# Patient Record
Sex: Female | Born: 1950 | Race: White | Hispanic: No | Marital: Married | Smoking: Never smoker
Health system: Southern US, Community
[De-identification: ages and names within clinical notes are randomized; demographics above are authoritative.]

## PROBLEM LIST (undated history)

## (undated) DIAGNOSIS — T7840XA Allergy, unspecified, initial encounter: Secondary | ICD-10-CM

## (undated) DIAGNOSIS — Z8709 Personal history of other diseases of the respiratory system: Secondary | ICD-10-CM

## (undated) DIAGNOSIS — I499 Cardiac arrhythmia, unspecified: Secondary | ICD-10-CM

## (undated) DIAGNOSIS — R946 Abnormal results of thyroid function studies: Secondary | ICD-10-CM

## (undated) DIAGNOSIS — R112 Nausea with vomiting, unspecified: Secondary | ICD-10-CM

## (undated) DIAGNOSIS — M722 Plantar fascial fibromatosis: Secondary | ICD-10-CM

## (undated) DIAGNOSIS — R011 Cardiac murmur, unspecified: Secondary | ICD-10-CM

## (undated) DIAGNOSIS — J189 Pneumonia, unspecified organism: Secondary | ICD-10-CM

## (undated) DIAGNOSIS — M5030 Other cervical disc degeneration, unspecified cervical region: Secondary | ICD-10-CM

## (undated) DIAGNOSIS — K2270 Barrett's esophagus without dysplasia: Secondary | ICD-10-CM

## (undated) DIAGNOSIS — I2699 Other pulmonary embolism without acute cor pulmonale: Secondary | ICD-10-CM

## (undated) DIAGNOSIS — K219 Gastro-esophageal reflux disease without esophagitis: Secondary | ICD-10-CM

## (undated) DIAGNOSIS — Z9889 Other specified postprocedural states: Secondary | ICD-10-CM

## (undated) DIAGNOSIS — M1990 Unspecified osteoarthritis, unspecified site: Secondary | ICD-10-CM

## (undated) DIAGNOSIS — I480 Paroxysmal atrial fibrillation: Secondary | ICD-10-CM

## (undated) DIAGNOSIS — D649 Anemia, unspecified: Secondary | ICD-10-CM

## (undated) DIAGNOSIS — R7303 Prediabetes: Secondary | ICD-10-CM

## (undated) DIAGNOSIS — S62109A Fracture of unspecified carpal bone, unspecified wrist, initial encounter for closed fracture: Secondary | ICD-10-CM

## (undated) DIAGNOSIS — Z8719 Personal history of other diseases of the digestive system: Secondary | ICD-10-CM

## (undated) DIAGNOSIS — Z86010 Personal history of colonic polyps: Secondary | ICD-10-CM

## (undated) DIAGNOSIS — E669 Obesity, unspecified: Secondary | ICD-10-CM

## (undated) DIAGNOSIS — Z5189 Encounter for other specified aftercare: Secondary | ICD-10-CM

## (undated) DIAGNOSIS — I219 Acute myocardial infarction, unspecified: Secondary | ICD-10-CM

## (undated) HISTORY — DX: Fracture of unspecified carpal bone, unspecified wrist, initial encounter for closed fracture: S62.109A

## (undated) HISTORY — DX: Obesity, unspecified: E66.9

## (undated) HISTORY — PX: PLANTAR FASCIA RELEASE: SHX2239

## (undated) HISTORY — DX: Personal history of colonic polyps: Z86.010

## (undated) HISTORY — DX: Paroxysmal atrial fibrillation: I48.0

## (undated) HISTORY — DX: Unspecified osteoarthritis, unspecified site: M19.90

## (undated) HISTORY — DX: Other pulmonary embolism without acute cor pulmonale: I26.99

## (undated) HISTORY — DX: Allergy, unspecified, initial encounter: T78.40XA

## (undated) HISTORY — DX: Abnormal results of thyroid function studies: R94.6

## (undated) HISTORY — DX: Barrett's esophagus without dysplasia: K22.70

## (undated) HISTORY — DX: Plantar fascial fibromatosis: M72.2

## (undated) HISTORY — PX: PULMONARY EMBOLISM SURGERY: SHX752

## (undated) HISTORY — DX: Cardiac murmur, unspecified: R01.1

## (undated) HISTORY — PX: OTHER SURGICAL HISTORY: SHX169

## (undated) HISTORY — DX: Anemia, unspecified: D64.9

## (undated) HISTORY — PX: CHOLECYSTECTOMY: SHX55

## (undated) HISTORY — DX: Encounter for other specified aftercare: Z51.89

## (undated) HISTORY — PX: BACK SURGERY: SHX140

## (undated) HISTORY — PX: TONSILLECTOMY: SUR1361

## (undated) HISTORY — DX: Other cervical disc degeneration, unspecified cervical region: M50.30

## (undated) HISTORY — PX: COLONOSCOPY: SHX174

## (undated) HISTORY — PX: ULNAR TUNNEL RELEASE: SHX820

## (undated) HISTORY — PX: VENOUS ABLATION: SHX2656

## (undated) HISTORY — PX: ESOPHAGOGASTRODUODENOSCOPY: SHX1529

## (undated) HISTORY — PX: ABDOMINAL HYSTERECTOMY: SHX81

---

## 1999-07-04 ENCOUNTER — Emergency Department (HOSPITAL_COMMUNITY): Admission: EM | Admit: 1999-07-04 | Discharge: 1999-07-04 | Payer: Self-pay | Admitting: Emergency Medicine

## 2002-03-01 ENCOUNTER — Ambulatory Visit (HOSPITAL_COMMUNITY): Admission: RE | Admit: 2002-03-01 | Discharge: 2002-03-01 | Payer: Self-pay | Admitting: *Deleted

## 2002-03-01 ENCOUNTER — Encounter (INDEPENDENT_AMBULATORY_CARE_PROVIDER_SITE_OTHER): Payer: Self-pay | Admitting: *Deleted

## 2002-03-01 DIAGNOSIS — Z86010 Personal history of colonic polyps: Secondary | ICD-10-CM

## 2002-03-01 DIAGNOSIS — Z860101 Personal history of adenomatous and serrated colon polyps: Secondary | ICD-10-CM

## 2002-03-01 HISTORY — DX: Personal history of colonic polyps: Z86.010

## 2002-03-01 HISTORY — DX: Personal history of adenomatous and serrated colon polyps: Z86.0101

## 2002-03-24 ENCOUNTER — Other Ambulatory Visit: Admission: RE | Admit: 2002-03-24 | Discharge: 2002-03-24 | Payer: Self-pay | Admitting: *Deleted

## 2002-09-02 ENCOUNTER — Encounter: Payer: Self-pay | Admitting: Gynecology

## 2002-09-05 ENCOUNTER — Ambulatory Visit: Admission: RE | Admit: 2002-09-05 | Discharge: 2002-09-05 | Payer: Self-pay | Admitting: Gynecology

## 2002-12-09 ENCOUNTER — Encounter (INDEPENDENT_AMBULATORY_CARE_PROVIDER_SITE_OTHER): Payer: Self-pay | Admitting: Specialist

## 2002-12-09 ENCOUNTER — Encounter: Payer: Self-pay | Admitting: General Surgery

## 2002-12-09 ENCOUNTER — Inpatient Hospital Stay (HOSPITAL_COMMUNITY): Admission: RE | Admit: 2002-12-09 | Discharge: 2002-12-12 | Payer: Self-pay | Admitting: General Surgery

## 2002-12-15 ENCOUNTER — Encounter: Payer: Self-pay | Admitting: *Deleted

## 2002-12-15 ENCOUNTER — Observation Stay (HOSPITAL_COMMUNITY): Admission: AD | Admit: 2002-12-15 | Discharge: 2002-12-16 | Payer: Self-pay | Admitting: *Deleted

## 2002-12-16 ENCOUNTER — Encounter (INDEPENDENT_AMBULATORY_CARE_PROVIDER_SITE_OTHER): Payer: Self-pay | Admitting: Specialist

## 2003-01-06 ENCOUNTER — Encounter: Payer: Self-pay | Admitting: Gynecology

## 2003-01-06 ENCOUNTER — Ambulatory Visit (HOSPITAL_COMMUNITY): Admission: RE | Admit: 2003-01-06 | Discharge: 2003-01-06 | Payer: Self-pay | Admitting: Gynecology

## 2003-03-24 ENCOUNTER — Other Ambulatory Visit: Admission: RE | Admit: 2003-03-24 | Discharge: 2003-03-24 | Payer: Self-pay | Admitting: Gynecology

## 2003-05-01 ENCOUNTER — Emergency Department (HOSPITAL_COMMUNITY): Admission: EM | Admit: 2003-05-01 | Discharge: 2003-05-01 | Payer: Self-pay | Admitting: *Deleted

## 2003-05-26 ENCOUNTER — Encounter: Admission: RE | Admit: 2003-05-26 | Discharge: 2003-05-26 | Payer: Self-pay | Admitting: Sports Medicine

## 2003-06-16 ENCOUNTER — Encounter: Admission: RE | Admit: 2003-06-16 | Discharge: 2003-06-16 | Payer: Self-pay | Admitting: Sports Medicine

## 2003-07-07 ENCOUNTER — Encounter: Admission: RE | Admit: 2003-07-07 | Discharge: 2003-07-07 | Payer: Self-pay | Admitting: Sports Medicine

## 2004-01-16 ENCOUNTER — Inpatient Hospital Stay (HOSPITAL_COMMUNITY): Admission: RE | Admit: 2004-01-16 | Discharge: 2004-01-21 | Payer: Self-pay | Admitting: Neurosurgery

## 2004-01-24 ENCOUNTER — Observation Stay (HOSPITAL_COMMUNITY): Admission: EM | Admit: 2004-01-24 | Discharge: 2004-01-25 | Payer: Self-pay | Admitting: Emergency Medicine

## 2004-05-03 ENCOUNTER — Encounter: Admission: RE | Admit: 2004-05-03 | Discharge: 2004-08-01 | Payer: Self-pay | Admitting: Neurosurgery

## 2005-10-11 ENCOUNTER — Encounter: Admission: RE | Admit: 2005-10-11 | Discharge: 2005-10-11 | Payer: Self-pay | Admitting: Family Medicine

## 2006-01-18 ENCOUNTER — Encounter: Admission: RE | Admit: 2006-01-18 | Discharge: 2006-01-18 | Payer: Self-pay | Admitting: Family Medicine

## 2006-07-28 ENCOUNTER — Ambulatory Visit: Payer: Self-pay | Admitting: Vascular Surgery

## 2006-08-03 ENCOUNTER — Ambulatory Visit: Payer: Self-pay | Admitting: Vascular Surgery

## 2006-08-25 ENCOUNTER — Ambulatory Visit: Payer: Self-pay | Admitting: Vascular Surgery

## 2006-08-31 ENCOUNTER — Ambulatory Visit: Payer: Self-pay | Admitting: Vascular Surgery

## 2006-10-05 ENCOUNTER — Ambulatory Visit: Payer: Self-pay | Admitting: Vascular Surgery

## 2007-01-07 ENCOUNTER — Encounter: Admission: RE | Admit: 2007-01-07 | Discharge: 2007-01-07 | Payer: Self-pay | Admitting: Orthopaedic Surgery

## 2007-01-19 ENCOUNTER — Inpatient Hospital Stay (HOSPITAL_COMMUNITY): Admission: RE | Admit: 2007-01-19 | Discharge: 2007-01-24 | Payer: Self-pay | Admitting: Orthopaedic Surgery

## 2007-02-15 ENCOUNTER — Encounter: Admission: RE | Admit: 2007-02-15 | Discharge: 2007-05-05 | Payer: Self-pay | Admitting: Orthopaedic Surgery

## 2008-06-30 ENCOUNTER — Ambulatory Visit: Payer: Self-pay | Admitting: Vascular Surgery

## 2008-09-29 ENCOUNTER — Ambulatory Visit: Payer: Self-pay | Admitting: Vascular Surgery

## 2008-12-12 ENCOUNTER — Encounter: Admission: RE | Admit: 2008-12-12 | Discharge: 2008-12-12 | Payer: Self-pay | Admitting: Orthopaedic Surgery

## 2009-02-16 ENCOUNTER — Encounter: Admission: RE | Admit: 2009-02-16 | Discharge: 2009-02-16 | Payer: Self-pay | Admitting: Orthopaedic Surgery

## 2009-07-31 ENCOUNTER — Ambulatory Visit: Payer: Self-pay | Admitting: General Practice

## 2010-09-03 NOTE — Assessment & Plan Note (Signed)
OFFICE VISIT   [name], [name]  DOB:  1950/08/27                                       10/05/2006  ZOXWR#:[id number]   The patient presents today for final followup of her staged bilateral  laser ablation in great saphenous vein.  She is pleased with her overall  results.  She reports that she has less discomfort at the end of a day  and is able to see her ankles for the first time in quite some time.  She does have some tenderness over the medial aspect of her calf  bilaterally, and reports that she has had sensation of what she  describes as reticular vein in the past, but these are no longer  visualized by her.   On physical exam, has complete resolution of all her bruising and does  have diminished swelling.  She is encouraged to wear compression  garments when possible, and will see [place] on an as-needed basis.   Larina Earthly, M.D.  Electronically Signed   TFE/MEDQ  D:  10/05/2006  T:  10/06/2006  Job:  [id number]   cc:   Caryn Bee L. Little, M.D.

## 2010-09-03 NOTE — Procedures (Signed)
LOWER EXTREMITY VENOUS REFLUX EXAM   INDICATION:  Left leg superficial thrombophlebitis.  Previous bilateral  greater saphenous vein ablation.   EXAM:  Using color-flow imaging and pulse Doppler spectral analysis, the  right and left common femoral, superficial femoral, popliteal, posterior  tibial, greater and lesser saphenous veins are evaluated.  There is no  evidence suggesting deep venous insufficiency in the left lower  extremity.  There is mild reflux seen in the right common femoral vein.   The left saphenofemoral junction is not competent.  The left GSV  anterior branch is not competent with the caliber as described below.   The left proximal short saphenous vein demonstrates competency.   GSV Diameter (used if found to be incompetent only)                                            Right    Left  Proximal Greater Saphenous Vein           cm       1.1 cm  Proximal-to-mid-thigh                     cm       0.6 cm  Mid thigh                                 cm       0.6 cm  Mid-distal thigh                          cm       0.5 cm  Distal thigh                              cm       0.5 cm  Knee                                      cm       0.5 cm   IMPRESSION:  1. Left greater saphenous vein reflux is identified with the caliber      ranging from 1.1 cm to 0.5 cm knee to groin.  2. The left greater saphenous vein is not aneurysmal.  3. The left greater saphenous vein is not tortuous.  4. The deep venous system is competent.  5. The left lesser saphenous vein is competent.  6. At the mid to distal thigh the left greater saphenous vein anterior      branch divides into two branches.  Both branches are incompetent.      The anterior branch connects to the thrombotic varicose veins in      the anterior aspect of the leg just distal to the knee.   ___________________________________________  Larina Earthly, M.D.   MC/MEDQ  D:  06/30/2008  T:  06/30/2008   Job:  [id number]

## 2010-09-03 NOTE — Assessment & Plan Note (Signed)
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  She is known to me from staged bilateral greater saphenous vein laser  ablation/stab phlebectomy in April and May of 2008.  She over the past  one month has had a recurrent episode of erythema and pain below her  left knee.  She does have degenerative knee disease and had undergone a  prior right leg knee replacement x2.  She was placed on antibiotic and  saw an orthopedic physician, who felt this was a venous pathology.  She  is seeing [place] today for further evaluation.   She does continue to have some recurrent heaviness and pressure  sensation in her left calf and knee.  She does have pain and tenderness  over the area of superficial thrombophlebitis in the below-knee  position.  Her health is otherwise unchanged from before.   PHYSICAL EXAMINATION:  Blood pressure 126/83, pulse 66, respirations 18.  She does not have any significant tributary varicosities.  She does have  tenderness over the medial below-knee calf extending onto the pretibial  area.   She underwent formal venous duplex.  This shows ablation of her greater  saphenous vein from her knee to her groin bilaterally.  She does have an  anterior branch of the left saphenous.  This is patent and refluxing.  It does extending into the symptomatic tributary varicosities with the  partial thrombus in the superficial veins.   I had a long discussion with the patient.  I explained that this does  not preclude her to DVT.  I do feel that her symptoms are related to  this and that she is having recurrent episodes of superficial  thrombophlebitis secondary to this anterior saphenous branch.  She has  not worn her compression garments  since her initial ablation 2 years  ago; therefore, we will get her a prescription for panty-style 20 to 30  mm compression garments.  We will see her again in 3 months to determine  if this is effective for her.   Larina Earthly, M.D.  Electronically Signed   TFE/MEDQ  D:  06/30/2008  T:  07/03/2008  Job:  [id number]   cc:   Lillia Carmel, M.D.

## 2010-09-03 NOTE — Assessment & Plan Note (Signed)
OFFICE VISIT   [name], [name]  DOB:  [birth year]                                       09/29/2008  JYNWG#:[id number]   The patient presents today for evaluation of her lower extremity  discomfort and venous pathology.  She reports that she was unable to  wear her panty style compression garments.  She reports this makes her  pain worse and also with her knee replacement on the right very  difficult for her to get it on.  She continues to have bilateral lower  extremity discomfort and swelling.  This is throughout from the knees  distally on both sides.  I reviewed her prior duplex with her.  This  shows no reflux in her left deep system and mild reflux at her common  femoral vein on the right.  She has had prior ablation of her great  saphenous veins bilaterally and this had been persistent.  The only  abnormality we could find was an anterior branch of her saphenous vein  on the left that was feeding into tributary varicosities at her left  lateral knee that she had suffered superficial thrombophlebitis in.  The  thrombophlebitis has completely resolved.  On handheld duplex she does  have an accessory branch that is refluxing into this area.   I had a very long discussion with the patient.  I explained that we  could proceed with ablation of this anterior branch and stab phlebectomy  of the varicosities on her lateral left knee.  I am not sure that this  would take care of her major symptoms which are bilateral total leg  aching sensation and also swelling.  She does not have any correctable  issues on her right leg and simply this anterior branch on the left leg.  I explained that if she had recurrent episodes of superficial  thrombophlebitis that we would certainly recommend treatment at that  time but with her not having any recurrent episodes of thrombophlebitis  I am not sure she would achieve enough benefit from the ablation to the  anterior  branch since she is having bilateral pain.  She understands  this and will continue to watch conservatively.  She understands she is  a candidate for ablation of the anterior saphenous branch and stab  phlebectomy should this progress.  She will notify us should she wish to  proceed.   Larina Earthly, M.D.  Electronically Signed   TFE/MEDQ  D:  09/29/2008  T:  09/29/2008  Job:  [id number]   cc:   Lillia Carmel, M.D.

## 2010-09-03 NOTE — Discharge Summary (Signed)
NAMESANTANNA, [name]             ACCOUNT NO.:  [id number]   MEDICAL RECORD NO.:  [id number]          PATIENT TYPE:  INP   LOCATION:  1615                         FACILITY:  [facility]   PHYSICIAN:  Vanita Panda. Magnus Ivan, M.D.DATE OF BIRTH:  1950/09/18   DATE OF ADMISSION:  01/19/2007  DATE OF DISCHARGE:  01/24/2007                               DISCHARGE SUMMARY   ADMITTING DIAGNOSIS:  Severe osteoarthritis, right knee.   DISCHARGE DIAGNOSIS:  Severe osteoarthritis, right knee.   PROCEDURES:  Right total knee arthroplasty on January 19, 2007.   HOSPITAL COURSE:  Briefly, [name] is a 60 year old female with  known severe degenerative arthritis involving her right knee.  She had  failed conservative nonoperative treatment with physical therapy  injections and nonsteroidal anti-inflammatory medications due to the  debilitating nature of her pain and the effect of her activities of  daily living.  She would like to proceed with total knee replacement  surgery of her right knee.  She was taken to the operating room on the  day of admission where she underwent a right total knee arthroplasty  without complications.  For detailed description of the operation,  please refer to the dictated operative note in the patient's medical  record.  Postoperatively she was on standard protocol for recovery from  total knee replacement.  Her hospital convalescence was uncomplicated,  and by the day of discharge she was normalizing on her INR for DVT  prophylaxis.  She is working with physical therapy with weightbearing as  tolerated and range of motion of her knee.  She was tolerating oral diet  as well as oral pain medication.  On the day of discharge, her knee  incision was found to be clean, dry, and intact, and therapy had cleared  her for discharge to home with home health therapy and blood draws for  her INR.   DISPOSITION:  To home.   DISCHARGE INSTRUCTIONS:  While she is at home,  she will continue  weightbearing as tolerated and work on mobilization and range of motion  of the knee.  Home health therapy will also have lab draws performed for  dosing her Coumadin.   DISCHARGE MEDICATIONS:  1. Percocet.  2. Robaxin.  3. Coumadin.      Vanita Panda. Magnus Ivan, M.D.  Electronically Signed     CYB/MEDQ  D:  02/07/2007  T:  02/08/2007  Job:  [id number]

## 2010-09-03 NOTE — Op Note (Signed)
[name], [name]             ACCOUNT NO.:  [id number]   MEDICAL RECORD NO.:  [id number]          PATIENT TYPE:  INP   LOCATION:  0002                         FACILITY:  [facility]   PHYSICIAN:  [name], M.D.DATE OF BIRTH:  1951/02/20   DATE OF PROCEDURE:  01/19/2007  DATE OF DISCHARGE:                               OPERATIVE REPORT   PREOPERATIVE DIAGNOSIS:  Right knee severe degenerative joint disease.   POSTOPERATIVE DIAGNOSIS:  Right knee severe degenerative joint disease.   PROCEDURE:  Right total knee arthroplasty.   COMPONENTS:  DePuy rotating platform knee with size 2 femur, size 2.5  tibia, 10 mm polyethylene insert and 35-mm patellar button.   SURGEON:  Doneen Poisson, MD   ANESTHESIA:  General.   ANTIBIOTICS:  2 grams of IV Ancef.   TOURNIQUET TIME:  2 hours 13 minutes.   BLOOD LOSS:  100 mL.   COMPLICATIONS:  None.   INDICATIONS:  Briefly [name] is 60 year old female with severe  degenerative joint disease of her right knee.  She is a morbidly obese  individual and just over 5 feet tall, weighs over 250 pounds.  Due to  severe debilitating nature of her disease.  I tried conservative  treatment with injections, she has had knee scopes in the past and after  continued severe arthritis in her knee that was affected her activities  of daily living she wished to proceed with total knee replacement.  I  told her this would be quite difficult given her size and she wanted me  to still proceed with surgery.  I decided to perform computer assisted  surgery with navigation to help with the component placement, given her  size and difficulty with assessment of alignment.  Risks, benefits  surgery explained her, well understood, she agreed to proceed with  surgery.   DESCRIPTION OF PROCEDURE:  After informed consent, appropriate right leg  was marked.  She is brought to the operating room, placed supine on the  operating table.  General  anesthesia was then obtained, a nonsterile  tourniquet placed around her upper right thigh.  Her leg was prepped and  draped with DuraPrep and sterile drapes including sterile stockinette.  An Esmarch was used to wrap out the leg and tourniquet inflated to 350  mmHg pressure.  Time-out was called.  She was identified as the correct  patient, correct extremity.  I then made a direct incision over the  patella and carried this proximal and distally, took the incision down  to the soft tissues and then took a medial, parapatellar approach.  Due  to her obesity, I went ahead and made a patella cut and after measuring  the patella, I took about 14 mm off this and was able to size for size  35 patellar button and add back 8 mm.  Next I did soft tissue removal of  osteophytes at the PCL, the ACL and both menisci and released tissue  immediately.  Following this and navigation portion of the case was  proceeded with using [facility], two Steinmann pins placed in the tibia  from an  anterior medial to posterior lateral direction and two pins were  placed in the femur for an anterior medial to posterior lateral infrared  array gloves were placed on both the femur and tibia and navigation  portion of the case was taken.  Once the knee was navigated I was able  to make a tibial cut using cutting guide.  I took 10 mm off the high  side and 8 mm off the low side.  Once this was done I was able to  perform more soft tissue balancing to balance my flexion/extension gaps.  The distal femoral cut was then made followed by fine chamfer cuts and I  chose a size 2 femoral trial followed by 2.5 tibial trial and the tibia  was prepared as well.  Once these components were placed in the  balancing with varus, valgus and flexion extension gaps was obtained and  maintained, I placed 10 mm trial insert and put the knee through a range  of motion.  This found to be stable under navigation found to have  normal anatomic  alignment.  All components were then removed and I  prepared the patella again to see if this tracked well and it did.  I  then let tourniquet down 2 hours 15 minutes.  Tourniquet was up for  quite some time, considering the difficulty with blunt dissection and  preparing the soft tissues and navigating the case as well.  The  tourniquet was let down.  I used electrocautery to maintain hemostasis.  I then mixed the cement and was able to cement the tibial trial followed  by the femoral trial, the femoral button.  The 10 mm polyethylene insert  was then placed and I put the knee in extended position, allowed the  cement to dry.  Then I removed excess cement.  The knee was the cleaned  with pulsatile lavage and normal saline solution.  I then closed the  arthrotomy with interrupted #1 Ethilon suture followed by 0 Vicryl in  the deep tissue and 2-0 Vicryl in the subcutaneous tissue.  The skin was  closed with staples.  Adaptic followed by well padded sterile dressing  were applied.  The patient was awakened, extubated, taken to recovery  room in stable condition.  There no complication noted and final blood  loss was approximately 200 mL.      [name], M.D.  Electronically Signed     CYB/MEDQ  D:  01/19/2007  T:  01/20/2007  Job:  [id number]

## 2010-09-03 NOTE — H&P (Signed)
[name], [name]             ACCOUNT NO.:  [id number]   MEDICAL RECORD NO.:  [id number]          PATIENT TYPE:  INP   LOCATION:  1615                         FACILITY:  [facility]   PHYSICIAN:  Vanita Panda. Magnus Ivan, M.D.DATE OF BIRTH:  08-07-1950   DATE OF ADMISSION:  01/19/2007  DATE OF DISCHARGE:  01/24/2007                              HISTORY & PHYSICAL   CHIEF COMPLAINT:  Right knee pain, known severe degenerative joint  disease.   HISTORY OF PRESENT ILLNESS:  Briefly, [name] is a 60 year old  female with known severe degenerative arthritis involving her right  knee.  We have tried injections as well as physical therapy and anti-  inflammatory medications to try to improve her life.  Her activities of  daily living have certainly been worsening with more difficulty being  able to mobilize.  It was recommend she undergo a total knee  arthroplasty.  The risks and benefits of this were explained her and  well understood.  She agreed to proceed with surgery.  The biggest  concern is her morbid obesity.   PAST MEDICAL HISTORY:  1. Right knee osteoarthritis.  2. Morbid obesity.  3. Reflux.  4. Mild hearing loss.   ALLERGIES:  NO KNOWN DRUG ALLERGIES.   SOCIAL HISTORY:  She does live with her husband at home.   MEDICATIONS:  See med req listing of medications which include Nexium,  Advil and Hydrocodone.   REVIEW OF SYSTEMS:  Negative for chest pain, shortness breath, fever,  chills, nausea, vomiting.   PHYSICAL EXAMINATION:  VITAL SIGNS:  She is afebrile.  Stable vital  signs.  GENERAL:  She is alert and oriented in no acute distress.  Without any  discomfort.  HEENT: Normocephalic, atraumatic.  Pupils equal round reactive light.  NECK:  Supple with JVD or bruits.  LUNGS:  Clear to auscultation bilaterally.  HEART:  Regular rate and rhythm.  ABDOMEN:  Morbidly obese.  EXTREMITIES:  Right knee with full range of motion but considerable  amount of pain in a  varus deformity.   STUDIES:  X-rays noted a severe varus deformity of her right knee with  bone on bone wear.   IMPRESSION:  This is a 60 year old female with debilitating  osteoarthritis involving her right knee that is affecting her activities  of daily living.   PLAN:  We will proceed with total knee arthroplasty.  Risks and benefits  of this have been well explained to her as well as the risk of DVT and  fatal PE.      Vanita Panda. Magnus Ivan, M.D.  Electronically Signed     CYB/MEDQ  D:  02/07/2007  T:  02/08/2007  Job:  [id number]

## 2010-09-06 NOTE — H&P (Signed)
NAME:  [name], [name]                       ACCOUNT NO.:  [id number]   MEDICAL RECORD NO.:  [id number]                   PATIENT TYPE:  INP   LOCATION:  NA                                   FACILITY:  [facility]   PHYSICIAN:  Timothy P. Fontaine, M.D.           DATE OF BIRTH:  28-May-1950   DATE OF ADMISSION:  12/09/2002  DATE OF DISCHARGE:                                HISTORY & PHYSICAL   CHIEF COMPLAINT:  Irregular bleeding.   HISTORY OF PRESENT ILLNESS:  A 60 year old G44, P3, AB1 female status post  tubal sterilization who has a history of irregular bleeding.  The patient's  evaluation included ultrasonography which showed a 6 cm leiomyomata  encroaching on the cavity with overall uterine size 13 x 10.5 x 6 cm.  An  endometrial biopsy was performed showing disordered proliferative  endometrium and she had normal thyroid function studies and prolactins.  Her  hemoglobin was 11.6.  She has two to three week menses that last seven days  and they are progressively getting worse.  The patient was placed on  progesterone withdrawal and she continued to have irregular bleeding and she  is admitted for total abdominal hysterectomy.   PAST MEDICAL HISTORY:  1. Arthritis.  2. Hiatal hernia.  3. Iron deficient anemia.  4. History of questionable thrombophlebitis of the left superficial thigh     that she actually was placed on oral Coumadin for.   CURRENT MEDICATIONS:  1. Arthrotec.  2. Nexium.   PAST SURGICAL HISTORY:  1. Tonsillectomy.  2. Cesarean section with tubal sterilization.  3. Heel spur removal.  4. Knee orthopedic surgery.   ALLERGIES:  No known medications.   REVIEW OF SYSTEMS:  Noncontributory.   SOCIAL HISTORY:  Noncontributory.   FAMILY HISTORY:  Noncontributory.   PHYSICAL EXAMINATION:  VITAL SIGNS:  Afebrile.  Vital signs are stable.  HEENT:  Normal.  LUNGS:  Clear.  CARDIAC:  Regular rate without rubs, murmurs, or gallops.  ABDOMEN:  Benign.  PELVIC:   External, BUS, vagina grossly normal.  Cervix normal.  Uterus  difficult to palpate.  No gross masses or acute changes.   ASSESSMENT:  A 60 year old G16, P55, AB1 female with tubal sterilization who  desires total abdominal hysterectomy due to continued irregular bleeding  with leiomyomata.  I reviewed with the patient and her husband the expected  intraoperative/postoperative courses and the risks associated with the  surgery.  Ovarian conservation issue was discussed with the patient and the  options for removing both ovaries versus keeping both ovaries were  discussed.  Given the patient's age of 35, my recommendation was to remove  both ovaries and the issues of hormonal replacement therapy were reviewed  with her.  The patient relates that in her family the women undergo  menopause at age 25 as a history and that she desires to keep both ovaries  because she is very fearful of the  issues of hormonal replacement therapy  and she would rather keep her ovaries and accept the risks of ovarian cancer  and ovarian problems in the future.  I discussed this issue with her on  several different occasions and she is very adamant about keeping both  ovaries, although she does give me final permission to remove one or both  ovaries if, in my judgment, there is significant disease and that this  should be performed.  I also reviewed with her the alternatives such as  expectant management, hormonal manipulation, and surgical manipulation such  as myomectomy versus hysterectomy and she wants to proceed with  hysterectomy.  Sexuality following hysterectomy was also discussed with her  and the risks of orgasmic dysfunction as well as persistent dyspareunia  following hysterectomy was discussed, understood, and accepted.  The  absolute sterility of hysterectomy was also reviewed with her.  The risks of  infection, both internal requiring prolonged antibiotics, abscess formation  requiring reoperation,  drainage of abscesses, as well as wound complications  including seromas, hematomas, infections, opening and closure by secondary  intention was all discussed, understood, and accepted.  The risks of  transfusion were reviewed to include transfusion reaction, hepatitis, HIV,  Mad Cow Disease, and other unknown entities.  She initially wanted her son  to do a direct donor and then subsequently changed her mind and will accept  heterologous blood transfusion if necessary.  Risks of inadvertent injury to  internal organs including bowel, bladder, ureters, vessels, and nerves  necessitating major exploratory reparative surgeries and future reparative  surgeries including ostomy formation was all discussed, understood, and  accepted.  I also reviewed with the patient the anatomical issues and due to  her obesity that we may need to proceed with a supracervical hysterectomy if  there is difficulty intraoperatively removing the cervix and if at my  judgment the most prudent course is to proceed with a supracervical  hysterectomy.  She understands and accepts this possibility.  Lastly, I  reviewed with her she does have a history of what appears to be a  superficial thrombophlebitis in her thigh and was placed on Coumadin therapy  and apparently had received Lovenox injections with a prior surgery  prophylactically and I think the most prudent thing would be to cover her  for a week with subcutaneous Lovenox as a prophylaxis dosed at 40 mg daily  and will do this preoperatively and for the week following surgery.  She  will also have PAS thigh high stockings but she understands that there is a  risk of deep venous thrombosis, pulmonary embolus, and death associated with  surgery and she understands and accepts this possibility.  The patient's  questions are answered to her satisfaction and she is ready to proceed with  surgery.                                              Timothy P. Audie Box,  M.D.    TPF/MEDQ  D:  12/08/2002  T:  12/08/2002  Job:  [id number]

## 2010-09-06 NOTE — H&P (Signed)
NAME:  [name], [name]                       ACCOUNT NO.:  [id number]   MEDICAL RECORD NO.:  [id number]                   PATIENT TYPE:  OBV   LOCATION:  0478                                 FACILITY:  [facility]   PHYSICIAN:  Althea Grimmer. Luther Parody, M.D.            DATE OF BIRTH:  [birth year]   DATE OF ADMISSION:  12/15/2002  DATE OF DISCHARGE:                                HISTORY & PHYSICAL   HISTORY OF PRESENT ILLNESS:  Ms. Gemmell is a 60 year old obese female who  is admitted for observation tonight, status post ERCP with sphincterotomy  and removal of gallstone debris from the common bile duct.  She underwent a  combined cholecystectomy and hysterectomy approximately a week ago and a  retained stone was seen on intraoperative cholangiogram.  She has been  having some epigastric pain, but it does not sound like she has had any  recent biliary colic.  Today she underwent difficult, but successful ERCP  with sphincterotomy and removal of stone debris.  No large stone was seen.  She is admitted overnight for observation.  Today preceding the ERCP, she  also underwent an upper endoscopy with surveillance biopsies of her known  Barrett's esophagitis.   PAST MEDICAL HISTORY:  1. Barrett's esophagitis.  2. Hiatal hernia, 5 cm.  3. Osteoarthritis.  4. Past history of iron deficiency.   CURRENT MEDICATIONS:  Arthrotec and Nexium.   PAST SURGICAL HISTORY:  1. Tonsillectomy.  2. Cesarean section.  3. Hysterectomy.  4. Tubal ligation.  5. Heel spur removal.  6. Knee surgery.  7. Colonoscopy with removal of an adenomatous polyp in 2003 with followup     advised in November of 2006.   ALLERGIES:  None reported, however, today at the time of the ERCP, the  patient received ciprofloxacin and seemed to have a rash on the arm with  discomfort near the IV, so it was discontinued.   SOCIAL HISTORY:  Nonsmoker.  Nondrinker.   FAMILY HISTORY:  Several relatives have had gallstones.  Her  grandmother had  colon cancer.   REVIEW OF SYSTEMS:  As above.   PHYSICAL EXAMINATION:  GENERAL APPEARANCE:  She is in no acute distress.  VITAL SIGNS:  Afebrile, blood pressure 180/100, pulse 96.  SKIN:  Normal.  HEENT:  Eyes anicteric.  Oropharynx unremarkable.  NECK:  Supple without thyromegaly.  LYMPHATICS:  There is no significant cervical or inguinal adenopathy.  CHEST:  Clear.  HEART:  Heart sounds distant.  Regular rate and rhythm.  ABDOMEN:  Obese with healing scars.  EXTREMITIES:  Trace edema.   IMPRESSION:  Status post endoscopic retrograde cholangiopancreatography,  admitted for observation to make certain she does not develop pancreatitis  since this was quite a difficult procedure.  Please see the orders.  Althea Grimmer. Luther Parody, M.D.    PJS/MEDQ  D:  12/15/2002  T:  12/15/2002  Job:  [id number]   cc:   Ollen Gross. Vernell Morgans, M.D.  [address]  [place]  [place] [address]  Fax: [phone]   Anna Genre. Little, M.D.  [address]  [place]  [place] [address]  Fax: [phone]

## 2010-09-06 NOTE — Discharge Summary (Signed)
NAME:  [name], [name]                       ACCOUNT NO.:  [id number]   MEDICAL RECORD NO.:  [id number]                   PATIENT TYPE:  INP   LOCATION:  0447                                 FACILITY:  [facility]   PHYSICIAN:  Timothy P. Fontaine, M.D.           DATE OF BIRTH:  October 28, 1950   DATE OF ADMISSION:  12/09/2002  DATE OF DISCHARGE:  12/12/2002                                 DISCHARGE SUMMARY   DISCHARGE DIAGNOSES:  1. Leiomyomata.  2. Chronic cholecystitis with cholelithiasis.  3. Endometrial polyp.   PROCEDURE:  Laparoscopic cholecystectomy with intraoperative cholangiogram,  total abdominal hysterectomy, December 09, 2002.   PATHOLOGY:  [id number] gallbladder chronic cholecystitis with cholelithiasis.  Uterus hysterectomy, benign cervix with nabothian cyst, benign proliferative  endometrium associated with small benign endometrial polyp, leiomyomata with  degenerative changes, total weight 370 g.   HOSPITAL COURSE:  The patient was admitted and underwent the above procedure  on December 09, 2002. She did well postoperatively noting that she did have a  common bile duct stone noted on the IOC and made arrangements for an ERCP  per gastroenterology after discharge. She was discharged on August 23  ambulating well, tolerating a regular diet with planned followup with  general surgery from her cholecystectomy, planned followup with  gastroenterology for ERCP and planned followup with gynecology for removal  of staples as an outpatient. Precautions, instructions and followup were  given to the patient. She received Tylox #25 for pain. She was continued on  Lovenox 40 mg subcu daily for a 10 day course due to her history of  thrombophlebitis in the past and will be seen as an outpatient per above  arrangements.                                               Timothy P. Audie Box, M.D.    TPF/MEDQ  D:  01/06/2003  T:  01/07/2003  Job:  [id number]

## 2010-09-06 NOTE — Consult Note (Signed)
NAME:  [name], [name]                       ACCOUNT NO.:  [id number]   MEDICAL RECORD NO.:  [id number]                   PATIENT TYPE:  INP   LOCATION:  0447                                 FACILITY:  [facility]   PHYSICIAN:  Petra Kuba, M.D.                 DATE OF BIRTH:  [birth year]   DATE OF CONSULTATION:  12/09/2002  DATE OF DISCHARGE:                                   CONSULTATION   HISTORY:  The patient was seen at the request of Dr. Carolynne Edouard for retained CBD  stones.  She had undergone a combined cholecystectomy and hysterectomy, and  on intraoperative cholangiogram a stone was seen.  She has seen Dr.  Luther Parody, my partner, in the past, and has had endoscopic procedures in the  past without problems.   PAST MEDICAL HISTORY:  Pertinent for arthritis, hiatal hernia, iron  deficiency, and possibly a thrombophlebitis in the past.   CURRENT MEDICINES AT HOME:  1. Arthrotec.  2. Nexium.   PAST SURGERIES:  1. Tonsillectomy.  2. Cesarean sections.  3. Tubal ligation.  4. Heel spur removal.  5. Knee surgery.   ALLERGIES:  No known allergies.   SOCIAL HISTORY:  Negative for any alcohol or tobacco.   FAMILY HISTORY:  Pertinent for gallstones in some relatives.  No other  obvious GI problems.   REVIEW OF SYSTEMS:  Pertinent for recovering nicely from her surgery today.   PHYSICAL EXAMINATION:  VITAL SIGNS:  See chart.  GENERAL:  In no acute distress.  Not examined today.  Will be prior to ERCP.   LABORATORY DATA:  Normal.   ASSESSMENT:  Common bile duct stone on intraoperative cholangiogram.   PLAN:  Since she still has a PCA pump and just had surgery, will hold off on  going into specifics about the ERCP today, but will return tomorrow to  discuss it further.  We briefly discussed endoscopic removal would precede  this hospitalization as soon as possible if signs of cholangitis,  pancreatitis, or symptoms coming from the stone, or we can do it electively,  possibly  near the end of her hospitalization, or as an outpatient, to give  her a period of recovery.  I will check on her tomorrow and await recheck  labs, and review her intraoperative cholangiogram.                                               Petra Kuba, M.D.    MEM/MEDQ  D:  12/09/2002  T:  12/09/2002  Job:  [id number]   cc:   Marcial Pacas P. Audie Box, M.D.  [address], [address]  [place]  [place] [address]  Fax: [phone]   Ollen Gross. Vernell Morgans, M.D.  [address]  [place] [address]  Fax: [phone]   Althea Grimmer. Luther Parody, M.D.  [address], [address]  [place]  [place] [address]  Fax: [phone]

## 2010-09-06 NOTE — Op Note (Signed)
NAME:  [name], [name]                       ACCOUNT NO.:  [id number]   MEDICAL RECORD NO.:  [id number]                   PATIENT TYPE:  INP   LOCATION:  0447                                 FACILITY:  [facility]   PHYSICIAN:  Timothy P. Fontaine, M.D.           DATE OF BIRTH:  03-20-1951   DATE OF PROCEDURE:  12/09/2002  DATE OF DISCHARGE:                                 OPERATIVE REPORT   PREOPERATIVE DIAGNOSIS:  Leiomyomata.   POSTOPERATIVE DIAGNOSIS:  Leiomyomata.   OPERATION/PROCEDURE:  Total abdominal hysterectomy.   SURGEON:  Timothy P. Fontaine, M.D.   ASSISTANT:  Rande Brunt. Gottsegen, M.D.   ESTIMATED BLOOD LOSS:  300-400 mL.   ANESTHESIA:  General endotracheal anesthesia.   COMPLICATIONS:  None.   SPECIMENS:  Uterus with cervix.   FINDINGS:  1. Uterus was enlarged, approximately 14 weeks size.  2. Palpable intramural myoma.  3. Right and left fallopian tube segments grossly normal.  4. Right and left ovaries grossly normal.  5. Pelvis without adhesive disease or evidence of endometriosis.   DESCRIPTION OF PROCEDURE:  At the conclusion of her laparoscopic  cholecystectomy per Dr. Carolynne Edouard, the patient having previously been prepared  with abdominal preparation and draped, with an indwelling Foley catheter  performed prior to her cholecystectomy, the abdomen was sharply entered  through a Pfannenstiel incision, achieving adequate hemostasis at all  levels.  Balfour retractor and bladder blade were placed within the  incision, the intestines packed from operative field.  The uterus was  elevated from the pelvis and the right and left round ligaments were  identified and incised using electrocautery.  At the anterior peritoneal  bladder flap was sharply developed.  The right utero-ovarian pedicle was  then identified, isolated, clamped, cut and doubly ligated using 0 Vicryl  suture in a simple stitch followed by suture ligature.  A similar procedure  was carried out on  the other side.  The right uterine vascular pedicle was  then sharply developed, subsequently clamped, cut and doubly ligated using 0  Vicryl suture.  A similar procedure was carried out on the other side.  Due  to the bulk of the uterus interfering with lower uterine cervical  inspection, a supracervical hysterectomy was initially performed, the  cervical stump then grasped with Kochers and elevated for better  visualization.  The vesicouterine plane was sharply developed.  Subsequently  the cervix was progressively freed from its attachments through clamp,  cutting and ligating of the paracervical cardinal ligaments. A right angle  clamp was then placed and on incision the vagina was entered.  The cervix  was excised from the operative field and the left vaginal angle was clamped  with a poker and subsequently, right and left angle sutures were placed  using 0 Vicryl suture and tagged for future reference.  The vagina was then  run using 0 Vicryl sutures in a running interlocking stitch.  Copious  irrigation of  the pelvis showed adequate hemostasis and due to the small  nature of the vaginal opening, no further sutures were placed and the cuff  was left open.  The pedicles were all reinspected and showed adequate  hemostasis, the bowel packing removed, Balfour retractor and bladder blade  removed, and the anterior fascia was reapproximated using 0 Vicryl suture in  a running stitch starting at angle and meeting in the middle.  The  subcutaneous tissues were copiously irrigated, adequate hemostasis was  achieved with electrocautery.  Skin was reapproximated using  staples.  A  pressure dressing was applied due to her Lovenox dosage and the patient  subsequently was awakened without difficulty and taken to the recovery room  in good condition having tolerated the procedure well.                                                Timothy P. Audie Box, M.D.    TPF/MEDQ  D:  12/09/2002  T:   12/09/2002  Job:  [id number]

## 2010-09-06 NOTE — Op Note (Signed)
[name], [name]             ACCOUNT NO.:  [id number]   MEDICAL RECORD NO.:  [id number]          PATIENT TYPE:  INP   LOCATION:  2871                         FACILITY:  [facility]   PHYSICIAN:  Clydene Fake, M.D.  DATE OF BIRTH:  06/01/1950   DATE OF PROCEDURE:  01/16/2004  DATE OF DISCHARGE:                                 OPERATIVE REPORT   PREOPERATIVE DIAGNOSIS:  Lumbar stenosis and unstable spondylolisthesis.   POSTOPERATIVE DIAGNOSIS:  Lumbar stenosis and unstable spondylolisthesis.   OPERATION PERFORMED:  Gill decompressive laminectomy at L5-S1, decompressive  laminectomy at L4-5, posterior lumbar interbody fusion at L5-S1 with  Brantigan interbody cages at L5-S1, nonsegmented pedicle screw fixation L5-  S1 with Expedium pedicle screw system, posterolateral fusion L4 through S1  (two levels), autograft same incision, allograft, Symphony.   SURGEON:  Clydene Fake, M.D.   ASSISTANT:  Hilda Lias, M.D.   ANESTHESIA:  General endotracheal tube.   ESTIMATED BLOOD LOSS:  600 mL.   BLOOD REPLACED:  200 mL of Cell Saver returned.   COMPLICATIONS:  None.   DRAINS:  None.   INDICATIONS FOR PROCEDURE:  The patient is a 60 year old woman who has had  years of back and leg problems.  She has had some epidural injections which  helped for a brief period of time but that is all.  The pain is worse when  she is up standing or walking and any other activity also brings pains on,  pains in her back, radiates down her legs, maybe a little worse on the left  and she has to stop, sit down and rest after a short period of walking.  MRI  and x-ray was done showing spondylolisthesis at L5-S1.  There was a residual  S1-S2 disk, there are just small pieces with severe stenosis at that level  with moderate stenosis at 4-5, mild stenosis at 3-4.  Patient brought in for  decompression and fusion.   DESCRIPTION OF PROCEDURE:  The patient was brought to the operating room and  general anesthesia induced.  The patient was placed in prone position on the  Wilson frame with all pressure points padded.  The patient was prepped and  draped in sterile fashion.  Site of incision was injected with 20 mL of 1%  lidocaine with epinephrine.  Incision was made in the midline, lower lumbar  spine.  Incision taken down to the fascia.  Hemostasis obtained with Bovie  cautery.  The fascia was incised and subperiosteal dissection was down over  the spinous process and lamina and a marker was placed and fluoroscopy was  used showing we were at the 4-5 interspace.  We extended our incision  caudally.  Dissection exposed the transverse processes of L4 through the  lateral sacrum.  We again confirmed position with fluoroscopy and top of S1  and all of L5 was removed and laminectomy facets and pars of L5 for full  Gill decompressive laminectomy.  We removed also the inferior aspect of L4,  a little more on the left side than the right, decompressing the left 4-5  space also.  This was all done with Leksell rongeurs, Kerrison punches and a  high speed drill.  She was extremely tight at the 5-1 level and there was  some significant stenosis at 4-5 also.  When we were finished, we had good  decompression of the central canal and nerve roots.  There was significant  lateral recess stenosis bilaterally at 5-1 especially worse on the left side  as we decompressed this making sure the 5 root coming out its foramen was  opened throughout its course and fairly lateral all the way past the pedicle  in our decompression.  Performing foraminotomies over the S1 root and over  the sacral dura.  All the bone that was removed during the laminectomy was  saved, cleaned of soft tissue and chopped up into small pieces.  This was  then placed through the Symphony system with platelet rich plasma and used  later in the case.  Some allograft was also placed through the Symphony  system and used later in the  case, given a platelet rich plasma and the  ________ bone products.  During drilling, all drilled bone was also saved  for use in fusion later in the case.  Once we had good decompression, we  explored the 5-1 disk space, had good hemostasis with bipolar cauterization,  incised the disk space, performed diskectomy with pituitary rongeurs and  curettes, we distracted the interspace bilaterally up to 11 mm and continued  removing the disk.  Used the various broaches to clean the interspace  getting ready for interbody fusion and used the final cutting broach for 11  high x 9 wide  to remove cartilaginous end plates.  We used curettes to  remove cartilaginous end plate medially and laterally.  Each of the foramina  were opened and removed all disk material.  We packed the interspace with  the autograft Symphony bone mixture, packed a few 11 high x 9 wide mm  Brantigan cages with this same bone mixture and tapped the cages into place  while holding the distraction.  Cages were in their good position  clinically.  Again we checked fluoroscopy, too.  Attention was taken to  posterolateral gutters.  We decorticated the transverse processes of L4-5,  the lateral sacrum and the rest of our autograft, Symphony and allograft.  The bone mixture was then packed in the posterolateral gutters for a two-  level posterolateral fusion from L4 to S1.  ___________  and were found  using fluoroscopy and intraoperative landmarks transverse processes and out  facets, decorticated the area, placed pedicle probe down the pedicle,  starting at L5, checked to make sure we had bony rim all the way around to  tap pedicle.  Again checked with a small probe making sure we had bony rim  all the way around and we placed a pedicle screw.  This was repeated at L5  bilaterally and at S1 bilaterally.  We used 50 mm Expedium screws x 6 wide  at L5, 6 wide x 35 on the left, 40 on the right mm screws at S1.  We again checked  nerve roots and had good decompression.  Rods were placed on the  screw heads.  Locking nuts placed.  We tightened over the nuts and there was  some slight compression over the pedicle screw system and rod.  We tightened  the second nut and this was repeated on the contralateral side.  Final AP  and lateral fluoroscopic images showed good position of  pedicle screws and  rods at L5 and S1 and good position of the cages and good alignment of the  spine.  We again explored the dura.  Hemostasis with bipolar cautery,  Gelfoam and Thrombin.  When we had good decompression, we placed some  Gelfoam over the dura, removed retractors and the paraspinous muscles were  closed with 0 Vicryl interrupted suture.  The fascia was closed with 0  Vicryl interrupted suture and the subcutaneous tissue closed with 0, 2-0 and  3-0 Vicryl interrupted sutures and the skin closed with benzoin and Steri-  Strips.  Dressing was placed.  Patient was placed back in supine position,  awakened from anesthesia and transferred to recovery room in stable  condition.       JRH/MEDQ  D:  01/16/2004  T:  01/16/2004  Job:  [id number]

## 2010-09-06 NOTE — Op Note (Signed)
NAME:  [name], [name]                       ACCOUNT NO.:  [id number]   MEDICAL RECORD NO.:  [id number]                   PATIENT TYPE:  OBV   LOCATION:  0478                                 FACILITY:  [facility]   PHYSICIAN:  Althea Grimmer. Luther Parody, M.D.            DATE OF BIRTH:  May 05, 1950   DATE OF PROCEDURE:  12/15/2002  DATE OF DISCHARGE:                                 OPERATIVE REPORT   PROCEDURE:  ERCP with sphincterotomy and stone extraction.   INDICATIONS:  Common bile duct stone seen on intraoperative cholangiogram at  the time of cholecystectomy last week.  The patient has not had obstructive  signs or symptoms of biliary colic.   PREPARATION:  She is NPO since midnight.   PREPROCEDURE SEDATION:  Her throat was anesthetized with Cetacaine spray and  prior to and during the entire procedure she received a total of 130 mg of  Demerol and 13 mg of Versed intravenously.  Preprocedure, she began to  receive ciprofloxacin 400 mg IV; however, she began to develop a rash and  discomfort in her arm near the IV site, so it was discontinued, and  ceftizoxime 1 g was given instead.  In addition, during the procedure, she  received 1.25 mg of Glucagon.   DESCRIPTION OF PROCEDURE:  Following a routine upper endoscopy for  surveillance biopsies of her known Barrett's esophagitis, the side-viewing  duodenoscope was passed easily through the oropharynx, and the esophagus and  the duodenum was intubated.  The scope was rotated and retroflexed, and the  ampulla was easily visualized.  Using the guidewire-loaded sphincterotome,  the ampulla was cannulated multiple times, but access could only be obtained  to the pancreatic duct.  After several attempts with minimal injections of  the proximal duct, Petra Kuba, M.D. came in to assist and using the same  instrument with considerable bowing, he did enter the common bile duct.  The  guidewire was advanced well past the bifurcation, and the  sphincterotome was  advanced into the duct.  Dye was injected.  No stone was clearly seen.  A  moderate sphincterotomy was performed.  A 12 mm balloon catheter exchange  was performed, and the balloon was passed up the common bile duct, past the  bifurcation.  It was then withdrawn to the bifurcation and inflated and  withdrawn through the common bile duct three times, passing through the  sphincterotomy with minimal resistance.  Minimal stone debris was retrieved.  It was also noted that prior to cannulation, minimal stone debris was seen  coming out of the ampulla spontaneously.  The patient tolerated the  procedure well, though she did have some discomfort.  Films were reviewed  with the radiologist, who agreed that there was no residual stone material  within the common bile duct.  Patient will be observed in recovery for one  hour and due to the length of the procedure and the difficulty  of  cannulation, we are electing to admit her to the hospital overnight for  observation to make certain that she does not have post-ERCP pancreatitis.  Please see the orders.                                               Althea Grimmer. Luther Parody, M.D.    PJS/MEDQ  D:  12/15/2002  T:  12/15/2002  Job:  [id number]   cc:   Ollen Gross. Vernell Morgans, M.D.  [address]  [place]  [place] [address]  Fax: [phone]   Anna Genre. Little, M.D.  [address]  [place]  [place] [address]  Fax: [phone]

## 2010-09-06 NOTE — Op Note (Signed)
NAME:  LEAHANN, WITHERINGTON                       ACCOUNT NO.:  [id number]   MEDICAL RECORD NO.:  [id number]                   PATIENT TYPE:  INP   LOCATION:  0447                                 FACILITY:  [facility]   PHYSICIAN:  Ollen Gross. Vernell Morgans, M.D.              DATE OF BIRTH:  1950/09/07   DATE OF PROCEDURE:  12/09/2002  DATE OF DISCHARGE:                                 OPERATIVE REPORT   PREOPERATIVE DIAGNOSIS:  Gallstones.   POSTOPERATIVE DIAGNOSIS:  Gallstones.   PROCEDURE:  Laparoscopic cholecystectomy with intraoperative cholangiogram.   SURGEON:  Ollen Gross. Carolynne Edouard, M.D.   ASSISTANT:  Gita Kudo, M.D.   ANESTHESIA:  General endotracheal.   DESCRIPTION OF PROCEDURE:  After informed consent was obtained, the patient  was brought to the operating room and placed in the supine position upon the  operating room table.  After adequate induction of general anesthesia, the  patient's abdomen was prepped with Betadine and draped in the usual sterile  manner.  The area above the umbilicus was infiltrated with 0.25% Marcaine.  A small incision was made with a 15 blade knife and this incision was  carried down through the subcutaneous tissue bluntly with the Kelly clamp  and [employer] retractors until the linea alba was identified.  The linea  alba was then incised with a 15 blade knife.  Each side was grasped with  Kocher clamps and elevated anteriorly.  The preperitoneal space was probed  bluntly with a hemostat until the peritoneum was opened and access was  gained to the abdominal cavity.  A 0 Vicryl pursestring stitch was placed in  the fascia surrounding the opening.  A Hasson cannula was placed through the  opening and anchored in place with the previously-placed Vicryl pursestring  stitch.  The abdomen was then insufflated with carbon dioxide without  difficulty.  The patient was placed in a head-up position.  The right upper  quadrant was inspected and the dome of the  gallbladder and liver were  readily identified.  The epigastric region was then infiltrated with 0.25%  Marcaine.  A small incision was made with a 15 blade knife and a 10 mm port  was placed bluntly through this incision into the abdominal cavity under  direct vision.  Sites were then chosen laterally on the right side of the  abdomen for placement of the 5 mm ports.  Each of these areas was  infiltrated with 0.25% Marcaine.  Small stab incisions were made with a 15  blade knife and 5 mm ports were placed bluntly through these incisions into  the abdominal cavity under direct vision.  A blunt grasper was placed  through the lateralmost 5 mm port and was used to grasp the dome of the  gallbladder and elevate it anteriorly and superiorly.  Another blunt grasper  was placed through the other 5 mm port and used to retract on the  body and  neck of the gallbladder.  A dissector was placed through the upper midline  port and using the electrocautery, the peritoneal reflection of the  gallbladder neck was opened.  Blunt dissection was then carried out in this  area until the gallbladder neck-cystic duct junction was readily identified.  The anatomy seemed fairly abnormal, and much care and precision was taken  with the dissection in this place.  Once the gallbladder neck-cystic duct  junction was definitively identified, a single clip was placed on the  gallbladder neck.  A small ductotomy was made.  A 14-gauge Angiocath was  placed percutaneously through the anterior abdominal wall under direct  vision.  A Reddick cholangiogram catheter was placed through the Angiocath  and flushed.  The Reddick catheter was then placed within the cystic duct  and anchored in place with a clip.  A cholangiogram was obtained, and it  showed that the cystic duct was actually coming off high on the right  hepatic and there was a stone at the confluence of the right and left  hepatic ducts that was not obstructing.   There was good emptying into the  duodenum and the cystic duct seemed to be very short, but there was adequate  length to place a clip upon the cystic duct.  At this point one attempt was  made to pass the Reddick catheter distally, which it was unable to do.  The  Reddick catheter and anchoring clip and Angiocath were then all removed from  the patient.  The anchoring clip was removed prior to trying to pass the  Reddick catheter.  Two clips were then placed proximally on the cystic duct  and the duct was divided between the two sets of clips.  Posterior to this  the cystic artery was also identified and again dissected circumferentially  in a blunt manner with very careful dissection.  Two clips were placed  proximally and one distally in the artery, and the artery was divided  between the two.  Next the gallbladder was separated from the liver bed  using the hook electrocautery.  Prior to completely detaching the  gallbladder from the liver bed, a small accessory duct of Luschka was  identified and also clipped.  The liver bed was then inspected and several  small bleeding points were coagulated with the electrocautery until the  liver bed was completely hemostatic.  The gallbladder was then detached the  rest of the way from the liver bed with the electrocautery without  difficulty.  The laparoscope was then moved to the epigastric port and a  gallbladder grasper was placed through the Hasson cannula and used to grasp  the neck of the gallbladder.  The gallbladder was then removed with the  Hasson cannula through the supraumbilical port without difficulty.  The  abdomen was then irrigated with copious amounts of saline until the effluent  was clear.  The liver bed was inspected again and found to be hemostatic.  The ports were then all removed under direct vision and found to be  hemostatic.  The gas was allowed to escape.  The fascial defect at the supraumbilical port was closed with  the previously-placed Vicryl pursestring  stitch and reinforced with an extra interrupted 0 Vicryl stitch.  The skin  incisions were all closed with interrupted 4-0 Monocryl subcuticular  stitches.  At this point the patient's care was turned over to [facility].  Fontaine, M.D., an OB/GYN, to do a transabdominal hysterectomy.  The patient  was in stable condition at this point, and all needle, sponge, and  instrument counts were correct.                                               Ollen Gross. Vernell Morgans, M.D.    PST/MEDQ  D:  12/11/2002  T:  12/12/2002  Job:  [id number]

## 2010-09-06 NOTE — Discharge Summary (Signed)
[name], BORROR             ACCOUNT NO.:  [id number]   MEDICAL RECORD NO.:  [id number]          PATIENT TYPE:  INP   LOCATION:  3014                         FACILITY:  [facility]   PHYSICIAN:  Clydene Fake, M.D.  DATE OF BIRTH:  Mar 01, 1951   DATE OF ADMISSION:  01/16/2004  DATE OF DISCHARGE:  01/21/2004                                 DISCHARGE SUMMARY   DIAGNOSIS:  Lumbar stenosis and unstable spondylolisthesis, L5-S1.   DISCHARGE DIAGNOSIS:  Lumbar stenosis and unstable spondylolisthesis, L5-S1.   PROCEDURE:  __________ L5-S1, decompressive laminotomy at L4-5, with PLIF at  L5-S1, interbody changes at L5-S1, ___________.   HOSPITAL COURSE:  The patient was admitted the day of surgery and underwent  the procedure above without consultations.  Postop, the patient was  transferred to the recovery room and to the floor.  Postop her legs were  grossly intact. Incision looked good.  The following day she complained of  incisional pain, some nausea and vomiting, some moderate wound drainage but  moving her legs well. Sensory grossly intact, motor grossly intact and  returning.  PT/OT were consulted to help with that.  She started getting up,  ambulating, and __________ positive flatus but no bowel movements. Less  nausea.  Incision drainage was slowing significantly.  It was felt she was  doing well and we could get rid of her PCA and switched to p.o. pills,  increasing activities.  She continued to slowly increase her activity and do  well.  She was stable with only minimal drainage on the dressing, tolerating  p.o. well and was discharged home in stable condition on 02/08/04.  Discharge medicine was prehospitalization plus Percocet and Flexeril.   ACTIVITY:  Up with brace.   FOLLOW UP:  Followup will be at my office in two weeks.      JRH/MEDQ  D:  02/08/2004  T:  02/08/2004  Job:  [id number]

## 2010-11-11 ENCOUNTER — Encounter: Payer: Self-pay | Admitting: *Deleted

## 2010-11-11 ENCOUNTER — Encounter: Payer: Self-pay | Admitting: Cardiology

## 2010-11-12 ENCOUNTER — Ambulatory Visit: Payer: Self-pay | Admitting: Cardiology

## 2010-11-12 ENCOUNTER — Encounter: Payer: Self-pay | Admitting: Cardiology

## 2010-11-12 ENCOUNTER — Ambulatory Visit (INDEPENDENT_AMBULATORY_CARE_PROVIDER_SITE_OTHER): Payer: BC Managed Care – PPO | Admitting: Cardiology

## 2010-11-12 DIAGNOSIS — R079 Chest pain, unspecified: Secondary | ICD-10-CM | POA: Insufficient documentation

## 2010-11-12 DIAGNOSIS — R002 Palpitations: Secondary | ICD-10-CM

## 2010-11-12 DIAGNOSIS — R011 Cardiac murmur, unspecified: Secondary | ICD-10-CM

## 2010-11-12 NOTE — Assessment & Plan Note (Signed)
These appeared to be self-limiting and not particularly bothersome. Can consider Cardionet in the future if symptoms worsen.

## 2010-11-12 NOTE — Assessment & Plan Note (Signed)
Symptoms atypical. I do not think they're cardiac. Electrocardiogram shows no ST changes. No further evaluation at this time.

## 2010-11-12 NOTE — Assessment & Plan Note (Signed)
Schedule echocardiogram

## 2010-11-12 NOTE — Progress Notes (Signed)
HPI: 60 year old female for evaluation of palpitations and murmur. Patient states that she has had a murmur since she was a child. However she feels that it has increased in intensity recently. She did not have dyspnea on exertion, orthopnea, PND or syncope. She has chronic pedal edema. She occasionally feels a brief flutter but no sustained palpitations. She occasionally feels chest pain described as a pinprick sensation. It lasts one second and resolve spontaneously. She does not have exertional chest pain. Because of the above were asked to further evaluate.  Current Outpatient Prescriptions  Medication Sig Dispense Refill  . fexofenadine (ALLEGRA) 180 MG tablet Take 180 mg by mouth daily. As needed       . Hydrocodone-Acetaminophen (VICODIN PO) Take 1 tablet by mouth as needed.        Marland Kitchen ibuprofen (ADVIL,MOTRIN) 400 MG tablet Take 400 mg by mouth every 6 (six) hours as needed.          Allergies  Allergen Reactions  . Ciprofloxacin     Past Medical History  Diagnosis Date  . Murmur   . Anemia   . OA (osteoarthritis)   . Obese   . Plantar fasciitis   . Barrett's esophagus     Past Surgical History  Procedure Date  . Cesarean section   . Right knee replacement   . Back surgery   . Arthroscopic surgery left knee   . Abdominal hysterectomy   . Cholecystectomy   . Tonsillectomy     History   Social History  . Marital Status: Married    Spouse Name: N/A    Number of Children: 3  . Years of Education: N/A   Occupational History  .      Telephone sales   Social History Main Topics  . Smoking status: Never Smoker   . Smokeless tobacco: Not on file  . Alcohol Use: No  . Drug Use: Not on file  . Sexually Active: Not on file   Other Topics Concern  . Not on file   Social History Narrative  . No narrative on file    Family History  Problem Relation Age of Onset  . Stroke Maternal Grandmother     ROS: low back pain but no fevers or chills, productive cough,  hemoptysis, dysphasia, odynophagia, melena, hematochezia, dysuria, hematuria, rash, seizure activity, orthopnea, PND,  claudication. Remaining systems are negative.  Physical Exam: General:  Well developed/obese in NAD Skin warm/dry Patient not depressed No peripheral clubbing Back-normal HEENT-normal/normal eyelids Neck supple/normal carotid upstroke bilaterally; no bruits; no JVD; no thyromegaly chest - CTA/ normal expansion CV - RRR/normal S1 and S2; no rubs or gallops;  PMI nondisplaced; question 1/6 systolic murmur left sternal border. No change with Valsalva. Abdomen -NT/ND, no HSM, no mass, + bowel sounds, no bruit 2+ femoral pulses, no bruits Ext-x-rays edema, chords, 2+ DP; varicosities noted Neuro-grossly nonfocal  ECG normal sinus rhythm at a rate of 76. No ST changes.

## 2010-11-12 NOTE — Patient Instructions (Signed)
Your physician has requested that you have an echocardiogram. Echocardiography is a painless test that uses sound waves to create images of your heart. It provides your doctor with information about the size and shape of your heart and how well your heart's chambers and valves are working. This procedure takes approximately one hour. There are no restrictions for this procedure.   

## 2010-11-20 ENCOUNTER — Encounter: Payer: Self-pay | Admitting: *Deleted

## 2010-11-21 ENCOUNTER — Other Ambulatory Visit (HOSPITAL_COMMUNITY): Payer: BC Managed Care – PPO | Admitting: Radiology

## 2010-11-26 ENCOUNTER — Ambulatory Visit (HOSPITAL_COMMUNITY): Payer: BC Managed Care – PPO | Attending: Cardiology | Admitting: Radiology

## 2010-11-26 ENCOUNTER — Encounter: Payer: Self-pay | Admitting: Cardiology

## 2010-11-26 DIAGNOSIS — R011 Cardiac murmur, unspecified: Secondary | ICD-10-CM | POA: Insufficient documentation

## 2010-11-26 DIAGNOSIS — R079 Chest pain, unspecified: Secondary | ICD-10-CM | POA: Insufficient documentation

## 2010-11-26 DIAGNOSIS — R609 Edema, unspecified: Secondary | ICD-10-CM | POA: Insufficient documentation

## 2010-11-26 DIAGNOSIS — R002 Palpitations: Secondary | ICD-10-CM | POA: Insufficient documentation

## 2011-01-30 LAB — CBC
HCT: 26.9 — ABNORMAL LOW
HCT: 31.7 — ABNORMAL LOW
HCT: 37.9
Hemoglobin: 11 — ABNORMAL LOW
Hemoglobin: 9.2 — ABNORMAL LOW
Hemoglobin: 13
MCHC: 34.3
MCHC: 34.6
MCHC: 34.2
MCV: 83
MCV: 83.8
MCV: 82.8
Platelets: 164
Platelets: 235
Platelets: 241
RBC: 3.22 — ABNORMAL LOW
RBC: 3.82 — ABNORMAL LOW
RBC: 4.58
RDW: 13.8
RDW: 14
RDW: 14.2 — ABNORMAL HIGH
WBC: 6.7
WBC: 8.4
WBC: 5

## 2011-01-30 LAB — URINALYSIS, ROUTINE W REFLEX MICROSCOPIC
Bilirubin Urine: NEGATIVE
Glucose, UA: NEGATIVE
Hgb urine dipstick: NEGATIVE
Ketones, ur: NEGATIVE
Nitrite: NEGATIVE
Protein, ur: NEGATIVE
Specific Gravity, Urine: 1.014
Urobilinogen, UA: 1
pH: 6.5

## 2011-01-30 LAB — PROTIME-INR
INR: 1.1
INR: 1.5
INR: 1.6 — ABNORMAL HIGH
INR: 1.7 — ABNORMAL HIGH
INR: 2.2 — ABNORMAL HIGH
INR: 0.9
Prothrombin Time: 14.5
Prothrombin Time: 18.8 — ABNORMAL HIGH
Prothrombin Time: 19.7 — ABNORMAL HIGH
Prothrombin Time: 20.6 — ABNORMAL HIGH
Prothrombin Time: 25.2 — ABNORMAL HIGH
Prothrombin Time: 12.8

## 2011-01-30 LAB — APTT: aPTT: 28

## 2011-01-30 LAB — BASIC METABOLIC PANEL
BUN: 12
BUN: 3 — ABNORMAL LOW
CO2: 28
CO2: 32
Calcium: 8.2 — ABNORMAL LOW
Calcium: 8.2 — ABNORMAL LOW
Chloride: 102
Chloride: 106
Creatinine, Ser: 0.63
Creatinine, Ser: 0.83
GFR calc Af Amer: >60
GFR calc Af Amer: >60
GFR calc non Af Amer: >60
GFR calc non Af Amer: >60
Glucose, Bld: 133 — ABNORMAL HIGH
Glucose, Bld: 146 — ABNORMAL HIGH
Potassium: 3.8
Potassium: 4.3
Sodium: 135
Sodium: 140

## 2011-01-30 LAB — COMPREHENSIVE METABOLIC PANEL
ALT: 28
AST: 26
Albumin: 3.3 — ABNORMAL LOW
Alkaline Phosphatase: 68
BUN: 9
CO2: 29
Calcium: 9.5
Chloride: 105
Creatinine, Ser: 0.71
GFR calc Af Amer: >60
GFR calc non Af Amer: >60
Glucose, Bld: 112 — ABNORMAL HIGH
Potassium: 4.1
Sodium: 143
Total Bilirubin: 1.3 — ABNORMAL HIGH
Total Protein: 6.5

## 2013-01-18 ENCOUNTER — Other Ambulatory Visit: Payer: Self-pay | Admitting: Gastroenterology

## 2015-01-02 ENCOUNTER — Ambulatory Visit (INDEPENDENT_AMBULATORY_CARE_PROVIDER_SITE_OTHER): Payer: PRIVATE HEALTH INSURANCE

## 2015-01-02 ENCOUNTER — Ambulatory Visit (INDEPENDENT_AMBULATORY_CARE_PROVIDER_SITE_OTHER): Payer: PRIVATE HEALTH INSURANCE | Admitting: Podiatry

## 2015-01-02 ENCOUNTER — Encounter: Payer: Self-pay | Admitting: Podiatry

## 2015-01-02 VITALS — BP 136/83 | HR 73 | Resp 18

## 2015-01-02 DIAGNOSIS — M722 Plantar fascial fibromatosis: Secondary | ICD-10-CM

## 2015-01-02 DIAGNOSIS — M19072 Primary osteoarthritis, left ankle and foot: Secondary | ICD-10-CM

## 2015-01-02 DIAGNOSIS — R52 Pain, unspecified: Secondary | ICD-10-CM | POA: Diagnosis not present

## 2015-01-02 DIAGNOSIS — M84374A Stress fracture, right foot, initial encounter for fracture: Secondary | ICD-10-CM

## 2015-01-02 NOTE — Patient Instructions (Signed)
Plantar Fasciitis (Heel Spur Syndrome) with Rehab The plantar fascia is a fibrous, ligament-like, soft-tissue structure that spans the bottom of the foot. Plantar fasciitis is a condition that causes pain in the foot due to inflammation of the tissue. SYMPTOMS   Pain and tenderness on the underneath side of the foot.  Pain that worsens with standing or walking. CAUSES  Plantar fasciitis is caused by irritation and injury to the plantar fascia on the underneath side of the foot. Common mechanisms of injury include:  Direct trauma to bottom of the foot.  Damage to a small nerve that runs under the foot where the main fascia attaches to the heel bone.  Stress placed on the plantar fascia due to bone spurs. RISK INCREASES WITH:   Activities that place stress on the plantar fascia (running, jumping, pivoting, or cutting).  Poor strength and flexibility.  Improperly fitted shoes.  Tight calf muscles.  Flat feet.  Failure to warm-up properly before activity.  Obesity. PREVENTION  Warm up and stretch properly before activity.  Allow for adequate recovery between workouts.  Maintain physical fitness:  Strength, flexibility, and endurance.  Cardiovascular fitness.  Maintain a health body weight.  Avoid stress on the plantar fascia.  Wear properly fitted shoes, including arch supports for individuals who have flat feet. PROGNOSIS  If treated properly, then the symptoms of plantar fasciitis usually resolve without surgery. However, occasionally surgery is necessary. RELATED COMPLICATIONS   Recurrent symptoms that may result in a chronic condition.  Problems of the lower back that are caused by compensating for the injury, such as limping.  Pain or weakness of the foot during push-off following surgery.  Chronic inflammation, scarring, and partial or complete fascia tear, occurring more often from repeated injections. TREATMENT  Treatment initially involves the use of  ice and medication to help reduce pain and inflammation. The use of strengthening and stretching exercises may help reduce pain with activity, especially stretches of the Achilles tendon. These exercises may be performed at home or with a therapist. Your caregiver may recommend that you use heel cups of arch supports to help reduce stress on the plantar fascia. Occasionally, corticosteroid injections are given to reduce inflammation. If symptoms persist for greater than 6 months despite non-surgical (conservative), then surgery may be recommended.  MEDICATION   If pain medication is necessary, then nonsteroidal anti-inflammatory medications, such as aspirin and ibuprofen, or other minor pain relievers, such as acetaminophen, are often recommended.  Do not take pain medication within 7 days before surgery.  Prescription pain relievers may be given if deemed necessary by your caregiver. Use only as directed and only as much as you need.  Corticosteroid injections may be given by your caregiver. These injections should be reserved for the most serious cases, because they may only be given a certain number of times. HEAT AND COLD  Cold treatment (icing) relieves pain and reduces inflammation. Cold treatment should be applied for 10 to 15 minutes every 2 to 3 hours for inflammation and pain and immediately after any activity that aggravates your symptoms. Use ice packs or massage the area with a piece of ice (ice massage).  Heat treatment may be used prior to performing the stretching and strengthening activities prescribed by your caregiver, physical therapist, or athletic trainer. Use a heat pack or soak the injury in warm water. SEEK IMMEDIATE MEDICAL CARE IF:  Treatment seems to offer no benefit, or the condition worsens.  Any medications produce adverse side effects. EXERCISES RANGE   OF MOTION (ROM) AND STRETCHING EXERCISES - Plantar Fasciitis (Heel Spur Syndrome) These exercises may help you  when beginning to rehabilitate your injury. Your symptoms may resolve with or without further involvement from your physician, physical therapist or athletic trainer. While completing these exercises, remember:   Restoring tissue flexibility helps normal motion to return to the joints. This allows healthier, less painful movement and activity.  An effective stretch should be held for at least 30 seconds.  A stretch should never be painful. You should only feel a gentle lengthening or release in the stretched tissue. RANGE OF MOTION - Toe Extension, Flexion  Sit with your right / left leg crossed over your opposite knee.  Grasp your toes and gently pull them back toward the top of your foot. You should feel a stretch on the bottom of your toes and/or foot.  Hold this stretch for __________ seconds.  Now, gently pull your toes toward the bottom of your foot. You should feel a stretch on the top of your toes and or foot.  Hold this stretch for __________ seconds. Repeat __________ times. Complete this stretch __________ times per day.  RANGE OF MOTION - Ankle Dorsiflexion, Active Assisted  Remove shoes and sit on a chair that is preferably not on a carpeted surface.  Place right / left foot under knee. Extend your opposite leg for support.  Keeping your heel down, slide your right / left foot back toward the chair until you feel a stretch at your ankle or calf. If you do not feel a stretch, slide your bottom forward to the edge of the chair, while still keeping your heel down.  Hold this stretch for __________ seconds. Repeat __________ times. Complete this stretch __________ times per day.  STRETCH - Gastroc, Standing  Place hands on wall.  Extend right / left leg, keeping the front knee somewhat bent.  Slightly point your toes inward on your back foot.  Keeping your right / left heel on the floor and your knee straight, shift your weight toward the wall, not allowing your back to  arch.  You should feel a gentle stretch in the right / left calf. Hold this position for __________ seconds. Repeat __________ times. Complete this stretch __________ times per day. STRETCH - Soleus, Standing  Place hands on wall.  Extend right / left leg, keeping the other knee somewhat bent.  Slightly point your toes inward on your back foot.  Keep your right / left heel on the floor, bend your back knee, and slightly shift your weight over the back leg so that you feel a gentle stretch deep in your back calf.  Hold this position for __________ seconds. Repeat __________ times. Complete this stretch __________ times per day. STRETCH - Gastrocsoleus, Standing  Note: This exercise can place a lot of stress on your foot and ankle. Please complete this exercise only if specifically instructed by your caregiver.   Place the ball of your right / left foot on a step, keeping your other foot firmly on the same step.  Hold on to the wall or a rail for balance.  Slowly lift your other foot, allowing your body weight to press your heel down over the edge of the step.  You should feel a stretch in your right / left calf.  Hold this position for __________ seconds.  Repeat this exercise with a slight bend in your right / left knee. Repeat __________ times. Complete this stretch __________ times per day.    STRENGTHENING EXERCISES - Plantar Fasciitis (Heel Spur Syndrome)  These exercises may help you when beginning to rehabilitate your injury. They may resolve your symptoms with or without further involvement from your physician, physical therapist or athletic trainer. While completing these exercises, remember:   Muscles can gain both the endurance and the strength needed for everyday activities through controlled exercises.  Complete these exercises as instructed by your physician, physical therapist or athletic trainer. Progress the resistance and repetitions only as guided. STRENGTH -  Towel Curls  Sit in a chair positioned on a non-carpeted surface.  Place your foot on a towel, keeping your heel on the floor.  Pull the towel toward your heel by only curling your toes. Keep your heel on the floor.  If instructed by your physician, physical therapist or athletic trainer, add ____________________ at the end of the towel. Repeat __________ times. Complete this exercise __________ times per day. STRENGTH - Ankle Inversion  Secure one end of a rubber exercise band/tubing to a fixed object (table, pole). Loop the other end around your foot just before your toes.  Place your fists between your knees. This will focus your strengthening at your ankle.  Slowly, pull your big toe up and in, making sure the band/tubing is positioned to resist the entire motion.  Hold this position for __________ seconds.  Have your muscles resist the band/tubing as it slowly pulls your foot back to the starting position. Repeat __________ times. Complete this exercises __________ times per day.  Document Released: 04/07/2005 Document Revised: 06/30/2011 Document Reviewed: 07/20/2008 [employer] Patient Information 2015 [employer]. This information is not intended to replace advice given to you by your health care provider. Make sure you discuss any questions you have with your health care provider.  

## 2015-01-02 NOTE — Progress Notes (Signed)
Subjective:    Patient ID: [name], female    DOB: Mar 16, 1951, 64 y.o.   MRN: [id number]  HPI  64 year old female presents the office today with complaints of right heel pain which has been ongoing for last several months. She states that she did see orthopedics back in January of this year in which inserts were made. She stopped wearing the inserts that she believes that they were not helping. She feel it and needle his been pulled out of the side of her foot. She appears to put a fascial release and tarsal tunnel release in 1999. Denies any recent injury,. Denies use swelling or redness. No tenderness. The pain does not wake her up at night.  She has secondary concerns of left foot pain which has been ongoing the last couple weeks. She is concerned she may have a stress fracture to the left foot. She states that she has painted the top of her foot particularly at the end of the day or when sitting down. She thinks that she may be favoring the left side given the right heel pain. She's had no recent treatment for this. She denies any swelling or redness. No tenderness. No other complaints.    Review of Systems  All other systems reviewed and are negative.      Objective:   Physical Exam AAO x3, NAD DP/PT pulses palpable bilaterally, CRT less than 3 seconds Protective sensation intact with Simms Weinstein monofilament, vibratory sensation intact, Achilles tendon reflex intact; negative Tinel sign Tenderness to palpation overlying the plantar medial tubercle of the calcaneus to the right heel at the insertion of the plantar fascia. There is no pain along the course of plantar fascial within the arch of the foot. There is no pain with lateral compression of the calcaneus or pain the vibratory sensation. No pain on the posterior aspect of the calcaneus or along the course/insertion of the Achilles tendon. There is a scar overlying the medial aspect of the right ankle as well as over one  area tarsal tunnel. There is moderate pain to palpation overlying the dorsal aspect of the left forefoot overlying most of the third metatarsal base. There is diffuse tenderness along the Lisfranc joint. There is no other area pinpoint bony tenderness. No other pain to the left foot. There is no overlying edema, erythema, increase in warmth. No other areas of tenderness palpation or pain with vibratory sensation to the foot/ankle. MMT 5/5, ROM WNL No open lesions or pre-ulcerative lesions are identified. No pain with calf compression, swelling, warmth, erythema.     Assessment & Plan:  64 year old female right heel pain, possible plantar fascial reoccurrence; left dorsal foot pain likely arthritis however cannot rule out stress fracture/fracture third metatarsal base. -X-rays were obtained and reviewed with the patient. There is a heel spur present on the right side. On the left side there is a question will radiolucency within the third metatarsal base which may be subacute fracture. -Treatment options discussed including all alternatives, risks, and complications -Given the findings and the amount of pain to the left foot will immobilize and a surgical shoe for now. Ice and elevation. Anti-inflammatories as needed. -On the right foot discussed possible etiology for symptoms. Patient elects to proceed with steroid injection into the right heel. Under sterile skin preparation, a total of 2.5cc of kenalog 10, 0.5% Marcaine plain, and 2% lidocaine plain were infiltrated into the symptomatic area without complication. A band-aid was applied. Patient tolerated the injection well  without complication. Post-injection care with discussed with the patient. Discussed with the patient to ice the area over the next couple of days to help prevent a steroid flare. Plantar fascial brace was dispensed. Ice and stretching exercises daily. Continue with supportive shoe gear. Discussed with orthotics. -Follow-up in 3  weeks or sooner if any problems arise. In the meantime, encouraged to call the office with any questions, concerns, change in symptoms.   Ovid Curd, DPM

## 2015-01-05 ENCOUNTER — Ambulatory Visit: Payer: PRIVATE HEALTH INSURANCE | Admitting: Podiatry

## 2015-01-23 ENCOUNTER — Ambulatory Visit: Payer: PRIVATE HEALTH INSURANCE | Admitting: Podiatry

## 2015-07-23 ENCOUNTER — Other Ambulatory Visit: Payer: Self-pay | Admitting: Rehabilitation

## 2015-07-23 DIAGNOSIS — M5416 Radiculopathy, lumbar region: Secondary | ICD-10-CM

## 2015-07-31 ENCOUNTER — Other Ambulatory Visit: Payer: Self-pay

## 2015-08-28 ENCOUNTER — Ambulatory Visit
Admission: RE | Admit: 2015-08-28 | Discharge: 2015-08-28 | Disposition: A | Discharge status: Home / Self Care | Payer: Self-pay | Source: Ambulatory Visit | Attending: Rehabilitation | Admitting: Rehabilitation

## 2015-08-28 DIAGNOSIS — M5416 Radiculopathy, lumbar region: Secondary | ICD-10-CM

## 2015-08-28 MED ORDER — GADOBENATE DIMEGLUMINE 529 MG/ML IV SOLN
20.0000 mL | Freq: Once | INTRAVENOUS | Status: AC | PRN
  Administered 2015-08-28: 20 mL via INTRAVENOUS

## 2015-09-24 ENCOUNTER — Other Ambulatory Visit: Payer: Self-pay | Admitting: Family Medicine

## 2015-09-25 LAB — CMP12+LP+TP+TSH+6AC+CBC/D/PLT
ALT: 8 IU/L (ref 0–32)
AST: 16 IU/L (ref 0–40)
Albumin/Globulin Ratio: 1.3 (ref 1.2–2.2)
Albumin: 3.7 g/dL (ref 3.6–4.8)
Alkaline Phosphatase: 65 IU/L (ref 39–117)
BUN/Creatinine Ratio: 19 (ref 12–28)
BUN: 15 mg/dL (ref 8–27)
Basophils Absolute: 0 10*3/uL (ref 0.0–0.2)
Basos: 0 %
Bilirubin Total: 0.8 mg/dL (ref 0.0–1.2)
Calcium: 9 mg/dL (ref 8.7–10.3)
Chloride: 99 mmol/L (ref 96–106)
Chol/HDL Ratio: 4.1 ratio units (ref 0.0–4.4)
Cholesterol, Total: 176 mg/dL (ref 100–199)
Creatinine, Ser: 0.79 mg/dL (ref 0.57–1.00)
EOS (ABSOLUTE): 0.2 10*3/uL (ref 0.0–0.4)
Eos: 3 %
Estimated CHD Risk: 0.9 times avg. (ref 0.0–1.0)
Free Thyroxine Index: 1.8 (ref 1.2–4.9)
GFR calc Af Amer: 91 mL/min/{1.73_m2} (ref >59)
GFR calc non Af Amer: 79 mL/min/{1.73_m2} (ref >59)
GGT: 58 IU/L (ref 0–60)
Globulin, Total: 2.9 g/dL (ref 1.5–4.5)
Glucose: 116 mg/dL — ABNORMAL HIGH (ref 65–99)
HDL: 43 mg/dL (ref >39)
Hematocrit: 39.4 % (ref 34.0–46.6)
Hemoglobin: 12.7 g/dL (ref 11.1–15.9)
Immature Grans (Abs): 0 10*3/uL (ref 0.0–0.1)
Immature Granulocytes: 0 %
Iron: 66 ug/dL (ref 27–139)
LDH: 186 IU/L (ref 119–226)
LDL Calculated: 109 mg/dL — ABNORMAL HIGH (ref 0–99)
Lymphocytes Absolute: 1.6 10*3/uL (ref 0.7–3.1)
Lymphs: 30 %
MCH: 27.3 pg (ref 26.6–33.0)
MCHC: 32.2 g/dL (ref 31.5–35.7)
MCV: 85 fL (ref 79–97)
Monocytes Absolute: 0.3 10*3/uL (ref 0.1–0.9)
Monocytes: 6 %
Neutrophils Absolute: 3.2 10*3/uL (ref 1.4–7.0)
Neutrophils: 61 %
Phosphorus: 3.6 mg/dL (ref 2.5–4.5)
Platelets: 199 10*3/uL (ref 150–379)
Potassium: 4.3 mmol/L (ref 3.5–5.2)
RBC: 4.65 x10E6/uL (ref 3.77–5.28)
RDW: 13.8 % (ref 12.3–15.4)
Sodium: 139 mmol/L (ref 134–144)
T3 Uptake Ratio: 21 % — ABNORMAL LOW (ref 24–39)
T4, Total: 8.6 ug/dL (ref 4.5–12.0)
TSH: 3.38 u[IU]/mL (ref 0.450–4.500)
Total Protein: 6.6 g/dL (ref 6.0–8.5)
Triglycerides: 121 mg/dL (ref 0–149)
Uric Acid: 5.9 mg/dL (ref 2.5–7.1)
VLDL Cholesterol Cal: 24 mg/dL (ref 5–40)
WBC: 5.3 10*3/uL (ref 3.4–10.8)

## 2015-09-25 LAB — HGB A1C W/O EAG: Hgb A1c MFr Bld: 6.9 % — ABNORMAL HIGH (ref 4.8–5.6)

## 2015-09-25 LAB — VITAMIN D 25 HYDROXY (VIT D DEFICIENCY, FRACTURES): Vit D, 25-Hydroxy: 20.4 ng/mL — ABNORMAL LOW (ref 30.0–100.0)

## 2015-10-31 HISTORY — PX: SPINAL FUSION: SHX223

## 2015-12-19 ENCOUNTER — Inpatient Hospital Stay (HOSPITAL_COMMUNITY)
Admission: EM | Admit: 2015-12-19 | Discharge: 2015-12-22 | DRG: 175 | Disposition: A | Discharge status: Home / Self Care | Payer: No Typology Code available for payment source | Attending: Internal Medicine | Admitting: Internal Medicine

## 2015-12-19 ENCOUNTER — Emergency Department (HOSPITAL_COMMUNITY): Payer: No Typology Code available for payment source

## 2015-12-19 ENCOUNTER — Encounter (HOSPITAL_COMMUNITY): Payer: Self-pay

## 2015-12-19 ENCOUNTER — Inpatient Hospital Stay (HOSPITAL_COMMUNITY): Payer: No Typology Code available for payment source

## 2015-12-19 DIAGNOSIS — R739 Hyperglycemia, unspecified: Secondary | ICD-10-CM | POA: Diagnosis present

## 2015-12-19 DIAGNOSIS — I2699 Other pulmonary embolism without acute cor pulmonale: Secondary | ICD-10-CM | POA: Diagnosis present

## 2015-12-19 DIAGNOSIS — Z823 Family history of stroke: Secondary | ICD-10-CM

## 2015-12-19 DIAGNOSIS — Z981 Arthrodesis status: Secondary | ICD-10-CM | POA: Diagnosis not present

## 2015-12-19 DIAGNOSIS — R778 Other specified abnormalities of plasma proteins: Secondary | ICD-10-CM | POA: Diagnosis present

## 2015-12-19 DIAGNOSIS — Z90710 Acquired absence of both cervix and uterus: Secondary | ICD-10-CM

## 2015-12-19 DIAGNOSIS — R071 Chest pain on breathing: Secondary | ICD-10-CM | POA: Diagnosis not present

## 2015-12-19 DIAGNOSIS — Z881 Allergy status to other antibiotic agents status: Secondary | ICD-10-CM | POA: Diagnosis not present

## 2015-12-19 DIAGNOSIS — J9601 Acute respiratory failure with hypoxia: Secondary | ICD-10-CM | POA: Diagnosis present

## 2015-12-19 DIAGNOSIS — R0902 Hypoxemia: Secondary | ICD-10-CM

## 2015-12-19 DIAGNOSIS — M1990 Unspecified osteoarthritis, unspecified site: Secondary | ICD-10-CM | POA: Diagnosis present

## 2015-12-19 DIAGNOSIS — R7989 Other specified abnormal findings of blood chemistry: Secondary | ICD-10-CM | POA: Diagnosis not present

## 2015-12-19 DIAGNOSIS — Z96651 Presence of right artificial knee joint: Secondary | ICD-10-CM | POA: Diagnosis present

## 2015-12-19 DIAGNOSIS — K2270 Barrett's esophagus without dysplasia: Secondary | ICD-10-CM | POA: Diagnosis present

## 2015-12-19 DIAGNOSIS — I2692 Saddle embolus of pulmonary artery without acute cor pulmonale: Secondary | ICD-10-CM | POA: Insufficient documentation

## 2015-12-19 DIAGNOSIS — R000 Tachycardia, unspecified: Secondary | ICD-10-CM | POA: Diagnosis present

## 2015-12-19 DIAGNOSIS — I2602 Saddle embolus of pulmonary artery with acute cor pulmonale: Principal | ICD-10-CM | POA: Diagnosis present

## 2015-12-19 DIAGNOSIS — Z91018 Allergy to other foods: Secondary | ICD-10-CM

## 2015-12-19 DIAGNOSIS — I2782 Chronic pulmonary embolism: Secondary | ICD-10-CM

## 2015-12-19 DIAGNOSIS — Z6841 Body Mass Index (BMI) 40.0 and over, adult: Secondary | ICD-10-CM | POA: Diagnosis not present

## 2015-12-19 DIAGNOSIS — R079 Chest pain, unspecified: Secondary | ICD-10-CM | POA: Diagnosis present

## 2015-12-19 DIAGNOSIS — J189 Pneumonia, unspecified organism: Secondary | ICD-10-CM | POA: Diagnosis present

## 2015-12-19 DIAGNOSIS — R0602 Shortness of breath: Secondary | ICD-10-CM | POA: Diagnosis present

## 2015-12-19 DIAGNOSIS — Z9889 Other specified postprocedural states: Secondary | ICD-10-CM

## 2015-12-19 HISTORY — DX: Gastro-esophageal reflux disease without esophagitis: K21.9

## 2015-12-19 HISTORY — PX: IR GENERIC HISTORICAL: IMG1180011

## 2015-12-19 HISTORY — DX: Other pulmonary embolism without acute cor pulmonale: I26.99

## 2015-12-19 LAB — TROPONIN I
Troponin I: 1.5 ng/mL (ref <0.03)
Troponin I: 1.53 ng/mL (ref <0.03)

## 2015-12-19 LAB — COMPREHENSIVE METABOLIC PANEL
ALT: 16 U/L (ref 14–54)
AST: 25 U/L (ref 15–41)
Albumin: 3.5 g/dL (ref 3.5–5.0)
Alkaline Phosphatase: 83 U/L (ref 38–126)
Anion gap: 11 (ref 5–15)
BUN: 19 mg/dL (ref 6–20)
CO2: 23 mmol/L (ref 22–32)
Calcium: 9.4 mg/dL (ref 8.9–10.3)
Chloride: 103 mmol/L (ref 101–111)
Creatinine, Ser: 1.01 mg/dL — ABNORMAL HIGH (ref 0.44–1.00)
GFR calc Af Amer: >60 mL/min (ref >60)
GFR calc non Af Amer: 57 mL/min — ABNORMAL LOW (ref >60)
Glucose, Bld: 215 mg/dL — ABNORMAL HIGH (ref 65–99)
Potassium: 3.8 mmol/L (ref 3.5–5.1)
Sodium: 137 mmol/L (ref 135–145)
Total Bilirubin: 1.3 mg/dL — ABNORMAL HIGH (ref 0.3–1.2)
Total Protein: 7.3 g/dL (ref 6.5–8.1)

## 2015-12-19 LAB — CBC
HCT: 44.2 % (ref 36.0–46.0)
HCT: 35.8 % — ABNORMAL LOW (ref 36.0–46.0)
HCT: 36.4 % (ref 36.0–46.0)
Hemoglobin: 14 g/dL (ref 12.0–15.0)
Hemoglobin: 11.5 g/dL — ABNORMAL LOW (ref 12.0–15.0)
Hemoglobin: 11.7 g/dL — ABNORMAL LOW (ref 12.0–15.0)
MCH: 27 pg (ref 26.0–34.0)
MCH: 26.7 pg (ref 26.0–34.0)
MCH: 26.8 pg (ref 26.0–34.0)
MCHC: 31.7 g/dL (ref 30.0–36.0)
MCHC: 32.1 g/dL (ref 30.0–36.0)
MCHC: 32.1 g/dL (ref 30.0–36.0)
MCV: 85.2 fL (ref 78.0–100.0)
MCV: 83.1 fL (ref 78.0–100.0)
MCV: 83.5 fL (ref 78.0–100.0)
Platelets: 202 10*3/uL (ref 150–400)
Platelets: 158 10*3/uL (ref 150–400)
Platelets: 135 10*3/uL — ABNORMAL LOW (ref 150–400)
RBC: 5.19 MIL/uL — ABNORMAL HIGH (ref 3.87–5.11)
RBC: 4.31 MIL/uL (ref 3.87–5.11)
RBC: 4.36 MIL/uL (ref 3.87–5.11)
RDW: 13.5 % (ref 11.5–15.5)
RDW: 13.5 % (ref 11.5–15.5)
RDW: 13.6 % (ref 11.5–15.5)
WBC: 13.4 10*3/uL — ABNORMAL HIGH (ref 4.0–10.5)
WBC: 11.3 10*3/uL — ABNORMAL HIGH (ref 4.0–10.5)
WBC: 12.6 10*3/uL — ABNORMAL HIGH (ref 4.0–10.5)

## 2015-12-19 LAB — GLUCOSE, CAPILLARY
Glucose-Capillary: 129 mg/dL — ABNORMAL HIGH (ref 65–99)
Glucose-Capillary: 131 mg/dL — ABNORMAL HIGH (ref 65–99)
Glucose-Capillary: 164 mg/dL — ABNORMAL HIGH (ref 65–99)
Glucose-Capillary: 221 mg/dL — ABNORMAL HIGH (ref 65–99)
Glucose-Capillary: 230 mg/dL — ABNORMAL HIGH (ref 65–99)

## 2015-12-19 LAB — I-STAT TROPONIN, ED: Troponin i, poc: 0.26 ng/mL (ref 0.00–0.08)

## 2015-12-19 LAB — PROTIME-INR
INR: 1.04
Prothrombin Time: 13.6 seconds (ref 11.4–15.2)

## 2015-12-19 LAB — LACTIC ACID, PLASMA: Lactic Acid, Venous: 2 mmol/L (ref 0.5–1.9)

## 2015-12-19 LAB — I-STAT CHEM 8, ED
BUN: 23 mg/dL — ABNORMAL HIGH (ref 6–20)
Calcium, Ion: 1.13 mmol/L — ABNORMAL LOW (ref 1.15–1.40)
Chloride: 104 mmol/L (ref 101–111)
Creatinine, Ser: 1 mg/dL (ref 0.44–1.00)
Glucose, Bld: 211 mg/dL — ABNORMAL HIGH (ref 65–99)
HCT: 44 % (ref 36.0–46.0)
Hemoglobin: 15 g/dL (ref 12.0–15.0)
Potassium: 3.9 mmol/L (ref 3.5–5.1)
Sodium: 140 mmol/L (ref 135–145)
TCO2: 21 mmol/L (ref 0–100)

## 2015-12-19 LAB — HEPARIN LEVEL (UNFRACTIONATED)
Heparin Unfractionated: >2.2 IU/mL — ABNORMAL HIGH (ref 0.30–0.70)
Heparin Unfractionated: >2.2 IU/mL — ABNORMAL HIGH (ref 0.30–0.70)
Heparin Unfractionated: >2.2 IU/mL — ABNORMAL HIGH (ref 0.30–0.70)

## 2015-12-19 LAB — FIBRINOGEN
Fibrinogen: 338 mg/dL (ref 210–475)
Fibrinogen: 367 mg/dL (ref 210–475)
Fibrinogen: 299 mg/dL (ref 210–475)

## 2015-12-19 LAB — ANTITHROMBIN III: AntiThromb III Func: 112 % (ref 75–120)

## 2015-12-19 LAB — MRSA PCR SCREENING: MRSA by PCR: NEGATIVE

## 2015-12-19 LAB — D-DIMER, QUANTITATIVE: D-Dimer, Quant: >20 ug/mL-FEU — ABNORMAL HIGH (ref 0.00–0.50)

## 2015-12-19 LAB — BRAIN NATRIURETIC PEPTIDE: B Natriuretic Peptide: 20.7 pg/mL (ref 0.0–100.0)

## 2015-12-19 MED ORDER — LIDOCAINE HCL 1 % IJ SOLN
INTRAMUSCULAR | Status: AC
  Filled 2015-12-19: qty 20

## 2015-12-19 MED ORDER — HYDROCODONE-ACETAMINOPHEN 5-325 MG PO TABS
1.0000 | ORAL_TABLET | Freq: Four times a day (QID) | ORAL | Status: DC | PRN
  Administered 2015-12-19 – 2015-12-22 (×4): 1 via ORAL
  Filled 2015-12-19 (×4): qty 1

## 2015-12-19 MED ORDER — SODIUM CHLORIDE 0.9 % IV SOLN: 250.0000 mL | INTRAVENOUS | Status: DC | PRN

## 2015-12-19 MED ORDER — METHOCARBAMOL 500 MG PO TABS
500.0000 mg | ORAL_TABLET | Freq: Four times a day (QID) | ORAL | Status: DC | PRN
  Filled 2015-12-19: qty 1

## 2015-12-19 MED ORDER — HEPARIN (PORCINE) IN NACL 100-0.45 UNIT/ML-% IJ SOLN
1250.0000 [IU]/h | INTRAMUSCULAR | Status: DC
  Administered 2015-12-19: 1250 [IU]/h via INTRAVENOUS
  Filled 2015-12-19: qty 250

## 2015-12-19 MED ORDER — FENTANYL CITRATE (PF) 100 MCG/2ML IJ SOLN
INTRAMUSCULAR | Status: AC
  Filled 2015-12-19: qty 2

## 2015-12-19 MED ORDER — INSULIN ASPART 100 UNIT/ML ~~LOC~~ SOLN
0.0000 [IU] | SUBCUTANEOUS | Status: DC
  Administered 2015-12-19: 2 [IU] via SUBCUTANEOUS
  Administered 2015-12-19: 5 [IU] via SUBCUTANEOUS
  Administered 2015-12-19: 3 [IU] via SUBCUTANEOUS
  Administered 2015-12-20 (×4): 2 [IU] via SUBCUTANEOUS

## 2015-12-19 MED ORDER — MIDAZOLAM HCL 2 MG/2ML IJ SOLN
INTRAMUSCULAR | Status: AC
  Filled 2015-12-19: qty 2

## 2015-12-19 MED ORDER — SODIUM CHLORIDE 0.9 % IV SOLN
INTRAVENOUS | Status: DC
  Administered 2015-12-19: 21:00:00 via INTRAVENOUS

## 2015-12-19 MED ORDER — DEXTROSE 5 % IV SOLN
1.0000 g | INTRAVENOUS | Status: DC
  Administered 2015-12-19 – 2015-12-20 (×2): 1 g via INTRAVENOUS
  Filled 2015-12-19 (×4): qty 10

## 2015-12-19 MED ORDER — SODIUM CHLORIDE 0.9 % IV SOLN
12.0000 mg | Freq: Once | INTRAVENOUS | Status: DC
  Filled 2015-12-19: qty 12

## 2015-12-19 MED ORDER — SODIUM CHLORIDE 0.9% FLUSH
3.0000 mL | Freq: Two times a day (BID) | INTRAVENOUS | Status: DC
  Administered 2015-12-19 – 2015-12-21 (×4): 3 mL via INTRAVENOUS

## 2015-12-19 MED ORDER — SODIUM CHLORIDE 0.9 % IV SOLN: INTRAVENOUS | Status: DC

## 2015-12-19 MED ORDER — IOPAMIDOL (ISOVUE-370) INJECTION 76%
INTRAVENOUS | Status: AC
  Administered 2015-12-19: 100 mL
  Filled 2015-12-19: qty 100

## 2015-12-19 MED ORDER — SODIUM CHLORIDE 0.9% FLUSH: 3.0000 mL | INTRAVENOUS | Status: DC | PRN

## 2015-12-19 MED ORDER — HEPARIN BOLUS VIA INFUSION
5500.0000 [IU] | Freq: Once | INTRAVENOUS | Status: AC
  Administered 2015-12-19: 5500 [IU] via INTRAVENOUS
  Filled 2015-12-19: qty 5500

## 2015-12-19 MED ORDER — FAMOTIDINE IN NACL 20-0.9 MG/50ML-% IV SOLN
20.0000 mg | Freq: Two times a day (BID) | INTRAVENOUS | Status: DC
  Administered 2015-12-19 (×2): 20 mg via INTRAVENOUS
  Filled 2015-12-19 (×4): qty 50

## 2015-12-19 MED ORDER — HEPARIN (PORCINE) IN NACL 100-0.45 UNIT/ML-% IJ SOLN
1450.0000 [IU]/h | INTRAMUSCULAR | Status: DC
  Administered 2015-12-19: 1450 [IU]/h via INTRAVENOUS
  Filled 2015-12-19 (×2): qty 250

## 2015-12-19 MED ORDER — LIDOCAINE HCL 1 % IJ SOLN
INTRAMUSCULAR | Status: DC | PRN
  Administered 2015-12-19: 10 mL

## 2015-12-19 MED ORDER — IOPAMIDOL (ISOVUE-300) INJECTION 61%
INTRAVENOUS | Status: AC
  Administered 2015-12-19: 30 mL
  Filled 2015-12-19: qty 50

## 2015-12-19 NOTE — Sedation Documentation (Signed)
Pt complains of back pain but remains stable. No sedation medication given at this time .

## 2015-12-19 NOTE — Sedation Documentation (Signed)
Patient is resting comfortably. 

## 2015-12-19 NOTE — Progress Notes (Signed)
ANTICOAGULATION CONSULT NOTE - Initial Consult  Pharmacy Consult for Heparin Indication: pulmonary embolus  Allergies  Allergen Reactions  . Ciprofloxacin Anaphylaxis  . Strawberry Extract Anaphylaxis    Patient Measurements: Height: 5\' 2"  (157.5 cm) Weight: 260 lb (117.9 kg) IBW/kg (Calculated) : 50.1 Heparin Dosing Weight: 80 kg  Vital Signs: BP: 126/78 (08/30 0400) Pulse Rate: 117 (08/30 0400)  Labs:  Recent Labs  12/19/15 0142 12/19/15 0150  HGB 14.0 15.0  HCT 44.2 44.0  PLT 202  --   CREATININE 1.01* 1.00    Estimated Creatinine Clearance: 68.4 mL/min (by C-G formula based on SCr of 1 mg/dL).   Medical History: Past Medical History:  Diagnosis Date  . Anemia   . Barrett's esophagus   . Murmur   . OA (osteoarthritis)   . Obese   . Plantar fasciitis     Medications:  See electronic med rec  Assessment: 65 y.o. F presents with CP. CT shows large volume pulmonary embolus. There is a large central saddle at the bifurcation of the main pulmonary artery and multiple secondary saddles at subsequent bifurcations. There is CT evidence of right heart strain. To begin heparin. CBC ok on admission.  Goal of Therapy:  Heparin level 0.3-0.7 units/ml (aim for higher end with large volume PE) Monitor platelets by anticoagulation protocol: Yes   Plan:  Heparin IV bolus 5500 units Heparin gtt at 1450 units/hr Will f/u heparin level in 6 hours Daily heparin level and CBC  Sherlon Handing, PharmD, BCPS Clinical pharmacist, pager [phone] 12/19/2015,4:17 AM

## 2015-12-19 NOTE — Sedation Documentation (Signed)
No complaints from pt at this time.

## 2015-12-19 NOTE — ED Notes (Signed)
Admitting MD at the bedside.  

## 2015-12-19 NOTE — Sedation Documentation (Signed)
Pt states that she is in pain. No sedation medication given at this time.

## 2015-12-19 NOTE — Sedation Documentation (Signed)
L pulm artery pressure 36/26 (31)

## 2015-12-19 NOTE — Progress Notes (Signed)
ANTICOAGULATION CONSULT NOTE - [facility] for Heparin Indication:saddle pulmonary embolus  Allergies  Allergen Reactions  . Ciprofloxacin Anaphylaxis  . Strawberry Extract Anaphylaxis    Patient Measurements: Height: 5\' 5"  (165.1 cm) Weight: 259 lb 14.8 oz (117.9 kg) IBW/kg (Calculated) : 57 Heparin Dosing Weight: 80 kg  Vital Signs: Temp: 98.7 F (37.1 C) (08/30 1106) Temp Source: Oral (08/30 1106) BP: 144/94 (08/30 1230) Pulse Rate: 107 (08/30 1230)  Labs:  Recent Labs  12/19/15 0142 12/19/15 0150 12/19/15 0518 12/19/15 1115  HGB 14.0 15.0  --   --   HCT 44.2 44.0  --   --   PLT 202  --   --   --   LABPROT  --   --  13.6  --   INR  --   --  1.04  --   HEPARINUNFRC  --   --   --  >2.20*  CREATININE 1.01* 1.00  --   --   TROPONINI  --   --   --  1.50*    Estimated Creatinine Clearance: 72.1 mL/min (by C-G formula based on SCr of 1 mg/dL).   Medical History: Past Medical History:  Diagnosis Date  . Anemia   . Barrett's esophagus   . Murmur   . OA (osteoarthritis)   . Obese   . Plantar fasciitis     Assessment: 65 y.o. F with  a large volume massive pulmonary embolus. There is a large central saddle at the bifurcation of the main pulmonary artery and multiple secondary saddles at subsequent bifurcations. There is also CT evidence of right heart strain.   Patient started EKOS this morning (8/30) around 0800 with alteplase.   HL- >2.2 twice- Nurse took proper precautions since she had to draw out of the line where the heparin was infusing due to EKOS. The heparin was stopped for 5 minutes and 10 mLs of blood were wasted prior to the second high level.    Goal of Therapy:  EKOS Heparin Level (0.3-0.5) Monitor platelets by anticoagulation protocol: Yes   Plan:  Will hold heparin drip for 1 hour  Restart  heparin drip at 1250 units/hr  Will f/u heparin level in 6 hours Daily heparin level and CBC  Uvaldo Bristle, Pharm  D Pharmacy Resident Pager: [phone] 12/19/2015,1:03 PM

## 2015-12-19 NOTE — Progress Notes (Signed)
PULMONARY / CRITICAL CARE MEDICINE   Name: [name] MRN: [id number] DOB: 1950-07-14    ADMISSION DATE:  12/19/2015 CONSULTATION DATE:  12/19/2015  REFERRING MD:  EDP Dr. Betsey Holiday  CHIEF COMPLAINT:  SOB  HISTORY OF PRESENT ILLNESS:   65 year old female with PMH as below, which is significant for Barrett's esophagus, arthritis, anemia, and chronic back pain. She has a history of spinal surgery several years ago, which she just recently had revised 10/31/2015 at [facility]. She underwent revision of posterior spinal fusion on the lumbar facets and on the transverse processes at L4-5, Posterior spinal instrumentation at L4, L5, and S1 with 1 pedicle screw at each level bilaterally, Removal of instrumentation at L5 and S1, and Bilateral hemilaminectomy with decompression of the thecal sac, L2, L3, L4 and L5 nerve roots. She reports not having received "shots in her belly" during that admission like she has in the past. She was discharged to home 2 days later.  Post-discharge course was initially uncomplicated, but during the week of 8/27 she began to develop shortness of breath and experienced several episodes of near syncope. 8/30 early AM dyspnea became too strong and she presented to the emergency department with O2 sats in the 60s and profound work of breathing. She was started on BiPAP with 100% FiO2 and O2 sats  Improved into the 90s. CXR consistent with focal opacification concerning for pulmonary infarct. CT angio demonstrated large saddle PE with RV/LV ratio suggesting significant RV strain. She remained hemodynamically stable. She was started on heparin and PCCM asked to eval.    SUBJECTIVE:  NAD on NIMVS  VITAL SIGNS: BP (!) 133/102   Pulse (!) 102   Temp 97.6 F (36.4 C) (Oral)   Resp 18   Ht 5' 5" (1.651 m)   Wt 259 lb 14.8 oz (117.9 kg)   SpO2 96%   BMI 43.25 kg/m   HEMODYNAMICS:    VENTILATOR SETTINGS: Vent Mode: BIPAP;PSV FiO2 (%):  [40 %-100 %] 50  % Set Rate:  [0 bmp-8 bmp] 0 bmp PEEP:  [6 cmH20-8 cmH20] 8 cmH20 Pressure Support:  [8 cmH20] 8 cmH20  INTAKE / OUTPUT: I/O last 3 completed shifts: In: 33.6 [I.V.:33.6] Out: -   PHYSICAL EXAMINATION: General:  Morbidly obese female on BiPAP, NAD Neuro:  Alert, oriented, non-focal HEENT:  [place], PERRL, no JVd Cardiovascular:  Sinus regular. Chronic BLE edema Lungs:  Clear bilateral breath sounds, comfortable on BiPAP Abdomen:  Soft, non-tender, non-distended Musculoskeletal:  No acute deformity or ROM limitation Skin:  Grossly intact, cool feet  LABS:  BMET  Recent Labs Lab 12/19/15 0142 12/19/15 0150  NA 137 140  K 3.8 3.9  CL 103 104  CO2 23  --   BUN 19 23*  CREATININE 1.01* 1.00  GLUCOSE 215* 211*    Electrolytes  Recent Labs Lab 12/19/15 0142  CALCIUM 9.4    CBC  Recent Labs Lab 12/19/15 0142 12/19/15 0150  WBC 13.4*  --   HGB 14.0 15.0  HCT 44.2 44.0  PLT 202  --     Coag's  Recent Labs Lab 12/19/15 0518  INR 1.04    Sepsis Markers No results for input(s): LATICACIDVEN, PROCALCITON, O2SATVEN in the last 168 hours.  ABG No results for input(s): PHART, PCO2ART, PO2ART in the last 168 hours.  Liver Enzymes  Recent Labs Lab 12/19/15 0142  AST 25  ALT 16  ALKPHOS 83  BILITOT 1.3*  ALBUMIN 3.5  PULMONARY / CRITICAL CARE MEDICINE   Name: [name] MRN: [id number] DOB: 1950-07-14    ADMISSION DATE:  12/19/2015 CONSULTATION DATE:  12/19/2015  REFERRING MD:  EDP Dr. Betsey Holiday  CHIEF COMPLAINT:  SOB  HISTORY OF PRESENT ILLNESS:   65 year old female with PMH as below, which is significant for Barrett's esophagus, arthritis, anemia, and chronic back pain. She has a history of spinal surgery several years ago, which she just recently had revised 10/31/2015 at [facility]. She underwent revision of posterior spinal fusion on the lumbar facets and on the transverse processes at L4-5, Posterior spinal instrumentation at L4, L5, and S1 with 1 pedicle screw at each level bilaterally, Removal of instrumentation at L5 and S1, and Bilateral hemilaminectomy with decompression of the thecal sac, L2, L3, L4 and L5 nerve roots. She reports not having received "shots in her belly" during that admission like she has in the past. She was discharged to home 2 days later.  Post-discharge course was initially uncomplicated, but during the week of 8/27 she began to develop shortness of breath and experienced several episodes of near syncope. 8/30 early AM dyspnea became too strong and she presented to the emergency department with O2 sats in the 60s and profound work of breathing. She was started on BiPAP with 100% FiO2 and O2 sats  Improved into the 90s. CXR consistent with focal opacification concerning for pulmonary infarct. CT angio demonstrated large saddle PE with RV/LV ratio suggesting significant RV strain. She remained hemodynamically stable. She was started on heparin and PCCM asked to eval.    SUBJECTIVE:  NAD on NIMVS  VITAL SIGNS: BP (!) 133/102   Pulse (!) 102   Temp 97.6 F (36.4 C) (Oral)   Resp 18   Ht 5' 5" (1.651 m)   Wt 259 lb 14.8 oz (117.9 kg)   SpO2 96%   BMI 43.25 kg/m   HEMODYNAMICS:    VENTILATOR SETTINGS: Vent Mode: BIPAP;PSV FiO2 (%):  [40 %-100 %] 50  % Set Rate:  [0 bmp-8 bmp] 0 bmp PEEP:  [6 cmH20-8 cmH20] 8 cmH20 Pressure Support:  [8 cmH20] 8 cmH20  INTAKE / OUTPUT: I/O last 3 completed shifts: In: 33.6 [I.V.:33.6] Out: -   PHYSICAL EXAMINATION: General:  Morbidly obese female on BiPAP, NAD Neuro:  Alert, oriented, non-focal HEENT:  [place]/At, PERRL, no JVd Cardiovascular:  Sinus regular. Chronic BLE edema Lungs:  Clear bilateral breath sounds, comfortable on BiPAP Abdomen:  Soft, non-tender, non-distended Musculoskeletal:  No acute deformity or ROM limitation Skin:  Grossly intact, cool feet  LABS:  BMET  Recent Labs Lab 12/19/15 0142 12/19/15 0150  NA 137 140  K 3.8 3.9  CL 103 104  CO2 23  --   BUN 19 23*  CREATININE 1.01* 1.00  GLUCOSE 215* 211*    Electrolytes  Recent Labs Lab 12/19/15 0142  CALCIUM 9.4    CBC  Recent Labs Lab 12/19/15 0142 12/19/15 0150  WBC 13.4*  --   HGB 14.0 15.0  HCT 44.2 44.0  PLT 202  --     Coag's  Recent Labs Lab 12/19/15 0518  INR 1.04    Sepsis Markers No results for input(s): LATICACIDVEN, PROCALCITON, O2SATVEN in the last 168 hours.  ABG No results for input(s): PHART, PCO2ART, PO2ART in the last 168 hours.  Liver Enzymes  Recent Labs Lab 12/19/15 0142  AST 25  ALT 16  ALKPHOS 83  BILITOT 1.3*  ALBUMIN 3.5  PULMONARY / CRITICAL CARE MEDICINE   Name: [name] MRN: [id number] DOB: 04/03/1951    ADMISSION DATE:  12/19/2015 CONSULTATION DATE:  12/19/2015  REFERRING MD:  EDP Dr. Pollina  CHIEF COMPLAINT:  SOB  HISTORY OF PRESENT ILLNESS:   65 year old female with PMH as below, which is significant for Barrett's esophagus, arthritis, anemia, and chronic back pain. She has a history of spinal surgery several years ago, which she just recently had revised 10/31/2015 at [facility]. She underwent revision of posterior spinal fusion on the lumbar facets and on the transverse processes at L4-5, Posterior spinal instrumentation at L4, L5, and S1 with 1 pedicle screw at each level bilaterally, Removal of instrumentation at L5 and S1, and Bilateral hemilaminectomy with decompression of the thecal sac, L2, L3, L4 and L5 nerve roots. She reports not having received "shots in her belly" during that admission like she has in the past. She was discharged to home 2 days later.  Post-discharge course was initially uncomplicated, but during the week of 8/27 she began to develop shortness of breath and experienced several episodes of near syncope. 8/30 early AM dyspnea became too strong and she presented to the emergency department with O2 sats in the 60s and profound work of breathing. She was started on BiPAP with 100% FiO2 and O2 sats  Improved into the 90s. CXR consistent with focal opacification concerning for pulmonary infarct. CT angio demonstrated large saddle PE with RV/LV ratio suggesting significant RV strain. She remained hemodynamically stable. She was started on heparin and PCCM asked to eval.    SUBJECTIVE:  NAD on NIMVS  VITAL SIGNS: BP (!) 133/102   Pulse (!) 102   Temp 97.6 F (36.4 C) (Oral)   Resp 18   Ht 5' 5" (1.651 m)   Wt 259 lb 14.8 oz (117.9 kg)   SpO2 96%   BMI 43.25 kg/m   HEMODYNAMICS:    VENTILATOR SETTINGS: Vent Mode: BIPAP;PSV FiO2 (%):  [40 %-100 %] 50  % Set Rate:  [0 bmp-8 bmp] 0 bmp PEEP:  [6 cmH20-8 cmH20] 8 cmH20 Pressure Support:  [8 cmH20] 8 cmH20  INTAKE / OUTPUT: I/O last 3 completed shifts: In: 33.6 [I.V.:33.6] Out: -   PHYSICAL EXAMINATION: General:  Morbidly obese female on BiPAP, NAD Neuro:  Alert, oriented, non-focal HEENT:  [place]/At, PERRL, no JVd Cardiovascular:  Sinus regular. Chronic BLE edema Lungs:  Clear bilateral breath sounds, comfortable on BiPAP Abdomen:  Soft, non-tender, non-distended Musculoskeletal:  No acute deformity or ROM limitation Skin:  Grossly intact, cool feet  LABS:  BMET  Recent Labs Lab 12/19/15 0142 12/19/15 0150  NA 137 140  K 3.8 3.9  CL 103 104  CO2 23  --   BUN 19 23*  CREATININE 1.01* 1.00  GLUCOSE 215* 211*    Electrolytes  Recent Labs Lab 12/19/15 0142  CALCIUM 9.4    CBC  Recent Labs Lab 12/19/15 0142 12/19/15 0150  WBC 13.4*  --   HGB 14.0 15.0  HCT 44.2 44.0  PLT 202  --     Coag's  Recent Labs Lab 12/19/15 0518  INR 1.04    Sepsis Markers No results for input(s): LATICACIDVEN, PROCALCITON, O2SATVEN in the last 168 hours.  ABG No results for input(s): PHART, PCO2ART, PO2ART in the last 168 hours.  Liver Enzymes  Recent Labs Lab 12/19/15 0142  AST 25  ALT 16  ALKPHOS 83  BILITOT 1.3*  ALBUMIN 3.5    

## 2015-12-19 NOTE — Progress Notes (Signed)
Pt requesting to come off bipap at this time. Pt placed on 3L [place] with no distress, no increased WOB, SPO2 93%. Pt knows to let RN know if she feels she needs bipap at any time today. RT will continue to monitor.

## 2015-12-19 NOTE — ED Provider Notes (Signed)
[place] DEPT Provider Note   CSN: [id number] Arrival date & time: 12/19/15  [id number]  By signing my name below, I, [name], attest that this documentation has been prepared under the direction and in the presence of Destiny Greek, MD. Electronically Signed: Judithann [name], ED Scribe. 12/19/15. 12:50 AM.    History   Chief Complaint Chief Complaint  Patient presents with  . Shortness of Breath   HPI Comments: [name] is a 65 y.o. female who presents to the Emergency Department for evaluation s/p sudden onset of ongoing moderate respiratory distress onset PTA. She notes that she feels as though she is "drowning". Pt presents with bilateral leg swelling which she states is her baseline. She denies a hx of CHF or COPD. No alleviating factors noted. Pt did not try any medications prior to EMS arrival. She received 2 g of magnesium, 125 solu-medrol, 1 sublingual NTG, and 1 albuterol en route. She reports an allergy to Cipro. She states that she recently had back surgery. She denies any n/v/d, cough, or chest pain.   The history is provided by the patient. No language interpreter was used.    Past Medical History:  Diagnosis Date  . Anemia   . Barrett's esophagus   . Murmur   . OA (osteoarthritis)   . Obese   . Plantar fasciitis     Patient Active Problem List   Diagnosis Date Noted  . Murmur 11/12/2010  . Chest pain 11/12/2010  . Palpitations 11/12/2010    Past Surgical History:  Procedure Laterality Date  . ABDOMINAL HYSTERECTOMY    . Arthroscopic surgery left knee    . BACK SURGERY    . CESAREAN SECTION    . CHOLECYSTECTOMY    . Right knee replacement    . TONSILLECTOMY      OB History    No data available       Home Medications    Prior to Admission medications   Medication Sig Start Date End Date Taking? Authorizing Provider  doxycycline (VIBRA-TABS) 100 MG tablet Take 100 mg by mouth 2 (two) times daily.   Yes Historical  Provider, MD  HYDROcodone-acetaminophen (NORCO/VICODIN) 5-325 MG tablet Take 1 tablet by mouth every 6 (six) hours as needed for moderate pain.   Yes Historical Provider, MD    Family History Family History  Problem Relation Age of Onset  . Stroke Maternal Grandmother     Social History Social History  Substance Use Topics  . Smoking status: Never Smoker  . Smokeless tobacco: Not on file  . Alcohol use No     Allergies   Ciprofloxacin and Strawberry extract   Review of Systems Review of Systems  Respiratory: Positive for shortness of breath. Negative for cough.   Cardiovascular: Positive for leg swelling. Negative for chest pain.  Gastrointestinal: Negative for diarrhea, nausea and vomiting.  All other systems reviewed and are negative.    Physical Exam Updated Vital Signs BP 127/92   Pulse 117   Resp 20   Ht 5\' 2"  (1.575 m)   Wt 260 lb (117.9 kg)   SpO2 98%   BMI 47.55 kg/m   Physical Exam  Constitutional: She is oriented to person, place, and time. She appears well-developed and well-nourished. She appears distressed.  HENT:  Head: Normocephalic and atraumatic.  Right Ear: Hearing normal.  Left Ear: Hearing normal.  Nose: Nose normal.  Mouth/Throat: Oropharynx is clear and moist and mucous membranes are normal.  Eyes: Conjunctivae and  EOM are normal. Pupils are equal, round, and reactive to light.  Neck: Normal range of motion. Neck supple.  Cardiovascular: Regular rhythm, S1 normal and S2 normal.  Exam reveals no gallop and no friction rub.   No murmur heard. Pulmonary/Chest: Accessory muscle usage present. Tachypnea noted. She is in respiratory distress. She has rales. She exhibits no tenderness.  Abdominal: Soft. Normal appearance and bowel sounds are normal. There is no hepatosplenomegaly. There is no tenderness. There is no rebound, no guarding, no tenderness at McBurney's point and negative Murphy's sign. No hernia.  Musculoskeletal: Normal range of  motion.  Non-pitting edema BLE  Neurological: She is alert and oriented to person, place, and time. She has normal strength. No cranial nerve deficit or sensory deficit. Coordination normal. GCS eye subscore is 4. GCS verbal subscore is 5. GCS motor subscore is 6.  Skin: Skin is warm, dry and intact. No rash noted. No cyanosis.  Psychiatric: She has a normal mood and affect. Her speech is normal and behavior is normal. Thought content normal.  Nursing note and vitals reviewed.    ED Treatments / Results  DIAGNOSTIC STUDIES: Oxygen Saturation is 85% on [place], low by my interpretation.    COORDINATION OF CARE: 12:43 AM- Pt advised of plan for treatment and pt agrees. She will receive lab work and chest x-ray for further evaluation.   Labs (all labs ordered are listed, but only abnormal results are displayed) Labs Reviewed  CBC - Abnormal; Notable for the following:       Result Value   WBC 13.4 (*)    RBC 5.19 (*)    All other components within normal limits  COMPREHENSIVE METABOLIC PANEL - Abnormal; Notable for the following:    Glucose, Bld 215 (*)    Creatinine, Ser 1.01 (*)    Total Bilirubin 1.3 (*)    GFR calc non Af Amer 57 (*)    All other components within normal limits  D-DIMER, QUANTITATIVE (NOT AT [facility]) - Abnormal; Notable for the following:    D-Dimer, Quant >20.00 (*)    All other components within normal limits  I-STAT TROPOININ, ED - Abnormal; Notable for the following:    Troponin i, poc 0.26 (*)    All other components within normal limits  I-STAT CHEM 8, ED - Abnormal; Notable for the following:    BUN 23 (*)    Glucose, Bld 211 (*)    Calcium, Ion 1.13 (*)    All other components within normal limits  BRAIN NATRIURETIC PEPTIDE  ANTITHROMBIN III  PROTEIN C ACTIVITY  PROTEIN C, TOTAL  PROTEIN S ACTIVITY  PROTEIN S, TOTAL  LUPUS ANTICOAGULANT PANEL  BETA-2-GLYCOPROTEIN I ABS, IGG/M/A  HOMOCYSTEINE  FACTOR 5 LEIDEN  PROTHROMBIN GENE MUTATION  CARDIOLIPIN  ANTIBODIES, IGG, IGM, IGA  HEPARIN LEVEL (UNFRACTIONATED)    EKG  EKG Interpretation  Date/Time:  Wednesday December 19 2015 00:50:19 EDT Ventricular Rate:  133 PR Interval:    QRS Duration: 112 QT Interval:  328 QTC Calculation: 488 R Axis:   -51 Text Interpretation:  Sinus tachycardia Incomplete RBBB and LAFB, new since 2008 Low voltage, precordial leads Abnormal R-wave progression, late transition Borderline prolonged QT interval Confirmed by Ebony Rickel  MD, Burnis Halling [phone]) on 12/19/2015 1:00:46 AM       Radiology Ct Angio Chest Pe W Or Wo Contrast  Result Date: 12/19/2015 CLINICAL DATA:  Dyspnea, sudden onset. EXAM: CT ANGIOGRAPHY CHEST WITH CONTRAST TECHNIQUE: Multidetector CT imaging of the chest was performed  using the standard protocol during bolus administration of intravenous contrast. Multiplanar CT image reconstructions and MIPs were obtained to evaluate the vascular anatomy. CONTRAST:  100 mL Isovue 370 intravenous COMPARISON:  Radiograph 12/19/2015 FINDINGS: Cardiovascular: There is a very large volume pulmonary embolus. There is a large central saddle at the bifurcation of the main pulmonary artery and multiple secondary saddles at subsequent bifurcations. There is CT evidence of right heart strain. Lungs: Dense consolidation in the right upper lobe posteriorly, right lower lobe posteriorly. This could represent infarction. Pneumonia or hemorrhage not excluded. Left lung is clear except for minor patchy opacities. Central airways: Patent Effusions: None Lymphadenopathy: None Esophagus: Unremarkable Upper abdomen: No significant abnormality Musculoskeletal: No significant skeletal lesion Review of the MIP images confirms the above findings. IMPRESSION: 1. Positive for acute PE with CT evidence of right heart strain (RV/LV Ratio = 2) consistent with at least submassive (intermediate risk) PE. The presence of right heart strain has been associated with an increased risk of morbidity  and mortality. Please activate Code PE by paging [phone]. 2. Large area of consolidation in the posterior aspect of the right upper lobe and right lower lobe superior segment, extending into the right posteromedial base. This could represent pulmonary infarction. Pneumonia or hemorrhage not excluded. 3. These results were called by telephone at the time of interpretation on 12/19/2015 at 4:11 am to Dr. Joseph Berkshire , who verbally acknowledged these results. Electronically Signed   By: Andreas Newport M.D.   On: 12/19/2015 04:13   Dg Chest Portable 1 View  Result Date: 12/19/2015 CLINICAL DATA:  Shortness of breath tonight. EXAM: PORTABLE CHEST 1 VIEW COMPARISON:  01/14/2007 FINDINGS: New right hilar opacity could represent focal consolidation or developing mass lesion. If patient's symptoms are consistent with pneumonia, would suggest follow-up examination in 4-6 weeks after appropriate therapy. Otherwise, CT is suggested for further evaluation. Left lung is clear and expanded. Normal heart size and pulmonary vascularity. No blunting of costophrenic angles. No pneumothorax. IMPRESSION: New masslike opacity in the right hilar region could represent pneumonia or mass lesion. See followup recommendations above. Electronically Signed   By: Lucienne Capers M.D.   On: 12/19/2015 01:05    Procedures Procedures (including critical care time)  Medications Ordered in ED Medications  heparin bolus via infusion 5,500 Units (not administered)  heparin ADULT infusion 100 units/mL (25000 units/243mL sodium chloride 0.45%) (not administered)  iopamidol (ISOVUE-370) 76 % injection (100 mLs  Contrast Given 12/19/15 0315)     Initial Impression / Assessment and Plan / ED Course  Destiny Greek, MD has reviewed the triage vital signs and the nursing notes.  Pertinent labs & imaging results that were available during my care of the patient were reviewed by me and considered in my medical decision  making (see chart for details).  Clinical Course    Patient presents to the emergency department with complaints of shortness of breath. Patient had sudden onset of severe shortness of breath tonight. Patient arrives in severe distress. She is extremely hypoxic on room air but improved with BiPAP. EMS report wheezing, but none is present on arrival. She does not have a history of COPD or asthma. Blood pressure was elevated for EMS, was given 1 sublingual nitroglycerin for this as well as the possibility of pulmonary edema. Blood pressure has remained essentially normotensive here in the ER. Chest x-ray showed masslike density in the right lung. I was suspicious for possible infarct based on her presentation. CT angiography was performed  and does confirm large PE with right heart strain. Patient initiated on IV heparin.  Case discussed with Dr. Oletta Darter, on call for critical care. Patient will be evaluated in the ER for admission and determination of interventional radiology versus systemic thrombolytic therapy.  CRITICAL CARE Performed by: [name]   Total critical care time: 35 minutes  Critical care time was exclusive of separately billable procedures and treating other patients.  Critical care was necessary to treat or prevent imminent or life-threatening deterioration.  Critical care was time spent personally by me on the following activities: development of treatment plan with patient and/or surrogate as well as nursing, discussions with consultants, evaluation of patient's response to treatment, examination of patient, obtaining history from patient or surrogate, ordering and performing treatments and interventions, ordering and review of laboratory studies, ordering and review of radiographic studies, pulse oximetry and re-evaluation of patient's condition.   Final Clinical Impressions(s) / ED Diagnoses   Final diagnoses:  PE (pulmonary embolism)    New  Prescriptions New Prescriptions   No medications on file   I personally performed the services described in this documentation, which was scribed in my presence. The recorded information has been reviewed and is accurate.    Destiny Greek, MD 12/19/15 [phone]

## 2015-12-19 NOTE — H&P (Signed)
PULMONARY / CRITICAL CARE MEDICINE   Name: [name] MRN: [id number] DOB: 01/19/1951    ADMISSION DATE:  12/19/2015 CONSULTATION DATE:  12/19/2015  REFERRING MD:  EDP Dr. Blinda Leatherwood  CHIEF COMPLAINT:  SOB  HISTORY OF PRESENT ILLNESS:   65 year old female with PMH as below, which is significant for Barrett's esophagus, arthritis, anemia, and chronic back pain. She has a history of spinal surgery several years ago, which she just recently had revised 10/31/2015 at [facility]. She underwent revision of posterior spinal fusion on the lumbar facets and on the transverse processes at L4-5, Posterior spinal instrumentation at L4, L5, and S1 with 1 pedicle screw at each level bilaterally, Removal of instrumentation at L5 and S1, and Bilateral hemilaminectomy with decompression of the thecal sac, L2, L3, L4 and L5 nerve roots. She reports not having received "shots in her belly" during that admission like she has in the past. She was discharged to home 2 days later.  Post-discharge course was initially uncomplicated, but during the week of 8/27 she began to develop shortness of breath and experienced several episodes of near syncope. 8/30 early AM dyspnea became too strong and she presented to the emergency department with O2 sats in the 60s and profound work of breathing. She was started on BiPAP with 100% FiO2 and O2 sats  Improved into the 90s. CXR consistent with focal opacification concerning for pulmonary infarct. CT angio demonstrated large saddle PE with RV/LV ratio suggesting significant RV strain. She remained hemodynamically stable. She was started on heparin and PCCM asked to eval.    PAST MEDICAL HISTORY :  Past Medical History:  Diagnosis Date  . Anemia   . Barrett's esophagus   . Murmur   . OA (osteoarthritis)   . Obese   . Plantar fasciitis     PAST SURGICAL HISTORY: Past Surgical History:  Procedure Laterality Date  . ABDOMINAL HYSTERECTOMY    . Arthroscopic  surgery left knee    . BACK SURGERY    . CESAREAN SECTION    . CHOLECYSTECTOMY    . Right knee replacement    . TONSILLECTOMY       Allergies  Allergen Reactions  . Ciprofloxacin Anaphylaxis  . Strawberry Extract Anaphylaxis    No current facility-administered medications on file prior to encounter.    No current outpatient prescriptions on file prior to encounter.    FAMILY HISTORY:  Family History  Problem Relation Age of Onset  . Stroke Maternal Grandmother     SOCIAL HISTORY: Social History  Substance Use Topics  . Smoking status: Never Smoker  . Smokeless tobacco: Not on file  . Alcohol use No    REVIEW OF SYSTEMS:   Bolds are positive  Constitutional: weight loss, gain, night sweats, Fevers, chills, fatigue .  HEENT: headaches, Sore throat, sneezing, nasal congestion, post nasal drip, Difficulty swallowing, Tooth/dental problems, visual complaints visual changes, ear ache CV:  chest pain, radiates: ,Orthopnea, PND, swelling in lower extremities, which is chronic, dizziness, palpitations, syncope.  GI  heartburn, indigestion, abdominal pain, nausea, vomiting, diarrhea, change in bowel habits, loss of appetite, bloody stools.  Resp: cough, productive: , hemoptysis, dyspnea, chest pain, pleuritic.  Skin: rash or itching or icterus GU: dysuria, change in color of urine, urgency or frequency. flank pain, hematuria  MS: joint pain or swelling. decreased range of motion  Psych: change in mood or affect. depression or anxiety.  Neuro: difficulty with speech, weakness, numbness, ataxia  SUBJECTIVE:    VITAL SIGNS: BP 134/79   Pulse (!) 122   Resp 19   Ht 5\' 2"  (1.575 m)   Wt 117.9 kg (260 lb)   SpO2 96%   BMI 47.55 kg/m   HEMODYNAMICS:    VENTILATOR SETTINGS: Vent Mode: BIPAP;PSV FiO2 (%):  [40 %-100 %] 100 % Set Rate:  [0 bmp-8 bmp] 0 bmp PEEP:  [6 cmH20-8 cmH20] 8 cmH20 Pressure Support:  [8 cmH20] 8 cmH20  INTAKE / OUTPUT: No intake/output  data recorded.  PHYSICAL EXAMINATION: General:  Morbidly obese female on BiPAP Neuro:  Alert, oriented, non-focal HEENT:  [place]/At, PERRL, no JVd Cardiovascular:  Tachy, regular. Chronic BLE edema Lungs:  Clear bilateral breath sounds, increased WOB even on BiPAP Abdomen:  Soft, non-tender, non-distended Musculoskeletal:  No acute deformity or ROM limitation Skin:  Grossly intact  LABS:  BMET  Recent Labs Lab 12/19/15 0142 12/19/15 0150  NA 137 140  K 3.8 3.9  CL 103 104  CO2 23  --   BUN 19 23*  CREATININE 1.01* 1.00  GLUCOSE 215* 211*    Electrolytes  Recent Labs Lab 12/19/15 0142  CALCIUM 9.4    CBC  Recent Labs Lab 12/19/15 0142 12/19/15 0150  WBC 13.4*  --   HGB 14.0 15.0  HCT 44.2 44.0  PLT 202  --     Coag's No results for input(s): APTT, INR in the last 168 hours.  Sepsis Markers No results for input(s): LATICACIDVEN, PROCALCITON, O2SATVEN in the last 168 hours.  ABG No results for input(s): PHART, PCO2ART, PO2ART in the last 168 hours.  Liver Enzymes  Recent Labs Lab 12/19/15 0142  AST 25  ALT 16  ALKPHOS 83  BILITOT 1.3*  ALBUMIN 3.5    Cardiac Enzymes No results for input(s): TROPONINI, PROBNP in the last 168 hours.  Glucose No results for input(s): GLUCAP in the last 168 hours.  Imaging Ct Angio Chest Pe W Or Wo Contrast  Result Date: 12/19/2015 CLINICAL DATA:  Dyspnea, sudden onset. EXAM: CT ANGIOGRAPHY CHEST WITH CONTRAST TECHNIQUE: Multidetector CT imaging of the chest was performed using the standard protocol during bolus administration of intravenous contrast. Multiplanar CT image reconstructions and MIPs were obtained to evaluate the vascular anatomy. CONTRAST:  100 mL Isovue 370 intravenous COMPARISON:  Radiograph 12/19/2015 FINDINGS: Cardiovascular: There is a very large volume pulmonary embolus. There is a large central saddle at the bifurcation of the main pulmonary artery and multiple secondary saddles at subsequent  bifurcations. There is CT evidence of right heart strain. Lungs: Dense consolidation in the right upper lobe posteriorly, right lower lobe posteriorly. This could represent infarction. Pneumonia or hemorrhage not excluded. Left lung is clear except for minor patchy opacities. Central airways: Patent Effusions: None Lymphadenopathy: None Esophagus: Unremarkable Upper abdomen: No significant abnormality Musculoskeletal: No significant skeletal lesion Review of the MIP images confirms the above findings. IMPRESSION: 1. Positive for acute PE with CT evidence of right heart strain (RV/LV Ratio = 2) consistent with at least submassive (intermediate risk) PE. The presence of right heart strain has been associated with an increased risk of morbidity and mortality. Please activate Code PE by paging [phone]. 2. Large area of consolidation in the posterior aspect of the right upper lobe and right lower lobe superior segment, extending into the right posteromedial base. This could represent pulmonary infarction. Pneumonia or hemorrhage not excluded. 3. These results were called by telephone at the time of interpretation on 12/19/2015 at 4:11 am to  Dr. Jaci Carrel , who verbally acknowledged these results. Electronically Signed   By: Ellery Plunk M.D.   On: 12/19/2015 04:13   Dg Chest Portable 1 View  Result Date: 12/19/2015 CLINICAL DATA:  Shortness of breath tonight. EXAM: PORTABLE CHEST 1 VIEW COMPARISON:  01/14/2007 FINDINGS: New right hilar opacity could represent focal consolidation or developing mass lesion. If patient's symptoms are consistent with pneumonia, would suggest follow-up examination in 4-6 weeks after appropriate therapy. Otherwise, CT is suggested for further evaluation. Left lung is clear and expanded. Normal heart size and pulmonary vascularity. No blunting of costophrenic angles. No pneumothorax. IMPRESSION: New masslike opacity in the right hilar region could represent pneumonia or  mass lesion. See followup recommendations above. Electronically Signed   By: Burman Nieves M.D.   On: 12/19/2015 01:05    STUDIES:  CTA chest 8/30 > Positive for acute PE with CT evidence of right heart strain (RV/LV Ratio = 2). Large area of consolidation. In the posterior aspect of the right upper lobe and right lower lobe superior segment, extending into the right posteromedial base. This could represent pulmonary infarction   CULTURES: None  ANTIBIOTICS: None  SIGNIFICANT EVENTS: 7/12 spinal surgery at [facility] 8/30 admit for massive PE  LINES/TUBES: PIV  DISCUSSION: 65 year old female with recent spinal surgery presenting with acute PE. Submassive/massive on exam. Started on heparin, considering EKOS. High O2 demands and high risk of decompensation. Tenuous status. ICU for close monitoring.    ASSESSMENT / PLAN:  PULMONARY A: Acute hypoxemic respiratory failure secondary to pulmonary embolism Saddle PE  P:   Continue BiPAP support Would likely not survive intubation so will try to avoid Keep 100% FiO2 Close monitoring See Heme section  CARDIOVASCULAR A:  At risk obstructive shock  P:  Telemetry monitoring  Gentle IV fluid maintenance Trend troponin, lactic acid  RENAL A:   Acute renal risk  P:   Monitor renal function and UOP Correct electrolytes as indicated Gentle hydration  GASTROINTESTINAL A:   Barrett's esophagus  P:   NPO  Pepcid IV BID for SUP  HEMATOLOGIC A:   Massive/submassive saddle pulmonary embolism - provoked in setting of recent surgery  P:  Heparin infusion Patient considering directed thrombolysis IR has been consulted and will see Echocardiogram Doppler legs  Follow hypercoagulable workup.   INFECTIOUS A:   Leukocytosis - likely stress response in setting PE  P:   Follow WBC and fever curve  ENDOCRINE A:   Hyperglycemia without history of DM  P:   CBG monitoring and SSI q 4 hours  NEUROLOGIC A:   No  acute issues  P:   Monitor    FAMILY  - Updates: patient and husband updated by PH/JN in ED 8/30  - Inter-disciplinary family meet or Palliative Care meeting due by:  9/6   Joneen Roach, AGACNP-BC [place] [facility]/Critical [facility] Pager [phone] or [phone] 12/19/2015 6:00 AM   PCCM Attending Note: Seen and examined with nurse practitioner. Please refer to his admission H&P which I have reviewed. Patient presenting with acute hypoxic respiratory failure secondary to saddle pulmonary embolus. Patient has had near syncopal events. Profound hypoxia with ongoing tachycardia despite normotension. CT angiogram of the chest reviewed by myself showing saddle pulmonary embolus with evidence of right ventricular strain. Troponin I mildly elevated as well. I had a lengthy discussion with the patient and her husband at bedside explaining her high probability of further clinical worsening as well as potential death with attempted  endotracheal intubation if her respiratory status declines further. We discussed treatment options including IV TPA, continuing systemic anticoagulation, & catheter directed lytic therapy. I explained that there is risk of bleeding with all of these treatment options including bleeding that is unseen as well as postoperative bleeding from her previous spinal surgery that could potentially result in paralysis. However, her surgery was on 7/12 and I feel her bleeding risk wall more than baseline is certainly within the realm of acceptable risk. I did discuss the case with interventional radiology who will talk further with the patient as we continue to support her through her critical illness. She is to remain full code.  I have spent a total of 41 minutes of critical care time today caring for the patient, updating patient/husband at bedside as well as discussing plan of care, reviewing patient's electronic medical record, & discussing plan of care with interventional  radiology.  Donna Christen Jamison Neighbor, M.D. [facility] Pulmonary & Critical Care Pager:  [phone] After 3pm or if no response, call [phone] 6:10 AM 12/19/15

## 2015-12-19 NOTE — Consult Note (Signed)
Chief Complaint: Acute bilateral pulmonary emboli (saddle embolus) with right heart strain and acute respiratory failure.  Referring Physician(s): Tera Partridge   History of Present Illness: [name] is a 65 y.o. female with morbid obesity and a remote history of Barrett's esophagus who presented to the emergency room with chest discomfort and shortness of breath. She is 7 weeks post lower lumbar fusion surgery at [facility] hospital. Emergent CTA demonstrates saddle embolus with CT evidence of heart strain. LV/RV ratio 2.0. Troponin is mildly elevated. She is also tachycardic and tachypnea. Hemodynamically she is mildly hypertensive. She appears uncomfortable and anxious in the bed.  Past Medical History:  Diagnosis Date  . Anemia   . Barrett's esophagus   . Murmur   . OA (osteoarthritis)   . Obese   . Plantar fasciitis     Past Surgical History:  Procedure Laterality Date  . ABDOMINAL HYSTERECTOMY    . Arthroscopic surgery left knee    . BACK SURGERY    . CESAREAN SECTION    . CHOLECYSTECTOMY    . Right knee replacement    . TONSILLECTOMY      Allergies: Ciprofloxacin and Strawberry extract  Medications: Prior to Admission medications   Medication Sig Start Date End Date Taking? Authorizing Provider  doxycycline (VIBRA-TABS) 100 MG tablet Take 100 mg by mouth 2 (two) times daily.   Yes Historical Provider, MD  HYDROcodone-acetaminophen (NORCO/VICODIN) 5-325 MG tablet Take 1 tablet by mouth every 6 (six) hours as needed for moderate pain.   Yes Historical Provider, MD     Family History  Problem Relation Age of Onset  . Stroke Maternal Grandmother     Social History   Social History  . Marital status: Married    Spouse name: N/A  . Number of children: 3  . Years of education: N/A   Occupational History  .      Telephone sales   Social History Main Topics  . Smoking status: Never Smoker  . Smokeless tobacco: None  . Alcohol  use No  . Drug use: Unknown  . Sexual activity: Not Asked   Other Topics Concern  . None   Social History Narrative  . None     Review of Systems: A 12 point ROS discussed and pertinent positives are indicated in the HPI above.  All other systems are negative.  Review of Systems  Constitutional: Positive for fatigue. Negative for fever and unexpected weight change.  Respiratory: Positive for chest tightness and shortness of breath.   Cardiovascular: Positive for chest pain. Negative for palpitations.  Gastrointestinal: Negative for abdominal distention, abdominal pain, anal bleeding, diarrhea, nausea and vomiting.  Genitourinary: Negative for hematuria and vaginal bleeding.    Vital Signs: BP 136/86   Pulse 117   Temp 97.6 F (36.4 C) (Oral)   Resp 20   Ht 5\' 2"  (1.575 m)   Wt 260 lb (117.9 kg)   SpO2 98%   BMI 47.55 kg/m   Physical Exam  Constitutional: She is oriented to person, place, and time.  Obese female lying in the bed appearing anxious, uncomfortable, and having difficulty breathing. On BiPAP.Marland Kitchen  Cardiovascular: Regular rhythm and normal heart sounds.   No murmur heard. Tachycardic  Pulmonary/Chest: She is in respiratory distress. She has no wheezes. She has no rales.  Abdominal: Soft. Bowel sounds are normal. She exhibits no distension. There is no tenderness.  Neurological: She is alert and oriented to  person, place, and time.  Skin: Skin is warm and dry.     Imaging: Ct Angio Chest Pe W Or Wo Contrast  Result Date: 12/19/2015 CLINICAL DATA:  Dyspnea, sudden onset. EXAM: CT ANGIOGRAPHY CHEST WITH CONTRAST TECHNIQUE: Multidetector CT imaging of the chest was performed using the standard protocol during bolus administration of intravenous contrast. Multiplanar CT image reconstructions and MIPs were obtained to evaluate the vascular anatomy. CONTRAST:  100 mL Isovue 370 intravenous COMPARISON:  Radiograph 12/19/2015 FINDINGS: Cardiovascular: There is a very  large volume pulmonary embolus. There is a large central saddle at the bifurcation of the main pulmonary artery and multiple secondary saddles at subsequent bifurcations. There is CT evidence of right heart strain. Lungs: Dense consolidation in the right upper lobe posteriorly, right lower lobe posteriorly. This could represent infarction. Pneumonia or hemorrhage not excluded. Left lung is clear except for minor patchy opacities. Central airways: Patent Effusions: None Lymphadenopathy: None Esophagus: Unremarkable Upper abdomen: No significant abnormality Musculoskeletal: No significant skeletal lesion Review of the MIP images confirms the above findings. IMPRESSION: 1. Positive for acute PE with CT evidence of right heart strain (RV/LV Ratio = 2) consistent with at least submassive (intermediate risk) PE. The presence of right heart strain has been associated with an increased risk of morbidity and mortality. Please activate Code PE by paging [phone]. 2. Large area of consolidation in the posterior aspect of the right upper lobe and right lower lobe superior segment, extending into the right posteromedial base. This could represent pulmonary infarction. Pneumonia or hemorrhage not excluded. 3. These results were called by telephone at the time of interpretation on 12/19/2015 at 4:11 am to Dr. Joseph Berkshire , who verbally acknowledged these results. Electronically Signed   By: Andreas Newport M.D.   On: 12/19/2015 04:13   Dg Chest Portable 1 View  Result Date: 12/19/2015 CLINICAL DATA:  Shortness of breath tonight. EXAM: PORTABLE CHEST 1 VIEW COMPARISON:  01/14/2007 FINDINGS: New right hilar opacity could represent focal consolidation or developing mass lesion. If patient's symptoms are consistent with pneumonia, would suggest follow-up examination in 4-6 weeks after appropriate therapy. Otherwise, CT is suggested for further evaluation. Left lung is clear and expanded. Normal heart size and  pulmonary vascularity. No blunting of costophrenic angles. No pneumothorax. IMPRESSION: New masslike opacity in the right hilar region could represent pneumonia or mass lesion. See followup recommendations above. Electronically Signed   By: Lucienne Capers M.D.   On: 12/19/2015 01:05    Labs:  CBC:  Recent Labs  09/24/15 0830 12/19/15 0142 12/19/15 0150  WBC 5.3 13.4*  --   HGB  --  14.0 15.0  HCT 39.4 44.2 44.0  PLT 199 202  --     COAGS:  Recent Labs  12/19/15 0518  INR 1.04    BMP:  Recent Labs  09/24/15 0830 12/19/15 0142 12/19/15 0150  NA 139 137 140  K 4.3 3.8 3.9  CL 99 103 104  CO2  --  23  --   GLUCOSE 116* 215* 211*  BUN 15 19 23*  CALCIUM 9.0 9.4  --   CREATININE 0.79 1.01* 1.00  GFRNONAA 79 57*  --   GFRAA 91 >60  --     LIVER FUNCTION TESTS:  Recent Labs  09/24/15 0830 12/19/15 0142  BILITOT 0.8 1.3*  AST 16 25  ALT 8 16  ALKPHOS 65 83  PROT 6.6 7.3  ALBUMIN 3.7 3.5   Assessment and Plan:  Acute saddle embolus  with evidence of right heart strain by CT. Patient is in acute respiratory distress on BiPAP. Imaging findings compatible with acute submassive pulmonary emboli. The PE lysis procedure was reviewed with the patient. The procedure, risks, benefits and alternatives were reviewed. The risk of bleeding remains very low. She is 7 weeks from the lumbar fusion. All questions were addressed. After discussion she would like to proceed with the procedure. Consent was obtained.  Plan: Proceed with emergent EKOS PE lysis protocol.  Thank you for this interesting consult.  I greatly enjoyed meeting AUBREELYNN BURTNESS and look forward to participating in their care.  A copy of this report was sent to the requesting provider on this date.  Electronically Signed: Greggory Keen 12/19/2015, 7:09 AM   I spent a total of 40 Minutes    in face to face in clinical consultation, greater than 50% of which was counseling/coordinating care for this  patient with acute saddle embolus and heart strain with respiratory distress

## 2015-12-19 NOTE — ED Triage Notes (Signed)
Per EMS pt had sudden onset of SOB with cough tonight; Pt states no known hx and denies chest pain; Pt a&ox 4 on arrival; pt states she started Doxycyline on 12/18/15 for ear infection and had back surgery about 7 weeks ago. Pt able to move self to stretcher; Pt received 1 nitro, 125 mg solumedrol, 5 mg albuterol, and mg of mag. Pt intial sats on EMS arrival at 65% RA

## 2015-12-19 NOTE — Progress Notes (Signed)
Placed patient on BiPAP after arrival with EMS on CPAP, patient is tolerating it well but showing signs of anxiety. RT will continue to monitor.

## 2015-12-19 NOTE — Care Management Note (Signed)
Case Management Note  Patient Details  Name: [name] MRN: [id number] Date of Birth: 11-06-1950  Subjective/Objective:  Pt admitted with SOB and several episodes of near syncope -found tohave large saddle PE with RV/LV ratio suggesting significant heart strain.                  Action/Plan:  Pt underwent spinal surgery revision at [facility] regional in July of 2017 - discharged home.  CM will continue to follow for discharge needs.   Expected Discharge Date:                  Expected Discharge Plan:  [place]  In-House Referral:     Discharge planning Services  CM Consult  Post Acute Care Choice:    Choice offered to:     DME Arranged:    DME Agency:     HH Arranged:    [place] Agency:     Status of Service:  In process, will continue to follow  If discussed at Long Length of Stay Meetings, dates discussed:    Additional Comments:  Maryclare Labrador, RN 12/19/2015, 10:26 AM

## 2015-12-19 NOTE — Sedation Documentation (Signed)
R pulm artery pressure 40/24 (30)

## 2015-12-19 NOTE — Procedures (Signed)
Acute saddle embolus with right heart strain and respiratory distress.  Status post bilateral pulmonary arterial EKOS  infusion catheters to begin PE lysis protocol  EBL 0  No immediate complication  Left PA pressure: 36/26 (31) Right PA pressure: 40/24 (30)  Full report in PACs

## 2015-12-19 NOTE — Sedation Documentation (Signed)
Patient is resting. Pt alert and responds to questions appropriately

## 2015-12-19 NOTE — ED Notes (Signed)
Pt very anxious and husband very anxious about the pt's breathing, pt encouraged to get deep breaths in and out slowly, pt verbalized understanding.

## 2015-12-19 NOTE — Progress Notes (Signed)
Transported pt to and from 2M15 to IR 2 on ventilator. Pt stable throughout with no complications. RT will continue to monitor.

## 2015-12-20 ENCOUNTER — Inpatient Hospital Stay (HOSPITAL_COMMUNITY): Payer: No Typology Code available for payment source

## 2015-12-20 ENCOUNTER — Encounter (HOSPITAL_COMMUNITY): Payer: Self-pay | Admitting: Interventional Radiology

## 2015-12-20 DIAGNOSIS — I2699 Other pulmonary embolism without acute cor pulmonale: Secondary | ICD-10-CM

## 2015-12-20 DIAGNOSIS — I2692 Saddle embolus of pulmonary artery without acute cor pulmonale: Secondary | ICD-10-CM

## 2015-12-20 HISTORY — PX: IR GENERIC HISTORICAL: IMG1180011

## 2015-12-20 LAB — ECHOCARDIOGRAM COMPLETE
E decel time: 246 msec
E/e' ratio: 9.59
FS: 30 % (ref 28–44)
Height: 65 in
IVS/LV PW RATIO, ED: 1.04
LA ID, A-P, ES: 38 mm
LA diam end sys: 38 mm
LA diam index: 1.7 cm/m2
LA vol A4C: 31.6 ml
LA vol index: 19.3 mL/m2
LA vol: 43.3 mL
LV E/e' medial: 9.59
LV E/e'average: 9.59
LV PW d: 10.1 mm — AB (ref 0.6–1.1)
LV e' LATERAL: 8.7 cm/s
LVOT area: 3.14 cm2
LVOT diameter: 20 mm
Lateral S' vel: 11.5 cm/s
MV Dec: 246
MV Peak grad: 3 mmHg
MV pk A vel: 101 m/s
MV pk E vel: 83.4 m/s
RV sys press: 29 mmHg
Reg peak vel: 255 cm/s
TAPSE: 26.7 mm
TDI e' lateral: 8.7
TDI e' medial: 8.7
TR max vel: 255 cm/s
Weight: 4289.27 oz

## 2015-12-20 LAB — CARDIOLIPIN ANTIBODIES, IGG, IGM, IGA
Anticardiolipin IgA: <9 APL U/mL (ref 0–11)
Anticardiolipin IgG: 10 GPL U/mL (ref 0–14)
Anticardiolipin IgM: 21 MPL U/mL — ABNORMAL HIGH (ref 0–12)

## 2015-12-20 LAB — GLUCOSE, CAPILLARY
Glucose-Capillary: 114 mg/dL — ABNORMAL HIGH (ref 65–99)
Glucose-Capillary: 123 mg/dL — ABNORMAL HIGH (ref 65–99)
Glucose-Capillary: 129 mg/dL — ABNORMAL HIGH (ref 65–99)
Glucose-Capillary: 138 mg/dL — ABNORMAL HIGH (ref 65–99)
Glucose-Capillary: 123 mg/dL — ABNORMAL HIGH (ref 65–99)

## 2015-12-20 LAB — HEPARIN LEVEL (UNFRACTIONATED)
Heparin Unfractionated: 0.8 IU/mL — ABNORMAL HIGH (ref 0.30–0.70)
Heparin Unfractionated: 0.14 IU/mL — ABNORMAL LOW (ref 0.30–0.70)

## 2015-12-20 LAB — BASIC METABOLIC PANEL
Anion gap: 5 (ref 5–15)
BUN: 12 mg/dL (ref 6–20)
CO2: 23 mmol/L (ref 22–32)
Calcium: 8.1 mg/dL — ABNORMAL LOW (ref 8.9–10.3)
Chloride: 112 mmol/L — ABNORMAL HIGH (ref 101–111)
Creatinine, Ser: 0.79 mg/dL (ref 0.44–1.00)
GFR calc Af Amer: >60 mL/min (ref >60)
GFR calc non Af Amer: >60 mL/min (ref >60)
Glucose, Bld: 122 mg/dL — ABNORMAL HIGH (ref 65–99)
Potassium: 3.7 mmol/L (ref 3.5–5.1)
Sodium: 140 mmol/L (ref 135–145)

## 2015-12-20 LAB — BETA-2-GLYCOPROTEIN I ABS, IGG/M/A
Beta-2 Glyco I IgG: <9 GPI IgG units (ref 0–20)
Beta-2-Glycoprotein I IgA: 25 GPI IgA units (ref 0–25)
Beta-2-Glycoprotein I IgM: <9 GPI IgM units (ref 0–32)

## 2015-12-20 LAB — CBC
HCT: 33.1 % — ABNORMAL LOW (ref 36.0–46.0)
Hemoglobin: 10.6 g/dL — ABNORMAL LOW (ref 12.0–15.0)
MCH: 26.8 pg (ref 26.0–34.0)
MCHC: 32 g/dL (ref 30.0–36.0)
MCV: 83.8 fL (ref 78.0–100.0)
Platelets: 125 10*3/uL — ABNORMAL LOW (ref 150–400)
RBC: 3.95 MIL/uL (ref 3.87–5.11)
RDW: 13.7 % (ref 11.5–15.5)
WBC: 10.9 10*3/uL — ABNORMAL HIGH (ref 4.0–10.5)

## 2015-12-20 LAB — LUPUS ANTICOAGULANT PANEL
DRVVT: 39.9 s (ref 0.0–47.0)
PTT Lupus Anticoagulant: 46.6 s (ref 0.0–51.9)

## 2015-12-20 LAB — HOMOCYSTEINE: Homocysteine: 18.6 umol/L — ABNORMAL HIGH (ref 0.0–15.0)

## 2015-12-20 LAB — PROTIME-INR
INR: 1.29
Prothrombin Time: 16.2 seconds — ABNORMAL HIGH (ref 11.4–15.2)

## 2015-12-20 LAB — MAGNESIUM: Magnesium: 2 mg/dL (ref 1.7–2.4)

## 2015-12-20 LAB — FIBRINOGEN: Fibrinogen: 263 mg/dL (ref 210–475)

## 2015-12-20 LAB — PHOSPHORUS: Phosphorus: 3.3 mg/dL (ref 2.5–4.6)

## 2015-12-20 LAB — SEDIMENTATION RATE: Sed Rate: 26 mm/hr — ABNORMAL HIGH (ref 0–22)

## 2015-12-20 LAB — TROPONIN I: Troponin I: 0.98 ng/mL (ref <0.03)

## 2015-12-20 LAB — PROTEIN S, TOTAL: Protein S Ag, Total: 147 % (ref 60–150)

## 2015-12-20 LAB — APTT: aPTT: 181 seconds (ref 24–36)

## 2015-12-20 LAB — PROTEIN S ACTIVITY: Protein S Activity: 120 % (ref 63–140)

## 2015-12-20 LAB — PROTEIN C ACTIVITY: Protein C Activity: 141 % (ref 73–180)

## 2015-12-20 LAB — PROTEIN C, TOTAL: Protein C, Total: 97 % (ref 60–150)

## 2015-12-20 MED ORDER — HEPARIN (PORCINE) IN NACL 100-0.45 UNIT/ML-% IJ SOLN
1500.0000 [IU]/h | INTRAMUSCULAR | Status: DC
  Administered 2015-12-20 (×2): 1250 [IU]/h via INTRAVENOUS
  Administered 2015-12-21: 1500 [IU]/h via INTRAVENOUS
  Filled 2015-12-20 (×2): qty 250

## 2015-12-20 NOTE — Progress Notes (Signed)
MD made aware of critical APTT of 181. No no orders at this time. Will continue to monitor

## 2015-12-20 NOTE — Progress Notes (Signed)
PA 38/28 (27)

## 2015-12-20 NOTE — Progress Notes (Signed)
Patient arrived the unit from 58M , assessment completed see flowsheet, placed on tele ccmd  notified, patient oriented to room and staff, bed in lowest position call light within reach will contniue to monitor.

## 2015-12-20 NOTE — Progress Notes (Signed)
  Echocardiogram 2D Echocardiogram has been performed.  [name] 12/20/2015, 4:16 PM

## 2015-12-20 NOTE — Progress Notes (Addendum)
PULMONARY / CRITICAL CARE MEDICINE   Name: [name] MRN: [id number] DOB: January 13, 1951    ADMISSION DATE:  12/19/2015 CONSULTATION DATE:  12/19/2015  REFERRING MD:  EDP Dr. Blinda Leatherwood  CHIEF COMPLAINT:  SOB  HISTORY OF PRESENT ILLNESS:   65 year old female with PMH as below, which is significant for Barrett's esophagus, arthritis, anemia, and chronic back pain. She has a history of spinal surgery several years ago, which she just recently had revised 10/31/2015 at [facility]. She underwent revision of posterior spinal fusion on the lumbar facets and on the transverse processes at L4-5, Posterior spinal instrumentation at L4, L5, and S1 with 1 pedicle screw at each level bilaterally, Removal of instrumentation at L5 and S1, and Bilateral hemilaminectomy with decompression of the thecal sac, L2, L3, L4 and L5 nerve roots. She reports not having received "shots in her belly" during that admission like she has in the past. She was discharged to home 2 days later.  Post-discharge course was initially uncomplicated, but during the week of 8/27 she began to develop shortness of breath and experienced several episodes of near syncope. 8/30 early AM dyspnea became too strong and she presented to the emergency department with O2 sats in the 60s and profound work of breathing. She was started on BiPAP with 100% FiO2 and O2 sats  Improved into the 90s. CXR consistent with focal opacification concerning for pulmonary infarct. CT angio demonstrated large saddle PE with RV/LV ratio suggesting significant RV strain. She remained hemodynamically stable. She was started on heparin and PCCM asked to eval.    SUBJECTIVE:  BP wnl, no bipap needed, no distress  VITAL SIGNS: BP 118/73   Pulse 78   Temp 98.2 F (36.8 C) (Oral)   Resp 19   Ht 5\' 5"  (1.651 m)   Wt (!) 268.4 kg (591 lb 11.4 oz)   SpO2 95%   BMI 98.47 kg/m   HEMODYNAMICS:    VENTILATOR SETTINGS: FiO2 (%):  [50 %] 50 %  INTAKE /  OUTPUT: I/O last 3 completed shifts: In: 3614.7 [P.O.:240; I.V.:3224.7; IV Piggyback:150] Out: 3050 [Urine:3050]  PHYSICAL EXAMINATION: General:  Morbidly obese female in bed Neuro:  Alert, oriented, non-focal HEENT:  jvd wnl Cardiovascular: s1 s2 RRR not tachy Lungs:  Clear Abdomen:  Soft, non-tender, non-distended, obese Musculoskeletal:  No acute deformity or ROM limitation Skin:  Grossly intact, cool feet  LABS:  BMET  Recent Labs Lab 12/19/15 0142 12/19/15 0150 12/20/15 0348  NA 137 140 140  K 3.8 3.9 3.7  CL 103 104 112*  CO2 23  --  23  BUN 19 23* 12  CREATININE 1.01* 1.00 0.79  GLUCOSE 215* 211* 122*    Electrolytes  Recent Labs Lab 12/19/15 0142 12/20/15 0348  CALCIUM 9.4 8.1*  MG  --  2.0  PHOS  --  3.3    CBC  Recent Labs Lab 12/19/15 1450 12/19/15 2050 12/20/15 0348  WBC 11.3* 12.6* 10.9*  HGB 11.5* 11.7* 10.6*  HCT 35.8* 36.4 33.1*  PLT 158 135* 125*    Coag's  Recent Labs Lab 12/19/15 0518 12/20/15 0348  APTT  --  181*  INR 1.04 1.29    Sepsis Markers  Recent Labs Lab 12/19/15 1115  LATICACIDVEN 2.0*    ABG No results for input(s): PHART, PCO2ART, PO2ART in the last 168 hours.  Liver Enzymes  Recent Labs Lab 12/19/15 0142  AST 25  ALT 16  ALKPHOS 83  BILITOT 1.3*  ALBUMIN 3.5    Cardiac Enzymes  Recent Labs Lab 12/19/15 1115 12/19/15 1743 12/20/15 0348  TROPONINI 1.50* 1.53* 0.98*    Glucose  Recent Labs Lab 12/19/15 1104 12/19/15 1547 12/19/15 2045 12/19/15 2356 12/20/15 0342 12/20/15 0820  GLUCAP 230* 131* 164* 129* 114* 123*    Imaging Dg Chest Port 1 View  Result Date: 12/20/2015 CLINICAL DATA:  Hypoxia. EXAM: PORTABLE CHEST 1 VIEW COMPARISON:  12/19/2015.  CT 12/19/2015. FINDINGS: Pulmonary artery infusion catheters noted both pulmonary arteries. Mild cardiomegaly. No pulmonary venous congestion. Improvement of right perihilar infiltrate. Mild right upper lobe and right base  atelectasis and or infiltrate. No pleural effusion or pneumothorax. IMPRESSION: 1. Pulmonary artery fusion catheters noted in both pulmonary arteries. 2. Improvement of right perihilar infiltrate. Mild right upper lobe and right lower lobe atelectasis and or infiltrates. 3. Cardiomegaly.  No pulmonary venous congestion. Electronically Signed   By: Maisie Fus  Register   On: 12/20/2015 06:57    STUDIES:  CTA chest 8/30 > Positive for acute PE with CT evidence of right heart strain (RV/LV Ratio = 2). Large area of consolidation. In the posterior aspect of the right upper lobe and right lower lobe superior segment, extending into the right posteromedial base. This could represent pulmonary infarction   CULTURES: 8/30 sputum>>>  ANTIBIOTICS: Ceftriaxone 8/30>>>  SIGNIFICANT EVENTS: 7/12 spinal surgery at [facility] 8/30 admit for massive PE, ekos  LINES/TUBES: PIV Rt fem for EKOS 8/30>>  DISCUSSION: 65 year old female with recent spinal surgery presenting with acute PE. Submassive/massive on exam. Started on heparin,  EKOS per IR 8/30 at 0800. High O2 demands and high risk of decompensation. Tenuous status. ICU for close monitoring.    ASSESSMENT / PLAN:  PULMONARY A: Acute hypoxemic respiratory failure secondary to pulmonary embolism Saddle PE Concern PNA rt base, a component infarct likely   P:   bipap not needed, dc O2 reduce to sats 94% pcxr improved abx required  CARDIOVASCULAR A:  No evidence obstructive shock R/o underlying pa htn P:  Telemetry monitoring  Gentle IV fluid Trop no further needed Echo to assess pa pressures See heme To IR to dc ekos likely   RENAL Lab Results  Component Value Date   CREATININE 0.79 12/20/2015   CREATININE 1.00 12/19/2015   CREATININE 1.01 (H) 12/19/2015    A:   Acute renal risk  P:   kvo  GASTROINTESTINAL A:   Barrett's esophagus  P:   advance diet after IR trip Pepcid IV BID for SUP - dc  HEMATOLOGIC A:    Massive/submassive saddle pulmonary embolism - provoked in setting of recent surgery Family history present for clots, sister at 71 P:  Heparin infusion EKOS, likely to dc Echocardiogram pending, r/o mobile clot Doppler legs pending Follow hypercoagulable workup By am, consider transition to xarelto   INFECTIOUS A:   Leukocytosis, PAN rt base doubt all infarct accounts for all this  P:   Ceftriaxone, add stop date  When able to give oral and clinically improved, use augmentin  ENDOCRINE A:   Hyperglycemia without history of DM  P:   CBG monitoring and SSI q 4 hours  NEUROLOGIC A:   No acute issues  P:   Monitor pain   FAMILY  - Updates: patient and husband updated by PH/JN in ED 8/30, DF 8/30 and 8/31  - Inter-disciplinary family meet or Palliative Care meeting due by:  9/6  To triad, tele once ekos off Will stay as pulm  Mcarthur Rossetti. Tyson Alias, MD, FACP Pgr: [phone] [facility] 12/20/2015 9:19 AM

## 2015-12-20 NOTE — Progress Notes (Signed)
ANTICOAGULATION CONSULT NOTE - [place] for Heparin Indication:Saddle pulmonary embolus  Allergies  Allergen Reactions  . Ciprofloxacin Anaphylaxis  . Strawberry Extract Anaphylaxis    Patient Measurements: Height: 5\' 5"  (165.1 cm) Weight: (!) 591 lb 11.4 oz (268.4 kg) IBW/kg (Calculated) : 57 Heparin Dosing Weight: 80 kg  Vital Signs: Temp: 98.2 F (36.8 C) (08/31 0823) Temp Source: Oral (08/31 0823) BP: 118/73 (08/31 0900) Pulse Rate: 78 (08/31 0900)  Labs:  Recent Labs  12/19/15 0142 12/19/15 0150 12/19/15 0518  12/19/15 1115 12/19/15 1315 12/19/15 1450 12/19/15 1743 12/19/15 2050 12/19/15 2230 12/20/15 0348 12/20/15 0800  HGB 14.0 15.0  --   --   --   --  11.5*  --  11.7*  --  10.6*  --   HCT 44.2 44.0  --   --   --   --  35.8*  --  36.4  --  33.1*  --   PLT 202  --   --   --   --   --  158  --  135*  --  125*  --   APTT  --   --   --   --   --   --   --   --   --   --  181*  --   LABPROT  --   --  13.6  --   --   --   --   --   --   --  16.2*  --   INR  --   --  1.04  --   --   --   --   --   --   --  1.29  --   HEPARINUNFRC  --   --   --   < > >2.20* >2.20*  --   --   --  >2.20*  --  0.80*  CREATININE 1.01* 1.00  --   --   --   --   --   --   --   --  0.79  --   TROPONINI  --   --   --   --  1.50*  --   --  1.53*  --   --  0.98*  --   < > = values in this interval not displayed.  Estimated Creatinine Clearance: 156.7 mL/min (by C-G formula based on SCr of 0.8 mg/dL).   Medical History: Past Medical History:  Diagnosis Date  . Anemia   . Barrett's esophagus   . Murmur   . OA (osteoarthritis)   . Obese   . Plantar fasciitis     Assessment: 66 y.o. F with a large volume massive pulmonary embolus. There is a large central saddle at the bifurcation of the main pulmonary artery and multiple secondary saddles at subsequent bifurcations. There is also CT evidence of right heart strain.   HL this AM 0.80- supratherapeutic on  1000 units/hr. We are still unsure if the levels are being drawn correctly.   Pt was receiving EKOS and is currently getting her catheters pulled in Interventional Radiology. After this, we will be able to get more accurate blood draws due to the EKOS being stopped. Because of this we will not make any changes at this time and re-check a level in 6 hours.   Goal of Therapy:  Heparin level 0.3-0.7 units/mL Monitor platelets by anticoagulation protocol: Yes   Plan:  Continue heparin at 1000 units/hr 1400  HL Daily heparin level and CBC Plan to transition to [place] 9/1 AM.   Uvaldo Bristle, Pharm D Pharmacy Resident  Pager: [phone] 12/20/2015 10:01 AM

## 2015-12-20 NOTE — Progress Notes (Signed)
Escorted with transport on monitor to 2M15 via bed report off to [place] groin site c/d/i

## 2015-12-20 NOTE — Progress Notes (Signed)
Preliminary results by tech - Venous Duplex Lower Ext. Completed. Right leg, partial deep vein thrombosis involving the peroneal veins. All other veins are patent without evidence of thrombus. Left leg, acute superficial vein thrombosis involving the saphenofemoral junction -  at the common femoral vein. No evidence of deep vein thrombosis in the left leg. Results given to patient's nurse, Carmell Austria. Oda Cogan, BS, RDMS, RVT

## 2015-12-20 NOTE — Progress Notes (Signed)
ANTICOAGULATION CONSULT NOTE - [place] for Heparin Indication:Saddle pulmonary embolus  Allergies  Allergen Reactions  . Ciprofloxacin Anaphylaxis  . Strawberry Extract Anaphylaxis    Patient Measurements: Height: 5\' 5"  (165.1 cm) Weight: 259 lb 14.8 oz (117.9 kg) IBW/kg (Calculated) : 57 Heparin Dosing Weight: 80 kg  Vital Signs: Temp: 98.1 F (36.7 C) (08/30 2353) Temp Source: Oral (08/30 2353) BP: 105/67 (08/30 2300) Pulse Rate: 78 (08/30 2300)  Labs:  Recent Labs  12/19/15 0142 12/19/15 0150 12/19/15 0518 12/19/15 1115 12/19/15 1315 12/19/15 1450 12/19/15 1743 12/19/15 2050 12/19/15 2230  HGB 14.0 15.0  --   --   --  11.5*  --  11.7*  --   HCT 44.2 44.0  --   --   --  35.8*  --  36.4  --   PLT 202  --   --   --   --  158  --  135*  --   LABPROT  --   --  13.6  --   --   --   --   --   --   INR  --   --  1.04  --   --   --   --   --   --   HEPARINUNFRC  --   --   --  >2.20* >2.20*  --   --   --  >2.20*  CREATININE 1.01* 1.00  --   --   --   --   --   --   --   TROPONINI  --   --   --  1.50*  --   --  1.53*  --   --     Estimated Creatinine Clearance: 72.1 mL/min (by C-G formula based on SCr of 1 mg/dL).   Medical History: Past Medical History:  Diagnosis Date  . Anemia   . Barrett's esophagus   . Murmur   . OA (osteoarthritis)   . Obese   . Plantar fasciitis     Assessment: 65 y.o. F with a large volume massive pulmonary embolus. There is a large central saddle at the bifurcation of the main pulmonary artery and multiple secondary saddles at subsequent bifurcations. There is also CT evidence of right heart strain.   HL continues to be >2.2- Nurse took proper precautions since she had to draw out of the line where the heparin was infusing due to EKOS. The heparin was stopped for 5 minutes and 10 mLs of blood were wasted prior to all high levels  Pt is currently on the EKOS protocol with alteplase infusing  Goal of  Therapy:  Heparin level 0.3-0.7 units/mL Monitor platelets by anticoagulation protocol: Yes   Plan:  Will hold heparin drip for 1 hour  Restart  heparin drip at 1000 units/hr at 0130 0900 HL Daily heparin level and CBC  Narda Bonds, PharmD Clinical Pharmacist Phone: [phone] 12/20/2015 12:18 AM

## 2015-12-20 NOTE — Progress Notes (Signed)
Escorted on monitor to IR1 with RN and rad techs for PA pressures

## 2015-12-20 NOTE — Procedures (Signed)
Interventional Radiology Procedure Note  Procedure:  Re-check of PA pressure, SP CDT of PE.    Findings:  38/28 (27), decreased.   Patient reports feeling much better.   Recommendations:  - Ok to shower tomorrow - Do not submerge for 7 days - Routine wound care - Agree with anti-coagulation    Signed,  Dulcy Fanny. Earleen Newport, DO

## 2015-12-20 NOTE — Progress Notes (Signed)
Patient transferred to 2W36 with all belongings including clothing and green bag. Receiving nurse at bedside.

## 2015-12-20 NOTE — Progress Notes (Signed)
ANTICOAGULATION CONSULT NOTE - [place] for Heparin Indication:Saddle pulmonary embolus  Allergies  Allergen Reactions  . Ciprofloxacin Anaphylaxis  . Strawberry Extract Anaphylaxis    Patient Measurements: Height: 5\' 5"  (165.1 cm) Weight: 268 lb 1.3 oz (121.6 kg) IBW/kg (Calculated) : 57 Heparin Dosing Weight: 80 kg  Vital Signs: Temp: 98.2 F (36.8 C) (08/31 1454) Temp Source: Oral (08/31 1454) BP: 113/59 (08/31 1454) Pulse Rate: 83 (08/31 1454)  Labs:  Recent Labs  12/19/15 0142 12/19/15 0150 12/19/15 0518 12/19/15 1115  12/19/15 1450 12/19/15 1743 12/19/15 2050 12/19/15 2230 12/20/15 0348 12/20/15 0800 12/20/15 1647  HGB 14.0 15.0  --   --   --  11.5*  --  11.7*  --  10.6*  --   --   HCT 44.2 44.0  --   --   --  35.8*  --  36.4  --  33.1*  --   --   PLT 202  --   --   --   --  158  --  135*  --  125*  --   --   APTT  --   --   --   --   --   --   --   --   --  181*  --   --   LABPROT  --   --  13.6  --   --   --   --   --   --  16.2*  --   --   INR  --   --  1.04  --   --   --   --   --   --  1.29  --   --   HEPARINUNFRC  --   --   --  >2.20*  < >  --   --   --  >2.20*  --  0.80* 0.14*  CREATININE 1.01* 1.00  --   --   --   --   --   --   --  0.79  --   --   TROPONINI  --   --   --  1.50*  --   --  1.53*  --   --  0.98*  --   --   < > = values in this interval not displayed.  Estimated Creatinine Clearance: 91.6 mL/min (by C-G formula based on SCr of 0.8 mg/dL).   Medical History: Past Medical History:  Diagnosis Date  . Anemia   . Barrett's esophagus   . Murmur   . OA (osteoarthritis)   . Obese   . Plantar fasciitis     Assessment: 65 y.o. F with a large volume massive pulmonary embolus. There is a large central saddle at the bifurcation of the main pulmonary artery and multiple secondary saddles at subsequent bifurcations. There is also CT evidence of right heart strain.   Pt is s/p EKOS, catheters removed in IR  later today. Heparin level subtherapeutic 0.14 on 1000 units/hr.  Goal of Therapy:  Heparin level 0.3-0.7 units/mL Monitor platelets by anticoagulation protocol: Yes   Plan:  Increase heparin rate to 1250 units/hr F/u AM heparin level F/u plan for transition to Xarelto 9/1 AM.   Maryanna Shape, PharmD, BCPS  Clinical Pharmacist  Pager: [phone]   12/20/2015 6:06 PM

## 2015-12-21 ENCOUNTER — Encounter (HOSPITAL_COMMUNITY): Payer: Self-pay | Admitting: General Practice

## 2015-12-21 DIAGNOSIS — R778 Other specified abnormalities of plasma proteins: Secondary | ICD-10-CM | POA: Diagnosis present

## 2015-12-21 DIAGNOSIS — J189 Pneumonia, unspecified organism: Secondary | ICD-10-CM

## 2015-12-21 DIAGNOSIS — R0902 Hypoxemia: Secondary | ICD-10-CM

## 2015-12-21 DIAGNOSIS — I2602 Saddle embolus of pulmonary artery with acute cor pulmonale: Principal | ICD-10-CM

## 2015-12-21 DIAGNOSIS — Z9889 Other specified postprocedural states: Secondary | ICD-10-CM

## 2015-12-21 DIAGNOSIS — R071 Chest pain on breathing: Secondary | ICD-10-CM

## 2015-12-21 DIAGNOSIS — I2699 Other pulmonary embolism without acute cor pulmonale: Secondary | ICD-10-CM

## 2015-12-21 DIAGNOSIS — R7989 Other specified abnormal findings of blood chemistry: Secondary | ICD-10-CM

## 2015-12-21 DIAGNOSIS — K2270 Barrett's esophagus without dysplasia: Secondary | ICD-10-CM

## 2015-12-21 HISTORY — DX: Other pulmonary embolism without acute cor pulmonale: I26.99

## 2015-12-21 LAB — CBC
HCT: 32.5 % — ABNORMAL LOW (ref 36.0–46.0)
Hemoglobin: 10.4 g/dL — ABNORMAL LOW (ref 12.0–15.0)
MCH: 26.8 pg (ref 26.0–34.0)
MCHC: 32 g/dL (ref 30.0–36.0)
MCV: 83.8 fL (ref 78.0–100.0)
Platelets: 115 10*3/uL — ABNORMAL LOW (ref 150–400)
RBC: 3.88 MIL/uL (ref 3.87–5.11)
RDW: 13.7 % (ref 11.5–15.5)
WBC: 8 10*3/uL (ref 4.0–10.5)

## 2015-12-21 LAB — GLUCOSE, CAPILLARY
Glucose-Capillary: 127 mg/dL — ABNORMAL HIGH (ref 65–99)
Glucose-Capillary: 129 mg/dL — ABNORMAL HIGH (ref 65–99)

## 2015-12-21 LAB — C4 COMPLEMENT: Complement C4, Body Fluid: 27 mg/dL (ref 14–44)

## 2015-12-21 LAB — HEPARIN LEVEL (UNFRACTIONATED)
Heparin Unfractionated: 0.24 IU/mL — ABNORMAL LOW (ref 0.30–0.70)
Heparin Unfractionated: 0.37 IU/mL (ref 0.30–0.70)

## 2015-12-21 LAB — C3 COMPLEMENT: C3 Complement: 139 mg/dL (ref 82–167)

## 2015-12-21 LAB — ANTINUCLEAR ANTIBODIES, IFA: ANA Ab, IFA: NEGATIVE

## 2015-12-21 LAB — COMPLEMENT, TOTAL: Compl, Total (CH50): >60 U/mL — ABNORMAL HIGH (ref 42–60)

## 2015-12-21 MED ORDER — AMOXICILLIN-POT CLAVULANATE 875-125 MG PO TABS
1.0000 | ORAL_TABLET | Freq: Two times a day (BID) | ORAL | Status: DC
  Administered 2015-12-21 – 2015-12-22 (×3): 1 via ORAL
  Filled 2015-12-21 (×3): qty 1

## 2015-12-21 MED ORDER — APIXABAN 5 MG PO TABS: 5.0000 mg | ORAL_TABLET | Freq: Two times a day (BID) | ORAL | Status: DC

## 2015-12-21 MED ORDER — APIXABAN 5 MG PO TABS
10.0000 mg | ORAL_TABLET | Freq: Two times a day (BID) | ORAL | Status: DC
  Administered 2015-12-21 – 2015-12-22 (×2): 10 mg via ORAL
  Filled 2015-12-21 (×2): qty 2

## 2015-12-21 NOTE — Evaluation (Signed)
Physical Therapy Evaluation/ Discharge Patient Details Name: [name] MRN: [id number] DOB: [birth year] Today's Date: 12/21/2015   History of Present Illness  65 yo admitted with saddle pulmonary embolus. PMHx: PLIF in [place] 10/31/15, arthritis, anemia  Clinical Impression  [name] is very pleasant and able to state 2/3 back precautions with education for all as well as cues with transfers and gait to maintain. Brace not available today and pt states she has been moving some without it. Pt currently at baseline functional status with back education received post op. Pt with sats 92-95% on RA throughout gait and no further therapy needs at this time with pt aware and agreeable. Will sign off. Recommend family bring brace from home and pt ambulate daily.     Follow Up Recommendations No PT follow up    Equipment Recommendations  None recommended by PT    Recommendations for Other Services       Precautions / Restrictions Precautions Precautions: Back Required Braces or Orthoses: Spinal Brace Spinal Brace: Lumbar corset;Applied in sitting position      Mobility  Bed Mobility Overal bed mobility: Modified Independent Bed Mobility: Rolling;Sidelying to Sit Rolling: Modified independent (Device/Increase time) Sidelying to sit: Modified independent (Device/Increase time)       General bed mobility comments: pt utilizes grabbing mattress to roll and push up  Transfers Overall transfer level: Needs assistance   Transfers: Sit to/from Stand Sit to Stand: Supervision         General transfer comment: cues for upright posture with transitions  Ambulation/Gait Ambulation/Gait assistance: Supervision Ambulation Distance (Feet): 450 Feet Assistive device: Rolling walker (2 wheeled) Gait Pattern/deviations: Step-through pattern;Decreased stride length   Gait velocity interpretation: Below normal speed for age/gender General Gait Details: pt safely using RW and  ambulating with appropriate posture in hallways  Stairs            Wheelchair Mobility    Modified Rankin (Stroke Patients Only)       Balance                                             Pertinent Vitals/Pain Pain Assessment: No/denies pain    Home Living Family/patient expects to be discharged to:: Private residence Living Arrangements: Spouse/significant other Available Help at Discharge: Family;Available 24 hours/day Type of Home: House       Home Layout: One level Home Equipment: Walker - 2 wheels;Bedside commode      Prior Function Level of Independence: Independent with assistive device(s)         Comments: using RW since back surgery, typically 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     Hand Dominance        Extremity/Trunk Assessment   Upper Extremity Assessment: Overall WFL for tasks assessed           Lower Extremity Assessment: Overall WFL for tasks assessed      Cervical / Trunk Assessment: Lordotic  Communication   Communication: No difficulties  Cognition Arousal/Alertness: Awake/alert Behavior During Therapy: WFL for tasks assessed/performed Overall Cognitive Status: Within Functional Limits for tasks assessed                      General Comments      Exercises        Assessment/Plan    PT Assessment Patent does  not need any further PT services  PT Diagnosis Difficulty walking   PT Problem List    PT Treatment Interventions     PT Goals (Current goals can be found in the Care Plan section) Acute Rehab PT Goals PT Goal Formulation: All assessment and education complete, DC therapy    Frequency     Barriers to discharge        Co-evaluation               End of Session   Activity Tolerance: Patient tolerated treatment well Patient left: in chair;with call bell/phone within reach;with family/visitor present           Time: 1350-1413 PT Time  Calculation (min) (ACUTE ONLY): 23 min   Charges:   PT Evaluation $PT Eval Moderate Complexity: 1 Procedure     PT G CodesDelorse [name] 12/21/2015, 2:25 PM  [name], PT [phone]

## 2015-12-21 NOTE — Progress Notes (Signed)
PULMONARY / CRITICAL CARE MEDICINE   Name: [name] MRN: [id number] DOB: 1950/09/02    ADMISSION DATE:  12/19/2015 CONSULTATION DATE:  12/19/2015  REFERRING MD:  EDP Dr. Blinda Leatherwood  CHIEF COMPLAINT:  SOB  HISTORY OF PRESENT ILLNESS:   65 year old female with PMH as below, which is significant for Barrett's esophagus, arthritis, anemia, and chronic back pain. She has a history of spinal surgery several years ago, which she just recently had revised 10/31/2015 at [facility]. She underwent revision of posterior spinal fusion on the lumbar facets and on the transverse processes at L4-5, Posterior spinal instrumentation at L4, L5, and S1 with 1 pedicle screw at each level bilaterally, Removal of instrumentation at L5 and S1, and Bilateral hemilaminectomy with decompression of the thecal sac, L2, L3, L4 and L5 nerve roots. She reports not having received "shots in her belly" during that admission like she has in the past. She was discharged to home 2 days later.  Post-discharge course was initially uncomplicated, but during the week of 8/27 she began to develop shortness of breath and experienced several episodes of near syncope. 8/30 early AM dyspnea became too strong and she presented to the emergency department with O2 sats in the 60s and profound work of breathing. She was started on BiPAP with 100% FiO2 and O2 sats  Improved into the 90s. CXR consistent with focal opacification concerning for pulmonary infarct. CT angio demonstrated large saddle PE with RV/LV ratio suggesting significant RV strain. She remained hemodynamically stable. She was started on heparin and PCCM asked to eval.    SUBJECTIVE:  Pt reports feeling poorly - remains SOB, feels like she is "breathing through a blanket".  Difficult to walk to the bathroom.  Reports minimal hemoptysis with cough.  No other bleeding.    VITAL SIGNS: BP 113/67 (BP Location: Left Arm)   Pulse 70   Temp 98.2 F (36.8 C) (Oral)    Resp 19   Ht 5\' 5"  (1.651 m)   Wt 265 lb (120.2 kg)   SpO2 98%   BMI 44.10 kg/m   HEMODYNAMICS: PAP: (38)/(28) 38/28  VENTILATOR SETTINGS:    INTAKE / OUTPUT: I/O last 3 completed shifts: In: 2345.8 [P.O.:350; I.V.:1945.8; IV Piggyback:50] Out: 1420 [Urine:1420]  PHYSICAL EXAMINATION: General:  Morbidly obese female lying in bed Neuro: AAOx4, speech clear, MAE  HEENT:  jvd wnl Cardiovascular: s1s2 RRR not tachy Lungs:  Even/non-labored, lungs bilaterally clear Abdomen:  Soft, non-tender, non-distended, obese Musculoskeletal:  No acute deformity or ROM limitation Skin:  Warm/dry, no rashes or lesions  LABS:  BMET  Recent Labs Lab 12/19/15 0142 12/19/15 0150 12/20/15 0348  NA 137 140 140  K 3.8 3.9 3.7  CL 103 104 112*  CO2 23  --  23  BUN 19 23* 12  CREATININE 1.01* 1.00 0.79  GLUCOSE 215* 211* 122*    Electrolytes  Recent Labs Lab 12/19/15 0142 12/20/15 0348  CALCIUM 9.4 8.1*  MG  --  2.0  PHOS  --  3.3    CBC  Recent Labs Lab 12/19/15 2050 12/20/15 0348 12/21/15 0243  WBC 12.6* 10.9* 8.0  HGB 11.7* 10.6* 10.4*  HCT 36.4 33.1* 32.5*  PLT 135* 125* 115*    Coag's  Recent Labs Lab 12/19/15 0518 12/20/15 0348  APTT  --  181*  INR 1.04 1.29    Sepsis Markers  Recent Labs Lab 12/19/15 1115  LATICACIDVEN 2.0*    ABG No results for input(s): PHART,  PCO2ART, PO2ART in the last 168 hours.  Liver Enzymes  Recent Labs Lab 12/19/15 0142  AST 25  ALT 16  ALKPHOS 83  BILITOT 1.3*  ALBUMIN 3.5    Cardiac Enzymes  Recent Labs Lab 12/19/15 1115 12/19/15 1743 12/20/15 0348  TROPONINI 1.50* 1.53* 0.98*    Glucose  Recent Labs Lab 12/20/15 0820 12/20/15 1118 12/20/15 1611 12/20/15 2101 12/21/15 0006 12/21/15 0447  GLUCAP 123* 129* 138* 123* 129* 127*    Imaging Ir Rande Lawman F/u Rhys Martini Art/ven Final Day (ms)  Result Date: 12/20/2015 INDICATION: 65 year old female with history of catheter directed thrombolysis for  PE EXAM: IR THROMB F/U EVAL ART/VEN FINAL DAY COMPARISON:  12/19/2015 MEDICATIONS: None. ANESTHESIA/SEDATION: None FLUOROSCOPY TIME:  Fluoroscopy Time: 0 minutes 12 seconds COMPLICATIONS: None TECHNIQUE: Informed written consent was obtained from the patient after a thorough discussion of the procedural risks, benefits and alternatives. All questions were addressed. Maximal Sterile Barrier Technique was utilized including caps, mask, sterile gowns, sterile gloves, sterile drape, hand hygiene and skin antiseptic. A timeout was performed prior to the initiation of the procedure. Patient positioned supine position on fluoroscopy table. Scout image was acquired of the chest. Under fluoroscopy, the thrombolysis catheters were withdrawn to the main pulmonary artery. Repeat venous pressures were acquired. All catheters wires and sheaths were removed. Sterile dressing was placed at the right hip. FINDINGS: 38/28 (27), decreased from the initial pressure. IMPRESSION: Status post repeat pulmonary artery pressure in the main pulmonary artery, after full treatment session of bilateral pulmonary arteries with ultrasound accelerated catheter directed thrombolysis. Signed, Yvone Neu. Loreta Ave, DO [facility] Electronically Signed   By: Gilmer Mor D.O.   On: 12/20/2015 10:36    STUDIES:  CTA chest 8/30 > Positive for acute PE with CT evidence of right heart strain (RV/LV Ratio = 2). Large area of consolidation. In the posterior aspect of the right upper lobe and right lower lobe superior segment, extending into the right posteromedial base. This could represent pulmonary infarction  ECHO 8/31 >> LV normal size, mild concentric hypertrophy, LVEF 60-65%, normal wall motion, grade 1 diastolic dysfunction, RV moderately dilated, trivial TR, PA systolic pressure within normal range LE Duplex 8/31 >> acute DVT involving the R peroneal vein, acute superficial vein thrombosis  involving the left saphenofemoral junction & greater saphenous vein  CULTURES: 8/30 sputum >> canceled / not completed  ANTIBIOTICS: Ceftriaxone 8/30 >>  SIGNIFICANT EVENTS: 7/12 spinal surgery at [facility] 8/30 admit for massive PE, ekos  LINES/TUBES: PIV Rt fem for EKOS 8/30 >> 8/31  DISCUSSION: 65 year old female with recent spinal surgery presenting with acute PE. Submassive/massive on exam. Started on heparin,  EKOS per IR 8/30 at 0800. High O2 demands and high risk of decompensation. Tenuous status. ICU for close monitoring.    ASSESSMENT / PLAN:  PULMONARY A: Acute hypoxemic respiratory failure secondary to pulmonary embolism Saddle PE R Basilar Airspace Disease / Concern PNA - a component infarct likely  P:   Pulmonary hygiene  Wean O2 to off for sats >92% Recommend completion of 7 days abx for right basilar airspace disease.  Could transition to augmentin for d/c  Will need ambulatory assessment of O2 needs prior to d/c   CARDIOVASCULAR A:  No evidence obstructive shock No PAH on ECHO  P:  Telemetry monitoring  Gentle IV fluid Continue heparin gtt for now  GASTROINTESTINAL A:   Barrett's esophagus P:   Diet as tolerated   HEMATOLOGIC  A:   Massive/submassive saddle pulmonary embolism - provoked in setting of recent surgery, s/p EKOS Family history present for clots, sister at 5 P:  Heparin infusion per pharmacy  Appreciate IR assistance  Follow hypercoagulable workup Consider transition to Xarelto  Minimum 6 months to 1 year anticoagulation with family hx, if not life long (pending hypercoagulable panel)    FAMILY  - Updates: patient at bedside 9/1  - Inter-disciplinary family meet or Palliative Care meeting due by:  9/6  PCCM will be available PRN.  Please call if new needs arise.   Canary Brim, NP-C [facility] Pgr: [phone] or if no answer [phone] 12/21/2015, 9:26 AM  Attending Note:  65 year old female  with acute PE, s/p ekos.  Improved.  On exam, lungs are clear.  I reviewed CT myself, evidence of PE noted.  Discussed with PCCM-NP.  PE:  - Eliquis.  - D/C heparin drip.  Hypoxemia:  - Titrate O2 for sat of 88-92%.  - Ambulatory desat study prior to d/c for ?home O2.  RLL PNA:   - Continue Augmentin for total of 7 day of abx.  - F/U on cultures.  PCCM will sign off, please call back if needed.  Patient seen and examined, agree with above note.  I dictated the care and orders written for this patient under my direction.  Alyson Reedy, MD [phone]

## 2015-12-21 NOTE — Progress Notes (Signed)
[place] for Heparin Indication:Saddle pulmonary embolus  Allergies  Allergen Reactions  . Ciprofloxacin Anaphylaxis  . Strawberry Extract Anaphylaxis    Patient Measurements: Height: 5\' 5"  (165.1 cm) Weight: 265 lb (120.2 kg) IBW/kg (Calculated) : 57 Heparin Dosing Weight: 80 kg  Vital Signs: Temp: 98.2 F (36.8 C) (09/01 0449) Temp Source: Oral (09/01 0449) BP: 113/67 (09/01 0449) Pulse Rate: 70 (09/01 0449)  Labs:  Recent Labs  12/19/15 0142 12/19/15 0150 12/19/15 0518 12/19/15 1115  12/19/15 1743 12/19/15 2050  12/20/15 0348  12/20/15 1647 12/21/15 0243 12/21/15 1143  HGB 14.0 15.0  --   --   < >  --  11.7*  --  10.6*  --   --  10.4*  --   HCT 44.2 44.0  --   --   < >  --  36.4  --  33.1*  --   --  32.5*  --   PLT 202  --   --   --   < >  --  135*  --  125*  --   --  115*  --   APTT  --   --   --   --   --   --   --   --  181*  --   --   --   --   LABPROT  --   --  13.6  --   --   --   --   --  16.2*  --   --   --   --   INR  --   --  1.04  --   --   --   --   --  1.29  --   --   --   --   HEPARINUNFRC  --   --   --  >2.20*  < >  --   --   < >  --   < > 0.14* 0.24* 0.37  CREATININE 1.01* 1.00  --   --   --   --   --   --  0.79  --   --   --   --   TROPONINI  --   --   --  1.50*  --  1.53*  --   --  0.98*  --   --   --   --   < > = values in this interval not displayed.  Estimated Creatinine Clearance: 91.1 mL/min (by C-G formula based on SCr of 0.8 mg/dL).  Assessment: 65 y.o. Female with PE  s/p EKOS Heparin level therapeutic  Goal of Therapy:  Heparin level 0.3-0.7 units/mL Monitor platelets by anticoagulation protocol: Yes   Plan:  Continue Heparin at 1500 units/hr Follow up AM labs  Thank you Anette Guarneri, PharmD [phone]  12/21/2015 12:37 PM

## 2015-12-21 NOTE — Progress Notes (Signed)
PROGRESS NOTE  [name]  EXB:[id number] DOB: 12/05/1950 DOA: 12/19/2015 PCP: Mickie Hillier, MD Outpatient Specialists:  Subjective: Slight shortness of breath, still on oxygen, has some cough with streaks of blood on it.  Brief Narrative:  65 year old female with PMH as below, which is significant for Barrett's esophagus, arthritis, anemia, and chronic back pain. She has a history of spinal surgery several years ago, which she just recently had revised 10/31/2015 at [facility]. She underwent revision of posterior spinal fusion on the lumbar facets and on the transverse processes at L4-5, Posterior spinal instrumentation at L4, L5, and S1 with 1 pedicle screw at each level bilaterally, Removal of instrumentation at L5 and S1, and Bilateral hemilaminectomy with decompression of the thecal sac, L2, L3, L4 and L5 nerve roots. She reports not having received "shots in her belly" during that admission like she has in the past. She was discharged to home 2 days later.  Assessment & Plan:   Principal Problem:   Acute massive pulmonary embolism (HCC) Active Problems:   Chest pain   H/O Spinal surgery   Elevated troponin   Barrett's esophagus   Acute massive pulmonary embolism -Presented with acute saddle pulmonary embolism, likely provoked, recent lumbar spinal surgery. -Status post catheter directed thrombolysis done on 8/30-8/31. -Has family history of VTE, sister with VTE at 34 -Currently on heparin, per PCCM consider switching to oral, will check for Xarelto versus Eliquis.  Elevated troponin -This is secondary to the acute massive PE with right heart strain. -2-D echo showed dilated right ventricle with decrease in systolic function.  Recent lumbar spinal surgery -Continue pain control and ask PT/OT to evaluate and treat  Barrett's esophagus -Continue PPI  DVT prophylaxis: On heparin drip Code Status: Full Code Family Communication:  Disposition Plan:    Diet: Diet regular Room service appropriate? Yes; Fluid consistency: Thin  Consultants:   IR, PCCM  Procedures:   Thrombolysis through EKOS.  Antimicrobials:   Ceftriaxone and azithromycin.   Objective: Vitals:   12/20/15 1454 12/20/15 1956 12/21/15 0449 12/21/15 1030  BP: (!) 113/59 136/81 113/67   Pulse: 83 79 70   Resp: 18 18 19    Temp: 98.2 F (36.8 C) 98 F (36.7 C) 98.2 F (36.8 C)   TempSrc: Oral Oral Oral   SpO2: 98% 100% 98% 95%  Weight:   120.2 kg (265 lb)   Height:        Intake/Output Summary (Last 24 hours) at 12/21/15 1100 Last data filed at 12/21/15 0257  Gross per 24 hour  Intake              410 ml  Output              675 ml  Net             -265 ml   Filed Weights   12/20/15 0500 12/20/15 1026 12/21/15 0449  Weight: (!) 268.4 kg (591 lb 11.4 oz) 121.6 kg (268 lb 1.3 oz) 120.2 kg (265 lb)    Examination: General exam: Appears calm and comfortable  Respiratory system: Clear to auscultation. Respiratory effort normal. Cardiovascular system: S1 & S2 heard, RRR. No JVD, murmurs, rubs, gallops or clicks. No pedal edema. Gastrointestinal system: Abdomen is nondistended, soft and nontender. No organomegaly or masses felt. Normal bowel sounds heard. Central nervous system: Alert and oriented. No focal neurological deficits. Extremities: Symmetric 5 x 5 power. Skin: No rashes, lesions or ulcers Psychiatry: Judgement and insight  appear normal. Mood & affect appropriate.   Data Reviewed: I have personally reviewed following labs and imaging studies  CBC:  Recent Labs Lab 12/19/15 0142 12/19/15 0150 12/19/15 1450 12/19/15 2050 12/20/15 0348 12/21/15 0243  WBC 13.4*  --  11.3* 12.6* 10.9* 8.0  HGB 14.0 15.0 11.5* 11.7* 10.6* 10.4*  HCT 44.2 44.0 35.8* 36.4 33.1* 32.5*  MCV 85.2  --  83.1 83.5 83.8 83.8  PLT 202  --  158 135* 125* 115*   Basic Metabolic Panel:  Recent Labs Lab 12/19/15 0142 12/19/15 0150 12/20/15 0348  NA 137 140 140   K 3.8 3.9 3.7  CL 103 104 112*  CO2 23  --  23  GLUCOSE 215* 211* 122*  BUN 19 23* 12  CREATININE 1.01* 1.00 0.79  CALCIUM 9.4  --  8.1*  MG  --   --  2.0  PHOS  --   --  3.3   GFR: Estimated Creatinine Clearance: 91.1 mL/min (by C-G formula based on SCr of 0.8 mg/dL). Liver Function Tests:  Recent Labs Lab 12/19/15 0142  AST 25  ALT 16  ALKPHOS 83  BILITOT 1.3*  PROT 7.3  ALBUMIN 3.5   No results for input(s): LIPASE, AMYLASE in the last 168 hours. No results for input(s): AMMONIA in the last 168 hours. Coagulation Profile:  Recent Labs Lab 12/19/15 0518 12/20/15 0348  INR 1.04 1.29   Cardiac Enzymes:  Recent Labs Lab 12/19/15 1115 12/19/15 1743 12/20/15 0348  TROPONINI 1.50* 1.53* 0.98*   BNP (last 3 results) No results for input(s): PROBNP in the last 8760 hours. HbA1C: No results for input(s): HGBA1C in the last 72 hours. CBG:  Recent Labs Lab 12/20/15 1118 12/20/15 1611 12/20/15 2101 12/21/15 0006 12/21/15 0447  GLUCAP 129* 138* 123* 129* 127*   Lipid Profile: No results for input(s): CHOL, HDL, LDLCALC, TRIG, CHOLHDL, LDLDIRECT in the last 72 hours. Thyroid Function Tests: No results for input(s): TSH, T4TOTAL, FREET4, T3FREE, THYROIDAB in the last 72 hours. Anemia Panel: No results for input(s): VITAMINB12, FOLATE, FERRITIN, TIBC, IRON, RETICCTPCT in the last 72 hours. Urine analysis:    Component Value Date/Time   COLORURINE YELLOW 01/14/2007 0910   APPEARANCEUR CLOUDY (A) 01/14/2007 0910   LABSPEC 1.014 01/14/2007 0910   PHURINE 6.5 01/14/2007 0910   GLUCOSEU NEGATIVE 01/14/2007 0910   HGBUR NEGATIVE 01/14/2007 0910   BILIRUBINUR NEGATIVE 01/14/2007 0910   KETONESUR NEGATIVE 01/14/2007 0910   PROTEINUR NEGATIVE 01/14/2007 0910   UROBILINOGEN 1.0 01/14/2007 0910   NITRITE NEGATIVE 01/14/2007 0910   LEUKOCYTESUR  01/14/2007 0910    NEGATIVE MICROSCOPIC NOT DONE ON URINES WITH NEGATIVE PROTEIN, BLOOD, LEUKOCYTES, NITRITE, OR  GLUCOSE <1000 mg/dL.   Sepsis Labs: @LABRCNTIP (procalcitonin:4,lacticidven:4)  ) Recent Results (from the past 240 hour(s))  MRSA PCR Screening     Status: None   Collection Time: 12/19/15  6:50 AM  Result Value Ref Range Status   MRSA by PCR NEGATIVE NEGATIVE Final    Comment:        The GeneXpert MRSA Assay (FDA approved for NASAL specimens only), is one component of a comprehensive MRSA colonization surveillance program. It is not intended to diagnose MRSA infection nor to guide or monitor treatment for MRSA infections.      Invalid input(s): PROCALCITONIN, LACTICACIDVEN   Radiology Studies: Dg Chest Port 1 View  Result Date: 12/20/2015 CLINICAL DATA:  Hypoxia. EXAM: PORTABLE CHEST 1 VIEW COMPARISON:  12/19/2015.  CT 12/19/2015. FINDINGS: Pulmonary artery infusion catheters  noted both pulmonary arteries. Mild cardiomegaly. No pulmonary venous congestion. Improvement of right perihilar infiltrate. Mild right upper lobe and right base atelectasis and or infiltrate. No pleural effusion or pneumothorax. IMPRESSION: 1. Pulmonary artery fusion catheters noted in both pulmonary arteries. 2. Improvement of right perihilar infiltrate. Mild right upper lobe and right lower lobe atelectasis and or infiltrates. 3. Cardiomegaly.  No pulmonary venous congestion. Electronically Signed   By: Maisie Fus  Register   On: 12/20/2015 06:57   Ir Rande Lawman F/u Eval Art/ven Final Day (ms)  Result Date: 12/20/2015 INDICATION: 65 year old female with history of catheter directed thrombolysis for PE EXAM: IR THROMB F/U EVAL ART/VEN FINAL DAY COMPARISON:  12/19/2015 MEDICATIONS: None. ANESTHESIA/SEDATION: None FLUOROSCOPY TIME:  Fluoroscopy Time: 0 minutes 12 seconds COMPLICATIONS: None TECHNIQUE: Informed written consent was obtained from the patient after a thorough discussion of the procedural risks, benefits and alternatives. All questions were addressed. Maximal Sterile Barrier Technique was utilized  including caps, mask, sterile gowns, sterile gloves, sterile drape, hand hygiene and skin antiseptic. A timeout was performed prior to the initiation of the procedure. Patient positioned supine position on fluoroscopy table. Scout image was acquired of the chest. Under fluoroscopy, the thrombolysis catheters were withdrawn to the main pulmonary artery. Repeat venous pressures were acquired. All catheters wires and sheaths were removed. Sterile dressing was placed at the right hip. FINDINGS: 38/28 (27), decreased from the initial pressure. IMPRESSION: Status post repeat pulmonary artery pressure in the main pulmonary artery, after full treatment session of bilateral pulmonary arteries with ultrasound accelerated catheter directed thrombolysis. Signed, Yvone Neu. Loreta Ave, DO [facility] Electronically Signed   By: Gilmer Mor D.O.   On: 12/20/2015 10:36        Scheduled Meds: . cefTRIAXone (ROCEPHIN)  IV  1 g Intravenous Q24H  . insulin aspart  0-15 Units Subcutaneous Q4H  . sodium chloride flush  3 mL Intravenous Q12H   Continuous Infusions: . sodium chloride    . sodium chloride    . sodium chloride 10 mL/hr at 12/20/15 0600  . sodium chloride 10 mL/hr at 12/20/15 0600  . heparin 1,500 Units/hr (12/21/15 0505)     LOS: 2 days    Time spent: 35 minutes    Shayona Hibbitts A, MD [facility] Pager [phone]  If 7PM-7AM, please contact night-coverage [email or web address] Password [email or web address] 12/21/2015, 11:00 AM

## 2015-12-21 NOTE — Progress Notes (Signed)
Insurance check completed for [employer] S[employer] @ [employer] # [id number]. XARELTO  20 MG DAILY ( 30 )  30 TAN  COVER- YES  CO-PAY- ZERO DOLLARS   Q/L  TIER- 2 DRUG  PRIOR APPROVAL- NO  PHARMACY :  [facility]   2. ELIQUIS   2.50 MG BID ( 30 )   AND  5 MG BID (30 )  COVER- YES                         YES  CO-PAY- ZERO DOLLARS     60 TAB    SAME  TIER- 2 DRUG                         SAME  PRIOR APPROVAL - NO                 NO  PHARMACY : [facility]

## 2015-12-21 NOTE — Progress Notes (Signed)
Referring Physician(s): Dr Baldwin Crown  Supervising Physician: Arne Cleveland  Patient Status:  Inpatient  Chief Complaint:  B PE with Rt heart strain  Subjective:  Ekos Lysis started 8/30 am 8/31: Interventional Radiology Procedure Note Procedure:  Re-check of PA pressure, SP CDT of PE.   Findings:  38/28 (27), decreased.  Patient reports feeling much better.  Pt better daily Sputum production is minimally blood tinged---only occasionally Some complaint of sob ambulating--but better 98% 3L  Allergies: Ciprofloxacin and Strawberry extract  Medications: Prior to Admission medications   Medication Sig Start Date End Date Taking? Authorizing Provider  HYDROcodone-acetaminophen (NORCO/VICODIN) 5-325 MG tablet Take 1 tablet by mouth every 6 (six) hours as needed for moderate pain.   Yes Historical Provider, MD     Vital Signs: BP 113/67 (BP Location: Left Arm)   Pulse 70   Temp 98.2 F (36.8 C) (Oral)   Resp 19   Ht 5\' 5"  (1.651 m)   Wt 265 lb (120.2 kg)   SpO2 98%   BMI 44.10 kg/m   Physical Exam  Cardiovascular: Normal rate.   Pulmonary/Chest: Effort normal and breath sounds normal. She has no wheezes.  Skin: Skin is warm and dry.  Rt groin site is NT No bleeding No hematoma Ecchymotic Rt foot 2+ pulses  Nursing note and vitals reviewed.   Imaging: Ct Angio Chest Pe W Or Wo Contrast  Result Date: 12/19/2015 CLINICAL DATA:  Dyspnea, sudden onset. EXAM: CT ANGIOGRAPHY CHEST WITH CONTRAST TECHNIQUE: Multidetector CT imaging of the chest was performed using the standard protocol during bolus administration of intravenous contrast. Multiplanar CT image reconstructions and MIPs were obtained to evaluate the vascular anatomy. CONTRAST:  100 mL Isovue 370 intravenous COMPARISON:  Radiograph 12/19/2015 FINDINGS: Cardiovascular: There is a very large volume pulmonary embolus. There is a large central saddle at the bifurcation of the main pulmonary artery  and multiple secondary saddles at subsequent bifurcations. There is CT evidence of right heart strain. Lungs: Dense consolidation in the right upper lobe posteriorly, right lower lobe posteriorly. This could represent infarction. Pneumonia or hemorrhage not excluded. Left lung is clear except for minor patchy opacities. Central airways: Patent Effusions: None Lymphadenopathy: None Esophagus: Unremarkable Upper abdomen: No significant abnormality Musculoskeletal: No significant skeletal lesion Review of the MIP images confirms the above findings. IMPRESSION: 1. Positive for acute PE with CT evidence of right heart strain (RV/LV Ratio = 2) consistent with at least submassive (intermediate risk) PE. The presence of right heart strain has been associated with an increased risk of morbidity and mortality. Please activate Code PE by paging [phone]. 2. Large area of consolidation in the posterior aspect of the right upper lobe and right lower lobe superior segment, extending into the right posteromedial base. This could represent pulmonary infarction. Pneumonia or hemorrhage not excluded. 3. These results were called by telephone at the time of interpretation on 12/19/2015 at 4:11 am to Dr. Joseph Berkshire , who verbally acknowledged these results. Electronically Signed   By: Andreas Newport M.D.   On: 12/19/2015 04:13   Ir Angiogram Pulmonary Bilateral Selective  Result Date: 12/19/2015 INDICATION: Acute saddle pulmonary embolus with acute cor pulmonale and respiratory distress EXAM: BILATERAL PULMONARY ARTERIOGRAPHY; ADDITIONAL ARTERIOGRAPHY; IR ULTRASOUND GUIDANCE VASC ACCESS RIGHT; IR INFUSION THROMBOL ARTERIAL INITIAL (MS) COMPARISON:  12/19/2015 CTA chest MEDICATIONS: 1% lidocaine ANESTHESIA/SEDATION: None.  Because of her respiratory status. The patient was continuously monitored during the procedure by the interventional radiology nurse under my direct supervision.  FLUOROSCOPY TIME:  Fluoroscopy Time:  7 minutes 36 seconds (69.7 mGy). COMPLICATIONS: None immediate. TECHNIQUE: Informed written consent was obtained from the patient after a thorough discussion of the procedural risks, benefits and alternatives. All questions were addressed. Maximal Sterile Barrier Technique was utilized including caps, mask, sterile gowns, sterile gloves, sterile drape, hand hygiene and skin antiseptic. A timeout was performed prior to the initiation of the procedure. Under sterile conditions and local anesthesia, ultrasound micropuncture access performed of the patent right common femoral vein. Images obtained for documentation. This was performed twice to insert adjacent 6 French sheaths into the common femoral vein. Vert catheters and Bentson guidewires were utilized to manipulate the access into the left and right pulmonary arteries. Pulmonary angiograms performed with hand injections. Right pulmonary artery: Filling defect within the right pulmonary artery visualized compatible with the known acute pulmonary embolus extending into the right lower lobe. Pressure measurements obtained. Right PA pressure: 40/24 (30) Left pulmonary artery: Limited left pulmonary angiogram demonstrates a central hypodense filling defect. Peripheral branches are patent into the lower lobe. Pressure measurements obtained. Left PA pressure: 36/26 (31) Over Rosen guidewires, EKOS infusion catheters with 12 cm infusion lengths were advanced into the pulmonary arteries bilaterally. Contrast injection reconfirms position of both catheters. Inner ultrasound infusion wires advanced into each infusion catheter. Final position confirmed with fluoroscopy. Images obtained for documentation. FINDINGS: Imaging confirms bilateral pulmonary infusion catheter insertion via a right femoral venous approach for PE lysis protocol. IMPRESSION: Successful ultrasound and fluoroscopic bilateral pulmonary arterial catheterizations, limited angiograms, pressure measurements,  and advancement of bilateral EKOS infusion catheters to begin PE thrombolysis protocol Electronically Signed   By: Jerilynn Mages.  Shick M.D.   On: 12/19/2015 10:36   Ir Angiogram Selective Each Additional Vessel  Result Date: 12/19/2015 INDICATION: Acute saddle pulmonary embolus with acute cor pulmonale and respiratory distress EXAM: BILATERAL PULMONARY ARTERIOGRAPHY; ADDITIONAL ARTERIOGRAPHY; IR ULTRASOUND GUIDANCE VASC ACCESS RIGHT; IR INFUSION THROMBOL ARTERIAL INITIAL (MS) COMPARISON:  12/19/2015 CTA chest MEDICATIONS: 1% lidocaine ANESTHESIA/SEDATION: None.  Because of her respiratory status. The patient was continuously monitored during the procedure by the interventional radiology nurse under my direct supervision. FLUOROSCOPY TIME:  Fluoroscopy Time: 7 minutes 36 seconds (69.7 mGy). COMPLICATIONS: None immediate. TECHNIQUE: Informed written consent was obtained from the patient after a thorough discussion of the procedural risks, benefits and alternatives. All questions were addressed. Maximal Sterile Barrier Technique was utilized including caps, mask, sterile gowns, sterile gloves, sterile drape, hand hygiene and skin antiseptic. A timeout was performed prior to the initiation of the procedure. Under sterile conditions and local anesthesia, ultrasound micropuncture access performed of the patent right common femoral vein. Images obtained for documentation. This was performed twice to insert adjacent 6 French sheaths into the common femoral vein. Vert catheters and Bentson guidewires were utilized to manipulate the access into the left and right pulmonary arteries. Pulmonary angiograms performed with hand injections. Right pulmonary artery: Filling defect within the right pulmonary artery visualized compatible with the known acute pulmonary embolus extending into the right lower lobe. Pressure measurements obtained. Right PA pressure: 40/24 (30) Left pulmonary artery: Limited left pulmonary angiogram demonstrates  a central hypodense filling defect. Peripheral branches are patent into the lower lobe. Pressure measurements obtained. Left PA pressure: 36/26 (31) Over Rosen guidewires, EKOS infusion catheters with 12 cm infusion lengths were advanced into the pulmonary arteries bilaterally. Contrast injection reconfirms position of both catheters. Inner ultrasound infusion wires advanced into each infusion catheter. Final position confirmed with fluoroscopy. Images obtained for  documentation. FINDINGS: Imaging confirms bilateral pulmonary infusion catheter insertion via a right femoral venous approach for PE lysis protocol. IMPRESSION: Successful ultrasound and fluoroscopic bilateral pulmonary arterial catheterizations, limited angiograms, pressure measurements, and advancement of bilateral EKOS infusion catheters to begin PE thrombolysis protocol Electronically Signed   By: Jerilynn Mages.  Shick M.D.   On: 12/19/2015 10:36   Ir Angiogram Selective Each Additional Vessel  Result Date: 12/19/2015 INDICATION: Acute saddle pulmonary embolus with acute cor pulmonale and respiratory distress EXAM: BILATERAL PULMONARY ARTERIOGRAPHY; ADDITIONAL ARTERIOGRAPHY; IR ULTRASOUND GUIDANCE VASC ACCESS RIGHT; IR INFUSION THROMBOL ARTERIAL INITIAL (MS) COMPARISON:  12/19/2015 CTA chest MEDICATIONS: 1% lidocaine ANESTHESIA/SEDATION: None.  Because of her respiratory status. The patient was continuously monitored during the procedure by the interventional radiology nurse under my direct supervision. FLUOROSCOPY TIME:  Fluoroscopy Time: 7 minutes 36 seconds (69.7 mGy). COMPLICATIONS: None immediate. TECHNIQUE: Informed written consent was obtained from the patient after a thorough discussion of the procedural risks, benefits and alternatives. All questions were addressed. Maximal Sterile Barrier Technique was utilized including caps, mask, sterile gowns, sterile gloves, sterile drape, hand hygiene and skin antiseptic. A timeout was performed prior to the  initiation of the procedure. Under sterile conditions and local anesthesia, ultrasound micropuncture access performed of the patent right common femoral vein. Images obtained for documentation. This was performed twice to insert adjacent 6 French sheaths into the common femoral vein. Vert catheters and Bentson guidewires were utilized to manipulate the access into the left and right pulmonary arteries. Pulmonary angiograms performed with hand injections. Right pulmonary artery: Filling defect within the right pulmonary artery visualized compatible with the known acute pulmonary embolus extending into the right lower lobe. Pressure measurements obtained. Right PA pressure: 40/24 (30) Left pulmonary artery: Limited left pulmonary angiogram demonstrates a central hypodense filling defect. Peripheral branches are patent into the lower lobe. Pressure measurements obtained. Left PA pressure: 36/26 (31) Over Rosen guidewires, EKOS infusion catheters with 12 cm infusion lengths were advanced into the pulmonary arteries bilaterally. Contrast injection reconfirms position of both catheters. Inner ultrasound infusion wires advanced into each infusion catheter. Final position confirmed with fluoroscopy. Images obtained for documentation. FINDINGS: Imaging confirms bilateral pulmonary infusion catheter insertion via a right femoral venous approach for PE lysis protocol. IMPRESSION: Successful ultrasound and fluoroscopic bilateral pulmonary arterial catheterizations, limited angiograms, pressure measurements, and advancement of bilateral EKOS infusion catheters to begin PE thrombolysis protocol Electronically Signed   By: Jerilynn Mages.  Shick M.D.   On: 12/19/2015 10:36   Ir US Guide Vasc Access Right  Result Date: 12/19/2015 INDICATION: Acute saddle pulmonary embolus with acute cor pulmonale and respiratory distress EXAM: BILATERAL PULMONARY ARTERIOGRAPHY; ADDITIONAL ARTERIOGRAPHY; IR ULTRASOUND GUIDANCE VASC ACCESS RIGHT; IR INFUSION  THROMBOL ARTERIAL INITIAL (MS) COMPARISON:  12/19/2015 CTA chest MEDICATIONS: 1% lidocaine ANESTHESIA/SEDATION: None.  Because of her respiratory status. The patient was continuously monitored during the procedure by the interventional radiology nurse under my direct supervision. FLUOROSCOPY TIME:  Fluoroscopy Time: 7 minutes 36 seconds (69.7 mGy). COMPLICATIONS: None immediate. TECHNIQUE: Informed written consent was obtained from the patient after a thorough discussion of the procedural risks, benefits and alternatives. All questions were addressed. Maximal Sterile Barrier Technique was utilized including caps, mask, sterile gowns, sterile gloves, sterile drape, hand hygiene and skin antiseptic. A timeout was performed prior to the initiation of the procedure. Under sterile conditions and local anesthesia, ultrasound micropuncture access performed of the patent right common femoral vein. Images obtained for documentation. This was performed twice to insert adjacent 6 [place] sheaths  into the common femoral vein. Vert catheters and Bentson guidewires were utilized to manipulate the access into the left and right pulmonary arteries. Pulmonary angiograms performed with hand injections. Right pulmonary artery: Filling defect within the right pulmonary artery visualized compatible with the known acute pulmonary embolus extending into the right lower lobe. Pressure measurements obtained. Right PA pressure: 40/24 (30) Left pulmonary artery: Limited left pulmonary angiogram demonstrates a central hypodense filling defect. Peripheral branches are patent into the lower lobe. Pressure measurements obtained. Left PA pressure: 36/26 (31) Over Rosen guidewires, EKOS infusion catheters with 12 cm infusion lengths were advanced into the pulmonary arteries bilaterally. Contrast injection reconfirms position of both catheters. Inner ultrasound infusion wires advanced into each infusion catheter. Final position confirmed with  fluoroscopy. Images obtained for documentation. FINDINGS: Imaging confirms bilateral pulmonary infusion catheter insertion via a right femoral venous approach for PE lysis protocol. IMPRESSION: Successful ultrasound and fluoroscopic bilateral pulmonary arterial catheterizations, limited angiograms, pressure measurements, and advancement of bilateral EKOS infusion catheters to begin PE thrombolysis protocol Electronically Signed   By: Jerilynn Mages.  Shick M.D.   On: 12/19/2015 10:36   Dg Chest Port 1 View  Result Date: 12/20/2015 CLINICAL DATA:  Hypoxia. EXAM: PORTABLE CHEST 1 VIEW COMPARISON:  12/19/2015.  CT 12/19/2015. FINDINGS: Pulmonary artery infusion catheters noted both pulmonary arteries. Mild cardiomegaly. No pulmonary venous congestion. Improvement of right perihilar infiltrate. Mild right upper lobe and right base atelectasis and or infiltrate. No pleural effusion or pneumothorax. IMPRESSION: 1. Pulmonary artery fusion catheters noted in both pulmonary arteries. 2. Improvement of right perihilar infiltrate. Mild right upper lobe and right lower lobe atelectasis and or infiltrates. 3. Cardiomegaly.  No pulmonary venous congestion. Electronically Signed   By: Marcello Moores  Register   On: 12/20/2015 06:57   Dg Chest Portable 1 View  Result Date: 12/19/2015 CLINICAL DATA:  Shortness of breath tonight. EXAM: PORTABLE CHEST 1 VIEW COMPARISON:  01/14/2007 FINDINGS: New right hilar opacity could represent focal consolidation or developing mass lesion. If patient's symptoms are consistent with pneumonia, would suggest follow-up examination in 4-6 weeks after appropriate therapy. Otherwise, CT is suggested for further evaluation. Left lung is clear and expanded. Normal heart size and pulmonary vascularity. No blunting of costophrenic angles. No pneumothorax. IMPRESSION: New masslike opacity in the right hilar region could represent pneumonia or mass lesion. See followup recommendations above. Electronically Signed   By:  Lucienne Capers M.D.   On: 12/19/2015 01:05   Ir Infusion Thrombol Arterial Initial (ms)  Result Date: 12/19/2015 INDICATION: Acute saddle pulmonary embolus with acute cor pulmonale and respiratory distress EXAM: BILATERAL PULMONARY ARTERIOGRAPHY; ADDITIONAL ARTERIOGRAPHY; IR ULTRASOUND GUIDANCE VASC ACCESS RIGHT; IR INFUSION THROMBOL ARTERIAL INITIAL (MS) COMPARISON:  12/19/2015 CTA chest MEDICATIONS: 1% lidocaine ANESTHESIA/SEDATION: None.  Because of her respiratory status. The patient was continuously monitored during the procedure by the interventional radiology nurse under my direct supervision. FLUOROSCOPY TIME:  Fluoroscopy Time: 7 minutes 36 seconds (69.7 mGy). COMPLICATIONS: None immediate. TECHNIQUE: Informed written consent was obtained from the patient after a thorough discussion of the procedural risks, benefits and alternatives. All questions were addressed. Maximal Sterile Barrier Technique was utilized including caps, mask, sterile gowns, sterile gloves, sterile drape, hand hygiene and skin antiseptic. A timeout was performed prior to the initiation of the procedure. Under sterile conditions and local anesthesia, ultrasound micropuncture access performed of the patent right common femoral vein. Images obtained for documentation. This was performed twice to insert adjacent 6 French sheaths into the common femoral vein. Vert catheters  and Bentson guidewires were utilized to manipulate the access into the left and right pulmonary arteries. Pulmonary angiograms performed with hand injections. Right pulmonary artery: Filling defect within the right pulmonary artery visualized compatible with the known acute pulmonary embolus extending into the right lower lobe. Pressure measurements obtained. Right PA pressure: 40/24 (30) Left pulmonary artery: Limited left pulmonary angiogram demonstrates a central hypodense filling defect. Peripheral branches are patent into the lower lobe. Pressure measurements  obtained. Left PA pressure: 36/26 (31) Over Rosen guidewires, EKOS infusion catheters with 12 cm infusion lengths were advanced into the pulmonary arteries bilaterally. Contrast injection reconfirms position of both catheters. Inner ultrasound infusion wires advanced into each infusion catheter. Final position confirmed with fluoroscopy. Images obtained for documentation. FINDINGS: Imaging confirms bilateral pulmonary infusion catheter insertion via a right femoral venous approach for PE lysis protocol. IMPRESSION: Successful ultrasound and fluoroscopic bilateral pulmonary arterial catheterizations, limited angiograms, pressure measurements, and advancement of bilateral EKOS infusion catheters to begin PE thrombolysis protocol Electronically Signed   By: Jerilynn Mages.  Shick M.D.   On: 12/19/2015 10:36   Ir Jacolyn Reedy F/u Elizabeth Sauer Art/ven Final Day (ms)  Result Date: 12/20/2015 INDICATION: 65 year old female with history of catheter directed thrombolysis for PE EXAM: IR THROMB F/U EVAL ART/VEN FINAL DAY COMPARISON:  12/19/2015 MEDICATIONS: None. ANESTHESIA/SEDATION: None FLUOROSCOPY TIME:  Fluoroscopy Time: 0 minutes 12 seconds COMPLICATIONS: None TECHNIQUE: Informed written consent was obtained from the patient after a thorough discussion of the procedural risks, benefits and alternatives. All questions were addressed. Maximal Sterile Barrier Technique was utilized including caps, mask, sterile gowns, sterile gloves, sterile drape, hand hygiene and skin antiseptic. A timeout was performed prior to the initiation of the procedure. Patient positioned supine position on fluoroscopy table. Scout image was acquired of the chest. Under fluoroscopy, the thrombolysis catheters were withdrawn to the main pulmonary artery. Repeat venous pressures were acquired. All catheters wires and sheaths were removed. Sterile dressing was placed at the right hip. FINDINGS: 38/28 (27), decreased from the initial pressure. IMPRESSION: Status post repeat  pulmonary artery pressure in the main pulmonary artery, after full treatment session of bilateral pulmonary arteries with ultrasound accelerated catheter directed thrombolysis. Signed, Dulcy Fanny. Earleen Newport, DO [facility] Electronically Signed   By: Corrie Mckusick D.O.   On: 12/20/2015 10:36    Labs:  CBC:  Recent Labs  12/19/15 1450 12/19/15 2050 12/20/15 0348 12/21/15 0243  WBC 11.3* 12.6* 10.9* 8.0  HGB 11.5* 11.7* 10.6* 10.4*  HCT 35.8* 36.4 33.1* 32.5*  PLT 158 135* 125* 115*    COAGS:  Recent Labs  12/19/15 0518 12/20/15 0348  INR 1.04 1.29  APTT  --  181*    BMP:  Recent Labs  09/24/15 0830 12/19/15 0142 12/19/15 0150 12/20/15 0348  NA 139 137 140 140  K 4.3 3.8 3.9 3.7  CL 99 103 104 112*  CO2  --  23  --  23  GLUCOSE 116* 215* 211* 122*  BUN 15 19 23* 12  CALCIUM 9.0 9.4  --  8.1*  CREATININE 0.79 1.01* 1.00 0.79  GFRNONAA 79 57*  --  >60  GFRAA 91 >60  --  >60    LIVER FUNCTION TESTS:  Recent Labs  09/24/15 0830 12/19/15 0142  BILITOT 0.8 1.3*  AST 16 25  ALT 8 16  ALKPHOS 65 83  PROT 6.6 7.3  ALBUMIN 3.7 3.5    Assessment and Plan:  B PE Ekos Thrombolysis 8/30-31 Better; breathing improved  Electronically Signed: Imanuel Pruiett A 12/21/2015, 9:46 AM   I spent a total of 15 Minutes at the the patient's bedside AND on the patient's hospital floor or unit, greater than 50% of which was counseling/coordinating care for P PE/lysis

## 2015-12-21 NOTE — Progress Notes (Signed)
[place] for Eliquis Indication:Saddle pulmonary embolus  Allergies  Allergen Reactions  . Ciprofloxacin Anaphylaxis  . Strawberry Extract Anaphylaxis    Patient Measurements: Height: 5\' 5"  (165.1 cm) Weight: 265 lb (120.2 kg) IBW/kg (Calculated) : 57 Heparin Dosing Weight: 80 kg  Vital Signs: Temp: 98 F (36.7 C) (09/01 1322) Temp Source: Oral (09/01 1322) BP: 139/64 (09/01 1322) Pulse Rate: 105 (09/01 1421)  Labs:  Recent Labs  12/19/15 0142 12/19/15 0150 12/19/15 0518 12/19/15 1115  12/19/15 1743 12/19/15 2050  12/20/15 0348  12/20/15 1647 12/21/15 0243 12/21/15 1143  HGB 14.0 15.0  --   --   < >  --  11.7*  --  10.6*  --   --  10.4*  --   HCT 44.2 44.0  --   --   < >  --  36.4  --  33.1*  --   --  32.5*  --   PLT 202  --   --   --   < >  --  135*  --  125*  --   --  115*  --   APTT  --   --   --   --   --   --   --   --  181*  --   --   --   --   LABPROT  --   --  13.6  --   --   --   --   --  16.2*  --   --   --   --   INR  --   --  1.04  --   --   --   --   --  1.29  --   --   --   --   HEPARINUNFRC  --   --   --  >2.20*  < >  --   --   < >  --   < > 0.14* 0.24* 0.37  CREATININE 1.01* 1.00  --   --   --   --   --   --  0.79  --   --   --   --   TROPONINI  --   --   --  1.50*  --  1.53*  --   --  0.98*  --   --   --   --   < > = values in this interval not displayed.  Estimated Creatinine Clearance: 91.1 mL/min (by C-G formula based on SCr of 0.8 mg/dL).  Assessment: 65 y.o. Female with PE  s/p EKOS, pharmacy is consulted to switching heparin to eliquis, scr 0.98, est. crcl ~ 90 ml/min. Liver function wnl.   Goal of Therapy:  Monitor platelets by anticoagulation protocol: Yes   Plan:  Eliquis 10 mg po bid x 7 days, first dose now Switch to 5mg  BID on 9/9  Thank you Maryanna Shape, PharmD, BCPS  Clinical Pharmacist  Pager: [phone]    12/21/2015 5:10 PM

## 2015-12-21 NOTE — Progress Notes (Signed)
[place] for Heparin Indication:Saddle pulmonary embolus  Allergies  Allergen Reactions  . Ciprofloxacin Anaphylaxis  . Strawberry Extract Anaphylaxis    Patient Measurements: Height: 5\' 5"  (165.1 cm) Weight: 268 lb 1.3 oz (121.6 kg) IBW/kg (Calculated) : 57 Heparin Dosing Weight: 80 kg  Vital Signs: Temp: 98 F (36.7 C) (08/31 1956) Temp Source: Oral (08/31 1956) BP: 136/81 (08/31 1956) Pulse Rate: 79 (08/31 1956)  Labs:  Recent Labs  12/19/15 0142 12/19/15 0150 12/19/15 0518 12/19/15 1115  12/19/15 1743 12/19/15 2050  12/20/15 0348 12/20/15 0800 12/20/15 1647 12/21/15 0243  HGB 14.0 15.0  --   --   < >  --  11.7*  --  10.6*  --   --  10.4*  HCT 44.2 44.0  --   --   < >  --  36.4  --  33.1*  --   --  32.5*  PLT 202  --   --   --   < >  --  135*  --  125*  --   --  PENDING  APTT  --   --   --   --   --   --   --   --  181*  --   --   --   LABPROT  --   --  13.6  --   --   --   --   --  16.2*  --   --   --   INR  --   --  1.04  --   --   --   --   --  1.29  --   --   --   HEPARINUNFRC  --   --   --  >2.20*  < >  --   --   < >  --  0.80* 0.14* 0.24*  CREATININE 1.01* 1.00  --   --   --   --   --   --  0.79  --   --   --   TROPONINI  --   --   --  1.50*  --  1.53*  --   --  0.98*  --   --   --   < > = values in this interval not displayed.  Estimated Creatinine Clearance: 91.6 mL/min (by C-G formula based on SCr of 0.8 mg/dL).  Assessment: 65 y.o. Female with PE  s/p EKOS, for heparin  Goal of Therapy:  Heparin level 0.3-0.7 units/mL Monitor platelets by anticoagulation protocol: Yes   Plan:  Increase Heparin 1500 units/hr Check heparin level in 8 hours.  Phillis Knack, PharmD, BCPS  12/21/2015 4:16 AM

## 2015-12-22 LAB — CBC
HCT: 34.6 % — ABNORMAL LOW (ref 36.0–46.0)
Hemoglobin: 10.9 g/dL — ABNORMAL LOW (ref 12.0–15.0)
MCH: 26.6 pg (ref 26.0–34.0)
MCHC: 31.5 g/dL (ref 30.0–36.0)
MCV: 84.4 fL (ref 78.0–100.0)
Platelets: 133 10*3/uL — ABNORMAL LOW (ref 150–400)
RBC: 4.1 MIL/uL (ref 3.87–5.11)
RDW: 13.6 % (ref 11.5–15.5)
WBC: 6.7 10*3/uL (ref 4.0–10.5)

## 2015-12-22 MED ORDER — PANTOPRAZOLE SODIUM 40 MG PO TBEC: 40.0000 mg | DELAYED_RELEASE_TABLET | Freq: Every day | ORAL | 0 refills | Status: DC

## 2015-12-22 MED ORDER — AMOXICILLIN-POT CLAVULANATE 875-125 MG PO TABS: 1.0000 | ORAL_TABLET | Freq: Two times a day (BID) | ORAL | 0 refills | Status: DC

## 2015-12-22 MED ORDER — APIXABAN 5 MG PO TABS: ORAL_TABLET | ORAL | 5 refills | Status: DC

## 2015-12-22 NOTE — Progress Notes (Signed)
Discharged to home with family office visits in place teaching done  

## 2015-12-22 NOTE — Discharge Summary (Signed)
Physician Discharge Summary  [name] NGE:[id number] DOB: [birth year] DOA: 12/19/2015  PCP: Mickie Hillier, MD  Admit date: 12/19/2015 Discharge date: 12/22/2015  Admitted From: Home Disposition: Home  Recommendations for Outpatient Follow-up:  1. Follow up with PCP in 1-2 weeks 2. Please obtain BMP/CBC in one week  Home Health: None Equipment/Devices: NA  Discharge Condition: Stable CODE STATUS: Full Diet recommendation: Heart Healthy   Brief/Interim Summary: 65 year old female with PMH as below, which is significant for Barrett's esophagus, arthritis, anemia, and chronic back pain. She has a history of spinal surgery several years ago, which she just recently had revised 10/31/2015 at [facility]. She underwent revision of posterior spinal fusion on the lumbar facets and on the transverse processes at L4-5, Posterior spinal instrumentation at L4, L5, and S1 with 1 pedicle screw at each level bilaterally,Removal of instrumentation at L5 and S1, and Bilateral hemilaminectomy with decompression of the thecal sac, L2, L3, L4 and L5 nerve roots. She reports not having received "shots in her belly" during that admission like she has in the past. She was discharged to home 2 days later.  Discharge Diagnoses:  Principal Problem:   Acute massive pulmonary embolism (HCC) Active Problems:   Chest pain   H/O Spinal surgery   Elevated troponin   Barrett's esophagus   Hypoxemia   CAP (community acquired pneumonia)   Acute massive pulmonary embolism -Presented with acute saddle pulmonary embolism, likely provoked, recent lumbar spinal surgery. -Status post catheter directed thrombolysis done on 8/30-8/31. -Has family history of VTE, sister with VTE at in her 51s -Needs a hypercoagulable state workup after finishing 6 months off Eliquis. -This was treated with IV heparin and catheter directed thrombolysis. -Discharged on Eliquis, CCM recommended at least 6 months of  treatment.  Community acquired pneumonia -CT angiography showed large area of consultation in the right upper and lower lobes. -Per pulmonology recommendation finish 7 days of antibiotics. -Discharged on Augmentin for 5 more days.  Acute hypoxic respiratory failure -Patient required 100% oxygen and BiPAP briefly, acute hypoxic respiratory failure likely secondary to massive PE. -Also CAP likely contributing, this is resolved. Currently on room air.  Elevated troponin -This is secondary to the acute massive PE with right heart strain. -2-D echo showed dilated right ventricle with decrease in systolic function.  Recent lumbar spinal surgery -Continue pain control and ask PT/OT to evaluate and treat  Barrett's esophagus -From EGD from September 2014, continued on Protonix.  Discharge Instructions  Discharge Instructions    Diet - low sodium heart healthy    Complete by:  As directed   Increase activity slowly    Complete by:  As directed       Medication List    TAKE these medications   amoxicillin-clavulanate 875-125 MG tablet Commonly known as:  AUGMENTIN Take 1 tablet by mouth every 12 (twelve) hours.   apixaban 5 MG Tabs tablet Commonly known as:  ELIQUIS Take 2 tablets (10 mg) by mouth BID for 1 week, then 1 tablet (5 mg) twice a day.   HYDROcodone-acetaminophen 5-325 MG tablet Commonly known as:  NORCO/VICODIN Take 1 tablet by mouth every 6 (six) hours as needed for moderate pain.   pantoprazole 40 MG tablet Commonly known as:  PROTONIX Take 1 tablet (40 mg total) by mouth daily.       Allergies  Allergen Reactions  . Ciprofloxacin Anaphylaxis  . Strawberry Extract Anaphylaxis    Consultations:   PCCM   Procedures/Studies: Ct  Angio Chest Pe W Or Wo Contrast  Result Date: 12/19/2015 CLINICAL DATA:  Dyspnea, sudden onset. EXAM: CT ANGIOGRAPHY CHEST WITH CONTRAST TECHNIQUE: Multidetector CT imaging of the chest was performed using the standard  protocol during bolus administration of intravenous contrast. Multiplanar CT image reconstructions and MIPs were obtained to evaluate the vascular anatomy. CONTRAST:  100 mL Isovue 370 intravenous COMPARISON:  Radiograph 12/19/2015 FINDINGS: Cardiovascular: There is a very large volume pulmonary embolus. There is a large central saddle at the bifurcation of the main pulmonary artery and multiple secondary saddles at subsequent bifurcations. There is CT evidence of right heart strain. Lungs: Dense consolidation in the right upper lobe posteriorly, right lower lobe posteriorly. This could represent infarction. Pneumonia or hemorrhage not excluded. Left lung is clear except for minor patchy opacities. Central airways: Patent Effusions: None Lymphadenopathy: None Esophagus: Unremarkable Upper abdomen: No significant abnormality Musculoskeletal: No significant skeletal lesion Review of the MIP images confirms the above findings. IMPRESSION: 1. Positive for acute PE with CT evidence of right heart strain (RV/LV Ratio = 2) consistent with at least submassive (intermediate risk) PE. The presence of right heart strain has been associated with an increased risk of morbidity and mortality. Please activate Code PE by paging [phone]. 2. Large area of consolidation in the posterior aspect of the right upper lobe and right lower lobe superior segment, extending into the right posteromedial base. This could represent pulmonary infarction. Pneumonia or hemorrhage not excluded. 3. These results were called by telephone at the time of interpretation on 12/19/2015 at 4:11 am to Dr. Jaci Carrel , who verbally acknowledged these results. Electronically Signed   By: Ellery Plunk M.D.   On: 12/19/2015 04:13   Ir Angiogram Pulmonary Bilateral Selective  Result Date: 12/19/2015 INDICATION: Acute saddle pulmonary embolus with acute cor pulmonale and respiratory distress EXAM: BILATERAL PULMONARY ARTERIOGRAPHY; ADDITIONAL  ARTERIOGRAPHY; IR ULTRASOUND GUIDANCE VASC ACCESS RIGHT; IR INFUSION THROMBOL ARTERIAL INITIAL (MS) COMPARISON:  12/19/2015 CTA chest MEDICATIONS: 1% lidocaine ANESTHESIA/SEDATION: None.  Because of her respiratory status. The patient was continuously monitored during the procedure by the interventional radiology nurse under my direct supervision. FLUOROSCOPY TIME:  Fluoroscopy Time: 7 minutes 36 seconds (69.7 mGy). COMPLICATIONS: None immediate. TECHNIQUE: Informed written consent was obtained from the patient after a thorough discussion of the procedural risks, benefits and alternatives. All questions were addressed. Maximal Sterile Barrier Technique was utilized including caps, mask, sterile gowns, sterile gloves, sterile drape, hand hygiene and skin antiseptic. A timeout was performed prior to the initiation of the procedure. Under sterile conditions and local anesthesia, ultrasound micropuncture access performed of the patent right common femoral vein. Images obtained for documentation. This was performed twice to insert adjacent 6 French sheaths into the common femoral vein. Vert catheters and Bentson guidewires were utilized to manipulate the access into the left and right pulmonary arteries. Pulmonary angiograms performed with hand injections. Right pulmonary artery: Filling defect within the right pulmonary artery visualized compatible with the known acute pulmonary embolus extending into the right lower lobe. Pressure measurements obtained. Right PA pressure: 40/24 (30) Left pulmonary artery: Limited left pulmonary angiogram demonstrates a central hypodense filling defect. Peripheral branches are patent into the lower lobe. Pressure measurements obtained. Left PA pressure: 36/26 (31) Over Rosen guidewires, EKOS infusion catheters with 12 cm infusion lengths were advanced into the pulmonary arteries bilaterally. Contrast injection reconfirms position of both catheters. Inner ultrasound infusion wires  advanced into each infusion catheter. Final position confirmed with fluoroscopy. Images obtained for documentation.  FINDINGS: Imaging confirms bilateral pulmonary infusion catheter insertion via a right femoral venous approach for PE lysis protocol. IMPRESSION: Successful ultrasound and fluoroscopic bilateral pulmonary arterial catheterizations, limited angiograms, pressure measurements, and advancement of bilateral EKOS infusion catheters to begin PE thrombolysis protocol Electronically Signed   By: Judie Petit.  Shick M.D.   On: 12/19/2015 10:36   Ir Angiogram Selective Each Additional Vessel  Result Date: 12/19/2015 INDICATION: Acute saddle pulmonary embolus with acute cor pulmonale and respiratory distress EXAM: BILATERAL PULMONARY ARTERIOGRAPHY; ADDITIONAL ARTERIOGRAPHY; IR ULTRASOUND GUIDANCE VASC ACCESS RIGHT; IR INFUSION THROMBOL ARTERIAL INITIAL (MS) COMPARISON:  12/19/2015 CTA chest MEDICATIONS: 1% lidocaine ANESTHESIA/SEDATION: None.  Because of her respiratory status. The patient was continuously monitored during the procedure by the interventional radiology nurse under my direct supervision. FLUOROSCOPY TIME:  Fluoroscopy Time: 7 minutes 36 seconds (69.7 mGy). COMPLICATIONS: None immediate. TECHNIQUE: Informed written consent was obtained from the patient after a thorough discussion of the procedural risks, benefits and alternatives. All questions were addressed. Maximal Sterile Barrier Technique was utilized including caps, mask, sterile gowns, sterile gloves, sterile drape, hand hygiene and skin antiseptic. A timeout was performed prior to the initiation of the procedure. Under sterile conditions and local anesthesia, ultrasound micropuncture access performed of the patent right common femoral vein. Images obtained for documentation. This was performed twice to insert adjacent 6 French sheaths into the common femoral vein. Vert catheters and Bentson guidewires were utilized to manipulate the access into the  left and right pulmonary arteries. Pulmonary angiograms performed with hand injections. Right pulmonary artery: Filling defect within the right pulmonary artery visualized compatible with the known acute pulmonary embolus extending into the right lower lobe. Pressure measurements obtained. Right PA pressure: 40/24 (30) Left pulmonary artery: Limited left pulmonary angiogram demonstrates a central hypodense filling defect. Peripheral branches are patent into the lower lobe. Pressure measurements obtained. Left PA pressure: 36/26 (31) Over Rosen guidewires, EKOS infusion catheters with 12 cm infusion lengths were advanced into the pulmonary arteries bilaterally. Contrast injection reconfirms position of both catheters. Inner ultrasound infusion wires advanced into each infusion catheter. Final position confirmed with fluoroscopy. Images obtained for documentation. FINDINGS: Imaging confirms bilateral pulmonary infusion catheter insertion via a right femoral venous approach for PE lysis protocol. IMPRESSION: Successful ultrasound and fluoroscopic bilateral pulmonary arterial catheterizations, limited angiograms, pressure measurements, and advancement of bilateral EKOS infusion catheters to begin PE thrombolysis protocol Electronically Signed   By: Judie Petit.  Shick M.D.   On: 12/19/2015 10:36   Ir Angiogram Selective Each Additional Vessel  Result Date: 12/19/2015 INDICATION: Acute saddle pulmonary embolus with acute cor pulmonale and respiratory distress EXAM: BILATERAL PULMONARY ARTERIOGRAPHY; ADDITIONAL ARTERIOGRAPHY; IR ULTRASOUND GUIDANCE VASC ACCESS RIGHT; IR INFUSION THROMBOL ARTERIAL INITIAL (MS) COMPARISON:  12/19/2015 CTA chest MEDICATIONS: 1% lidocaine ANESTHESIA/SEDATION: None.  Because of her respiratory status. The patient was continuously monitored during the procedure by the interventional radiology nurse under my direct supervision. FLUOROSCOPY TIME:  Fluoroscopy Time: 7 minutes 36 seconds (69.7 mGy).  COMPLICATIONS: None immediate. TECHNIQUE: Informed written consent was obtained from the patient after a thorough discussion of the procedural risks, benefits and alternatives. All questions were addressed. Maximal Sterile Barrier Technique was utilized including caps, mask, sterile gowns, sterile gloves, sterile drape, hand hygiene and skin antiseptic. A timeout was performed prior to the initiation of the procedure. Under sterile conditions and local anesthesia, ultrasound micropuncture access performed of the patent right common femoral vein. Images obtained for documentation. This was performed twice to insert adjacent 6 French sheaths into  the common femoral vein. Vert catheters and Bentson guidewires were utilized to manipulate the access into the left and right pulmonary arteries. Pulmonary angiograms performed with hand injections. Right pulmonary artery: Filling defect within the right pulmonary artery visualized compatible with the known acute pulmonary embolus extending into the right lower lobe. Pressure measurements obtained. Right PA pressure: 40/24 (30) Left pulmonary artery: Limited left pulmonary angiogram demonstrates a central hypodense filling defect. Peripheral branches are patent into the lower lobe. Pressure measurements obtained. Left PA pressure: 36/26 (31) Over Rosen guidewires, EKOS infusion catheters with 12 cm infusion lengths were advanced into the pulmonary arteries bilaterally. Contrast injection reconfirms position of both catheters. Inner ultrasound infusion wires advanced into each infusion catheter. Final position confirmed with fluoroscopy. Images obtained for documentation. FINDINGS: Imaging confirms bilateral pulmonary infusion catheter insertion via a right femoral venous approach for PE lysis protocol. IMPRESSION: Successful ultrasound and fluoroscopic bilateral pulmonary arterial catheterizations, limited angiograms, pressure measurements, and advancement of bilateral EKOS  infusion catheters to begin PE thrombolysis protocol Electronically Signed   By: Judie Petit.  Shick M.D.   On: 12/19/2015 10:36   Ir US Guide Vasc Access Right  Result Date: 12/19/2015 INDICATION: Acute saddle pulmonary embolus with acute cor pulmonale and respiratory distress EXAM: BILATERAL PULMONARY ARTERIOGRAPHY; ADDITIONAL ARTERIOGRAPHY; IR ULTRASOUND GUIDANCE VASC ACCESS RIGHT; IR INFUSION THROMBOL ARTERIAL INITIAL (MS) COMPARISON:  12/19/2015 CTA chest MEDICATIONS: 1% lidocaine ANESTHESIA/SEDATION: None.  Because of her respiratory status. The patient was continuously monitored during the procedure by the interventional radiology nurse under my direct supervision. FLUOROSCOPY TIME:  Fluoroscopy Time: 7 minutes 36 seconds (69.7 mGy). COMPLICATIONS: None immediate. TECHNIQUE: Informed written consent was obtained from the patient after a thorough discussion of the procedural risks, benefits and alternatives. All questions were addressed. Maximal Sterile Barrier Technique was utilized including caps, mask, sterile gowns, sterile gloves, sterile drape, hand hygiene and skin antiseptic. A timeout was performed prior to the initiation of the procedure. Under sterile conditions and local anesthesia, ultrasound micropuncture access performed of the patent right common femoral vein. Images obtained for documentation. This was performed twice to insert adjacent 6 French sheaths into the common femoral vein. Vert catheters and Bentson guidewires were utilized to manipulate the access into the left and right pulmonary arteries. Pulmonary angiograms performed with hand injections. Right pulmonary artery: Filling defect within the right pulmonary artery visualized compatible with the known acute pulmonary embolus extending into the right lower lobe. Pressure measurements obtained. Right PA pressure: 40/24 (30) Left pulmonary artery: Limited left pulmonary angiogram demonstrates a central hypodense filling defect. Peripheral  branches are patent into the lower lobe. Pressure measurements obtained. Left PA pressure: 36/26 (31) Over Rosen guidewires, EKOS infusion catheters with 12 cm infusion lengths were advanced into the pulmonary arteries bilaterally. Contrast injection reconfirms position of both catheters. Inner ultrasound infusion wires advanced into each infusion catheter. Final position confirmed with fluoroscopy. Images obtained for documentation. FINDINGS: Imaging confirms bilateral pulmonary infusion catheter insertion via a right femoral venous approach for PE lysis protocol. IMPRESSION: Successful ultrasound and fluoroscopic bilateral pulmonary arterial catheterizations, limited angiograms, pressure measurements, and advancement of bilateral EKOS infusion catheters to begin PE thrombolysis protocol Electronically Signed   By: Judie Petit.  Shick M.D.   On: 12/19/2015 10:36   Dg Chest Port 1 View  Result Date: 12/20/2015 CLINICAL DATA:  Hypoxia. EXAM: PORTABLE CHEST 1 VIEW COMPARISON:  12/19/2015.  CT 12/19/2015. FINDINGS: Pulmonary artery infusion catheters noted both pulmonary arteries. Mild cardiomegaly. No pulmonary venous congestion. Improvement of right  perihilar infiltrate. Mild right upper lobe and right base atelectasis and or infiltrate. No pleural effusion or pneumothorax. IMPRESSION: 1. Pulmonary artery fusion catheters noted in both pulmonary arteries. 2. Improvement of right perihilar infiltrate. Mild right upper lobe and right lower lobe atelectasis and or infiltrates. 3. Cardiomegaly.  No pulmonary venous congestion. Electronically Signed   By: Maisie Fus  Register   On: 12/20/2015 06:57   Dg Chest Portable 1 View  Result Date: 12/19/2015 CLINICAL DATA:  Shortness of breath tonight. EXAM: PORTABLE CHEST 1 VIEW COMPARISON:  01/14/2007 FINDINGS: New right hilar opacity could represent focal consolidation or developing mass lesion. If patient's symptoms are consistent with pneumonia, would suggest follow-up examination  in 4-6 weeks after appropriate therapy. Otherwise, CT is suggested for further evaluation. Left lung is clear and expanded. Normal heart size and pulmonary vascularity. No blunting of costophrenic angles. No pneumothorax. IMPRESSION: New masslike opacity in the right hilar region could represent pneumonia or mass lesion. See followup recommendations above. Electronically Signed   By: Burman Nieves M.D.   On: 12/19/2015 01:05   Ir Infusion Thrombol Arterial Initial (ms)  Result Date: 12/19/2015 INDICATION: Acute saddle pulmonary embolus with acute cor pulmonale and respiratory distress EXAM: BILATERAL PULMONARY ARTERIOGRAPHY; ADDITIONAL ARTERIOGRAPHY; IR ULTRASOUND GUIDANCE VASC ACCESS RIGHT; IR INFUSION THROMBOL ARTERIAL INITIAL (MS) COMPARISON:  12/19/2015 CTA chest MEDICATIONS: 1% lidocaine ANESTHESIA/SEDATION: None.  Because of her respiratory status. The patient was continuously monitored during the procedure by the interventional radiology nurse under my direct supervision. FLUOROSCOPY TIME:  Fluoroscopy Time: 7 minutes 36 seconds (69.7 mGy). COMPLICATIONS: None immediate. TECHNIQUE: Informed written consent was obtained from the patient after a thorough discussion of the procedural risks, benefits and alternatives. All questions were addressed. Maximal Sterile Barrier Technique was utilized including caps, mask, sterile gowns, sterile gloves, sterile drape, hand hygiene and skin antiseptic. A timeout was performed prior to the initiation of the procedure. Under sterile conditions and local anesthesia, ultrasound micropuncture access performed of the patent right common femoral vein. Images obtained for documentation. This was performed twice to insert adjacent 6 French sheaths into the common femoral vein. Vert catheters and Bentson guidewires were utilized to manipulate the access into the left and right pulmonary arteries. Pulmonary angiograms performed with hand injections. Right pulmonary artery:  Filling defect within the right pulmonary artery visualized compatible with the known acute pulmonary embolus extending into the right lower lobe. Pressure measurements obtained. Right PA pressure: 40/24 (30) Left pulmonary artery: Limited left pulmonary angiogram demonstrates a central hypodense filling defect. Peripheral branches are patent into the lower lobe. Pressure measurements obtained. Left PA pressure: 36/26 (31) Over Rosen guidewires, EKOS infusion catheters with 12 cm infusion lengths were advanced into the pulmonary arteries bilaterally. Contrast injection reconfirms position of both catheters. Inner ultrasound infusion wires advanced into each infusion catheter. Final position confirmed with fluoroscopy. Images obtained for documentation. FINDINGS: Imaging confirms bilateral pulmonary infusion catheter insertion via a right femoral venous approach for PE lysis protocol. IMPRESSION: Successful ultrasound and fluoroscopic bilateral pulmonary arterial catheterizations, limited angiograms, pressure measurements, and advancement of bilateral EKOS infusion catheters to begin PE thrombolysis protocol Electronically Signed   By: Judie Petit.  Shick M.D.   On: 12/19/2015 10:36   Ir Rande Lawman F/u Rhys Martini Art/ven Final Day (ms)  Result Date: 12/20/2015 INDICATION: 65 year old female with history of catheter directed thrombolysis for PE EXAM: IR THROMB F/U EVAL ART/VEN FINAL DAY COMPARISON:  12/19/2015 MEDICATIONS: None. ANESTHESIA/SEDATION: None FLUOROSCOPY TIME:  Fluoroscopy Time: 0 minutes 12 seconds COMPLICATIONS: None  TECHNIQUE: Informed written consent was obtained from the patient after a thorough discussion of the procedural risks, benefits and alternatives. All questions were addressed. Maximal Sterile Barrier Technique was utilized including caps, mask, sterile gowns, sterile gloves, sterile drape, hand hygiene and skin antiseptic. A timeout was performed prior to the initiation of the procedure. Patient positioned  supine position on fluoroscopy table. Scout image was acquired of the chest. Under fluoroscopy, the thrombolysis catheters were withdrawn to the main pulmonary artery. Repeat venous pressures were acquired. All catheters wires and sheaths were removed. Sterile dressing was placed at the right hip. FINDINGS: 38/28 (27), decreased from the initial pressure. IMPRESSION: Status post repeat pulmonary artery pressure in the main pulmonary artery, after full treatment session of bilateral pulmonary arteries with ultrasound accelerated catheter directed thrombolysis. Signed, Yvone Neu. Loreta Ave, DO [facility] Electronically Signed   By: Gilmer Mor D.O.   On: 12/20/2015 10:36   (Echo, Carotid, EGD, Colonoscopy, ERCP)    Subjective:   Discharge Exam: Vitals:   12/21/15 2025 12/22/15 0505  BP: 125/65 124/71  Pulse: 82 66  Resp: 20 16  Temp: 98.3 F (36.8 C) 97.9 F (36.6 C)   Vitals:   12/21/15 1322 12/21/15 1421 12/21/15 2025 12/22/15 0505  BP: 139/64  125/65 124/71  Pulse: 83 (!) 105 82 66  Resp: 18  20 16   Temp: 98 F (36.7 C)  98.3 F (36.8 C) 97.9 F (36.6 C)  TempSrc: Oral  Oral Oral  SpO2: 95% 95% 92% 94%  Weight:    119.1 kg (262 lb 8 oz)  Height:        General: Pt is alert, awake, not in acute distress Cardiovascular: RRR, S1/S2 +, no rubs, no gallops Respiratory: CTA bilaterally, no wheezing, no rhonchi Abdominal: Soft, NT, ND, bowel sounds + Extremities: no edema, no cyanosis    The results of significant diagnostics from this hospitalization (including imaging, microbiology, ancillary and laboratory) are listed below for reference.     Microbiology: Recent Results (from the past 240 hour(s))  MRSA PCR Screening     Status: None   Collection Time: 12/19/15  6:50 AM  Result Value Ref Range Status   MRSA by PCR NEGATIVE NEGATIVE Final    Comment:        The GeneXpert MRSA Assay (FDA approved for NASAL  specimens only), is one component of a comprehensive MRSA colonization surveillance program. It is not intended to diagnose MRSA infection nor to guide or monitor treatment for MRSA infections.      Labs: BNP (last 3 results)  Recent Labs  12/19/15 0142  BNP 20.7   Basic Metabolic Panel:  Recent Labs Lab 12/19/15 0142 12/19/15 0150 12/20/15 0348  NA 137 140 140  K 3.8 3.9 3.7  CL 103 104 112*  CO2 23  --  23  GLUCOSE 215* 211* 122*  BUN 19 23* 12  CREATININE 1.01* 1.00 0.79  CALCIUM 9.4  --  8.1*  MG  --   --  2.0  PHOS  --   --  3.3   Liver Function Tests:  Recent Labs Lab 12/19/15 0142  AST 25  ALT 16  ALKPHOS 83  BILITOT 1.3*  PROT 7.3  ALBUMIN 3.5   No results for input(s): LIPASE, AMYLASE in the last 168 hours. No results for input(s): AMMONIA in the last 168 hours. CBC:  Recent Labs Lab 12/19/15 1450 12/19/15 2050 12/20/15 0348 12/21/15 0243 12/22/15 0235  WBC 11.3* 12.6* 10.9* 8.0 6.7  HGB 11.5* 11.7* 10.6* 10.4* 10.9*  HCT 35.8* 36.4 33.1* 32.5* 34.6*  MCV 83.1 83.5 83.8 83.8 84.4  PLT 158 135* 125* 115* 133*   Cardiac Enzymes:  Recent Labs Lab 12/19/15 1115 12/19/15 1743 12/20/15 0348  TROPONINI 1.50* 1.53* 0.98*   BNP: Invalid input(s): POCBNP CBG:  Recent Labs Lab 12/20/15 1118 12/20/15 1611 12/20/15 2101 12/21/15 0006 12/21/15 0447  GLUCAP 129* 138* 123* 129* 127*   D-Dimer No results for input(s): DDIMER in the last 72 hours. Hgb A1c No results for input(s): HGBA1C in the last 72 hours. Lipid Profile No results for input(s): CHOL, HDL, LDLCALC, TRIG, CHOLHDL, LDLDIRECT in the last 72 hours. Thyroid function studies No results for input(s): TSH, T4TOTAL, T3FREE, THYROIDAB in the last 72 hours.  Invalid input(s): FREET3 Anemia work up No results for input(s): VITAMINB12, FOLATE, FERRITIN, TIBC, IRON, RETICCTPCT in the last 72 hours. Urinalysis    Component Value Date/Time   COLORURINE YELLOW 01/14/2007  0910   APPEARANCEUR CLOUDY (A) 01/14/2007 0910   LABSPEC 1.014 01/14/2007 0910   PHURINE 6.5 01/14/2007 0910   GLUCOSEU NEGATIVE 01/14/2007 0910   HGBUR NEGATIVE 01/14/2007 0910   BILIRUBINUR NEGATIVE 01/14/2007 0910   KETONESUR NEGATIVE 01/14/2007 0910   PROTEINUR NEGATIVE 01/14/2007 0910   UROBILINOGEN 1.0 01/14/2007 0910   NITRITE NEGATIVE 01/14/2007 0910   LEUKOCYTESUR  01/14/2007 0910    NEGATIVE MICROSCOPIC NOT DONE ON URINES WITH NEGATIVE PROTEIN, BLOOD, LEUKOCYTES, NITRITE, OR GLUCOSE <1000 mg/dL.   Sepsis Labs Invalid input(s): PROCALCITONIN,  WBC,  LACTICIDVEN Microbiology Recent Results (from the past 240 hour(s))  MRSA PCR Screening     Status: None   Collection Time: 12/19/15  6:50 AM  Result Value Ref Range Status   MRSA by PCR NEGATIVE NEGATIVE Final    Comment:        The GeneXpert MRSA Assay (FDA approved for NASAL specimens only), is one component of a comprehensive MRSA colonization surveillance program. It is not intended to diagnose MRSA infection nor to guide or monitor treatment for MRSA infections.      Time coordinating discharge: Over 30 minutes  SIGNED:   Clint Lipps, MD  [facility] 12/22/2015, 9:36 AM Pager   If 7PM-7AM, please contact night-coverage [email or web address] Password [email or web address]

## 2015-12-22 NOTE — Discharge Instructions (Signed)
Information on my medicine - ELIQUIS (apixaban)  This medication education was reviewed with me or my healthcare representative as part of my discharge preparation.  The pharmacist that spoke with me during my hospital stay was:  Georgina Peer, [facility]  Why was Eliquis prescribed for you? Eliquis was prescribed to treat blood clots that may have been found in the veins of your legs (deep vein thrombosis) or in your lungs (pulmonary embolism) and to reduce the risk of them occurring again.  What do You need to know about Eliquis ? The starting dose is 10 mg (two 5 mg tablets) taken TWICE daily for the FIRST SEVEN (7) DAYS, then on 12/28/15 with evening dose the dose is reduced to ONE 5 mg tablet taken TWICE daily.  Eliquis may be taken with or without food.   Try to take the dose about the same time in the morning and in the evening. If you have difficulty swallowing the tablet whole please discuss with your pharmacist how to take the medication safely.  Take Eliquis exactly as prescribed and DO NOT stop taking Eliquis without talking to the doctor who prescribed the medication.  Stopping may increase your risk of developing a new blood clot.  Refill your prescription before you run out.  After discharge, you should have regular check-up appointments with your healthcare provider that is prescribing your Eliquis.    What do you do if you miss a dose? If a dose of ELIQUIS is not taken at the scheduled time, take it as soon as possible on the same day and twice-daily administration should be resumed. The dose should not be doubled to make up for a missed dose.  Important Safety Information A possible side effect of Eliquis is bleeding. You should call your healthcare provider right away if you experience any of the following: ? Bleeding from an injury or your nose that does not stop. ? Unusual colored urine (red or dark brown) or unusual colored stools (red or black). ? Unusual bruising  for unknown reasons. ? A serious fall or if you hit your head (even if there is no bleeding).  Some medicines may interact with Eliquis and might increase your risk of bleeding or clotting while on Eliquis. To help avoid this, consult your healthcare provider or pharmacist prior to using any new prescription or non-prescription medications, including herbals, vitamins, non-steroidal anti-inflammatory drugs (NSAIDs) and supplements.  This website has more information on Eliquis (apixaban): http://www.eliquis.com/eliquis/home

## 2015-12-25 LAB — PROTHROMBIN GENE MUTATION

## 2015-12-25 LAB — FACTOR 5 LEIDEN

## 2015-12-26 ENCOUNTER — Telehealth: Payer: Self-pay | Admitting: Vascular Surgery

## 2015-12-26 LAB — PROTHROMBIN GENE MUTATION

## 2015-12-26 NOTE — Telephone Encounter (Signed)
-----   Message from Rica Records, RN sent at 12/25/2015  4:44 PM EDT ----- Regarding: FW: pt question Mrs. Nelon is on an anticoagulant (Eliquis) that was prescribed by Verlee Monte MD.  That MD should be managing the anticoagulant treatment and follow up.      ----- Message ----- From: Rica Records, RN Sent: 12/25/2015   4:33 PM To: Tye Maryland Roczniak Subject: RE: pt question                                DVTs are managed by PCPs as they will put the patient's on anticoagulants and follow the patient/ monitor the anticoagulant medication closely.     ----- Message ----- From: Georgiann Mccoy Sent: 12/25/2015   4:14 PM To: Rica Records, RN Subject: pt question                                    Hi Sonya,  This pt left a message saying that she recently went to [facility] b/c of PE. While there they discovered a large clot in her R LE and a DVT in her L LE. DVTs are usually managed by the pt's PCP.  She was a laser abl pt of TFE's in 2010.  I'm not sure how to proceed with this pt.  Thank you, Selinda Eon

## 2015-12-26 NOTE — Telephone Encounter (Signed)
Spoke to pt to inform her of this. Pt was understanding.

## 2015-12-31 ENCOUNTER — Telehealth: Payer: Self-pay

## 2015-12-31 NOTE — Telephone Encounter (Signed)
Spoke with pt and made hosp f/u appt for this Wednesday. Pt aware. Pt advised to come in 35mins early for paperwork. Nothing further needed.

## 2016-01-02 ENCOUNTER — Ambulatory Visit (INDEPENDENT_AMBULATORY_CARE_PROVIDER_SITE_OTHER): Payer: No Typology Code available for payment source | Admitting: Adult Health

## 2016-01-02 ENCOUNTER — Other Ambulatory Visit: Payer: Medicare Other

## 2016-01-02 ENCOUNTER — Encounter: Payer: Self-pay | Admitting: Adult Health

## 2016-01-02 VITALS — BP 134/82 | HR 80 | Ht 62.0 in | Wt 260.0 lb

## 2016-01-02 DIAGNOSIS — I2602 Saddle embolus of pulmonary artery with acute cor pulmonale: Secondary | ICD-10-CM

## 2016-01-02 NOTE — Progress Notes (Signed)
Note reviewed.  Sonia Baller Ashok Cordia, M.D. [facility] Pager:  [phone] After 3pm or if no response, call [phone] 12:19 PM 01/02/16

## 2016-01-02 NOTE — Progress Notes (Signed)
Chief Complaint  Patient presents with  . Hospitalization Follow-up    Discharged on 12/22/2015     Tests CTA chest 12/19/15>>> Positive for acute PE with CT evidence of right heart strain (RV/LV Ratio = 2) consistent with at least submassive (intermediate risk) PE.  2. Large area of consolidation in the posterior aspect of the right upper lobe and right lower lobe superior segment, extending into the right posteromedial base. This could represent pulmonary infarction. Pneumonia or hemorrhage not excluded. 2D echo 12/20/15 (post catheter directed thrombolysis)>>> - LVEF is normal. Impaired relaxation with normal filling pressures.  Right ventricle is moderately dilated with decreased systolic function.  Past medical hx Past Medical History:  Diagnosis Date  . Anemia   . Barrett's esophagus   . GERD (gastroesophageal reflux disease)   . Hypothyroidism   . Murmur   . OA (osteoarthritis)   . Obese   . PE (pulmonary embolism) 12/21/2015  . Plantar fasciitis      Past surgical hx, Allergies, Family hx, Social hx all reviewed.  Current Outpatient Prescriptions on File Prior to Visit  Medication Sig  . apixaban (ELIQUIS) 5 MG TABS tablet Take 2 tablets (10 mg) by mouth BID for 1 week, then 1 tablet (5 mg) twice a day.  Marland Kitchen HYDROcodone-acetaminophen (NORCO/VICODIN) 5-325 MG tablet Take 1 tablet by mouth every 6 (six) hours as needed for moderate pain.  . pantoprazole (PROTONIX) 40 MG tablet Take 1 tablet (40 mg total) by mouth daily.   No current facility-administered medications on file prior to visit.      Vital Signs BP 134/82 (BP Location: Right Arm, Cuff Size: Normal)   Pulse 80   Ht 5\' 2"  (1.575 m)   Wt 260 lb (117.9 kg)   SpO2 98%   BMI 47.55 kg/m   History of Present Illness [name] is a 65 y.o. female with hx chronic back pain s/p multiple lumbar surgeries most recently 10/31/15, barrett's esophagus and anemia with recent hospitalization 8/30-9/2.  She  presented with SOB and near syncope and was profoundly hypoxic with O2 sats 60's on arrival to ER.  She was placed on bipap and CTA chest demonstrated large saddle PE with RV strain as well as R sided consolidation. She underwent catheter directed lysis and was discharged on eliquis with plans for 6 months of treatment.  She also completed 7 days empiric abx for CAP which was more likely pulmonary infarct.  She returns today for hospital follow up. She is feeling much better but does c/o general malaise.  She denies SOB, chest pain, leg/calf swelling. She does c/o mild LLE pain near groin.  She seems angry and is blaming the surgeon for not putting her on anticoagulation after her surgery since they "knew" she had a family hx of VTE.  Wants to go back to her post op PT.    Physical Exam  General - obese female, No distress  ENT - No sinus tenderness, no oral exudate, no LAN Cardiac - s1s2 regular, no murmur Chest - resps even non labored on RA, No wheeze/rales/dullness Back - No focal tenderness Abd - Soft, non-tender Ext - scant BLE edema  Neuro - Normal strength Skin - No rashes Psych - normal mood, and behavior   Assessment/Plan  PE/DVT - Will treat as provoked for now after back surgery.  Although pt does have family hx.  Will continue 6 months eliquis then stop and do full hypercoag w/u and determine need for continued  anticoagulation at that time.  Will send [place] and Prothrombin gene mutation today.    Patient Instructions  Ok to resume physical therapy for back surgery  Lab work today  Continue eliquis  Need to establish a PCP  Stand/stretch frequently at work or do leg exercises at your desk  Follow up in 6 months      Nickolas Madrid, NP 01/02/2016  12:09 PM

## 2016-01-02 NOTE — Patient Instructions (Addendum)
Ok to resume physical therapy for back surgery  Lab work today  Continue eliquis  Need to establish a PCP  Stand/stretch frequently at work or do leg exercises at your desk  Follow up in 6 months

## 2016-01-06 LAB — FACTOR 5 LEIDEN

## 2016-01-09 ENCOUNTER — Telehealth: Payer: Self-pay | Admitting: Pulmonary Disease

## 2016-01-09 NOTE — Telephone Encounter (Signed)
Called spoke with pt. She is requesting results from her labs that were drawn at her ov with Tiffany Kocher on 01/02/16. I explained to her that I would send a message to [facility] and return her call once we receive recs. She voiced understanding and had no further questions.   Katie please advise

## 2016-01-11 NOTE — Telephone Encounter (Signed)
Patient calling back regarding results -pr  °

## 2016-01-11 NOTE — Telephone Encounter (Signed)
Spoke with pt, requesting results.  I advised that we were still awaiting results, and that we would call her with results as soon as we have them.  Pt expressed understanding.  Katie please advise on pt's labs.  Thanks.

## 2016-01-18 NOTE — Telephone Encounter (Signed)
The genetic test for Factor V was negative. Her Prothrombin Gene Mutation is still "Active" and doesn't appear to have been collected. JN.

## 2016-01-18 NOTE — Telephone Encounter (Signed)
Pt is requesting her lab results. Dr. Ashok Cordia did sign off pt note from seeing Katie. Please advise Dr. Ashok Cordia on the results thanks

## 2016-01-18 NOTE — Telephone Encounter (Signed)
Spoke with pt, aware of recs and results.  Nothing further needed.

## 2016-01-18 NOTE — Telephone Encounter (Signed)
Called Cassie in the lab to seewhy prothrombin gene mutation was not collected- per Cassie the lab was ordered incorrectly, it was placed as "normal" when outpatient labs must be "future".   Called pt to relay factor V results. I asked pt to come in and have other lab redrawn- she states she had this done on 12/20/15 (results available in Epic), and wants to know if she needs to have this redrawn.    JN please advise.  Thanks.

## 2016-01-18 NOTE — Telephone Encounter (Signed)
Patient calling for results - she can be reached [phone]

## 2016-01-18 NOTE — Telephone Encounter (Signed)
The patient is absolutely right and doesn't require the test repeated.

## 2016-01-21 ENCOUNTER — Ambulatory Visit (INDEPENDENT_AMBULATORY_CARE_PROVIDER_SITE_OTHER): Payer: No Typology Code available for payment source | Admitting: Primary Care

## 2016-01-21 ENCOUNTER — Encounter: Payer: Self-pay | Admitting: Primary Care

## 2016-01-21 VITALS — BP 124/82 | HR 79 | Temp 97.9°F | Ht 62.0 in | Wt 263.4 lb

## 2016-01-21 DIAGNOSIS — K2270 Barrett's esophagus without dysplasia: Secondary | ICD-10-CM | POA: Diagnosis not present

## 2016-01-21 DIAGNOSIS — I82491 Acute embolism and thrombosis of other specified deep vein of right lower extremity: Secondary | ICD-10-CM | POA: Diagnosis not present

## 2016-01-21 DIAGNOSIS — I2699 Other pulmonary embolism without acute cor pulmonale: Secondary | ICD-10-CM | POA: Diagnosis not present

## 2016-01-21 DIAGNOSIS — J189 Pneumonia, unspecified organism: Secondary | ICD-10-CM

## 2016-01-21 DIAGNOSIS — E119 Type 2 diabetes mellitus without complications: Secondary | ICD-10-CM

## 2016-01-21 DIAGNOSIS — Z9889 Other specified postprocedural states: Secondary | ICD-10-CM

## 2016-01-21 DIAGNOSIS — E0590 Thyrotoxicosis, unspecified without thyrotoxic crisis or storm: Secondary | ICD-10-CM | POA: Insufficient documentation

## 2016-01-21 DIAGNOSIS — R946 Abnormal results of thyroid function studies: Secondary | ICD-10-CM

## 2016-01-21 NOTE — Assessment & Plan Note (Signed)
Completed in mid July 2017. Has scheduled follow up with spinal surgeon next week.

## 2016-01-21 NOTE — Assessment & Plan Note (Signed)
Gradual decline in T3 via labs completed through occupation per patient. FH of hyperthyroidism. TSH stable. She will see endocrinology next month.

## 2016-01-21 NOTE — Assessment & Plan Note (Signed)
Clear lungs today. Completed Augmentin course several weeks ago.

## 2016-01-21 NOTE — Progress Notes (Signed)
Subjective:    Patient ID: [name], female    DOB: 12-17-1950, 65 y.o.   MRN: [id number]  HPI  [name] is 65 year old female who presents today to establish care and discuss the problems mentioned below. Will obtain old records.  1) Hospital Follow Up: Presented to the [facility] on 08/30 with a chief complaint of shortness of breath with near syncope. Her symptoms began during the week of 12/16/15. She was admitted to [facility] on 10/31/15 for revision of posterior spinal fusion on lumbar facets and on the transverse processes at L4-5. She was discharged home on 11/02/15. She did not have complications initially post discharge. She presented to the emergency department on 08/30 with low oxygen saturations in the 60's with profound work of breathing. She was placed on BiPaP with 100% FiO2 with improvement in saturations. Chest xray with concern for pulmonary infarct, CT angio demonstrated large saddle PE with RV/LV ration suggesting RV strain. She was admitted to [facility] for further treatment and evaluation.   During her hospitalization she was initiated on Heparin, BiPaP, completed cardiac work up, IV fluid hydration. She was consulted by vascular who recommended and completed emergent EKOS PE lysis protocol. She has a known DVT to her right lower extremity that was later concluded. She completed a cardiac echocardiogram with 60-65% EF and reduction in right ventricular systolic function. Her BiPap was discontinued on 08/31 as her shortness of breath and oxygen saturation improved. There was concern for right base pneumonia and was therefore covered with Ceftriaxone. Troponin levels were negative. She was transitioned over to Eliquis on 09/01 and initiated on oral Augmentin for a 7 day course.   She was discharged home on 09/02 with oral antibiotics, Eliquis, and recommendations for pulmonary and PCP follow up. Since her discharge home she's completed her Augmentin and is compliant to  Eliquis 5 mg. She does experience some dyspnea, but this is improving gradually. She was evaluated by Pulmonology 2 weeks ago who completed blood work including clotting studies which were negative. She's attempted to schedule an appointment with her Vascular provider and was told by their nurse that she didn't need to schedule. She follows with Dr. Arbie Cookey since 2005. She completed vascular surgery in 2006.   2) Barrett's Esophagus: Diagnosed in 2003. Currently managed on Pantoprazole 40 mg. Her last endoscopy was in 2014 which showed slight deterioration, but nothing significant. She is due for repeat endoscopy in 2017. She is also due for colonoscopy.  3) Chronic Back Pain: Recent spinal fusion repair to L4-5. She is currently managed on Norco from her surgery that she takes infrequently. She has an appointment Friday this week with her spinal surgeon.   4) Abnormal thyroid test: Completed routine testing through work and has had a graudal decrease in thyroid (T3) and increase in A1C. Her A1C was 6.9 in June 2017. She has an appointment scheduled in November with an endocrinologist. TSH unremarkable.   Review of Systems  Constitutional: Negative for chills and fever.  HENT: Negative for congestion.   Respiratory: Negative for cough, shortness of breath and wheezing.   Cardiovascular: Negative for chest pain.  Gastrointestinal:       GERD  Endocrine: Negative for polyuria.  Musculoskeletal: Positive for arthralgias and back pain.  Skin: Negative for color change.  Neurological: Negative for dizziness and headaches.  Hematological: Negative for adenopathy.       Past Medical History:  Diagnosis Date  . Anemia   .  Barrett's esophagus   . GERD (gastroesophageal reflux disease)   . Hypothyroidism   . Murmur   . OA (osteoarthritis)   . Obese   . PE (pulmonary embolism) 12/21/2015  . Plantar fasciitis      Social History   Social History  . Marital status: Married    Spouse name:  N/A  . Number of children: 3  . Years of education: N/A   Occupational History  .      Telephone sales   Social History Main Topics  . Smoking status: Never Smoker  . Smokeless tobacco: Never Used  . Alcohol use No  . Drug use: No  . Sexual activity: Not on file   Other Topics Concern  . Not on file   Social History Narrative   Married.   3 children, 2 grandchildren.   Work's in a call center.   Enjoys reading, sewing, spending time with her grandchildren.    Past Surgical History:  Procedure Laterality Date  . ABDOMINAL HYSTERECTOMY    . Arthroscopic surgery left knee    . BACK SURGERY    . CESAREAN SECTION    . CHOLECYSTECTOMY    . IR GENERIC HISTORICAL  12/19/2015   IR ANGIOGRAM SELECTIVE EACH ADDITIONAL VESSEL 12/19/2015 [name], MD [facility]  . IR GENERIC HISTORICAL  12/19/2015   IR ANGIOGRAM PULMONARY BILATERAL SELECTIVE 12/19/2015 [name], MD [facility]  . IR GENERIC HISTORICAL  12/19/2015   IR INFUSION THROMBOL ARTERIAL INITIAL (MS) 12/19/2015 [name], MD [facility]  . IR GENERIC HISTORICAL  12/19/2015   IR US GUIDE VASC ACCESS RIGHT 12/19/2015 [name], MD [facility]  . IR GENERIC HISTORICAL  12/19/2015   IR ANGIOGRAM SELECTIVE EACH ADDITIONAL VESSEL 12/19/2015 [name], MD [facility]  . IR GENERIC HISTORICAL  12/20/2015   IR THROMB F/U EVAL ART/VEN FINAL DAY (MS) 12/20/2015 Gilmer Mor, DO [facility]  . Right knee replacement    . SPINAL FUSION  10/31/2015   revision at [facility]   . TONSILLECTOMY      Family History  Problem Relation Age of Onset  . Stroke Maternal Grandmother   . Bladder Cancer Maternal Grandmother   . Hyperthyroidism Mother     Radioactive Iodine Treatment  . Hypothyroidism Mother   . Cervical cancer Maternal Aunt     Allergies  Allergen Reactions  . Ciprofloxacin Anaphylaxis  . Doxycycline Anaphylaxis  . Strawberry Extract Anaphylaxis  . Tetracyclines & Related     Current  Outpatient Prescriptions on File Prior to Visit  Medication Sig Dispense Refill  . apixaban (ELIQUIS) 5 MG TABS tablet Take 2 tablets (10 mg) by mouth BID for 1 week, then 1 tablet (5 mg) twice a day. 60 tablet 5  . HYDROcodone-acetaminophen (NORCO/VICODIN) 5-325 MG tablet Take 1 tablet by mouth every 6 (six) hours as needed for moderate pain.    . pantoprazole (PROTONIX) 40 MG tablet Take 1 tablet (40 mg total) by mouth daily. 30 tablet 0   No current facility-administered medications on file prior to visit.     BP 124/82   Pulse 79   Temp 97.9 F (36.6 C) (Oral)   Ht 5\' 2"  (1.575 m)   Wt 263 lb 6.4 oz (119.5 kg)   SpO2 98%   BMI 48.18 kg/m    Objective:   Physical Exam  Constitutional: She is oriented to person, place, and time. She appears well-nourished.  Neck: Neck supple.  Cardiovascular: Normal rate  and regular rhythm.   Pulmonary/Chest: Effort normal and breath sounds normal.  Musculoskeletal:  Ambulatory with cane.   Neurological: She is alert and oriented to person, place, and time.  Skin: Skin is warm and dry.  Psychiatric: She has a normal mood and affect.          Assessment & Plan:  Hospital Follow Up:  Admitted to [facility] on 08/30 for acute massive PE with infarct. History of spinal surgery in mid July 2017. She does continue to experience some dyspnea, however overall improved. Also with right lower extremity DVT. Currently managed on Eliquis 5 mg.  Exam today overall stable. Clear lungs, no distress, vitals stable. She has followed with pulmonology, needs referral for vascular follow up which was placed today. She is worried that she will develop additional clots, reassurance provided regarding Eliquis. Reminded her to ambulate regularly while at work as she works in a sedentary environment.   Continue Eliquis, and necessary follow up. All hospital labs, imaging, notes reviewed.  Morrie Sheldon, NP

## 2016-01-21 NOTE — Assessment & Plan Note (Signed)
Managed on pantoprazole 40 mg. Due for repeat endoscopy in 2017. She plans on scheduling.

## 2016-01-21 NOTE — Progress Notes (Signed)
Pre visit review using our clinic review tool, if applicable. No additional management support is needed unless otherwise documented below in the visit note. 

## 2016-01-21 NOTE — Patient Instructions (Addendum)
Stop by the front desk and speak with either Rosaria Ferries or Ebony Hail regarding your referral to Dr. Luther Parody office.  Continue Eliquis tablets for elimination and prevention of blood clots. Remember to remain active as discussed.  Follow up with endocrinology as scheduled. Please update me regarding this visit.  Discuss a return to work date with your Spinal doctor.  It was a pleasure to meet you today! Please don't hesitate to call me with any questions. Welcome to [facility]!

## 2016-01-21 NOTE — Assessment & Plan Note (Signed)
A1C of 6.9 in June 2017.  She has an appointment with endocrinology next month that she self scheduled.

## 2016-01-22 ENCOUNTER — Encounter (HOSPITAL_COMMUNITY): Payer: Self-pay | Admitting: Interventional Radiology

## 2016-01-22 ENCOUNTER — Telehealth: Payer: Self-pay | Admitting: Primary Care

## 2016-01-22 NOTE — Telephone Encounter (Signed)
Received a call from Dr Luther Parody office regarding Referral to Dr Donnetta Hutching. Dr Luther Parody nurse said this is not a problem that they would see the patient for. Patient is already on an anticoagulant and they would  be happy to do any Ultrasounds that need to be done but patient doesn't need to be seen by Vascular Surgeon at this time. They called the patient and told her what Dr Early said and they told her to followup with her PCP. I will cancel the Referral to Vascular Surgery.

## 2016-01-22 NOTE — Telephone Encounter (Signed)
Spoken and notified patient of Kate's comments. Patient verbalized understanding. 

## 2016-01-22 NOTE — Telephone Encounter (Signed)
Noted. Please inform patient of note below.

## 2016-02-06 ENCOUNTER — Telehealth: Payer: Self-pay | Admitting: Pulmonary Disease

## 2016-02-06 NOTE — Telephone Encounter (Signed)
I received restrictions form from [facility] via interoffice mail from [employer]. Fwd to [place] to have JN complete -pr

## 2016-02-06 NOTE — Telephone Encounter (Signed)
Rec'd disability forms from [facility] from [employer] - he didn't take patient out of work or give her restrictions - she did have back surgery - called [employer] and advised per JN ppw should go to ortho -pr

## 2016-02-12 ENCOUNTER — Telehealth: Payer: Self-pay | Admitting: Primary Care

## 2016-02-12 DIAGNOSIS — R000 Tachycardia, unspecified: Secondary | ICD-10-CM

## 2016-02-12 MED ORDER — METOPROLOL SUCCINATE ER 25 MG PO TB24: 25.0000 mg | ORAL_TABLET | Freq: Every day | ORAL | 0 refills | Status: DC

## 2016-02-12 NOTE — Telephone Encounter (Signed)
Pt came in having trouble with her afib. She is requesting medication to regulate her afib. Please call with any questions.

## 2016-02-12 NOTE — Telephone Encounter (Signed)
Noted. Spoke with patient this morning. HR will elevate to 130's with most household chores, light walking. HR at rest running 70-80's. Will trial low dose Toprol XL 25 mg for heart rate control. She is to take 1 tablet by mouth every morning. Have her monitor her HR and follow up with me in 2-3 weeks for re-evaluation.

## 2016-02-12 NOTE — Telephone Encounter (Signed)
Spoken and notified patient of Kate's comments. Patient verbalized understanding.  Patient will call back to schedule the follow up

## 2016-02-22 ENCOUNTER — Ambulatory Visit (INDEPENDENT_AMBULATORY_CARE_PROVIDER_SITE_OTHER): Payer: No Typology Code available for payment source | Admitting: Internal Medicine

## 2016-02-22 VITALS — BP 122/80 | HR 80 | Ht 62.0 in | Wt 260.0 lb

## 2016-02-22 DIAGNOSIS — R7309 Other abnormal glucose: Secondary | ICD-10-CM | POA: Diagnosis not present

## 2016-02-22 DIAGNOSIS — E119 Type 2 diabetes mellitus without complications: Secondary | ICD-10-CM | POA: Diagnosis not present

## 2016-02-22 DIAGNOSIS — E0590 Thyrotoxicosis, unspecified without thyrotoxic crisis or storm: Secondary | ICD-10-CM

## 2016-02-22 LAB — TSH: TSH: 1.99 u[IU]/mL (ref 0.35–4.50)

## 2016-02-22 LAB — T3, FREE: T3, Free: 3.5 pg/mL (ref 2.3–4.2)

## 2016-02-22 LAB — T4, FREE: Free T4: 0.76 ng/dL (ref 0.60–1.60)

## 2016-02-22 LAB — HEMOGLOBIN A1C: Hgb A1c MFr Bld: 6.6 % — ABNORMAL HIGH (ref 4.6–6.5)

## 2016-02-22 NOTE — Progress Notes (Signed)
Patient ID: [name], female   DOB: 08/26/1950, 65 y.o.   MRN: [id number]    HPI  [name] is a 65 y.o.-year-old female, referred by her PCP, Morrie Sheldon, NP, for evaluation for possible thyroid disease (family history of hyperthyroidism) and elevated HbA1c. His husband is also my pt, [name].  Family history of thyroid disease: - Patient describes a lifelong history of being overweight, sleeping poorly, being cold-intolerant, and having fatigue - She would like to be evaluated for thyroid disease - She had labs in 09/2015 that showed a low T3 uptake, but a normal TSH and total T4  I reviewed pt's thyroid tests: Lab Results  Component Value Date   TSH 3.380 09/24/2015    Pt describes: - + weight gain and loss - 16 lbs since starting Eloquis In 11/2015 (believes she eliminated lymphedema water) - + fatigue, + insomnia - + cold intolerance - no depression - no constipation - + dry skin - + hair loss  Pt denies feeling nodules in neck, + hoarseness, + dysphagia (has Barrett's esophagus)/no odynophagia, no SOB with lying down.  Pt does have a FH of thyroid ds. In mother: hyperthyroidism when pregnant with the pt. No FH of thyroid cancer. No h/o radiation tx to head or neck.  No h/o radiation tx to head or neck. No recent use of iodine supplements.  Elevated HbA1c: - Had 1 elevated HbA1c after steroids for back pain  Last HbA1c: Lab Results  Component Value Date   HGBA1C 6.9 (H) 09/24/2015  Prev.: 5.9%  Diet: - Breakfast: Sausage biscuit - Lunch: Meat loaf, fish, or pasta - Dinner: Small - Snacks: 1 a day: String cheese or piece of candy, 100-calorie snack  Pt. was in the ED 12/19/2015 for a saddle PE. On Eloquis now. She was advised to start Toprol XL 25 mg daily >> did not start yet. She also has a history of back surgery 10/31/2015. Previous back surgery in 2005. She had multiple courses of steroids, not recently.    ROS: Constitutional: + See history of present illness, + nocturia Eyes: no blurry vision, no xerophthalmia ENT: no sore throat, + see history of present illness Cardiovascular: no CP/+ SOB/+ palpitations/+ leg swelling Respiratory: no cough/+ SOB Gastrointestinal: no N/V/D/C/+ heartburn Musculoskeletal: no muscle/+ joint aches Skin: no rashes, + excessive hair growth Neurological: no tremors/numbness/tingling/dizziness Psychiatric: no depression/anxiety  Past Medical History:  Diagnosis Date  . Anemia   . Barrett's esophagus   . GERD (gastroesophageal reflux disease)   . Hypothyroidism   . Murmur   . OA (osteoarthritis)   . Obese   . PE (pulmonary embolism) 12/21/2015  . Plantar fasciitis    Past Surgical History:  Procedure Laterality Date  . ABDOMINAL HYSTERECTOMY    . Arthroscopic surgery left knee    . BACK SURGERY    . CESAREAN SECTION    . CHOLECYSTECTOMY    . IR GENERIC HISTORICAL  12/19/2015   IR ANGIOGRAM SELECTIVE EACH ADDITIONAL VESSEL 12/19/2015 [name], MD [facility]  . IR GENERIC HISTORICAL  12/19/2015   IR ANGIOGRAM PULMONARY BILATERAL SELECTIVE 12/19/2015 [name], MD [facility]  . IR GENERIC HISTORICAL  12/19/2015   IR INFUSION THROMBOL ARTERIAL INITIAL (MS) 12/19/2015 [name], MD [facility]  . IR GENERIC HISTORICAL  12/19/2015   IR US GUIDE VASC ACCESS RIGHT 12/19/2015 [name], MD [facility]  . IR GENERIC HISTORICAL  12/19/2015   IR ANGIOGRAM SELECTIVE EACH ADDITIONAL VESSEL 12/19/2015  [name], MD [facility]  . IR GENERIC HISTORICAL  12/20/2015   IR THROMB F/U EVAL ART/VEN FINAL DAY (MS) 12/20/2015 Gilmer Mor, DO [facility]  . IR GENERIC HISTORICAL  12/19/2015   IR INFUSION THROMBOL ARTERIAL INITIAL (MS) 12/19/2015 [name], MD [facility]  . Right knee replacement    . SPINAL FUSION  10/31/2015   revision at [facility]   . TONSILLECTOMY     Social History   Social History  . Marital status:  Married    Spouse name: N/A  . Number of children: 3  . Years of education: N/A   Occupational History  .      Telephone sales   Social History Main Topics  . Smoking status: Never Smoker  . Smokeless tobacco: Never Used  . Alcohol use No  . Drug use: No  . Sexual activity: Not on file   Other Topics Concern  . Not on file   Social History Narrative   Married.   3 children, 2 grandchildren.   Work's in a call center.   Enjoys reading, sewing, spending time with her grandchildren.   Current Outpatient Prescriptions on File Prior to Visit  Medication Sig Dispense Refill  . apixaban (ELIQUIS) 5 MG TABS tablet Take 2 tablets (10 mg) by mouth BID for 1 week, then 1 tablet (5 mg) twice a day. 60 tablet 5  . HYDROcodone-acetaminophen (NORCO/VICODIN) 5-325 MG tablet Take 1 tablet by mouth every 6 (six) hours as needed for moderate pain.    . pantoprazole (PROTONIX) 40 MG tablet Take 1 tablet (40 mg total) by mouth daily. 30 tablet 0  . metoprolol succinate (TOPROL-XL) 25 MG 24 hr tablet Take 1 tablet (25 mg total) by mouth daily. (Patient not taking: Reported on 02/22/2016) 30 tablet 0   No current facility-administered medications on file prior to visit.    Allergies  Allergen Reactions  . Ciprofloxacin Anaphylaxis  . Doxycycline Anaphylaxis  . Strawberry Extract Anaphylaxis  . Tetracyclines & Related    Family History  Problem Relation Age of Onset  . Stroke Maternal Grandmother   . Bladder Cancer Maternal Grandmother   . Hyperthyroidism Mother     Radioactive Iodine Treatment  . Hypothyroidism Mother   . Cervical cancer Maternal Aunt     PE: BP 122/80 (BP Location: Left Arm, Patient Position: Sitting)   Pulse 80   Ht 5\' 2"  (1.575 m)   Wt 260 lb (117.9 kg)   SpO2 97%   BMI 47.55 kg/m  Wt Readings from Last 3 Encounters:  02/22/16 260 lb (117.9 kg)  01/21/16 263 lb 6.4 oz (119.5 kg)  01/02/16 260 lb (117.9 kg)   Constitutional: Obese, in NAD Eyes: PERRLA,  EOMI, no exophthalmos ENT: moist mucous membranes, no thyromegaly, no cervical lymphadenopathy Cardiovascular: RRR, No MRG, + lymphedema bilaterally Respiratory: CTA B Gastrointestinal: abdomen soft, NT, ND, BS+ Musculoskeletal: no deformities, strength intact in all 4 Skin: moist, warm, no rashes Neurological: no tremor with outstretched hands, DTR normal in all 4  ASSESSMENT: 1. Family history of Hypothyroidism  2. Elevated HbA1c   PLAN:  1. Patient has a family history of hyperthyroidism in mother, but she was never diagnosed with thyroid disease. She has multiple symptoms, of which some could be hypothyroid, but they are nonspecific: Weight gain, fatigue cold intolerance. She is worried about the decreased T3 uptake on her most recent TFT panel from 09/2015. I explained that this is an old test, and is  used evaluate the free thyroid hormone levels. I would like to check these levels before making a comment whether she has thyroid disease or not. Her TSH was normal, though. We will add her anti-thyroid antibodies to check for Hashimoto's thyroiditis. - she does not appear to have a goiter, thyroid nodules, or neck compression symptoms - will check thyroid tests today: TSH, free T4, Free T3, TPO and ATA antibodies - If labs today are abnormal, she will need to return in ~6 weeks for repeat labs - Otherwise, I will see her back in 6 months  2. Elevated HbA1c - Patient normal HbA1c levels, but this returned in the diabetic range in 09/2015. - She describes multiple courses of prednisone for back pain before her back surgery in 10/2015. - We will recheck her HbA1c today and may need to bring her back sooner than 6 months if this is elevated  Component     Latest Ref Rng & Units 02/22/2016  TSH     0.35 - 4.50 uIU/mL 1.99  Hemoglobin A1C     4.6 - 6.5 % 6.6 (H)  T4,Free(Direct)     0.60 - 1.60 ng/dL 1.61  Triiodothyronine,Free,Serum     2.3 - 4.2 pg/mL 3.5  Thyroglobulin Ab     <2  IU/mL <1  Thyroperoxidase Ab SerPl-aCnc     <9 IU/mL 1   HbA1c is still in the diabetic range, but it is better, and at goal, so for now, we can just monitor. I will advise her to check sugars once a day or once every other day and start writing them sugars down and bring them at next visit. TFTs and thyroid antibodies are normal.  Carlus Pavlov, MD PhD [facility]

## 2016-02-22 NOTE — Patient Instructions (Signed)
Please stop at the lab.  Please come back for a follow-up appointment in 6 months.  

## 2016-02-23 LAB — THYROGLOBULIN ANTIBODY: Thyroglobulin Ab: <1 IU/mL (ref <2)

## 2016-02-23 LAB — THYROID PEROXIDASE ANTIBODY: Thyroperoxidase Ab SerPl-aCnc: 1 IU/mL (ref <9)

## 2016-02-25 ENCOUNTER — Telehealth: Payer: Self-pay | Admitting: *Deleted

## 2016-02-25 ENCOUNTER — Encounter: Payer: Self-pay | Admitting: Internal Medicine

## 2016-02-25 ENCOUNTER — Encounter: Payer: Self-pay | Admitting: *Deleted

## 2016-02-25 NOTE — Telephone Encounter (Signed)
Please notify patient that she still has a diagnosis of type 2 diabetes so this will stay. Looks like her diabetes is well controlled which is great news!  I did resolve the thyrotoxicosis and changed to a history of abnormal thyroid test. These diagnoses are part of her medical history since she's had a personal history. It's important to keep this updated even if she's not having a current problem.

## 2016-02-25 NOTE — Telephone Encounter (Signed)
Patient left a voicemail stating that there are some things on my chart that should not be. Patient stated that on 01/21/16 it shows diagnosis as type 2 diabetes and thyrotoxicosis. Patient stated that she saw Dr.Gherghe on 02/22/16 and test came back normal. Patient stated that she would like these two diagnosis taken off of her chart. Patient request a call back.

## 2016-02-25 NOTE — Telephone Encounter (Signed)
Spoken and notified patient of Kate's comments. Patient verbalized understanding. 

## 2016-03-26 ENCOUNTER — Telehealth: Payer: Self-pay | Admitting: Pulmonary Disease

## 2016-03-26 NOTE — Telephone Encounter (Signed)
lmtcb

## 2016-04-01 NOTE — Telephone Encounter (Signed)
Called and spoke to pt. 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 Pt states she received a bill for more than $80.   Kathlee Nations please advise if you are able to shed some light on issue. Thanks.

## 2016-04-09 NOTE — Telephone Encounter (Signed)
Kathlee Nations any updates on this matter with the lab test?  thanks

## 2016-04-23 NOTE — Telephone Encounter (Signed)
Liz please advise. Thanks  

## 2016-04-24 NOTE — Telephone Encounter (Signed)
Reviewed patient's chart, found a better diagnosis for the denied services, called [facility], they are re-filing these claims with the updated diagnosis.  Called patient to let her know the claim was being re-filed.

## 2016-06-12 ENCOUNTER — Other Ambulatory Visit: Payer: Self-pay | Admitting: Primary Care

## 2016-06-12 ENCOUNTER — Telehealth: Payer: Self-pay | Admitting: Pulmonary Disease

## 2016-06-12 DIAGNOSIS — I2782 Chronic pulmonary embolism: Secondary | ICD-10-CM

## 2016-06-12 DIAGNOSIS — I2602 Saddle embolus of pulmonary artery with acute cor pulmonale: Secondary | ICD-10-CM

## 2016-06-12 DIAGNOSIS — I2692 Saddle embolus of pulmonary artery without acute cor pulmonale: Secondary | ICD-10-CM

## 2016-06-12 NOTE — Telephone Encounter (Signed)
Ok to refill? Electronically refill request for apixaban (ELIQUIS) 5 MG TABS tablet. Medication have not been prescribed by And[facility]. Last seen on 02/12/2016

## 2016-06-12 NOTE — Telephone Encounter (Signed)
Pt returning call cn be reached @ [phone] ext [phone].[name]

## 2016-06-12 NOTE — Telephone Encounter (Signed)
Spoke with pt and informed her that we were not the last people to fill this rx. I informed her that it was DR. Elmahi. She stated she will reach out to his office for a refill. Nothing further is needed at this time

## 2016-06-12 NOTE — Telephone Encounter (Signed)
Review chart, looks like prescriber was Dr. Hartford Poli? Has she followed up with anyone regarding continued use of this medication since her PE and DVT?

## 2016-06-12 NOTE — Telephone Encounter (Signed)
lmomtcb x1 

## 2016-06-13 ENCOUNTER — Encounter: Payer: Self-pay | Admitting: Primary Care

## 2016-06-13 MED ORDER — APIXABAN 5 MG PO TABS: ORAL_TABLET | ORAL | 1 refills | Status: DC

## 2016-06-13 NOTE — Addendum Note (Signed)
Addended by: Pleas Koch on: 06/13/2016 04:57 PM   Modules accepted: Orders

## 2016-06-13 NOTE — Telephone Encounter (Signed)
Patient called back.  Dr Hartford Poli in the [facility] ED was the one who prescribed medication and was given 5 refills.Patient stated that she tried to contact Dr Hartford Poli but does not do refill since he is a ED provider.  She does have appointment with Pulmonology on 07/01/2016. I have asked her as well if she see a cardiologist and she does not have one. She stated tht she may need referral to one if need be.  Patient stated that no told her how long she supposed to be taking this medication. Patient is concern and does not want to be without because of the Afib.

## 2016-06-13 NOTE — Telephone Encounter (Signed)
Refills sent to pharmacy, attempted to call her for a few clarifying questions, without answer. Agree that she needs the medication refilled, I do not see a history of atrial fibrillation in her chart and just wanted clarification of this diagnosis.

## 2016-06-13 NOTE — Telephone Encounter (Signed)
Per, DPR, left detail message for patient to return my call.

## 2016-06-13 NOTE — Telephone Encounter (Signed)
Pt left v/m requesting cb about eliquis refill and now pt is out of med.

## 2016-06-16 NOTE — Telephone Encounter (Signed)
[name] have addressed this with patient on 06/13/2016

## 2016-06-18 ENCOUNTER — Encounter: Payer: Self-pay | Admitting: Primary Care

## 2016-06-18 ENCOUNTER — Telehealth: Payer: Self-pay | Admitting: Primary Care

## 2016-06-18 ENCOUNTER — Ambulatory Visit (INDEPENDENT_AMBULATORY_CARE_PROVIDER_SITE_OTHER): Payer: No Typology Code available for payment source | Admitting: Primary Care

## 2016-06-18 VITALS — BP 130/82 | HR 82 | Temp 97.7°F | Ht 62.0 in | Wt 254.4 lb

## 2016-06-18 DIAGNOSIS — R110 Nausea: Secondary | ICD-10-CM

## 2016-06-18 DIAGNOSIS — I480 Paroxysmal atrial fibrillation: Secondary | ICD-10-CM

## 2016-06-18 MED ORDER — ONDANSETRON 4 MG PO TBDP: 4.0000 mg | ORAL_TABLET | Freq: Three times a day (TID) | ORAL | 0 refills | Status: DC | PRN

## 2016-06-18 NOTE — Telephone Encounter (Signed)
After further review of her chart, it looks like she's not taking her metoprolol succinate, is this the case? When did she stop taking this? Why?

## 2016-06-18 NOTE — Patient Instructions (Signed)
Your symptoms are representative of a viral illness which will resolve on its own over time. Our goal is to treat your symptoms in order to aid your body in the healing process and to make you more comfortable.   You may take the ondansetron 4 mg tablets for nausea. Melt 1 tablet into the mouth every 8 hours as needed for nausea.  Stop taking Imodium.   You must work on staying hydrated, advance your diet as tolerated. Take a look at the information below.  Please call me Friday if no improvement in your diarrhea, you start spiking fevers of 101, you start vomiting.  You will be contacted regarding your referral to Cardiology.  Please let us know if you have not heard back within one week.   It was a pleasure to see you today!   Food Choices to Help Relieve Diarrhea, Adult When you have diarrhea, the foods you eat and your eating habits are very important. Choosing the right foods and drinks can help:  Relieve diarrhea.  Replace lost fluids and nutrients.  Prevent dehydration. What general guidelines should I follow? Relieving diarrhea   Choose foods with less than 2 g or .07 oz. of fiber per serving.  Limit fats to less than 8 tsp (38 g or 1.34 oz.) a day.  Avoid the following:  Foods and beverages sweetened with high-fructose corn syrup, honey, or sugar alcohols such as xylitol, sorbitol, and mannitol.  Foods that contain a lot of fat or sugar.  Fried, greasy, or spicy foods.  High-fiber grains, breads, and cereals.  Raw fruits and vegetables.  Eat foods that are rich in probiotics. These foods include dairy products such as yogurt and fermented milk products. They help increase healthy bacteria in the stomach and intestines (gastrointestinal tract, or GI tract).  If you have lactose intolerance, avoid dairy products. These may make your diarrhea worse.  Take medicine to help stop diarrhea (antidiarrheal medicine) only as told by your health care provider. Replacing  nutrients   Eat small meals or snacks every 3-4 hours.  Eat bland foods, such as white rice, toast, or baked potato, until your diarrhea starts to get better. Gradually reintroduce nutrient-rich foods as tolerated or as told by your health care provider. This includes:  Well-cooked protein foods.  Peeled, seeded, and soft-cooked fruits and vegetables.  Low-fat dairy products.  Take vitamin and mineral supplements as told by your health care provider. Preventing dehydration    Start by sipping water or a special solution to prevent dehydration (oral rehydration solution, ORS). Urine that is clear or pale yellow means that you are getting enough fluid.  Try to drink at least 8-10 cups of fluid each day to help replace lost fluids.  You may add other liquids in addition to water, such as clear juice or decaffeinated sports drinks, as tolerated or as told by your health care provider.  Avoid drinks with caffeine, such as coffee, tea, or soft drinks.  Avoid alcohol. What foods are recommended? The items listed may not be a complete list. Talk with your health care provider about what dietary choices are best for you. Grains  White rice. White, [place], or pita breads (fresh or toasted), including plain rolls, buns, or bagels. White pasta. Saltine, soda, or graham crackers. Pretzels. Low-fiber cereal. Cooked cereals made with water (such as cornmeal, farina, or cream cereals). Plain muffins. Matzo. Melba toast. Zwieback. Vegetables  Potatoes (without the skin). Most well-cooked and canned vegetables without skins or  seeds. Tender lettuce. Fruits  Apple sauce. Fruits canned in juice. Cooked apricots, cherries, grapefruit, peaches, pears, or plums. Fresh bananas and cantaloupe. Meats and other protein foods  Baked or boiled chicken. Eggs. Tofu. Fish. Seafood. Smooth nut butters. Ground or well-cooked tender beef, ham, veal, lamb, pork, or poultry. Dairy  Plain yogurt, kefir, and  unsweetened liquid yogurt. Lactose-free milk, buttermilk, skim milk, or soy milk. Low-fat or nonfat hard cheese. Beverages  Water. Low-calorie sports drinks. Fruit juices without pulp. Strained tomato and vegetable juices. Decaffeinated teas. Sugar-free beverages not sweetened with sugar alcohols. Oral rehydration solutions, if approved by your health care provider. Seasoning and other foods  Bouillon, broth, or soups made from recommended foods. What foods are not recommended? The items listed may not be a complete list. Talk with your health care provider about what dietary choices are best for you. Grains  Whole grain, whole wheat, bran, or rye breads, rolls, pastas, and crackers. Wild or brown rice. Whole grain or bran cereals. Barley. Oats and oatmeal. Corn tortillas or taco shells. Granola. Popcorn. Vegetables  Raw vegetables. Fried vegetables. Cabbage, broccoli, Brussels sprouts, artichokes, baked beans, beet greens, corn, kale, legumes, peas, sweet potatoes, and yams. Potato skins. Cooked spinach and cabbage. Fruits  Dried fruit, including raisins and dates. Raw fruits. Stewed or dried prunes. Canned fruits with syrup. Meat and other protein foods  Fried or fatty meats. Deli meats. Chunky nut butters. Nuts and seeds. Beans and lentils. Berniece Salines. Hot dogs. Sausage. Dairy  High-fat cheeses. Whole milk, chocolate milk, and beverages made with milk, such as milk shakes. Half-and-half. Cream. sour cream. Ice cream. Beverages  Caffeinated beverages (such as coffee, tea, soda, or energy drinks). Alcoholic beverages. Fruit juices with pulp. Prune juice. Soft drinks sweetened with high-fructose corn syrup or sugar alcohols. High-calorie sports drinks. Fats and oils  Butter. Cream sauces. Margarine. Salad oils. Plain salad dressings. Olives. Avocados. Mayonnaise. Sweets and desserts  Sweet rolls, doughnuts, and sweet breads. Sugar-free desserts sweetened with sugar alcohols such as xylitol and  sorbitol. Seasoning and other foods  Honey. Hot sauce. Chili powder. Gravy. Cream-based or milk-based soups. Pancakes and waffles. Summary  When you have diarrhea, the foods you eat and your eating habits are very important.  Make sure you get at least 8-10 cups of fluid each day, or enough to keep your urine clear or pale yellow.  Eat bland foods and gradually reintroduce healthy, nutrient-rich foods as tolerated, or as told by your health care provider.  Avoid high-fiber, fried, greasy, or spicy foods. This information is not intended to replace advice given to you by your health care provider. Make sure you discuss any questions you have with your health care provider. Document Released: 06/28/2003 Document Revised: 04/04/2016 Document Reviewed: 04/04/2016 Elsevier Interactive Patient Education  2017 [employer].

## 2016-06-18 NOTE — Telephone Encounter (Signed)
Message left for patient to return my call.  

## 2016-06-18 NOTE — Assessment & Plan Note (Signed)
Per patient, originally diagnosed in Summer 2017 after PE diagnosis. Recent episode on 05/26/16 with RVR of 200's. Exam today with regular rate and rhythm.  Will have her continue eliquis for PE and also now atrial fibrillation. Continue Toprol XL. Referral placed to cardiology for further evaluation.

## 2016-06-18 NOTE — Progress Notes (Signed)
Pre visit review using our clinic review tool, if applicable. No additional management support is needed unless otherwise documented below in the visit note. 

## 2016-06-18 NOTE — Progress Notes (Signed)
Subjective:    Patient ID: [name], female    DOB: 1950/05/26, 66 y.o.   MRN: [id number]  HPI  Ms. Viscarra is a 66 year old female who presents today with a chief complaint of diarrhea. She also reports nausea, chills, and fatigue. Her symptoms initially began 1 week ago, lasted for 2 days, then stopped. Her symptoms began again 4 days ago and has been ongoing since. She denies any vomiting, bloody stools, fevers. She's taken Imodium since symptoms resumed. She has little appetite and is staying hydrated with water.   2) Atrial Fibrillation: Diagnosed in Summer 2017 after diagnosis of pulmonary embolus. Her last episode of atrial fibrillation occurred on February 5th. She called the paramedics who found her in atrial fibrillation with RVR in the low 200's which lasted for 15 minutes. Her symptoms resolved and she was not transported to the hospital. She's not had an episode since. She is due to see her pulmonologist next week. She does not have a cardiologist.   Review of Systems  Constitutional: Positive for chills and fatigue. Negative for fever.  Respiratory: Negative for shortness of breath.   Cardiovascular: Negative for chest pain and palpitations.  Gastrointestinal: Positive for diarrhea and nausea. Negative for abdominal pain, blood in stool, constipation and vomiting.  Neurological: Negative for dizziness.       Past Medical History:  Diagnosis Date  . Abnormal thyroid function test   . Anemia   . Barrett's esophagus   . GERD (gastroesophageal reflux disease)   . Hypothyroidism   . Murmur   . OA (osteoarthritis)   . Obese   . PE (pulmonary embolism) 12/21/2015  . Plantar fasciitis   . Type 2 diabetes mellitus (HCC)      Social History   Social History  . Marital status: Married    Spouse name: N/A  . Number of children: 3  . Years of education: N/A   Occupational History  .      Telephone sales   Social History Main Topics  . Smoking status: Never  Smoker  . Smokeless tobacco: Never Used  . Alcohol use No  . Drug use: No  . Sexual activity: Not on file   Other Topics Concern  . Not on file   Social History Narrative   Married.   3 children, 2 grandchildren.   Work's in a call center.   Enjoys reading, sewing, spending time with her grandchildren.    Past Surgical History:  Procedure Laterality Date  . ABDOMINAL HYSTERECTOMY    . Arthroscopic surgery left knee    . BACK SURGERY    . CESAREAN SECTION    . CHOLECYSTECTOMY    . IR GENERIC HISTORICAL  12/19/2015   IR ANGIOGRAM SELECTIVE EACH ADDITIONAL VESSEL 12/19/2015 [name], MD [facility]  . IR GENERIC HISTORICAL  12/19/2015   IR ANGIOGRAM PULMONARY BILATERAL SELECTIVE 12/19/2015 [name], MD [facility]  . IR GENERIC HISTORICAL  12/19/2015   IR INFUSION THROMBOL ARTERIAL INITIAL (MS) 12/19/2015 [name], MD [facility]  . IR GENERIC HISTORICAL  12/19/2015   IR US GUIDE VASC ACCESS RIGHT 12/19/2015 [name], MD [facility]  . IR GENERIC HISTORICAL  12/19/2015   IR ANGIOGRAM SELECTIVE EACH ADDITIONAL VESSEL 12/19/2015 [name], MD [facility]  . IR GENERIC HISTORICAL  12/20/2015   IR THROMB F/U EVAL ART/VEN FINAL DAY (MS) 12/20/2015 Gilmer Mor, DO [facility]  . IR GENERIC HISTORICAL  12/19/2015   IR INFUSION  THROMBOL ARTERIAL INITIAL (MS) 12/19/2015 [name], MD [facility]  . Right knee replacement    . SPINAL FUSION  10/31/2015   revision at [facility]   . TONSILLECTOMY      Family History  Problem Relation Age of Onset  . Stroke Maternal Grandmother   . Bladder Cancer Maternal Grandmother   . Hyperthyroidism Mother     Radioactive Iodine Treatment  . Hypothyroidism Mother   . Cervical cancer Maternal Aunt     Allergies  Allergen Reactions  . Ciprofloxacin Anaphylaxis  . Doxycycline Anaphylaxis  . Strawberry Extract Anaphylaxis  . Tetracyclines & Related     Current Outpatient Prescriptions on File Prior to  Visit  Medication Sig Dispense Refill  . apixaban (ELIQUIS) 5 MG TABS tablet Take 1 tablet by mouth twice daily. 180 tablet 1  . HYDROcodone-acetaminophen (NORCO/VICODIN) 5-325 MG tablet Take 1 tablet by mouth every 6 (six) hours as needed for moderate pain.    . pantoprazole (PROTONIX) 40 MG tablet Take 1 tablet (40 mg total) by mouth daily. 30 tablet 0  . metoprolol succinate (TOPROL-XL) 25 MG 24 hr tablet Take 1 tablet (25 mg total) by mouth daily. (Patient not taking: Reported on 02/22/2016) 30 tablet 0   No current facility-administered medications on file prior to visit.     BP 130/82   Pulse 82   Temp 97.7 F (36.5 C) (Oral)   Ht 5\' 2"  (1.575 m)   Wt 254 lb 6.4 oz (115.4 kg)   SpO2 97%   BMI 46.53 kg/m    Objective:   Physical Exam  Constitutional: She appears well-nourished.  Neck: Neck supple.  Cardiovascular: Normal rate, regular rhythm and normal heart sounds.   Pulmonary/Chest: Effort normal and breath sounds normal.  Abdominal: Soft. Bowel sounds are normal. There is no tenderness.  Skin: Skin is warm and dry.          Assessment & Plan:  Diarrhea:  Ongoing for 4 days, short episode last week.  Doing well to stay hydrated, little appetite due to nausea. Exam today stable, she does not appear acutely dehydrated or sickly. Suspect viral involvement and will treat with supportive measures. Stop Imodium. Rx for Zofran sent to pharmacy. Discussed importance of continued hydration and to advance diet as tolerated. Return precautions provided.  Morrie Sheldon, NP

## 2016-06-19 NOTE — Telephone Encounter (Signed)
Spoken to patient and she stated that she never took the medication. The reason she did not take it because family members (her mother's sisters) went to the hospital a few times due to heart rate in the low 40s.

## 2016-06-19 NOTE — Telephone Encounter (Signed)
If she's reticent to take this then she can speak with the cardiologist regarding her concerns. I think she should take it as it's a low dose and will prevent her HR from escalating like it did in early February.

## 2016-06-20 NOTE — Telephone Encounter (Signed)
Spoken to patient and she refuse to take medication. Patient stated could she take something else then?

## 2016-06-21 NOTE — Telephone Encounter (Signed)
She can discuss this with the cardiologist for recommendations as there are different options.

## 2016-06-23 NOTE — Telephone Encounter (Signed)
Noted. Patient is aware. 

## 2016-06-25 ENCOUNTER — Encounter: Payer: Self-pay | Admitting: Primary Care

## 2016-06-25 ENCOUNTER — Ambulatory Visit (INDEPENDENT_AMBULATORY_CARE_PROVIDER_SITE_OTHER): Payer: No Typology Code available for payment source | Admitting: Internal Medicine

## 2016-06-25 ENCOUNTER — Encounter: Payer: Self-pay | Admitting: Internal Medicine

## 2016-06-25 VITALS — BP 126/84 | HR 79 | Ht 62.0 in | Wt 256.6 lb

## 2016-06-25 DIAGNOSIS — I480 Paroxysmal atrial fibrillation: Secondary | ICD-10-CM

## 2016-06-25 DIAGNOSIS — I2602 Saddle embolus of pulmonary artery with acute cor pulmonale: Secondary | ICD-10-CM

## 2016-06-25 DIAGNOSIS — R002 Palpitations: Secondary | ICD-10-CM | POA: Diagnosis not present

## 2016-06-25 DIAGNOSIS — I2782 Chronic pulmonary embolism: Secondary | ICD-10-CM

## 2016-06-25 NOTE — Progress Notes (Signed)
Electrophysiology Office Note   Date:  06/25/2016   ID:  [name], DOB 02/22/1951, MRN [id number]  PCP:  Morrie Sheldon, NP   Primary Electrophysiologist: Hillis Range, MD    Chief Complaint  Patient presents with  . Atrial Fibrillation     History of Present Illness: [name] is a 66 y.o. female who presents today for electrophysiology evaluation.   The patient has paroxysmal atrial fibrillation.  Her first episode occurred 11/2015 in the setting of large bilateral PTE.  She reports that 05/26/16, she had abrupt onset of tachypalpitations.  She was evaluated by ENT and says that her heart rates were very elevated.  She had abrupt termination of the rhythm and therefore did not come to the hospital.  She has no afib documented on review of ekgs from [facility].  She is currently on eliquis. Currently, she has short "fluttering episodes" lasting 5-10 minutes.  These are abrupt in onset and offset.  Episodes are associated with profound presyncope.  She has not passed out.  Episodes currently occur every 3-4 days.  She finds that after eating she has a sensation of being "drained".  She also has similar symptoms if she stands quickly.  Today, she denies symptoms of chest pain, shortness of breath, orthopnea, PND, lower extremity edema, claudication, bleeding, or neurologic sequela. The patient is tolerating medications without difficulties and is otherwise without complaint today.    Past Medical History:  Diagnosis Date  . Abnormal thyroid function test   . Anemia    she attributes previously to fibroids, she declines  . Barrett's esophagus   . DDD (degenerative disc disease), cervical   . DJD (degenerative joint disease)   . GERD (gastroesophageal reflux disease)   . OA (osteoarthritis)   . Obese   . Paroxysmal atrial fibrillation (HCC)   . PE (pulmonary embolism) 12/21/2015  . Plantar fasciitis   . Type 2 diabetes mellitus (HCC)    diet controlled    Past Surgical History:  Procedure Laterality Date  . ABDOMINAL HYSTERECTOMY    . Arthroscopic surgery left knee    . BACK SURGERY    . CESAREAN SECTION    . CHOLECYSTECTOMY    . IR GENERIC HISTORICAL  12/19/2015   IR ANGIOGRAM SELECTIVE EACH ADDITIONAL VESSEL 12/19/2015 [name], MD [facility]  . IR GENERIC HISTORICAL  12/19/2015   IR ANGIOGRAM PULMONARY BILATERAL SELECTIVE 12/19/2015 [name], MD [facility]  . IR GENERIC HISTORICAL  12/19/2015   IR INFUSION THROMBOL ARTERIAL INITIAL (MS) 12/19/2015 [name], MD [facility]  . IR GENERIC HISTORICAL  12/19/2015   IR US GUIDE VASC ACCESS RIGHT 12/19/2015 [name], MD [facility]  . IR GENERIC HISTORICAL  12/19/2015   IR ANGIOGRAM SELECTIVE EACH ADDITIONAL VESSEL 12/19/2015 [name], MD [facility]  . IR GENERIC HISTORICAL  12/20/2015   IR THROMB F/U EVAL ART/VEN FINAL DAY (MS) 12/20/2015 Gilmer Mor, DO [facility]  . IR GENERIC HISTORICAL  12/19/2015   IR INFUSION THROMBOL ARTERIAL INITIAL (MS) 12/19/2015 [name], MD [facility]  . Right knee replacement    . SPINAL FUSION  10/31/2015   revision at [facility]   . TONSILLECTOMY    . VENOUS ABLATION     x 2     Current Outpatient Prescriptions  Medication Sig Dispense Refill  . apixaban (ELIQUIS) 5 MG TABS tablet Take 1 tablet by mouth twice daily. 180 tablet 1  . HYDROcodone-acetaminophen (NORCO/VICODIN) 5-325 MG  tablet Take 1 tablet by mouth every 6 (six) hours as needed for moderate pain.    Marland Kitchen ondansetron (ZOFRAN ODT) 4 MG disintegrating tablet Take 1 tablet (4 mg total) by mouth every 8 (eight) hours as needed for nausea or vomiting. 20 tablet 0  . pantoprazole (PROTONIX) 40 MG tablet Take 1 tablet (40 mg total) by mouth daily. 30 tablet 0  . metoprolol succinate (TOPROL-XL) 25 MG 24 hr tablet Take 1 tablet (25 mg total) by mouth daily. (Patient not taking: Reported on 02/22/2016) 30 tablet 0   No current facility-administered  medications for this visit.     Allergies:   Ciprofloxacin; Doxycycline; Strawberry extract; and Tetracyclines & related   Social History:  The patient  reports that she has never smoked. She has never used smokeless tobacco. She reports that she does not drink alcohol or use drugs.   Family History:  The patient's  family history includes Bladder Cancer in her maternal grandmother; Cervical cancer in her maternal aunt; Hyperthyroidism in her mother; Hypothyroidism in her mother; Stroke in her maternal grandmother.    ROS:  Please see the history of present illness.   All other systems are personally reviewed and negative.    PHYSICAL EXAM: VS:  BP 126/84   Pulse 79   Ht 5\' 2"  (1.575 m)   Wt 256 lb 9.6 oz (116.4 kg)   SpO2 97%   BMI 46.93 kg/m  , BMI Body mass index is 46.93 kg/m. GEN: overweight in no acute distress  HEENT: normal  Neck: no JVD, carotid bruits, or masses Cardiac: RRR; no murmurs, rubs, or gallops,no edema  Respiratory:  clear to auscultation bilaterally, normal work of breathing GI: soft, nontender, nondistended, + BS MS: no deformity or atrophy  Skin: warm and dry  Neuro:  Strength and sensation are intact Psych: euthymic mood, full affect  EKG:  EKG is ordered today. The ekg ordered today is personally reviewed and shows sinus rhythm 79 bpm, PR 154 msec, QRS 84 msec, Qtc 447 msec, poor R wave progression   Recent Labs: 12/19/2015: ALT 16; B Natriuretic Peptide 20.7 12/20/2015: BUN 12; Creatinine, Ser 0.79; Magnesium 2.0; Potassium 3.7; Sodium 140 12/22/2015: Hemoglobin 10.9; Platelets 133 02/22/2016: TSH 1.99  personally reviewed   Lipid Panel     Component Value Date/Time   CHOL 176 09/24/2015 0830   TRIG 121 09/24/2015 0830   HDL 43 09/24/2015 0830   CHOLHDL 4.1 09/24/2015 0830   LDLCALC 109 (H) 09/24/2015 0830   personally reviewed   Wt Readings from Last 3 Encounters:  06/25/16 256 lb 9.6 oz (116.4 kg)  06/18/16 254 lb 6.4 oz (115.4 kg)    02/22/16 260 lb (117.9 kg)      Other studies personally reviewed: Additional studies/ records that were reviewed today include: prior echo, records from primary care and from prior hospitalization  Review of the above records today demonstrates: EF 55%, moderate RV failure   ASSESSMENT AND PLAN:  1.  Paroxysmal atrial fibrillation The patient has symptomatic palpitations.  She is chronically anticoagulated with eliquis. She has frequent palpitations as well as episodes of presyncope.  Though these have been attributed to afib, I do not see afib documented.  All ekgs in epic reveal sinus.  At this time, I would favor 30 day monitor to better evaluate her palpitations/ presyncope and to evaluate afib burden. Further management pending results.  If she has no arrhythmias on her 30 day monitor, ILR placement may be helpful.  If she does have afib, AAD should be considered.  2. Prior PTE with RV failure Repeat echo Continue long term anticoagulation  3. Obesity Body mass index is 46.93 kg/m. Lifestyle modification including regular exercise and weight reduction were discussed at length today.  Follow-up with EP PA-C in 6 weeks  Current medicines are reviewed at length with the patient today.   The patient does not have concerns regarding her medicines.  The following changes were made today:  none  Signed, Hillis Range, MD  06/25/2016 12:09 PM     [facility] [address] [place] [address] [phone] (office) [phone] (fax)

## 2016-06-25 NOTE — Patient Instructions (Addendum)
Medication Instructions:  Your physician recommends that you continue on your current medications as directed. Please refer to the Current Medication list given to you today.    Labwork: None ordered   Testing/Procedures: Your physician has requested that you have an echocardiogram. Echocardiography is a painless test that uses sound waves to create images of your heart. It provides your doctor with information about the size and shape of your heart and how well your heart's chambers and valves are working. This procedure takes approximately one hour. There are no restrictions for this procedure.  Your physician has recommended that you wear an event monitor. Event monitors are medical devices that record the heart's electrical activity. Doctors most often [place] these monitors to diagnose arrhythmias. Arrhythmias are problems with the speed or rhythm of the heartbeat. The monitor is a small, portable device. You can wear one while you do your normal daily activities. This is usually used to diagnose what is causing palpitations/syncope (passing out).    Follow-Up:  Your physician recommends that you schedule a follow-up appointment in: 6 weeks with Tommye Standard, PA

## 2016-07-01 ENCOUNTER — Encounter: Payer: Self-pay | Admitting: Pulmonary Disease

## 2016-07-01 ENCOUNTER — Ambulatory Visit (INDEPENDENT_AMBULATORY_CARE_PROVIDER_SITE_OTHER): Payer: No Typology Code available for payment source | Admitting: Pulmonary Disease

## 2016-07-01 VITALS — BP 150/90 | HR 79 | Ht 62.0 in | Wt 256.4 lb

## 2016-07-01 DIAGNOSIS — I2609 Other pulmonary embolism with acute cor pulmonale: Secondary | ICD-10-CM

## 2016-07-01 NOTE — Patient Instructions (Signed)
   Call me if you have any new breathing problems or questions.   I will see you back in 3 months or sooner if needed.

## 2016-07-01 NOTE — Progress Notes (Signed)
Subjective:    Patient ID: [name], female    DOB: [birth year], 66 y.o.   MRN: [id number]  C.C.:  Follow-up for PE/DVT & Acute Cor Pulmonale.  HPI PE/DVT: Saddle pulmonary embolus with right lower extremity DVT found on imaging. Patient was treated with catheter directed thrombolytic therapy and discharged on oral anticoagulation with Eliquis. She has continued on her anticoagulation for her paroxysmal atrial fibrillation. She reports intermittent dyspnea. She does continue to have dyspnea on exertion.   Acute Cor Pulmonale: Patient was diagnosed at the time of her central pulmonary embolus. Seen on transthoracic echocardiogram. She is scheduled for a repeat echocardiogram by her cardiologist.   Review of Systems She reports intermittent palpitations. She denies any chest tightness or pressure. She does have some intermittent near syncope. No fever or chills. No hematochezia or melena. She does have intermittent abdominal pain that is improving. She does have intermittent bloating of her abdomen.   Allergies  Allergen Reactions  . Ciprofloxacin Anaphylaxis  . Doxycycline Anaphylaxis  . Strawberry Extract Anaphylaxis  . Tetracyclines & Related Anaphylaxis    Current Outpatient Prescriptions on File Prior to Visit  Medication Sig Dispense Refill  . apixaban (ELIQUIS) 5 MG TABS tablet Take 1 tablet by mouth twice daily. 180 tablet 1  . HYDROcodone-acetaminophen (NORCO/VICODIN) 5-325 MG tablet Take 1 tablet by mouth every 6 (six) hours as needed for moderate pain.    Marland Kitchen ondansetron (ZOFRAN ODT) 4 MG disintegrating tablet Take 1 tablet (4 mg total) by mouth every 8 (eight) hours as needed for nausea or vomiting. 20 tablet 0  . pantoprazole (PROTONIX) 40 MG tablet Take 1 tablet (40 mg total) by mouth daily. 30 tablet 0  . metoprolol succinate (TOPROL-XL) 25 MG 24 hr tablet Take 1 tablet (25 mg total) by mouth daily. (Patient not taking: Reported on 07/01/2016) 30 tablet 0   No current  facility-administered medications on file prior to visit.     Past Medical History:  Diagnosis Date  . Abnormal thyroid function test   . Anemia    she attributes previously to fibroids, she declines  . Barrett's esophagus   . DDD (degenerative disc disease), cervical   . DJD (degenerative joint disease)   . GERD (gastroesophageal reflux disease)   . OA (osteoarthritis)   . Obese   . Paroxysmal atrial fibrillation (HCC)   . PE (pulmonary embolism) 12/21/2015  . Plantar fasciitis   . Type 2 diabetes mellitus (HCC)    diet controlled    Past Surgical History:  Procedure Laterality Date  . ABDOMINAL HYSTERECTOMY    . Arthroscopic surgery left knee    . BACK SURGERY    . CESAREAN SECTION    . CHOLECYSTECTOMY    . IR GENERIC HISTORICAL  12/19/2015   IR ANGIOGRAM SELECTIVE EACH ADDITIONAL VESSEL 12/19/2015 [name], MD [facility]  . IR GENERIC HISTORICAL  12/19/2015   IR ANGIOGRAM PULMONARY BILATERAL SELECTIVE 12/19/2015 [name], MD [facility]  . IR GENERIC HISTORICAL  12/19/2015   IR INFUSION THROMBOL ARTERIAL INITIAL (MS) 12/19/2015 [name], MD [facility]  . IR GENERIC HISTORICAL  12/19/2015   IR US GUIDE VASC ACCESS RIGHT 12/19/2015 [name], MD [facility]  . IR GENERIC HISTORICAL  12/19/2015   IR ANGIOGRAM SELECTIVE EACH ADDITIONAL VESSEL 12/19/2015 [name], MD [facility]  . IR GENERIC HISTORICAL  12/20/2015   IR THROMB F/U EVAL ART/VEN FINAL DAY (MS) 12/20/2015 Gilmer Mor, DO [facility]  . IR  GENERIC HISTORICAL  12/19/2015   IR INFUSION THROMBOL ARTERIAL INITIAL (MS) 12/19/2015 [name], MD [facility]  . Right knee replacement    . SPINAL FUSION  10/31/2015   revision at [facility]   . TONSILLECTOMY    . VENOUS ABLATION     x 2    Family History  Problem Relation Age of Onset  . Stroke Maternal Grandmother   . Bladder Cancer Maternal Grandmother   . Hyperthyroidism Mother     Radioactive Iodine Treatment  .  Hypothyroidism Mother   . Cervical cancer Maternal Aunt     Social History   Social History  . Marital status: Married    Spouse name: N/A  . Number of children: 3  . Years of education: N/A   Occupational History  .      Telephone sales   Social History Main Topics  . Smoking status: Never Smoker  . Smokeless tobacco: Never Used  . Alcohol use No  . Drug use: No  . Sexual activity: Not Asked   Other Topics Concern  . None   Social History Narrative   Married.   3 children, 2 grandchildren.   Work's in a call center.   Enjoys reading, sewing, spending time with her grandchildren.      Objective:   Physical Exam BP (!) 150/90 (BP Location: Right Arm, Patient Position: Sitting, Cuff Size: Large)   Pulse 79   Ht 5\' 2"  (1.575 m)   Wt 256 lb 6.4 oz (116.3 kg)   SpO2 99%   BMI 46.90 kg/m  General:  Awake. Alert. No distress.   Integument:  Warm & dry. No rash on exposed skin.  Extremities:  No cyanosis or clubbing.  HEENT:  Moist mucus membranes. No oral ulcers. No scleral injection or icterus.  Cardiovascular:  Regular rate and rhythm. No edema.   Pulmonary:  Good aeration & clear to auscultation bilaterally. Symmetric chest wall expansion. No accessory muscle use on room air. Abdomen: Soft. Normal bowel sounds. Protuberant.  Musculoskeletal:  Normal bulk and tone. No joint deformity or effusion appreciated.  IMAGING CTA CHEST 12/19/15 (previously reviewed by me): Acute PE with evidence of right heart strain with RV/LV ratio 2. Large area of consolidation in posterior right upper lobe as well as right lower lobe segments. No pleural effusion. VENOUS DUPLEX BILATERAL LOWER EXTREMITIES (per radiologist): Acute DVT and right peroneal vein with acute SVT and left saphenofemoral junction and greater saphenous vein.  CARDIAC TTE (12/20/15): LV normal in size with mild concentric hypertrophy. EF 60-65% with normal wall motion and grade 1 diastolic dysfunction. LA & RA normal  in size. RV moderately dilated with moderately reduced systolic function. No aortic stenosis or regurgitation. Aortic root normal in size. No mitral stenosis or regurgitation. No pulmonic stenosis or regurgitation. Trivial tricuspid regurgitation. No pericardial effusion.  LABS 01/02/16 Factor V Leiden: Negative  12/20/15 Prothrombin gene mutation: Negative ANA:  Negative     Assessment & Plan:  66 y.o. female with history of PE/DVT and acute cor pulmonale and August 2017PE/DVT:  Continuing on Eliquis for her paroxysmal atrial fibrillation. Patient has no signs of volume overload. Her previous acute cor pulmonale is likely due to her saddle pulmonary embolism. She has completed an appropriate course of treatment for her clots but remains on systemic and coagulation for underlying paroxysmal atrial fibrillation. I instructed the patient contact my office if she had any new breathing problems or questions before next appointment.  1.  PE/DVT:  No need for further anticoagulation at this point.  2. Acute Cor Pulmonale:  Already scheduled for repeat echocardiogram. 3. Follow-up:  Return to clinic in 3 months or sooner if needed.   Donna Christen Jamison Neighbor, M.D. [facility] Pager:  [phone] After 3pm or if no response, call [phone] 2:05 PM 07/01/16

## 2016-07-02 ENCOUNTER — Ambulatory Visit (INDEPENDENT_AMBULATORY_CARE_PROVIDER_SITE_OTHER): Payer: No Typology Code available for payment source

## 2016-07-02 DIAGNOSIS — R002 Palpitations: Secondary | ICD-10-CM | POA: Diagnosis not present

## 2016-07-15 ENCOUNTER — Ambulatory Visit (HOSPITAL_COMMUNITY): Payer: No Typology Code available for payment source | Attending: Cardiology

## 2016-07-15 ENCOUNTER — Other Ambulatory Visit: Payer: Self-pay

## 2016-07-15 DIAGNOSIS — I517 Cardiomegaly: Secondary | ICD-10-CM | POA: Diagnosis not present

## 2016-07-15 DIAGNOSIS — E119 Type 2 diabetes mellitus without complications: Secondary | ICD-10-CM | POA: Insufficient documentation

## 2016-07-15 DIAGNOSIS — Z6841 Body Mass Index (BMI) 40.0 and over, adult: Secondary | ICD-10-CM | POA: Diagnosis not present

## 2016-07-15 DIAGNOSIS — E669 Obesity, unspecified: Secondary | ICD-10-CM | POA: Diagnosis not present

## 2016-07-15 DIAGNOSIS — R002 Palpitations: Secondary | ICD-10-CM

## 2016-07-15 MED ORDER — PERFLUTREN LIPID MICROSPHERE
1.0000 mL | INTRAVENOUS | Status: AC | PRN
  Administered 2016-07-15: 2 mL via INTRAVENOUS

## 2016-07-28 ENCOUNTER — Encounter: Payer: Self-pay | Admitting: Physician Assistant

## 2016-08-11 NOTE — Progress Notes (Signed)
Cardiology Office Note Date:  08/13/2016  Patient ID:  Destiny, [name] July 09, 1950, MRN [id number] PCP:  Morrie Sheldon, NP  Cardiologist:  Dr. Jens Som (last 2012) Electrophysiologist: Dr. Johney Frame    Chief Complaint: planned f/u from last visit  History of Present Illness: [name] is a 66 y.o. 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 Johney Frame 06/25/16 for her AF, symptoms of palpitations and pre-syncope with her AF episodes, he noted that all her EKGs in Epic were reviewed and found no evidence of AF, recommended a 30 day EM to start.  If no arrhythmias were caught felt implant of an ILR maybe helpful, if she did have AF on her monitor, consideration for AAD tx.  She comes today to be seen for Dr. Johney Frame, she is accompanied by her husband.  She had several EM symptom episodes.  CP, palpitations and dizziness.  She states they tend to occur together, the palpitations and dizziness, are always related.  The CP is described as a tight gripping type feeling, somewhat fleeting usually seconds.  The palpitations longer lasting and a racing sensation like her heart is beating out of her chest, not a sensation of skipped or extra beats and make her feel lightheaded.  She has not had near syncope or syncope.  She reports these symptoms have occurred at rest or up and about, no particular trigger.  She says she saw her pulmonologist after her visit with Dr. Johney Frame, only a week or 2 ago and he was very happy with her pulmonary recovery, and in discussion about her symptoms she reports he did not feel they had anything to do at this point with her prior PE or lungs.  She is not taking the metoprolol because she knows someone who was on the same medicine and ended up in the hospital with their HR too slow.  We discussed alternatives to metoprolol but she would rather not be on anything at this point.  She feels like she is  having numerous side effects from the Eliquis.  All of her current reported symptoms as well as car sickness.  I discuss and offer alternatives to this medicine as well but she tells me she has been on line in the the way of forums with other people on the medicine and have seen terrible commercials about the other medicines and at lest for now says Eliquis seems to be the least offensive to people and prefers to stay on it.  She has not had any kind of bleeding or signs of bleeding out side if a small amount I=on the tissue once in a while when she bows her nose   AFib Hx: 1st diagnosed Aug 2017 in setting of b/l PE Reports of palpitations that go back to Jan 2018 that were self limiting   Past Medical History:  Diagnosis Date  . Abnormal thyroid function test   . Anemia    she attributes previously to fibroids, she declines  . Barrett's esophagus   . DDD (degenerative disc disease), cervical   . DJD (degenerative joint disease)   . GERD (gastroesophageal reflux disease)   . OA (osteoarthritis)   . Obese   . Paroxysmal atrial fibrillation (HCC)   . PE (pulmonary embolism) 12/21/2015  . Plantar fasciitis   . Type 2 diabetes mellitus (HCC)    diet controlled    Past Surgical History:  Procedure Laterality Date  .  ABDOMINAL HYSTERECTOMY    . Arthroscopic surgery left knee    . BACK SURGERY    . CESAREAN SECTION    . CHOLECYSTECTOMY    . IR GENERIC HISTORICAL  12/19/2015   IR ANGIOGRAM SELECTIVE EACH ADDITIONAL VESSEL 12/19/2015 [name], MD [facility]  . IR GENERIC HISTORICAL  12/19/2015   IR ANGIOGRAM PULMONARY BILATERAL SELECTIVE 12/19/2015 [name], MD [facility]  . IR GENERIC HISTORICAL  12/19/2015   IR INFUSION THROMBOL ARTERIAL INITIAL (MS) 12/19/2015 [name], MD [facility]  . IR GENERIC HISTORICAL  12/19/2015   IR US GUIDE VASC ACCESS RIGHT 12/19/2015 [name], MD [facility]  . IR GENERIC HISTORICAL  12/19/2015   IR ANGIOGRAM SELECTIVE EACH  ADDITIONAL VESSEL 12/19/2015 [name], MD [facility]  . IR GENERIC HISTORICAL  12/20/2015   IR THROMB F/U EVAL ART/VEN FINAL DAY (MS) 12/20/2015 Gilmer Mor, DO [facility]  . IR GENERIC HISTORICAL  12/19/2015   IR INFUSION THROMBOL ARTERIAL INITIAL (MS) 12/19/2015 [name], MD [facility]  . Right knee replacement    . SPINAL FUSION  10/31/2015   revision at [facility]   . TONSILLECTOMY    . VENOUS ABLATION     x 2    Current Outpatient Prescriptions  Medication Sig Dispense Refill  . apixaban (ELIQUIS) 5 MG TABS tablet Take 1 tablet by mouth twice daily. 180 tablet 1  . Cholecalciferol (VITAMIN D-3) 1000 units CAPS Take 1 capsule by mouth daily.    [name] HYDROcodone-acetaminophen (NORCO/VICODIN) 5-325 MG tablet Take 1 tablet by mouth every 6 (six) hours as needed for moderate pain.    . metoprolol succinate (TOPROL-XL) 25 MG 24 hr tablet Take 1 tablet (25 mg total) by mouth daily. 30 tablet 0  . ondansetron (ZOFRAN ODT) 4 MG disintegrating tablet Take 1 tablet (4 mg total) by mouth every 8 (eight) hours as needed for nausea or vomiting. 20 tablet 0  . pantoprazole (PROTONIX) 40 MG tablet Take 1 tablet (40 mg total) by mouth daily. 30 tablet 0   No current facility-administered medications for this visit.     Allergies:   Ciprofloxacin; Doxycycline; Strawberry extract; and Tetracyclines & related   Social History:  The patient  reports that she has never smoked. She has never used smokeless tobacco. She reports that she does not drink alcohol or use drugs.   Family History:  The patient's family history includes Bladder Cancer in her maternal grandmother; Cervical cancer in her maternal aunt; Hyperthyroidism in her mother; Hypothyroidism in her mother; Stroke in her maternal grandmother.  ROS:  Please see the history of present illness.    All other systems are reviewed and otherwise negative.   PHYSICAL EXAM:  VS:  BP 128/76   Pulse 72   Ht 5\' 2"  (1.575 m)    Wt 257 lb (116.6 kg)   BMI 47.01 kg/m  BMI: Body mass index is 47.01 kg/m. Well nourished, well developed, obese, in no acute distress  HEENT: normocephalic, atraumatic  Neck: no JVD, carotid bruits or masses Cardiac:  RRR; no significant murmurs, no rubs, or gallops Lungs:  CTA b/l, no wheezing, rhonchi or rales  Abd: soft, nontender MS: no deformity or atrophy Ext: no edema  Skin: warm and dry, no rash Neuro:  No gross deficits appreciated Psych: euthymic mood, full affect   07/15/16 TTE Study Conclusions - Left ventricle: The cavity size was normal. Wall thickness was   normal. Systolic function was normal. The  estimated ejection   fraction was in the range of 55% to 60%. Wall motion was normal;   there were no regional wall motion abnormalities. Doppler   parameters are consistent with abnormal left ventricular   relaxation (grade 1 diastolic dysfunction). - Aortic valve: There was no stenosis. - Mitral valve: There was no significant regurgitation. - Right ventricle: The cavity size was mildly dilated. Systolic   function was normal. - Tricuspid valve: Peak RV-RA gradient (S): 19 mm Hg. - Pulmonary arteries: PA peak pressure: 22 mm Hg (S). - Inferior vena cava: The vessel was normal in size. The   respirophasic diameter changes were in the normal range (>= 50%),   consistent with normal central venous pressure. Impressions: - Normal LV size with EF 55-60%. Mildly dilated RV with normal   systolic function. No significant valvular abnormalities.  Recent Labs: 12/19/2015: ALT 16; B Natriuretic Peptide 20.7 12/20/2015: BUN 12; Creatinine, Ser 0.79; Magnesium 2.0; Potassium 3.7; Sodium 140 12/22/2015: Hemoglobin 10.9; Platelets 133 02/22/2016: TSH 1.99  09/24/2015: Chol/HDL Ratio 4.1; Cholesterol, Total 176; HDL 43; LDL Calculated 109; Triglycerides 121   CrCl cannot be calculated (Patient's most recent lab result is older than the maximum 21 days allowed.).   Wt Readings from  Last 3 Encounters:  08/13/16 257 lb (116.6 kg)  07/01/16 256 lb 6.4 oz (116.3 kg)  06/25/16 256 lb 9.6 oz (116.4 kg)     Other studies reviewed: Additional studies/records reviewed today include: summarized above  ASSESSMENT AND PLAN:  1. Hx of paroxysmal AFib     CHA2DS2Vasc is 3, on Eliquis     Dr. Johney Frame reviewed EKGs, no AF noted  I discussed that we could not fined evidence of AF by her EKG's, I have looked at her Epic CV strips as well.  Her EM has not been officially read but I looked through it and despite numerous symptoms episodes, no arrhythmias were noted, all were SR, some had PAC's PVC's only.  She had 0% AF burden, AVG HR was 74, she did have ST 120-130bpm We discussed taking the metoprolol or other similar medicine but she is reluctant and ultimately at this time unagreeable.  The patient tells me in Feb she had a terrible event and EMS came to her house found her HR 200's and stayed with her until it settled down and since she had felt better with slowing she did not transport.  She tells me that her PMD office was able to get the tracings and was AFib.  There is a noted by PMD's NP that refers to an episode of AF w/RVR found by EMS.  2. CP     Somewhat atypical though + risk     Recommend a stress test, she is agreeable only to an exercise stress test, not a chemical; stress test  We will start with the stress test.  She had several episodes while wearing the EM that did not correlate to arrhythmia, we discussed loop to monitor for AF, for now she will start with the stress test.  Pending the results for further, will see her back in 3 months sooner if needed.   I have cautioned the patient about using generic or unofficial on-line resources for medical information or to gain insight.  She tells me the blogs are a good support for her. She is urged to discuss with doctors/care providers her medical concerns/questions, or only refer to reputable resources such as the  [facility].   Signed, Sherrilee Gilles, PA-C  08/13/2016 10:17 AM     [facility] [address] [place] [address] [phone] (office)  [phone] (fax)

## 2016-08-13 ENCOUNTER — Ambulatory Visit (INDEPENDENT_AMBULATORY_CARE_PROVIDER_SITE_OTHER): Payer: No Typology Code available for payment source | Admitting: Physician Assistant

## 2016-08-13 VITALS — BP 128/76 | HR 72 | Ht 62.0 in | Wt 257.0 lb

## 2016-08-13 DIAGNOSIS — R079 Chest pain, unspecified: Secondary | ICD-10-CM

## 2016-08-13 DIAGNOSIS — I480 Paroxysmal atrial fibrillation: Secondary | ICD-10-CM | POA: Diagnosis not present

## 2016-08-13 LAB — BASIC METABOLIC PANEL
BUN/Creatinine Ratio: 16 (ref 12–28)
BUN: 12 mg/dL (ref 8–27)
CO2: 24 mmol/L (ref 18–29)
Calcium: 9.5 mg/dL (ref 8.7–10.3)
Chloride: 101 mmol/L (ref 96–106)
Creatinine, Ser: 0.76 mg/dL (ref 0.57–1.00)
GFR calc Af Amer: 95 mL/min/{1.73_m2} (ref >59)
GFR calc non Af Amer: 83 mL/min/{1.73_m2} (ref >59)
Glucose: 100 mg/dL — ABNORMAL HIGH (ref 65–99)
Potassium: 4.3 mmol/L (ref 3.5–5.2)
Sodium: 141 mmol/L (ref 134–144)

## 2016-08-13 LAB — CBC
Hematocrit: 39.4 % (ref 34.0–46.6)
Hemoglobin: 13.1 g/dL (ref 11.1–15.9)
MCH: 26.3 pg — ABNORMAL LOW (ref 26.6–33.0)
MCHC: 33.2 g/dL (ref 31.5–35.7)
MCV: 79 fL (ref 79–97)
Platelets: 252 10*3/uL (ref 150–379)
RBC: 4.99 x10E6/uL (ref 3.77–5.28)
RDW: 13.9 % (ref 12.3–15.4)
WBC: 6.5 10*3/uL (ref 3.4–10.8)

## 2016-08-13 NOTE — Patient Instructions (Addendum)
Medication Instructions:   Your physician recommends that you continue on your current medications as directed. Please refer to the Current Medication list given to you today.   If you need a refill on your cardiac medications before your next appointment, please call your pharmacy.  Labwork: BMET AND CBC TODAY    Testing/Procedures: Your physician has requested that you have en exercise stress myoview. For further information please visit [email or web address]. Please follow instruction sheet, as given.     Follow-Up: IN 2 TO 3 MONTHS WITH RENEE    Any Other Special Instructions Will Be Listed Below (If Applicable).

## 2016-08-14 ENCOUNTER — Telehealth (HOSPITAL_COMMUNITY): Payer: Self-pay | Admitting: *Deleted

## 2016-08-14 NOTE — Telephone Encounter (Signed)
Left message on voicemail per DPR in reference to upcoming appointment scheduled on 08/19/16 with detailed instructions given per Myocardial Perfusion Study Information Sheet for the test. LM to arrive 15 minutes early, and that it is imperative to arrive on time for appointment to keep from having the test rescheduled. If you need to cancel or reschedule your appointment, please call the office within 24 hours of your appointment. Failure to do so may result in a cancellation of your appointment, and a $50 no show fee. Phone number given for call back for any questions. [name]

## 2016-08-15 ENCOUNTER — Encounter: Payer: Self-pay | Admitting: Primary Care

## 2016-08-19 ENCOUNTER — Ambulatory Visit (HOSPITAL_COMMUNITY): Payer: No Typology Code available for payment source

## 2016-08-20 ENCOUNTER — Ambulatory Visit (HOSPITAL_COMMUNITY): Payer: No Typology Code available for payment source

## 2016-08-21 ENCOUNTER — Encounter: Payer: Self-pay | Admitting: Internal Medicine

## 2016-08-21 ENCOUNTER — Ambulatory Visit (INDEPENDENT_AMBULATORY_CARE_PROVIDER_SITE_OTHER): Payer: No Typology Code available for payment source | Admitting: Internal Medicine

## 2016-08-21 VITALS — BP 140/88 | HR 79 | Ht 62.0 in | Wt 257.0 lb

## 2016-08-21 DIAGNOSIS — E119 Type 2 diabetes mellitus without complications: Secondary | ICD-10-CM

## 2016-08-21 DIAGNOSIS — Z8349 Family history of other endocrine, nutritional and metabolic diseases: Secondary | ICD-10-CM | POA: Diagnosis not present

## 2016-08-21 LAB — POCT GLYCOSYLATED HEMOGLOBIN (HGB A1C): Hemoglobin A1C: 6.4

## 2016-08-21 NOTE — Patient Instructions (Signed)
Please come back for a follow-up appointment in 6 months.   

## 2016-08-21 NOTE — Progress Notes (Addendum)
Patient ID: [name], female   DOB: Jul 16, 1950, 66 y.o.   MRN: [id number]    HPI  [name] is a 66 y.o.-year-old female, returning for f/u for DM2, mild, non-insulin-dependent, without complicationas. I previously saw her also to r/o thyroid ds. 2/2 FH of hyperthyroidism. His husband is also my pt, [name]. Last visit 6 mo ago.  She saw cardiology >> wore an event monitor >> PAC and PVCs (no Afib) >> will have a stress test in 1 mo.  DM2: - Had 2 elevated HbA1c after steroids for back pain >> then started to decrease.  Last HbA1c: 05/23/2016: HbA1c 6.4% Lab Results  Component Value Date   HGBA1C 6.6 (H) 02/22/2016   HGBA1C 6.9 (H) 09/24/2015   Prev.: 5.9%  Diet: - Breakfast: Sausage biscuit - Lunch: Meat loaf, fish, or pasta - Dinner: Small - Snacks: 1 a day: String cheese or piece of candy, 100-calorie snack  Family history of thyroid disease:  I reviewed pt's thyroid tests: Lab Results  Component Value Date   TSH 1.99 02/22/2016   TSH 3.380 09/24/2015   FREET4 0.76 02/22/2016    Thyroid Ab's were normal: Component     Latest Ref Rng & Units 02/22/2016  Thyroglobulin Ab     <2 IU/mL <1  Thyroperoxidase Ab SerPl-aCnc     <9 IU/mL 1    Pt describes: - + weight gain and loss - 16 lbs since starting Eloquis In 11/2015 (believes she eliminated lymphedema water) - + fatigue, + insomnia - + cold intolerance - no depression - no constipation - + dry skin - + hair loss  Pt denies: - feeling nodules in neck - but has hoarseness - also has dysphagia (has Barrett's esophagus) - denies choking - denies SOB with lying down  Pt does have a FH of thyroid ds. In mother: hyperthyroidism when pregnant with the pt. No FH of thyroid cancer. No h/o radiation tx to head or neck.  No h/o radiation tx to head or neck. No use of iodine supplements.  Pt. was in the ED 12/19/2015 for a saddle PE. On Eloquis now. She saw pulmonology since then >> lung infarct  improving.  She also has a history of back surgery 10/31/2015. Previous back surgery in 2005. She had multiple courses of steroids, not recently.   ROS:- no changes since last visit: Constitutional: no weight gain, + nocturia Eyes: no blurry vision, no xerophthalmia ENT: no sore throat, + see history of present illness Cardiovascular: no CP/+ SOB/+ palpitations/+ leg swelling Respiratory: no cough/+ SOB Gastrointestinal: no N/V/D/C/+ heartburn Musculoskeletal: no muscle/+ joint aches Skin: no rashes Neurological: no tremors/numbness/tingling/dizziness  I reviewed pt's medications, allergies, PMH, social hx, family hx, and changes were documented in the history of present illness. Otherwise, unchanged from my initial visit note.  Past Medical History:  Diagnosis Date  . Abnormal thyroid function test   . Anemia    she attributes previously to fibroids, she declines  . Barrett's esophagus   . DDD (degenerative disc disease), cervical   . DJD (degenerative joint disease)   . GERD (gastroesophageal reflux disease)   . OA (osteoarthritis)   . Obese   . Paroxysmal atrial fibrillation (HCC)   . PE (pulmonary embolism) 12/21/2015  . Plantar fasciitis   . Type 2 diabetes mellitus (HCC)    diet controlled   Past Surgical History:  Procedure Laterality Date  . ABDOMINAL HYSTERECTOMY    . Arthroscopic surgery left knee    .  BACK SURGERY    . CESAREAN SECTION    . CHOLECYSTECTOMY    . IR GENERIC HISTORICAL  12/19/2015   IR ANGIOGRAM SELECTIVE EACH ADDITIONAL VESSEL 12/19/2015 [name], MD [facility]  . IR GENERIC HISTORICAL  12/19/2015   IR ANGIOGRAM PULMONARY BILATERAL SELECTIVE 12/19/2015 [name], MD [facility]  . IR GENERIC HISTORICAL  12/19/2015   IR INFUSION THROMBOL ARTERIAL INITIAL (MS) 12/19/2015 [name], MD [facility]  . IR GENERIC HISTORICAL  12/19/2015   IR US GUIDE VASC ACCESS RIGHT 12/19/2015 [name], MD [facility]  . IR GENERIC HISTORICAL   12/19/2015   IR ANGIOGRAM SELECTIVE EACH ADDITIONAL VESSEL 12/19/2015 [name], MD [facility]  . IR GENERIC HISTORICAL  12/20/2015   IR THROMB F/U EVAL ART/VEN FINAL DAY (MS) 12/20/2015 Gilmer Mor, DO [facility]  . IR GENERIC HISTORICAL  12/19/2015   IR INFUSION THROMBOL ARTERIAL INITIAL (MS) 12/19/2015 [name], MD [facility]  . Right knee replacement    . SPINAL FUSION  10/31/2015   revision at [facility]   . TONSILLECTOMY    . VENOUS ABLATION     x 2   Social History   Social History  . Marital status: Married    Spouse name: N/A  . Number of children: 3  . Years of education: N/A   Occupational History  .      Telephone sales   Social History Main Topics  . Smoking status: Never Smoker  . Smokeless tobacco: Never Used  . Alcohol use No  . Drug use: No  . Sexual activity: Not on file   Other Topics Concern  . Not on file   Social History Narrative   Married.   3 children, 2 grandchildren.   Work's in a call center.   Enjoys reading, sewing, spending time with her grandchildren.   Current Outpatient Prescriptions on File Prior to Visit  Medication Sig Dispense Refill  . apixaban (ELIQUIS) 5 MG TABS tablet Take 1 tablet by mouth twice daily. 180 tablet 1  . Cholecalciferol (VITAMIN D-3) 1000 units CAPS Take 1 capsule by mouth daily.    Marland Kitchen HYDROcodone-acetaminophen (NORCO/VICODIN) 5-325 MG tablet Take 1 tablet by mouth every 6 (six) hours as needed for moderate pain.    Marland Kitchen ondansetron (ZOFRAN ODT) 4 MG disintegrating tablet Take 1 tablet (4 mg total) by mouth every 8 (eight) hours as needed for nausea or vomiting. 20 tablet 0  . pantoprazole (PROTONIX) 40 MG tablet Take 1 tablet (40 mg total) by mouth daily. 30 tablet 0  . metoprolol succinate (TOPROL-XL) 25 MG 24 hr tablet Take 1 tablet (25 mg total) by mouth daily. (Patient not taking: Reported on 08/21/2016) 30 tablet 0   No current facility-administered medications on file prior to visit.     Allergies  Allergen Reactions  . Ciprofloxacin Anaphylaxis  . Doxycycline Anaphylaxis  . Strawberry Extract Anaphylaxis  . Tetracyclines & Related Anaphylaxis   Family History  Problem Relation Age of Onset  . Stroke Maternal Grandmother   . Bladder Cancer Maternal Grandmother   . Hyperthyroidism Mother     Radioactive Iodine Treatment  . Hypothyroidism Mother   . Cervical cancer Maternal Aunt     PE: BP 140/88 (BP Location: Left Arm, Patient Position: Sitting)   Pulse 79   Ht 5\' 2"  (1.575 m)   Wt 257 lb (116.6 kg)   SpO2 96%   BMI 47.01 kg/m  Wt Readings from Last 3 Encounters:  08/21/16 257 lb (116.6 kg)  08/13/16 257 lb (116.6 kg)  07/01/16 256 lb 6.4 oz (116.3 kg)   Constitutional: overweight, in NAD Eyes: PERRLA, EOMI, no exophthalmos ENT: moist mucous membranes, no thyromegaly, no cervical lymphadenopathy Cardiovascular: RRR, No MRG,  + lymphedema bilaterally Respiratory: CTA B Gastrointestinal: abdomen soft, NT, ND, BS+ Musculoskeletal: no deformities, strength intact in all 4 Skin: moist, warm, no rashes Neurological: no tremor with outstretched hands, DTR normal in all 4  ASSESSMENT: 1. Family history of Hypothyroidism  2. Mild, non-insulin diabetes type 2, w/o complication   PLAN:  1. Patient has a family history of hyperthyroidism in mother, however, at last visit, we ruled out thyroid ds in pt by normal TFTs and also normal thyroid Abs - she does not appear to have a goiter, thyroid nodules, or neck compression symptoms  2. Mild, non-insulin diabetes type 2, w/o complication - Patient With 2 HbA1c levels in the low diabetic range after treatment with steroids (multiple courses of prednisone for back pain before her back surgery in 10/2015), however, since then, her HbA1c improved to the prediabetic range - today: 6.4% (stable!) - discussed about the reversible nature of such mild diabetes - no other intervention needed - RTC in 6  months   Carlus Pavlov, MD PhD [facility] Endocrinology

## 2016-08-25 ENCOUNTER — Telehealth (HOSPITAL_COMMUNITY): Payer: Self-pay

## 2016-08-25 ENCOUNTER — Telehealth: Payer: Self-pay

## 2016-08-25 NOTE — Telephone Encounter (Signed)
Called to give pt cardiac monitor results. Left message with patients husband for the pt to call the office.

## 2016-08-25 NOTE — Telephone Encounter (Signed)
Patient given detailed instructions per Myocardial Perfusion Study Information Sheet for the test on 09/02/2016 at 12:45. Patient notified to arrive 15 minutes early and that it is imperative to arrive on time for appointment to keep from having the test rescheduled.  If you need to cancel or reschedule your appointment, please call the office within 24 hours of your appointment. Failure to do so may result in a cancellation of your appointment, and a $50 no show fee. Patient verbalized understanding.EHK

## 2016-09-02 ENCOUNTER — Ambulatory Visit (HOSPITAL_COMMUNITY): Payer: No Typology Code available for payment source

## 2016-09-02 ENCOUNTER — Ambulatory Visit (HOSPITAL_COMMUNITY): Payer: No Typology Code available for payment source | Attending: Cardiology

## 2016-09-02 DIAGNOSIS — R079 Chest pain, unspecified: Secondary | ICD-10-CM

## 2016-09-02 MED ORDER — REGADENOSON 0.4 MG/5ML IV SOLN
0.4000 mg | Freq: Once | INTRAVENOUS | Status: AC
  Administered 2016-09-02: 0.4 mg via INTRAVENOUS

## 2016-09-02 MED ORDER — TECHNETIUM TC 99M TETROFOSMIN IV KIT
32.6000 | PACK | Freq: Once | INTRAVENOUS | Status: AC | PRN
  Administered 2016-09-02: 32.6 via INTRAVENOUS
  Filled 2016-09-02: qty 33

## 2016-09-03 ENCOUNTER — Ambulatory Visit (HOSPITAL_COMMUNITY): Payer: No Typology Code available for payment source

## 2016-09-03 ENCOUNTER — Ambulatory Visit (HOSPITAL_COMMUNITY): Payer: No Typology Code available for payment source | Attending: Cardiovascular Disease

## 2016-09-03 LAB — MYOCARDIAL PERFUSION IMAGING
LV dias vol: 76 mL (ref 46–106)
LV sys vol: 25 mL
Peak HR: 116 {beats}/min
RATE: 0.43
Rest HR: 80 {beats}/min
SDS: 2
SRS: 9
SSS: 11
TID: 0.92

## 2016-09-03 MED ORDER — TECHNETIUM TC 99M TETROFOSMIN IV KIT
32.5000 | PACK | Freq: Once | INTRAVENOUS | Status: AC | PRN
  Administered 2016-09-03: 32.5 via INTRAVENOUS
  Filled 2016-09-03: qty 33

## 2016-09-25 ENCOUNTER — Ambulatory Visit: Payer: Self-pay | Admitting: *Deleted

## 2016-09-25 VITALS — BP 134/82 | Ht 62.0 in | Wt 257.0 lb

## 2016-09-25 DIAGNOSIS — E559 Vitamin D deficiency, unspecified: Secondary | ICD-10-CM

## 2016-09-25 DIAGNOSIS — Z0000 Encounter for general adult medical examination without abnormal findings: Secondary | ICD-10-CM

## 2016-09-25 NOTE — Progress Notes (Signed)
[employer] insurance premium discount evaluation: Labs Drawn. Replacements ROI form signed. Tobacco Free Attestation form signed.  Forms placed in paper chart.  

## 2016-09-26 LAB — CMP12+LP+TP+TSH+6AC+CBC/D/PLT
ALT: 12 IU/L (ref 0–32)
AST: 16 IU/L (ref 0–40)
Albumin/Globulin Ratio: 1.5 (ref 1.2–2.2)
Albumin: 4 g/dL (ref 3.6–4.8)
Alkaline Phosphatase: 77 IU/L (ref 39–117)
BUN/Creatinine Ratio: 20 (ref 12–28)
BUN: 14 mg/dL (ref 8–27)
Basophils Absolute: 0 10*3/uL (ref 0.0–0.2)
Basos: 1 %
Bilirubin Total: 0.7 mg/dL (ref 0.0–1.2)
Calcium: 9.2 mg/dL (ref 8.7–10.3)
Chloride: 105 mmol/L (ref 96–106)
Chol/HDL Ratio: 3.2 ratio (ref 0.0–4.4)
Cholesterol, Total: 159 mg/dL (ref 100–199)
Creatinine, Ser: 0.71 mg/dL (ref 0.57–1.00)
EOS (ABSOLUTE): 0.2 10*3/uL (ref 0.0–0.4)
Eos: 3 %
Estimated CHD Risk: <0.5 times avg. (ref 0.0–1.0)
Free Thyroxine Index: 2.1 (ref 1.2–4.9)
GFR calc Af Amer: 103 mL/min/{1.73_m2} (ref >59)
GFR calc non Af Amer: 90 mL/min/{1.73_m2} (ref >59)
GGT: 55 IU/L (ref 0–60)
Globulin, Total: 2.7 g/dL (ref 1.5–4.5)
Glucose: 106 mg/dL — ABNORMAL HIGH (ref 65–99)
HDL: 49 mg/dL (ref >39)
Hematocrit: 40 % (ref 34.0–46.6)
Hemoglobin: 13.1 g/dL (ref 11.1–15.9)
Immature Grans (Abs): 0 10*3/uL (ref 0.0–0.1)
Immature Granulocytes: 0 %
Iron: 52 ug/dL (ref 27–139)
LDH: 179 IU/L (ref 119–226)
LDL Calculated: 96 mg/dL (ref 0–99)
Lymphocytes Absolute: 1.4 10*3/uL (ref 0.7–3.1)
Lymphs: 28 %
MCH: 26.6 pg (ref 26.6–33.0)
MCHC: 32.8 g/dL (ref 31.5–35.7)
MCV: 81 fL (ref 79–97)
Monocytes Absolute: 0.3 10*3/uL (ref 0.1–0.9)
Monocytes: 6 %
Neutrophils Absolute: 3.1 10*3/uL (ref 1.4–7.0)
Neutrophils: 62 %
Phosphorus: 3.4 mg/dL (ref 2.5–4.5)
Platelets: 218 10*3/uL (ref 150–379)
Potassium: 4.7 mmol/L (ref 3.5–5.2)
RBC: 4.92 x10E6/uL (ref 3.77–5.28)
RDW: 14.9 % (ref 12.3–15.4)
Sodium: 144 mmol/L (ref 134–144)
T3 Uptake Ratio: 22 % — ABNORMAL LOW (ref 24–39)
T4, Total: 9.6 ug/dL (ref 4.5–12.0)
TSH: 2.47 u[IU]/mL (ref 0.450–4.500)
Total Protein: 6.7 g/dL (ref 6.0–8.5)
Triglycerides: 70 mg/dL (ref 0–149)
Uric Acid: 5.5 mg/dL (ref 2.5–7.1)
VLDL Cholesterol Cal: 14 mg/dL (ref 5–40)
WBC: 5 10*3/uL (ref 3.4–10.8)

## 2016-09-26 LAB — VITAMIN D 25 HYDROXY (VIT D DEFICIENCY, FRACTURES): Vit D, 25-Hydroxy: 24.2 ng/mL — ABNORMAL LOW (ref 30.0–100.0)

## 2016-09-26 LAB — HGB A1C W/O EAG: Hgb A1c MFr Bld: 6.3 % — ABNORMAL HIGH (ref 4.8–5.6)

## 2016-09-26 NOTE — Progress Notes (Signed)
Pt unable to be reached this afternoon during RN's hours. Message left on pt's personal voicemail detailing results and recommendations. Per [facility], pt accessed labs via MyChart this afternoon and viewed NP's notes already. Instructed ot to f/u with RN on Monday with any questions. Results routed to PCP per pt's request during original appt.

## 2016-10-06 ENCOUNTER — Encounter: Payer: Self-pay | Admitting: Internal Medicine

## 2016-10-06 ENCOUNTER — Encounter: Payer: Self-pay | Admitting: Primary Care

## 2016-10-07 NOTE — Progress Notes (Signed)
Subjective:    Patient ID: [name], female    DOB: [birth year], 66 y.o.   MRN: [id number]  C.C.:  Follow-up for PE/DVT & Acute Cor Pulmonale.  HPI PE/DVT: Patient previously treated for a saddle pulmonary embolism and right lower extremity DVT found on imaging with catheter directed thrombolytic therapy August 2017After undergoing revision of back surgery on 10/31/15. Patient was subsequently discharged on anticoagulation with Eliquis for chronic systemic anticoagulation secondary to underlying atrial fibrillation. Since last appointment patient was taken off of systemic anticoagulation by her primary care physician and transitioned to aspirin daily. She continues to have problems with dyspnea with exposure to excessive heat. She denies any coughing or wheezing.   Acute cor pulmonale: Diagnosed with the time of her central pulmonary embolism. Repeat echocardiogram showed mild dilation of the right ventricle but improved systolic function. She reports decreased edema in her legs. No syncope or near syncope.   Review of Systems She reports no palpitations, chest pain, or chest pressure. She reports no abdominal pain, distension, or nausea. She reports her previous myalgias improved. No joint swelling or erythema.   Allergies  Allergen Reactions  . Ciprofloxacin Anaphylaxis  . Doxycycline Anaphylaxis  . Strawberry Extract Anaphylaxis  . Tetracyclines & Related Anaphylaxis    Current Outpatient Prescriptions on File Prior to Visit  Medication Sig Dispense Refill  . Cholecalciferol (VITAMIN D-3) 1000 units CAPS Take 1 capsule by mouth daily.    Marland Kitchen HYDROcodone-acetaminophen (NORCO/VICODIN) 5-325 MG tablet Take 1 tablet by mouth every 6 (six) hours as needed for moderate pain.    Marland Kitchen ondansetron (ZOFRAN ODT) 4 MG disintegrating tablet Take 1 tablet (4 mg total) by mouth every 8 (eight) hours as needed for nausea or vomiting. 20 tablet 0  . pantoprazole (PROTONIX) 40 MG tablet Take 1 tablet  (40 mg total) by mouth daily. 30 tablet 0   No current facility-administered medications on file prior to visit.     Past Medical History:  Diagnosis Date  . Abnormal thyroid function test   . Anemia    she attributes previously to fibroids, she declines  . Barrett's esophagus   . DDD (degenerative disc disease), cervical   . DJD (degenerative joint disease)   . GERD (gastroesophageal reflux disease)   . OA (osteoarthritis)   . Obese   . Paroxysmal atrial fibrillation (HCC)   . PE (pulmonary embolism) 12/21/2015  . Plantar fasciitis   . Type 2 diabetes mellitus (HCC)    diet controlled    Past Surgical History:  Procedure Laterality Date  . ABDOMINAL HYSTERECTOMY    . Arthroscopic surgery left knee    . BACK SURGERY    . CESAREAN SECTION    . CHOLECYSTECTOMY    . IR GENERIC HISTORICAL  12/19/2015   IR ANGIOGRAM SELECTIVE EACH ADDITIONAL VESSEL 12/19/2015 [name], MD [facility]  . IR GENERIC HISTORICAL  12/19/2015   IR ANGIOGRAM PULMONARY BILATERAL SELECTIVE 12/19/2015 [name], MD [facility]  . IR GENERIC HISTORICAL  12/19/2015   IR INFUSION THROMBOL ARTERIAL INITIAL (MS) 12/19/2015 [name], MD [facility]  . IR GENERIC HISTORICAL  12/19/2015   IR US GUIDE VASC ACCESS RIGHT 12/19/2015 [name], MD [facility]  . IR GENERIC HISTORICAL  12/19/2015   IR ANGIOGRAM SELECTIVE EACH ADDITIONAL VESSEL 12/19/2015 [name], MD [facility]  . IR GENERIC HISTORICAL  12/20/2015   IR THROMB F/U EVAL ART/VEN FINAL DAY (MS) 12/20/2015 Gilmer Mor, DO [facility]  . IR  GENERIC HISTORICAL  12/19/2015   IR INFUSION THROMBOL ARTERIAL INITIAL (MS) 12/19/2015 [name], MD [facility]  . Right knee replacement    . SPINAL FUSION  10/31/2015   revision at [facility]   . TONSILLECTOMY    . VENOUS ABLATION     x 2    Family History  Problem Relation Age of Onset  . Stroke Maternal Grandmother   . Bladder Cancer Maternal Grandmother   .  Hyperthyroidism Mother        Radioactive Iodine Treatment  . Hypothyroidism Mother   . Cervical cancer Maternal Aunt     Social History   Social History  . Marital status: Married    Spouse name: N/A  . Number of children: 3  . Years of education: N/A   Occupational History  .      Telephone sales   Social History Main Topics  . Smoking status: Never Smoker  . Smokeless tobacco: Never Used  . Alcohol use No  . Drug use: No  . Sexual activity: Not Asked   Other Topics Concern  . None   Social History Narrative   Married.   3 children, 2 grandchildren.   Work's in a call center.   Enjoys reading, sewing, spending time with her grandchildren.      Objective:   Physical Exam BP 120/72 (BP Location: Left Arm, Cuff Size: Large)   Pulse 72   Ht 5\' 2"  (1.575 m)   Wt 257 lb 12.8 oz (116.9 kg)   SpO2 97%   BMI 47.15 kg/m   General:  Awake. Alert. No distress. Central obesity.  Integument:  Warm & dry. No rash on exposed skin.  Extremities:  No cyanosis or clubbing.  HEENT:  Moist mucus membranes. Minimal nasal turbinate swelling. No oral ulcers. Cardiovascular:  Regular rate and rhythm. No edema. Normal S1 & S2. Pulmonary:  Good aeration & clear to auscultation bilaterally. Symmetric chest wall expansion. No accessory muscle use on room air. Abdomen: Soft. Normal bowel sounds. Protuberant. Musculoskeletal:  Normal bulk and tone. No joint deformity or effusion appreciated.  IMAGING CTA CHEST 12/19/15 (previously reviewed by me): Acute PE with evidence of right heart strain with RV/LV ratio 2. Large area of consolidation in posterior right upper lobe as well as right lower lobe segments. No pleural effusion.  VENOUS DUPLEX BILATERAL LOWER EXTREMITIES (per radiologist): Acute DVT and right peroneal vein with acute SVT and left saphenofemoral junction and greater saphenous vein.  CARDIAC TTE (07/15/16):  LV normal in size with EF 55-60%. No regional wall motion  abnormalities & grade 1 diastolic dysfunction. LA & RA normal in size. RV mildly dilated with preserved systolic function. No aortic stenosis or regurgitation. Aortic root normal in size. No mitral stenosis or significant regurgitation. No pulmonic regurgitation. Trivial tricuspid regurgitation. No pericardial effusion.  TTE (12/20/15): LV normal in size with mild concentric hypertrophy. EF 60-65% with normal wall motion and grade 1 diastolic dysfunction. LA & RA normal in size. RV moderately dilated with moderately reduced systolic function. No aortic stenosis or regurgitation. Aortic root normal in size. No mitral stenosis or regurgitation. No pulmonic stenosis or regurgitation. Trivial tricuspid regurgitation. No pericardial effusion.  LABS 01/02/16 Factor V Leiden: Negative  12/20/15 Prothrombin gene mutation: Negative ANA:  Negative     Assessment & Plan:  66 y.o. female with history of PE/DVT and acute cor pulmonale in August 2017. Patient reporting significant side effects while on Eliquis. Given that her DVT/PE  was provoked by her preceding spinal surgery I feel that it is reasonable to consider remaining off systemic anticoagulation at this time as her treatment course was for nearly 9 months. We did discuss the risks and benefits of continued systemic anticoagulation as well as the risks of a recurrent DVT/PE. I suspect the patient's dyspnea with exposure to excessive heat is not secondary to an underlying pulmonary parenchymal or airway process. I instructed the patient denied of any if she developed any new symptoms or worsening in her dyspnea that would prompt me to pursue pulmonary function testing.  1. PE/DVT:  Status post over 6 months of treatment with systemic anticoagulation after lytic therapy. Currently on systemic anticoagulation. Patient educated regarding the risks of repeat DVT/PE. 2. Acute cor pulmonale: Essentially resolved. Secondary to acute pulmonary embolism.  3. Dyspnea:  Likely multifactorial. Low suspicion for underlying asthma/COPD. Deferring pulmonary function testing unless patient's symptoms worsen or she develops any new breathing problems.  4. Follow-up: Return to as needed.  Donna Christen Jamison Neighbor, M.D. [facility] Pager:  [phone] After 3pm or if no response, call [phone] 11:06 AM 10/08/16

## 2016-10-08 ENCOUNTER — Ambulatory Visit (INDEPENDENT_AMBULATORY_CARE_PROVIDER_SITE_OTHER): Payer: No Typology Code available for payment source | Admitting: Pulmonary Disease

## 2016-10-08 ENCOUNTER — Encounter: Payer: Self-pay | Admitting: Pulmonary Disease

## 2016-10-08 VITALS — BP 120/72 | HR 72 | Ht 62.0 in | Wt 257.8 lb

## 2016-10-08 DIAGNOSIS — I2609 Other pulmonary embolism with acute cor pulmonale: Secondary | ICD-10-CM

## 2016-10-08 DIAGNOSIS — R0600 Dyspnea, unspecified: Secondary | ICD-10-CM | POA: Diagnosis not present

## 2016-10-08 NOTE — Patient Instructions (Signed)
   Call me if you develop any cough or wheezing.  Let me know if you feel your shortness of breath is getting worse or becomes more frequent rather than just during times of high heat & humidity.  I will see you back as needed.

## 2016-10-15 ENCOUNTER — Encounter: Payer: Self-pay | Admitting: Primary Care

## 2016-10-15 DIAGNOSIS — Z1211 Encounter for screening for malignant neoplasm of colon: Secondary | ICD-10-CM

## 2016-10-15 DIAGNOSIS — K2270 Barrett's esophagus without dysplasia: Secondary | ICD-10-CM

## 2016-10-16 ENCOUNTER — Encounter: Payer: Self-pay | Admitting: Primary Care

## 2016-10-20 NOTE — Progress Notes (Signed)
Cardiology Office Note Date:  10/21/2016  Patient ID:  [name], [name] 08-22-1950, MRN [id number] PCP:  [name], NP  Cardiologist:  Dr. Jens Som (last 2012) Electrophysiologist: Dr. Johney Frame    Chief Complaint: planned f/u from last visit  History of Present Illness: [name] is a 66 y.o. 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 Johney Frame 06/25/16 for her AF, symptoms of palpitations and pre-syncope with her AF episodes, he noted that all her EKGs in Epic were reviewed and found no evidence of AF, recommended a 30 day EM to start.  If no arrhythmias were caught felt implant of an ILR maybe helpful, if she did have AF on her monitor, consideration for AAD tx.  She was last seen in our clinic by myself in April to review her EM results and symptoms.    She had several EM symptom episodes.  CP, palpitations and dizziness.  She states they tend to occur together, the palpitations and dizziness, are always related.  The CP is described as a tight gripping type feeling, somewhat fleeting usually seconds.  The palpitations longer lasting and a racing sensation like her heart is beating out of her chest, not a sensation of skipped or extra beats and make her feel lightheaded.  She has not had near syncope or syncope.  She reports these symptoms have occurred at rest or up and about, no particular trigger.  She says she saw her pulmonologist after her visit with Dr. Johney Frame, only a week or 2 ago and he was very happy with her pulmonary recovery, and in discussion about her symptoms she reports he did not feel they had anything to do at this point with her prior PE or lungs.  She was not taking the metoprolol because she knew someone who was on the same medicine and ended up in the hospital with their HR too slow.  We discussed alternatives to metoprolol but she would rather not be on anything at this point.  She feelt like  she was having numerous side effects from the Eliquis.  All of her current reported symptoms as well as car sickness.  I discuss and offer alternatives to this medicine as well but she tells me she has been on line in the the way of forums with other people on the medicine and have seen terrible commercials about the other medicines and at lest for now says Eliquis seems to be the least offensive to people and prefers to stay on it.  She has not had any kind of bleeding or signs of bleeding out side if a small amount on the tissue once in a while when she bows her nose  Since her last visit her Eliquis was stopped via primary care, pulmonary noted from DVT/PE perspective not unreasonable given event was provoked in prost-op setting. She reports feeling so much better off the a/c.  She feels a lot of her symptoms if not all were because of it.  She denies any kind of CP, no palpitations, no SOB, no dizziness, near syncope or syncope.   AFib Hx: 1st diagnosed Aug 2017 in setting of b/l PE Reports of palpitations that go back to Jan 2018 that were self limiting   Past Medical History:  Diagnosis Date  . Abnormal thyroid function test   . Anemia    she attributes previously to fibroids, she declines  . Barrett's  esophagus   . DDD (degenerative disc disease), cervical   . DJD (degenerative joint disease)   . GERD (gastroesophageal reflux disease)   . OA (osteoarthritis)   . Obese   . Paroxysmal atrial fibrillation (HCC)   . PE (pulmonary embolism) 12/21/2015  . Plantar fasciitis   . Type 2 diabetes mellitus (HCC)    diet controlled    Past Surgical History:  Procedure Laterality Date  . ABDOMINAL HYSTERECTOMY    . Arthroscopic surgery left knee    . BACK SURGERY    . CESAREAN SECTION    . CHOLECYSTECTOMY    . IR GENERIC HISTORICAL  12/19/2015   IR ANGIOGRAM SELECTIVE EACH ADDITIONAL VESSEL 12/19/2015 [name], MD [facility]  . IR GENERIC HISTORICAL  12/19/2015   IR ANGIOGRAM  PULMONARY BILATERAL SELECTIVE 12/19/2015 [name], MD [facility]  . IR GENERIC HISTORICAL  12/19/2015   IR INFUSION THROMBOL ARTERIAL INITIAL (MS) 12/19/2015 [name], MD [facility]  . IR GENERIC HISTORICAL  12/19/2015   IR US GUIDE VASC ACCESS RIGHT 12/19/2015 [name], MD [facility]  . IR GENERIC HISTORICAL  12/19/2015   IR ANGIOGRAM SELECTIVE EACH ADDITIONAL VESSEL 12/19/2015 [name], MD [facility]  . IR GENERIC HISTORICAL  12/20/2015   IR THROMB F/U EVAL ART/VEN FINAL DAY (MS) 12/20/2015 Gilmer Mor, DO [facility]  . IR GENERIC HISTORICAL  12/19/2015   IR INFUSION THROMBOL ARTERIAL INITIAL (MS) 12/19/2015 [name], MD [facility]  . Right knee replacement    . SPINAL FUSION  10/31/2015   revision at [facility]   . TONSILLECTOMY    . VENOUS ABLATION     x 2    Current Outpatient Prescriptions  Medication Sig Dispense Refill  . aspirin EC 81 MG tablet Take 81 mg by mouth daily.    . Cholecalciferol (VITAMIN D-3) 1000 units CAPS Take 1 capsule by mouth daily.    Marland Kitchen HYDROcodone-acetaminophen (NORCO/VICODIN) 5-325 MG tablet Take 1 tablet by mouth every 6 (six) hours as needed for moderate pain.    Marland Kitchen ondansetron (ZOFRAN ODT) 4 MG disintegrating tablet Take 1 tablet (4 mg total) by mouth every 8 (eight) hours as needed for nausea or vomiting. 20 tablet 0  . pantoprazole (PROTONIX) 40 MG tablet Take 1 tablet (40 mg total) by mouth daily. 30 tablet 0   No current facility-administered medications for this visit.     Allergies:   Ciprofloxacin; Doxycycline; Strawberry extract; and Tetracyclines & related   Social History:  The patient  reports that she has never smoked. She has never used smokeless tobacco. She reports that she does not drink alcohol or use drugs.   Family History:  The patient's family history includes Bladder Cancer in her maternal grandmother; Cervical cancer in her maternal aunt; Hyperthyroidism in her mother; Hypothyroidism in  her mother; Stroke in her maternal grandmother.  ROS:  Please see the history of present illness.    All other systems are reviewed and otherwise negative.   PHYSICAL EXAM:  VS:  BP (!) 142/76   Pulse 78   Ht 5\' 2"  (1.575 m)   Wt 257 lb (116.6 kg)   BMI 47.01 kg/m  BMI: Body mass index is 47.01 kg/m. Well nourished, well developed, obese, in no acute distress  HEENT: normocephalic, atraumatic  Neck: no JVD, carotid bruits or masses Cardiac:  RRR; no significant murmurs, no rubs, or gallops Lungs:  CTA b/l, no wheezing, rhonchi or rales  Abd: soft, nontender  MS: no deformity or atrophy Ext: no edema  Skin: warm and dry, no rash Neuro:  No gross deficits appreciated Psych: euthymic mood, full affect    09/03/16: Lexiscan stress test  Nuclear stress EF: 67%.  There was no ST segment deviation noted during stress.  This is a low risk study.  The left ventricular ejection fraction is hyperdynamic (>65%).   1. EF 67%, normal wall motion.  2. Fixed small, mild apical lateral perfusion defect. No ischemia.  Given normal wall motion, this appears more likely to represent attenuation than prior MI. Low risk study.   March 2018: 30 Day EM Sinus rhythm Rare premature atrial contractions and rare premature ventricular contractions No sustained arrhythmias No atrial fibrillation Symptomatic transmissions of "palpitations, fluttering, and dizziness" primarily correspond to sinus rhythm.  Rare episodes of "palpitations, fluttering" correspond to premature ventricular contractions  07/15/16 TTE Study Conclusions - Left ventricle: The cavity size was normal. Wall thickness was   normal. Systolic function was normal. The estimated ejection   fraction was in the range of 55% to 60%. Wall motion was normal;   there were no regional wall motion abnormalities. Doppler   parameters are consistent with abnormal left ventricular   relaxation (grade 1 diastolic dysfunction). - Aortic  valve: There was no stenosis. - Mitral valve: There was no significant regurgitation. - Right ventricle: The cavity size was mildly dilated. Systolic   function was normal. - Tricuspid valve: Peak RV-RA gradient (S): 19 mm Hg. - Pulmonary arteries: PA peak pressure: 22 mm Hg (S). - Inferior vena cava: The vessel was normal in size. The   respirophasic diameter changes were in the normal range (>= 50%),   consistent with normal central venous pressure. Impressions: - Normal LV size with EF 55-60%. Mildly dilated RV with normal   systolic function. No significant valvular abnormalities.  Recent Labs: 12/19/2015: B Natriuretic Peptide 20.7 12/20/2015: Magnesium 2.0 09/25/2016: ALT 12; BUN 14; Creatinine, Ser 0.71; Hemoglobin 13.1; Platelets 218; Potassium 4.7; Sodium 144; TSH 2.470  09/25/2016: Chol/HDL Ratio 3.2; Cholesterol, Total 159; HDL 49; LDL Calculated 96; Triglycerides 70   CrCl cannot be calculated (Patient's most recent lab result is older than the maximum 21 days allowed.).   Wt Readings from Last 3 Encounters:  10/21/16 257 lb (116.6 kg)  10/08/16 257 lb 12.8 oz (116.9 kg)  09/25/16 257 lb (116.6 kg)     Other studies reviewed: Additional studies/records reviewed today include: summarized above  ASSESSMENT AND PLAN:  1. Hx of paroxysmal AFib     CHA2DS2Vasc is 3, on Eliquis     Dr. Johney Frame reviewed EKGs, no AF was noted  I discussed that we could not find evidence of AF by her EKG's, I have looked at her Epic CV strips as well.  Her EM had no AF.  The patient told me in Feb she had a terrible event and EMS came to her house found her HR 200's and stayed with her until it settled down and since she had felt better with slowing she did not transport.  She tells me that her PMD office was able to get the tracings and was AFib.  There is a noted by PMD's NP that refers to an episode of AF w/RVR found by EMS.  We discussed at length AFib stroke risk given her risk score.  She  feels strongly the AF if in fact she had it was a product of the PE (and states her pulmonologist felt this was  also the case), she felt terrible on the a/c and uncomfortable with the other options.  So at this time she prefers to stay off a/c, should she developed any palpitations, symptoms to re-monitor re-visit this issue.  2. CP     Remains resolved     Negative stress testing  3. BP high here today     Her neighbor is a CNA and checks her BP for her usually 120's/70's     counseled on life style managament, she will follow with her PMD   Disposition:   She will f/u with PMD, we will plan to see her in 6 months to check in and/or PRN.   Judith Blonder, PA-C 10/21/2016 9:13 AM     [facility] [address] [place] [address] [phone] (office)  [phone] (fax)

## 2016-10-21 ENCOUNTER — Ambulatory Visit (INDEPENDENT_AMBULATORY_CARE_PROVIDER_SITE_OTHER): Payer: No Typology Code available for payment source | Admitting: Physician Assistant

## 2016-10-21 VITALS — BP 142/76 | HR 78 | Ht 62.0 in | Wt 257.0 lb

## 2016-10-21 DIAGNOSIS — R079 Chest pain, unspecified: Secondary | ICD-10-CM | POA: Diagnosis not present

## 2016-10-21 DIAGNOSIS — I480 Paroxysmal atrial fibrillation: Secondary | ICD-10-CM

## 2016-10-21 NOTE — Patient Instructions (Signed)
Medication Instructions:   Your physician recommends that you continue on your current medications as directed. Please refer to the Current Medication list given to you today.   If you need a refill on your cardiac medications before your next appointment, please call your pharmacy.  Labwork: NONE ORDERED  TODAY    Testing/Procedures: NONE ORDERED  TODAY    Follow-Up: Your physician wants you to follow-up in:  IN  6  MONTHS WITH URSUY You will receive a reminder letter in the mail two months in advance. If you don't receive a letter, please call our office to schedule the follow-up appointment.      Any Other Special Instructions Will Be Listed Below (If Applicable).                                                                                                                                                   

## 2016-11-09 ENCOUNTER — Encounter: Payer: Self-pay | Admitting: Primary Care

## 2016-11-10 NOTE — Telephone Encounter (Signed)
Rosaria Ferries, see My Chart message. Thanks!

## 2016-11-12 ENCOUNTER — Encounter: Payer: Self-pay | Admitting: Primary Care

## 2016-11-12 ENCOUNTER — Ambulatory Visit (INDEPENDENT_AMBULATORY_CARE_PROVIDER_SITE_OTHER): Payer: No Typology Code available for payment source | Admitting: Primary Care

## 2016-11-12 VITALS — BP 124/76 | HR 78 | Temp 98.0°F | Ht 62.0 in | Wt 257.4 lb

## 2016-11-12 DIAGNOSIS — J3089 Other allergic rhinitis: Secondary | ICD-10-CM

## 2016-11-12 MED ORDER — LEVOCETIRIZINE DIHYDROCHLORIDE 5 MG PO TABS: 5.0000 mg | ORAL_TABLET | Freq: Every evening | ORAL | 0 refills | Status: DC

## 2016-11-12 MED ORDER — FLUTICASONE PROPIONATE 50 MCG/ACT NA SUSP: 1.0000 | Freq: Two times a day (BID) | NASAL | 0 refills | Status: DC

## 2016-11-12 NOTE — Progress Notes (Signed)
Subjective:    Patient ID: [name], female    DOB: Apr 29, 1950, 66 y.o.   MRN: [id number]  HPI  [name] is a 66 year old female who presents today with a chief complaint of cough. She also reports sore throat, shortness of breath, ear pain post nasal drip. Her symptoms began 5 days ago. She's taken Sudafed without improvement. She has noticed a few fevers of 100-101 intermittently. She's not taken anything for her fevers.   Review of Systems  Constitutional: Positive for chills, fatigue and fever.  HENT: Positive for congestion and sore throat.   Respiratory: Positive for cough and shortness of breath.        Past Medical History:  Diagnosis Date  . Abnormal thyroid function test   . Anemia    she attributes previously to fibroids, she declines  . Barrett's esophagus   . DDD (degenerative disc disease), cervical   . DJD (degenerative joint disease)   . GERD (gastroesophageal reflux disease)   . OA (osteoarthritis)   . Obese   . Paroxysmal atrial fibrillation (HCC)   . PE (pulmonary embolism) 12/21/2015  . Plantar fasciitis   . Type 2 diabetes mellitus (HCC)    diet controlled     Social History   Social History  . Marital status: Married    Spouse name: N/A  . Number of children: 3  . Years of education: N/A   Occupational History  .      Telephone sales   Social History Main Topics  . Smoking status: Never Smoker  . Smokeless tobacco: Never Used  . Alcohol use No  . Drug use: No  . Sexual activity: Not on file   Other Topics Concern  . Not on file   Social History Narrative   Married.   3 children, 2 grandchildren.   Work's in a call center.   Enjoys reading, sewing, spending time with her grandchildren.    Past Surgical History:  Procedure Laterality Date  . ABDOMINAL HYSTERECTOMY    . Arthroscopic surgery left knee    . BACK SURGERY    . CESAREAN SECTION    . CHOLECYSTECTOMY    . IR GENERIC HISTORICAL  12/19/2015   IR ANGIOGRAM  SELECTIVE EACH ADDITIONAL VESSEL 12/19/2015 [name], MD [facility]  . IR GENERIC HISTORICAL  12/19/2015   IR ANGIOGRAM PULMONARY BILATERAL SELECTIVE 12/19/2015 [name], MD [facility]  . IR GENERIC HISTORICAL  12/19/2015   IR INFUSION THROMBOL ARTERIAL INITIAL (MS) 12/19/2015 [name], MD [facility]  . IR GENERIC HISTORICAL  12/19/2015   IR US GUIDE VASC ACCESS RIGHT 12/19/2015 [name], MD [facility]  . IR GENERIC HISTORICAL  12/19/2015   IR ANGIOGRAM SELECTIVE EACH ADDITIONAL VESSEL 12/19/2015 [name], MD [facility]  . IR GENERIC HISTORICAL  12/20/2015   IR THROMB F/U EVAL ART/VEN FINAL DAY (MS) 12/20/2015 Gilmer Mor, DO [facility]  . IR GENERIC HISTORICAL  12/19/2015   IR INFUSION THROMBOL ARTERIAL INITIAL (MS) 12/19/2015 [name], MD [facility]  . Right knee replacement    . SPINAL FUSION  10/31/2015   revision at [facility]   . TONSILLECTOMY    . VENOUS ABLATION     x 2    Family History  Problem Relation Age of Onset  . Stroke Maternal Grandmother   . Bladder Cancer Maternal Grandmother   . Hyperthyroidism Mother        Radioactive Iodine Treatment  .  Hypothyroidism Mother   . Cervical cancer Maternal Aunt     Allergies  Allergen Reactions  . Ciprofloxacin Anaphylaxis  . Doxycycline Anaphylaxis  . Strawberry Extract Anaphylaxis  . Tetracyclines & Related Anaphylaxis    Current Outpatient Prescriptions on File Prior to Visit  Medication Sig Dispense Refill  . aspirin EC 81 MG tablet Take 81 mg by mouth daily.    . Cholecalciferol (VITAMIN D-3) 1000 units CAPS Take 1 capsule by mouth daily.    Marland Kitchen HYDROcodone-acetaminophen (NORCO/VICODIN) 5-325 MG tablet Take 1 tablet by mouth every 6 (six) hours as needed for moderate pain.    . pantoprazole (PROTONIX) 40 MG tablet Take 1 tablet (40 mg total) by mouth daily. 30 tablet 0  . ondansetron (ZOFRAN ODT) 4 MG disintegrating tablet Take 1 tablet (4 mg total) by mouth every 8  (eight) hours as needed for nausea or vomiting. (Patient not taking: Reported on 11/12/2016) 20 tablet 0   No current facility-administered medications on file prior to visit.     BP 124/76   Pulse 78   Temp 98 F (36.7 C) (Oral)   Ht 5\' 2"  (1.575 m)   Wt 257 lb 6.4 oz (116.8 kg)   SpO2 98%   BMI 47.08 kg/m    Objective:   Physical Exam  Constitutional: She appears well-nourished. She does not appear ill.  HENT:  Right Ear: Tympanic membrane and ear canal normal.  Left Ear: Tympanic membrane and ear canal normal.  Nose: Right sinus exhibits no maxillary sinus tenderness and no frontal sinus tenderness. Left sinus exhibits no maxillary sinus tenderness and no frontal sinus tenderness.  Mouth/Throat: Oropharynx is clear and moist.  Eyes: Conjunctivae are normal.  Neck: Neck supple.  Cardiovascular: Normal rate and regular rhythm.   Pulmonary/Chest: Effort normal and breath sounds normal. She has no wheezes. She has no rales.  Lymphadenopathy:    She has no cervical adenopathy.  Skin: Skin is warm and dry.          Assessment & Plan:  URI:  Cough, post nasal drip, low grade fevers intermittently. No improvement with Sudafed. Exam today with clear lungs, doesn't appear acutely ill. Suspect viral and allergy involvement. Discussed use of Xyzal and Flonase daily, Rx's sent to pharmacy. Discussed Delsym or Robitussin PRN. Fluids, rest, follow up PRN.  Morrie Sheldon, NP

## 2016-11-12 NOTE — Patient Instructions (Addendum)
Your symptoms are representative of a viral illness which will resolve on its own over time. Our goal is to treat your symptoms in order to aid your body in the healing process and to make you more comfortable.   Start levocetirizine (Xyzal) 5 mg tablets for throat drainage, runny nose.  Nasal Congestion/Ear Pressure: Try using Flonase (fluticasone) nasal spray. Instill 1 spray in each nostril twice daily.   You can also try Delsym or Robitussin as needed for cough.  Please notify me if you develop persistent fevers of 101, start coughing up green mucous, notice increased fatigue or weakness, or feel worse after Monday next week.   Increase consumption of water intake and rest.  It was a pleasure to see you today!   Upper Respiratory Infection, Adult Most upper respiratory infections (URIs) are a viral infection of the air passages leading to the lungs. A URI affects the nose, throat, and upper air passages. The most common type of URI is nasopharyngitis and is typically referred to as "the common cold." URIs run their course and usually go away on their own. Most of the time, a URI does not require medical attention, but sometimes a bacterial infection in the upper airways can follow a viral infection. This is called a secondary infection. Sinus and middle ear infections are common types of secondary upper respiratory infections. Bacterial pneumonia can also complicate a URI. A URI can worsen asthma and chronic obstructive pulmonary disease (COPD). Sometimes, these complications can require emergency medical care and may be life threatening. What are the causes? Almost all URIs are caused by viruses. A virus is a type of germ and can spread from one person to another. What increases the risk? You may be at risk for a URI if:  You smoke.  You have chronic heart or lung disease.  You have a weakened defense (immune) system.  You are very young or very old.  You have nasal allergies or  asthma.  You work in crowded or poorly ventilated areas.  You work in health care facilities or schools.  What are the signs or symptoms? Symptoms typically develop 2-3 days after you come in contact with a cold virus. Most viral URIs last 7-10 days. However, viral URIs from the influenza virus (flu virus) can last 14-18 days and are typically more severe. Symptoms may include:  Runny or stuffy (congested) nose.  Sneezing.  Cough.  Sore throat.  Headache.  Fatigue.  Fever.  Loss of appetite.  Pain in your forehead, behind your eyes, and over your cheekbones (sinus pain).  Muscle aches.  How is this diagnosed? Your health care provider may diagnose a URI by:  Physical exam.  Tests to check that your symptoms are not due to another condition such as: ? Strep throat. ? Sinusitis. ? Pneumonia. ? Asthma.  How is this treated? A URI goes away on its own with time. It cannot be cured with medicines, but medicines may be prescribed or recommended to relieve symptoms. Medicines may help:  Reduce your fever.  Reduce your cough.  Relieve nasal congestion.  Follow these instructions at home:  Take medicines only as directed by your health care provider.  Gargle warm saltwater or take cough drops to comfort your throat as directed by your health care provider.  Use a warm mist humidifier or inhale steam from a shower to increase air moisture. This may make it easier to breathe.  Drink enough fluid to keep your urine clear or  pale yellow.  Eat soups and other clear broths and maintain good nutrition.  Rest as needed.  Return to work when your temperature has returned to normal or as your health care provider advises. You may need to stay home longer to avoid infecting others. You can also use a face mask and careful hand washing to prevent spread of the virus.  Increase the usage of your inhaler if you have asthma.  Do not use any tobacco products, including  cigarettes, chewing tobacco, or electronic cigarettes. If you need help quitting, ask your health care provider. How is this prevented? The best way to protect yourself from getting a cold is to practice good hygiene.  Avoid oral or hand contact with people with cold symptoms.  Wash your hands often if contact occurs.  There is no clear evidence that vitamin C, vitamin E, echinacea, or exercise reduces the chance of developing a cold. However, it is always recommended to get plenty of rest, exercise, and practice good nutrition. Contact a health care provider if:  You are getting worse rather than better.  Your symptoms are not controlled by medicine.  You have chills.  You have worsening shortness of breath.  You have brown or red mucus.  You have yellow or brown nasal discharge.  You have pain in your face, especially when you bend forward.  You have a fever.  You have swollen neck glands.  You have pain while swallowing.  You have white areas in the back of your throat. Get help right away if:  You have severe or persistent: ? Headache. ? Ear pain. ? Sinus pain. ? Chest pain.  You have chronic lung disease and any of the following: ? Wheezing. ? Prolonged cough. ? Coughing up blood. ? A change in your usual mucus.  You have a stiff neck.  You have changes in your: ? Vision. ? Hearing. ? Thinking. ? Mood. This information is not intended to replace advice given to you by your health care provider. Make sure you discuss any questions you have with your health care provider. Document Released: 10/01/2000 Document Revised: 12/09/2015 Document Reviewed: 07/13/2013 Elsevier Interactive Patient Education  2017 [employer].

## 2016-11-13 ENCOUNTER — Telehealth: Payer: Self-pay | Admitting: Internal Medicine

## 2016-11-13 NOTE — Telephone Encounter (Signed)
Her Primary care is referring her for another procedure. Would like to have all [employer]. Last Colon 2011 at [place]. Would like to have future procedure at [place]. Printed previous procedure and place on Dr Celesta Aver desk for review. He was the Doc of the day on 10-15-16 when patient was referred.

## 2016-11-17 ENCOUNTER — Encounter: Payer: Self-pay | Admitting: Internal Medicine

## 2016-11-18 ENCOUNTER — Encounter: Payer: Self-pay | Admitting: Internal Medicine

## 2016-11-18 NOTE — Telephone Encounter (Signed)
Spoke to patient and explained that according to current guidelines she is not due for another Colonoscopy until the year 2021. I have put in a reminder in Epic to reach out to her at that time. She is however due for an EGD due to Barrett's. That appointment was scheduled for 01-06-17 w/patient. She also wanted an office visit scheduled to meet and establish care. This has been scheduled for 01-21-17.

## 2016-11-20 ENCOUNTER — Ambulatory Visit: Payer: Self-pay | Admitting: Registered Nurse

## 2016-11-20 VITALS — BP 161/105 | HR 81 | Temp 97.8°F

## 2016-11-20 DIAGNOSIS — H6593 Unspecified nonsuppurative otitis media, bilateral: Secondary | ICD-10-CM

## 2016-11-20 DIAGNOSIS — J0111 Acute recurrent frontal sinusitis: Secondary | ICD-10-CM

## 2016-11-20 MED ORDER — AMOXICILLIN 875 MG PO TABS: 875.0000 mg | ORAL_TABLET | Freq: Two times a day (BID) | ORAL | 0 refills | Status: DC

## 2016-11-20 MED ORDER — MONTELUKAST SODIUM 10 MG PO TABS: 10.0000 mg | ORAL_TABLET | Freq: Every day | ORAL | 3 refills | Status: DC

## 2016-11-20 NOTE — Progress Notes (Signed)
Subjective:    Patient ID: [name], female    DOB: 04-03-1951, 66 y.o.   MRN: [id number]  66y/o caucasian female established patient c/o L ear pain, runny nose, post nasal drip, sore throat, nausea x1 week. Reports being seen by PCP on 7/25, given Xyzal, Flonase, no relief. Now with mild L ear drainage as well.  Showering before bed last sinus infection Jan 2018 treated with amoxicillin with good results.  Ragweed season usually flares her allergies  xyzal and flonase not helping some coughing nonproductive and wheezing with cough does not want steroids thinks she needs antibiotic for sinus infection and check if ear infection.      Review of Systems  Constitutional: Negative for activity change, appetite change, chills, diaphoresis, fatigue, fever and unexpected weight change.  HENT: Positive for congestion, ear discharge, ear pain, postnasal drip, rhinorrhea, sinus pain, sinus pressure, sore throat and voice change. Negative for dental problem, drooling, facial swelling, hearing loss, mouth sores, nosebleeds, sneezing, tinnitus and trouble swallowing.   Eyes: Negative for photophobia, pain, discharge, redness, itching and visual disturbance.  Respiratory: Positive for cough and wheezing. Negative for choking, chest tightness, shortness of breath and stridor.   Cardiovascular: Negative for chest pain, palpitations and leg swelling.  Gastrointestinal: Negative for abdominal distention, abdominal pain, blood in stool, constipation, diarrhea, nausea and vomiting.  Endocrine: Negative for cold intolerance and heat intolerance.  Genitourinary: Negative for difficulty urinating, dysuria and hematuria.  Musculoskeletal: Negative for arthralgias, back pain, gait problem, joint swelling, myalgias, neck pain and neck stiffness.  Skin: Negative for color change, pallor, rash and wound.  Allergic/Immunologic: Positive for environmental allergies and food allergies.  Neurological: Positive for  headaches. Negative for dizziness, tremors, seizures, syncope, facial asymmetry, speech difficulty, weakness, light-headedness and numbness.  Hematological: Negative for adenopathy. Does not bruise/bleed easily.  Psychiatric/Behavioral: Negative for agitation, behavioral problems, confusion and sleep disturbance.       Objective:   Physical Exam  Constitutional: She is oriented to person, place, and time. She appears well-developed and well-nourished. She is active and cooperative.  Non-toxic appearance. She does not have a sickly appearance. She appears ill. No distress.  HENT:  Head: Normocephalic and atraumatic.  Right Ear: Hearing, external ear and ear canal normal. A middle ear effusion is present.  Left Ear: Hearing, external ear and ear canal normal. A middle ear effusion is present.  Nose: Mucosal edema and rhinorrhea present. No nose lacerations, sinus tenderness, nasal deformity, septal deviation or nasal septal hematoma. No epistaxis.  No foreign bodies. Right sinus exhibits maxillary sinus tenderness and frontal sinus tenderness. Left sinus exhibits maxillary sinus tenderness and frontal sinus tenderness.  Mouth/Throat: Uvula is midline and mucous membranes are normal. Mucous membranes are not pale, not dry and not cyanotic. She does not have dentures. No oral lesions. No trismus in the jaw. Normal dentition. No dental abscesses, uvula swelling, lacerations or dental caries. Posterior oropharyngeal edema and posterior oropharyngeal erythema present. No oropharyngeal exudate or tonsillar abscesses.  Frontal greater than maxillary sinuses TTP bilaterally; left TM with cerumen adhered 75% from 3-12 oclock position clockwise used curettage and removed cerumen completely; cobblestoning posterior pharynx; bilateral allergic shiners; bilateral nasal turbinates edema/erythema clear discharge; hoarse voice  Eyes: Pupils are equal, round, and reactive to light. Conjunctivae, EOM and lids are  normal. Right eye exhibits no chemosis, no discharge, no exudate and no hordeolum. No foreign body present in the right eye. Left eye exhibits no chemosis, no discharge, no exudate  and no hordeolum. No foreign body present in the left eye. Right conjunctiva is not injected. Right conjunctiva has no hemorrhage. Left conjunctiva is not injected. Left conjunctiva has no hemorrhage. No scleral icterus. Right eye exhibits normal extraocular motion and no nystagmus. Left eye exhibits normal extraocular motion and no nystagmus. Right pupil is round and reactive. Left pupil is round and reactive. Pupils are equal.  Neck: Trachea normal, normal range of motion and phonation normal. Neck supple. No tracheal tenderness, no spinous process tenderness and no muscular tenderness present. No neck rigidity. No tracheal deviation, no edema, no erythema and normal range of motion present. No thyroid mass and no thyromegaly present.  Cardiovascular: Normal rate, regular rhythm, S1 normal, S2 normal, normal heart sounds and intact distal pulses.  PMI is not displaced.  Exam reveals no gallop and no friction rub.   No murmur heard. Pulmonary/Chest: Effort normal and breath sounds normal. No accessory muscle usage or stridor. No respiratory distress. She has no decreased breath sounds. She has no wheezes. She has no rhonchi. She has no rales. She exhibits no tenderness.  Speaks full sentences without difficulty cough not observed in exam room  Abdominal: Soft. She exhibits no distension.  Musculoskeletal: Normal range of motion. She exhibits no edema or tenderness.       Right shoulder: Normal.       Left shoulder: Normal.       Right hip: Normal.       Left hip: Normal.       Right knee: Normal.       Left knee: Normal.       Cervical back: Normal.       Right hand: Normal.       Left hand: Normal.  Lymphadenopathy:       Head (right side): No submental, no submandibular, no tonsillar, no preauricular, no posterior  auricular and no occipital adenopathy present.       Head (left side): No submental, no submandibular, no tonsillar, no preauricular, no posterior auricular and no occipital adenopathy present.    She has no cervical adenopathy.       Right cervical: No superficial cervical, no deep cervical and no posterior cervical adenopathy present.      Left cervical: No superficial cervical, no deep cervical and no posterior cervical adenopathy present.  Neurological: She is alert and oriented to person, place, and time. She has normal strength. She is not disoriented. She displays no atrophy and no tremor. No cranial nerve deficit or sensory deficit. She exhibits normal muscle tone. She displays no seizure activity. Coordination and gait normal. GCS eye subscore is 4. GCS verbal subscore is 5. GCS motor subscore is 6.  Bilateral hand grasp 5/5 equal; gait sure and steady with cane in hallway; in/out of chair without difficulty  Skin: Skin is warm, dry and intact. No abrasion, no bruising, no burn, no ecchymosis, no laceration, no lesion, no petechiae and no rash noted. She is not diaphoretic. No cyanosis or erythema. No pallor. Nails show no clubbing.  Psychiatric: She has a normal mood and affect. Her speech is normal and behavior is normal. Judgment and thought content normal. Cognition and memory are normal.  Nursing note and vitals reviewed.         Assessment & Plan:  A-frontal sinusitis recurrent; bilateral oititis media effusion, hypertension  P-singulair 10mg  po qhs #30 RF3 electronic Rx pharmacy of choice; amoxicillin 875mg  po BID x 10 days #20 RF0 dispensed from [facility].  1 bottle nasal saline 2 sprays each nostril q2h wa dispensed to patient from clinic stock.  Refused cough lozenges.    Sinus rinse bid with shower.  Shower prior to bedtime.   No evidence of systemic bacterial infection, non toxic and well hydrated.  I do not see where any further testing or imaging is necessary at this time.   I  will suggest supportive care, rest, good hygiene and encourage the patient to take adequate fluids.  The patient is to return to clinic or EMERGENCY ROOM if symptoms worsen or change significantly.  Exitcare handout on sinusitis and sinus rinse given to patient.  Patient verbalized agreement and understanding of treatment plan and had no further questions at this time.   P2:  Hand washing and cover cough   flonase 1 spray each nostril BID at home.  Patient may use normal saline nasal spray as needed.  Avoid triggers if possible.  Shower prior to bedtime if exposed to triggers.  If allergic dust/dust mites recommend mattress/pillow covers/encasements; washing linens, vacuuming, sweeping, dusting weekly.  Call or return to clinic as needed if these symptoms worsen or fail to improve as anticipated.   Exitcare handout on allergic rhinitis given to patient.  Patient verbalized understanding of instructions, agreed with plan of care and had no further questions at this time.  P2:  Avoidance and hand washing.    No evidence of invasive bacterial infection, non toxic and well hydrated.  This is most likely self limiting viral infection.  I do not see where any further testing or imaging is necessary at this time.   I will suggest supportive care, rest, good hygiene and encourage the patient to take adequate fluids.  The patient is to return to clinic or EMERGENCY ROOM if symptoms worsen or change significantly e.g. ear pain, fever, purulent discharge from ears or bleeding.  Exitcare handout on otitis media with effusion given to patient.  Patient verbalized agreement and understanding of treatment plan.

## 2016-11-20 NOTE — Patient Instructions (Signed)
Sinus Rinse What is a sinus rinse? A sinus rinse is a simple home treatment that is used to rinse your sinuses with a sterile mixture of salt and water (saline solution). Sinuses are air-filled spaces in your skull behind the bones of your face and forehead that open into your nasal cavity. You will use the following:  Saline solution.  Neti pot or spray bottle. This releases the saline solution into your nose and through your sinuses. Neti pots and spray bottles can be purchased at [employer], a health food store, or online.  When would I do a sinus rinse? A sinus rinse can help to clear mucus, dirt, dust, or pollen from the nasal cavity. You may do a sinus rinse when you have a cold, a virus, nasal allergy symptoms, a sinus infection, or stuffiness in the nose or sinuses. If you are considering a sinus rinse:  Ask your child's health care provider before performing a sinus rinse on your child.  Do not do a sinus rinse if you have had ear or nasal surgery, ear infection, or blocked ears.  How do I do a sinus rinse?  Wash your hands.  Disinfect your device according to the directions provided and then dry it.  Use the solution that comes with your device or one that is sold separately in stores. Follow the mixing directions on the package.  Fill your device with the amount of saline solution as directed by the device instructions.  Stand over a sink and tilt your head sideways over the sink.  Place the spout of the device in your upper nostril (the one closer to the ceiling).  Gently pour or squeeze the saline solution into the nasal cavity. The liquid should drain to the lower nostril if you are not overly congested.  Gently blow your nose. Blowing too hard may cause ear pain.  Repeat in the other nostril.  Clean and rinse your device with clean water and then air-dry it. Are there risks of a sinus rinse? Sinus rinse is generally very safe and effective. However,  there are a few risks, which include:  A burning sensation in the sinuses. This may happen if you do not make the saline solution as directed. Make sure to follow all directions when making the saline solution.  Infection from contaminated water. This is rare, but possible.  Nasal irritation.  This information is not intended to replace advice given to you by your health care provider. Make sure you discuss any questions you have with your health care provider. Document Released: 11/02/2013 Document Revised: 03/04/2016 Document Reviewed: 08/23/2013 Elsevier Interactive Patient Education  2017 Elsevier Inc. Sinusitis, Adult Sinusitis is soreness and inflammation of your sinuses. Sinuses are hollow spaces in the bones around your face. Your sinuses are located:  Around your eyes.  In the middle of your forehead.  Behind your nose.  In your cheekbones.  Your sinuses and nasal passages are lined with a stringy fluid (mucus). Mucus normally drains out of your sinuses. When your nasal tissues become inflamed or swollen, the mucus can become trapped or blocked so air cannot flow through your sinuses. This allows bacteria, viruses, and funguses to grow, which leads to infection. Sinusitis can develop quickly and last for 7?10 days (acute) or for more than 12 weeks (chronic). Sinusitis often develops after a cold. What are the causes? This condition is caused by anything that creates swelling in the sinuses or stops mucus from draining, including:  Allergies.  Asthma.  Bacterial or viral infection.  Abnormally shaped bones between the nasal passages.  Nasal growths that contain mucus (nasal polyps).  Narrow sinus openings.  Pollutants, such as chemicals or irritants in the air.  A foreign object stuck in the nose.  A fungal infection. This is rare.  What increases the risk? The following factors may make you more likely to develop this condition:  Having allergies or  asthma.  Having had a recent cold or respiratory tract infection.  Having structural deformities or blockages in your nose or sinuses.  Having a weak immune system.  Doing a lot of swimming or diving.  Overusing nasal sprays.  Smoking.  What are the signs or symptoms? The main symptoms of this condition are pain and a feeling of pressure around the affected sinuses. Other symptoms include:  Upper toothache.  Earache.  Headache.  Bad breath.  Decreased sense of smell and taste.  A cough that may get worse at night.  Fatigue.  Fever.  Thick drainage from your nose. The drainage is often green and it may contain pus (purulent).  Stuffy nose or congestion.  Postnasal drip. This is when extra mucus collects in the throat or back of the nose.  Swelling and warmth over the affected sinuses.  Sore throat.  Sensitivity to light.  How is this diagnosed? This condition is diagnosed based on symptoms, a medical history, and a physical exam. To find out if your condition is acute or chronic, your health care provider may:  Look in your nose for signs of nasal polyps.  Tap over the affected sinus to check for signs of infection.  View the inside of your sinuses using an imaging device that has a light attached (endoscope).  If your health care provider suspects that you have chronic sinusitis, you may also:  Be tested for allergies.  Have a sample of mucus taken from your nose (nasal culture) and checked for bacteria.  Have a mucus sample examined to see if your sinusitis is related to an allergy.  If your sinusitis does not respond to treatment and it lasts longer than 8 weeks, you may have an MRI or CT scan to check your sinuses. These scans also help to determine how severe your infection is. In rare cases, a bone biopsy may be done to rule out more serious types of fungal sinus disease. How is this treated? Treatment for sinusitis depends on the cause and  whether your condition is chronic or acute. If a virus is causing your sinusitis, your symptoms will go away on their own within 10 days. You may be given medicines to relieve your symptoms, including:  Topical nasal decongestants. They shrink swollen nasal passages and let mucus drain from your sinuses.  Antihistamines. These drugs block inflammation that is triggered by allergies. This can help to ease swelling in your nose and sinuses.  Topical nasal corticosteroids. These are nasal sprays that ease inflammation and swelling in your nose and sinuses.  Nasal saline washes. These rinses can help to get rid of thick mucus in your nose.  If your condition is caused by bacteria, you will be given an antibiotic medicine. If your condition is caused by a fungus, you will be given an antifungal medicine. Surgery may be needed to correct underlying conditions, such as narrow nasal passages. Surgery may also be needed to remove polyps. Follow these instructions at home: Medicines  Take, use, or apply over-the-counter and prescription medicines only as told by  your health care provider. These may include nasal sprays.  If you were prescribed an antibiotic medicine, take it as told by your health care provider. Do not stop taking the antibiotic even if you start to feel better. Hydrate and Humidify  Drink enough water to keep your urine clear or pale yellow. Staying hydrated will help to thin your mucus.  Use a cool mist humidifier to keep the humidity level in your home above 50%.  Inhale steam for 10-15 minutes, 3-4 times a day or as told by your health care provider. You can do this in the bathroom while a hot shower is running.  Limit your exposure to cool or dry air. Rest  Rest as much as possible.  Sleep with your head raised (elevated).  Make sure to get enough sleep each night. General instructions  Apply a warm, moist washcloth to your face 3-4 times a day or as told by your  health care provider. This will help with discomfort.  Wash your hands often with soap and water to reduce your exposure to viruses and other germs. If soap and water are not available, use hand sanitizer.  Do not smoke. Avoid being around people who are smoking (secondhand smoke).  Keep all follow-up visits as told by your health care provider. This is important. Contact a health care provider if:  You have a fever.  Your symptoms get worse.  Your symptoms do not improve within 10 days. Get help right away if:  You have a severe headache.  You have persistent vomiting.  You have pain or swelling around your face or eyes.  You have vision problems.  You develop confusion.  Your neck is stiff.  You have trouble breathing. This information is not intended to replace advice given to you by your health care provider. Make sure you discuss any questions you have with your health care provider. Document Released: 04/07/2005 Document Revised: 12/02/2015 Document Reviewed: 01/31/2015 Elsevier Interactive Patient Education  2017 Elsevier Inc.  

## 2016-11-27 ENCOUNTER — Emergency Department (HOSPITAL_COMMUNITY): Payer: No Typology Code available for payment source

## 2016-11-27 ENCOUNTER — Ambulatory Visit: Payer: Self-pay | Admitting: *Deleted

## 2016-11-27 ENCOUNTER — Encounter (HOSPITAL_COMMUNITY): Payer: Self-pay | Admitting: Emergency Medicine

## 2016-11-27 ENCOUNTER — Emergency Department (HOSPITAL_COMMUNITY)
Admission: EM | Admit: 2016-11-27 | Discharge: 2016-11-27 | Disposition: A | Discharge status: Home / Self Care | Payer: No Typology Code available for payment source | Attending: Emergency Medicine | Admitting: Emergency Medicine

## 2016-11-27 VITALS — BP 142/84 | HR 97

## 2016-11-27 DIAGNOSIS — R0602 Shortness of breath: Secondary | ICD-10-CM

## 2016-11-27 DIAGNOSIS — I480 Paroxysmal atrial fibrillation: Secondary | ICD-10-CM | POA: Diagnosis not present

## 2016-11-27 DIAGNOSIS — E119 Type 2 diabetes mellitus without complications: Secondary | ICD-10-CM | POA: Diagnosis not present

## 2016-11-27 DIAGNOSIS — R002 Palpitations: Secondary | ICD-10-CM | POA: Insufficient documentation

## 2016-11-27 DIAGNOSIS — Z86718 Personal history of other venous thrombosis and embolism: Secondary | ICD-10-CM | POA: Diagnosis not present

## 2016-11-27 DIAGNOSIS — Z7982 Long term (current) use of aspirin: Secondary | ICD-10-CM | POA: Diagnosis not present

## 2016-11-27 DIAGNOSIS — Z86711 Personal history of pulmonary embolism: Secondary | ICD-10-CM

## 2016-11-27 DIAGNOSIS — Z8679 Personal history of other diseases of the circulatory system: Secondary | ICD-10-CM

## 2016-11-27 DIAGNOSIS — R55 Syncope and collapse: Secondary | ICD-10-CM

## 2016-11-27 LAB — BASIC METABOLIC PANEL
Anion gap: 11 (ref 5–15)
BUN: 17 mg/dL (ref 6–20)
CO2: 19 mmol/L — ABNORMAL LOW (ref 22–32)
Calcium: 9.3 mg/dL (ref 8.9–10.3)
Chloride: 108 mmol/L (ref 101–111)
Creatinine, Ser: 0.85 mg/dL (ref 0.44–1.00)
GFR calc Af Amer: >60 mL/min (ref >60)
GFR calc non Af Amer: >60 mL/min (ref >60)
Glucose, Bld: 110 mg/dL — ABNORMAL HIGH (ref 65–99)
Potassium: 3.6 mmol/L (ref 3.5–5.1)
Sodium: 138 mmol/L (ref 135–145)

## 2016-11-27 LAB — CBC
HCT: 40.5 % (ref 36.0–46.0)
Hemoglobin: 13 g/dL (ref 12.0–15.0)
MCH: 25.9 pg — ABNORMAL LOW (ref 26.0–34.0)
MCHC: 32.1 g/dL (ref 30.0–36.0)
MCV: 80.8 fL (ref 78.0–100.0)
Platelets: 216 10*3/uL (ref 150–400)
RBC: 5.01 MIL/uL (ref 3.87–5.11)
RDW: 14.6 % (ref 11.5–15.5)
WBC: 7.5 10*3/uL (ref 4.0–10.5)

## 2016-11-27 LAB — D-DIMER, QUANTITATIVE (NOT AT ARMC): D-Dimer, Quant: 0.89 ug/mL-FEU — ABNORMAL HIGH (ref 0.00–0.50)

## 2016-11-27 LAB — I-STAT TROPONIN, ED: Troponin i, poc: 0 ng/mL (ref 0.00–0.08)

## 2016-11-27 MED ORDER — ALBUTEROL SULFATE (2.5 MG/3ML) 0.083% IN NEBU
INHALATION_SOLUTION | RESPIRATORY_TRACT | Status: AC
  Filled 2016-11-27: qty 6

## 2016-11-27 MED ORDER — IOPAMIDOL (ISOVUE-370) INJECTION 76%
INTRAVENOUS | Status: AC
  Administered 2016-11-27: 100 mL
  Filled 2016-11-27: qty 100

## 2016-11-27 MED ORDER — ALBUTEROL SULFATE (2.5 MG/3ML) 0.083% IN NEBU
5.0000 mg | INHALATION_SOLUTION | Freq: Once | RESPIRATORY_TRACT | Status: AC
  Administered 2016-11-27: 5 mg via RESPIRATORY_TRACT

## 2016-11-27 NOTE — Progress Notes (Signed)
Pt called RN to her desk in call center stating "I think I'm in trouble, I'm short of breath, feel like I'm going to pass out, really shaky and my pulse is really high." Upon RN arrival, pt's BP 198/118, CBG 112, p101, regular. She reported her O2 sat just before calling RN was 77% with p 120's.   Hx PE, DVT, Afib. Recently taken off Plavix switched to ASA as she is one year out from PE. She sts since having sinus inf last week, she has been progressively feeling worse with episodes similar to this occurring more frequently and becoming more intense over past week.   Pt given option to call cardiology office or proceed to ER. She prefers ER directly. Declines to have ambulance transport as her vitals have now stabilized. Called spouse for transport. RN with pt until spouse arrived and assisted pt in to vehicle. Monitored throughout, pulse and O2 sat remained WNL. Will f/u with pt tomorrow and next week.

## 2016-11-27 NOTE — ED Triage Notes (Signed)
Pt very anxious at triage, crying and shaking. Pt reports hx of PE 1 year ago. Pt reports shortness of breath for 5 days, was seen by [facility] and told she had a virus. Pt reports she feels like she is having palpitations. Nervous she has a PE. Also reports her legs feel heavy when walking.

## 2016-11-27 NOTE — Discharge Instructions (Signed)
Your work up was reassuring today. Please follow up with PCM as discussed. Return to ED with any new or worsening symptoms.

## 2016-11-27 NOTE — ED Provider Notes (Signed)
Complains of palpitations and feeling her heart is beating "hard" for 4 days. She reports that she had a temperature of 102 one week ago. She also complains of difficulty breathing and pain at her left scapula which feels similar to pain that she had from a pulmonary embolism in the past. On exam she is in no distress speaks in paragraphs lungs clear auscultation heart regular rate and rhythm abdomen obese, nontender extremities without edema skin warm dry   Orlie Dakin, MD 11/27/16 1550

## 2016-11-27 NOTE — ED Provider Notes (Signed)
[facility] DEPT Provider Note   CSN: [id number] Arrival date & time: 11/27/16  1124     History   Chief Complaint Chief Complaint  Patient presents with  . Palpitations  . Shortness of Breath    HPI [name] is a 66 y.o. female. 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 Patient reports symptoms are similar to previous PE one year ago. She currently takes ASA daily, was taken off eliquis in June 2018. She denies any chest pain. She denies exertional component to symptoms. She denies leg swelling - did feel some right groin pain last week which has resolved.  HPI  Past Medical History:  Diagnosis Date  . Abnormal thyroid function test   . Anemia    she attributes previously to fibroids, she declines  . Barrett's esophagus   . DDD (degenerative disc disease), cervical   . DJD (degenerative joint disease)   . GERD (gastroesophageal reflux disease)   . Hx of adenomatous polyp of colon 03/01/2002  . OA (osteoarthritis)   . Obese   . Paroxysmal atrial fibrillation ([facility])   . PE (pulmonary embolism) 12/21/2015  . Plantar fasciitis   . Type 2 diabetes mellitus ([place])    diet controlled    Patient Active Problem List   Diagnosis Date Noted  . Dyspnea 10/08/2016  . Paroxysmal atrial fibrillation ([place]) 06/18/2016  . Type 2 diabetes mellitus ([place]) 01/21/2016  . H/O Spinal surgery 12/21/2015  . Barrett's esophagus 12/21/2015  . Hx of adenomatous polyp of colon 03/01/2002    Past Surgical History:  Procedure Laterality Date  . ABDOMINAL HYSTERECTOMY    . Arthroscopic surgery left knee    . BACK SURGERY    . CESAREAN SECTION    . CHOLECYSTECTOMY    . COLONOSCOPY  2003, 2011   3 mm adenoma 2011  . ESOPHAGOGASTRODUODENOSCOPY  multiple since 2003  . IR GENERIC HISTORICAL  12/19/2015   IR ANGIOGRAM SELECTIVE EACH ADDITIONAL VESSEL 12/19/2015 Greggory Keen, MD [facility]  . IR GENERIC HISTORICAL  12/19/2015   IR ANGIOGRAM PULMONARY BILATERAL SELECTIVE 12/19/2015 Greggory Keen, MD [facility]  . IR GENERIC HISTORICAL  12/19/2015   IR INFUSION THROMBOL ARTERIAL INITIAL (MS) 12/19/2015 Greggory Keen, MD [facility]  . IR GENERIC HISTORICAL  12/19/2015   IR US GUIDE VASC ACCESS RIGHT 12/19/2015 Greggory Keen, MD [facility]  . IR GENERIC HISTORICAL  12/19/2015   IR ANGIOGRAM SELECTIVE EACH ADDITIONAL VESSEL 12/19/2015 Greggory Keen, MD [facility]  . IR GENERIC HISTORICAL  12/20/2015   IR THROMB F/U EVAL ART/VEN FINAL DAY (MS) 12/20/2015 Corrie Mckusick, DO [facility]  . IR GENERIC HISTORICAL  12/19/2015   IR INFUSION THROMBOL ARTERIAL INITIAL (MS) 12/19/2015 Greggory Keen, MD [facility]  . Right knee replacement    . SPINAL FUSION  10/31/2015   revision at [place]    . VENOUS ABLATION     x 2    OB History    No data available       Home Medications    Prior to Admission medications   Medication Sig Start Date End Date Taking? Authorizing Provider  aspirin EC 81 MG tablet Take 81 mg by mouth daily.   Yes [provider]  Cholecalciferol (VITAMIN D-3) 1000 units CAPS Take 1,000 Units by mouth every evening.    Yes  [provider]  pantoprazole (PROTONIX) 40 MG tablet Take 1 tablet (40 mg total) by mouth daily. Patient taking differently: Take 40 mg by mouth at bedtime.  12/22/15  Yes Verlee Monte, MD  fluticasone (FLONASE) 50 MCG/ACT nasal spray Place 1 spray into both nostrils 2 (two) times daily. Patient not taking: Reported on 11/27/2016 11/12/16   Pleas Koch, NP  levocetirizine (XYZAL) 5 MG tablet Take 1 tablet (5 mg total) by mouth every evening. Patient not taking: Reported on 11/27/2016 11/12/16   Pleas Koch, NP  montelukast (SINGULAIR) 10 MG tablet Take 1 tablet (10 mg total) by mouth at bedtime. Patient not taking: Reported on 11/27/2016 11/20/16   Gerarda Fraction A, NP  ondansetron (ZOFRAN ODT) 4 MG disintegrating tablet Take 1 tablet (4 mg total) by mouth every 8 (eight) hours as needed for nausea or vomiting. Patient not taking: Reported on 11/12/2016 06/18/16   Pleas Koch, NP    Family History Family History  Problem Relation Age of Onset  . Stroke Maternal Grandmother   . Bladder Cancer Maternal Grandmother   . Hyperthyroidism Mother        Radioactive Iodine Treatment  . Hypothyroidism Mother   . Cervical cancer Maternal Aunt     Social History Social History  Substance Use Topics  . Smoking status: Never Smoker  . Smokeless tobacco: Never Used  . Alcohol use No     Allergies   Ciprofloxacin; Doxycycline; Strawberry extract; Tetracyclines & related; and Amoxicillin   Review of Systems Review of Systems  Constitutional: Negative for chills and fever.  HENT: Negative for ear pain and sore throat.   Eyes: Negative for pain and visual disturbance.  Respiratory: Positive for shortness of breath. Negative for cough.   Cardiovascular: Negative for chest pain, palpitations and leg swelling (bilateral leg heaviness).  Gastrointestinal: Negative for abdominal pain and vomiting.  Genitourinary: Negative for dysuria and hematuria.  Musculoskeletal: Negative for arthralgias and back pain.  Skin: Negative for color change and rash.  Neurological: Negative for seizures and syncope.  All other systems reviewed and are negative.    Physical Exam Updated Vital Signs BP 136/76   Pulse 64   Temp 98.1 F (36.7 C)   Resp 17   Ht 5\' 2"  (1.575 m)   Wt 115.2 kg (254 lb)   SpO2 100%   BMI 46.46 kg/m   Physical Exam  Constitutional: She appears well-developed and well-nourished. She appears distressed (mild).  HENT:  Head: Normocephalic and atraumatic.  Eyes: Conjunctivae are normal.  Neck: Neck supple.  Cardiovascular: Normal rate and regular rhythm.   No murmur heard. Pulmonary/Chest: Effort normal and breath sounds  normal. No respiratory distress.  Abdominal: Soft. There is no tenderness.  Musculoskeletal: She exhibits no edema.  Neurological: She is alert.  Skin: Skin is warm and dry.  Psychiatric: Her mood appears anxious.  Nursing note and vitals reviewed.    ED Treatments / Results  Labs (all labs ordered are listed, but only abnormal results are displayed) Labs Reviewed  BASIC METABOLIC PANEL - Abnormal; Notable for the following:       Result Value   CO2 19 (*)    Glucose, Bld 110 (*)    All other components within normal limits  CBC - Abnormal; Notable for the following:    MCH 25.9 (*)    All other components within normal limits  D-DIMER, QUANTITATIVE (NOT AT [facility]) - Abnormal; Notable for the following:  D-Dimer, Quant 0.89 (*)    All other components within normal limits  I-STAT TROPONIN, ED    EKG  EKG Interpretation  Date/Time:  Thursday November 27 2016 11:34:03 EDT Ventricular Rate:  65 PR Interval:  152 QRS Duration: 82 QT Interval:  426 QTC Calculation: 443 R Axis:   -28 Text Interpretation:  Normal sinus rhythm Normal ECG No STEMI.  Confirmed by Nanda Quinton [phone]) on 11/27/2016 2:55:52 PM       Radiology Dg Chest 2 View  Result Date: 11/27/2016 CLINICAL DATA:  Atrial fibrillation, chest pain, low oxygen level for 2 days, shortness of breath, weakness, history hypertension, type II diabetes mellitus EXAM: CHEST  2 VIEW COMPARISON:  12/20/2015 FINDINGS: Normal heart size, mediastinal contours, and pulmonary vascularity. Lungs clear. No pleural effusion or pneumothorax. Bones demineralized with scattered endplate spurs thoracic spine. IMPRESSION: No acute abnormalities. Electronically Signed   By: Lavonia Dana M.D.   On: 11/27/2016 12:02   Ct Angio Chest Pe W And/or Wo Contrast  Result Date: 11/27/2016 CLINICAL DATA:  Heart palpitations and shortness of breath EXAM: CT ANGIOGRAPHY CHEST WITH CONTRAST TECHNIQUE: Multidetector CT imaging of the chest was performed  using the standard protocol during bolus administration of intravenous contrast. Multiplanar CT image reconstructions and MIPs were obtained to evaluate the vascular anatomy. CONTRAST:  80 mL Isovue 370 COMPARISON:  Chest radiograph 11/27/2016 CTA chest 12/19/2015 FINDINGS: Cardiovascular: Contrast injection is sufficient to demonstrate satisfactory opacification of the pulmonary arteries to the segmental level. The distal segmental branches of the lung bases are obscured by respiratory motion.There is no pulmonary embolus. The main pulmonary artery is within normal limits for size. There is no CT evidence of acute right heart strain. The visualized aorta is normal. There is variant aortic arch branching with an independent origin of the left vertebral artery. Heart size is normal, without pericardial effusion. Mediastinum/Nodes: There is a small sliding-type hiatal hernia. No mediastinal or axillary lymphadenopathy. Lungs/Pleura: There is an area of ground-glass opacity in the right middle lobe. There is bibasilar atelectasis. No pleural effusion. Upper Abdomen: Contrast bolus timing is not optimized for evaluation of the abdominal organs. There is mild pneumobilia. Musculoskeletal: No chest wall abnormality. No acute or significant osseous findings. Review of the MIP images confirms the above findings. IMPRESSION: 1. No pulmonary embolus or acute aortic syndrome. 2. Ground-glass opacity in the right middle lobe may indicate developing pneumonia. 3. Mild pneumobilia, most commonly iatrogenic in the setting of prior biliary intervention or sphincterotomy. In the appropriate clinical setting, gas forming infection can also cause this finding. Electronically Signed   By: Ulyses Jarred M.D.   On: 11/27/2016 17:32    Procedures Procedures (including critical care time)  Medications Ordered in ED Medications  albuterol (PROVENTIL) (2.5 MG/3ML) 0.083% nebulizer solution 5 mg (5 mg Nebulization Given 11/27/16 1140)    iopamidol (ISOVUE-370) 76 % injection (100 mLs  Contrast Given 11/27/16 1658)     Initial Impression / Assessment and Plan / ED Course  I have reviewed the triage vital signs and the nursing notes.  Pertinent labs & imaging results that were available during my care of the patient were reviewed by me and considered in my medical decision making (see chart for details).    The patient is a 67 year old female with history of provoked DVT/PE 1 year ago who presents with shortness of breath and palpitations. She has been off eliquis since June. Patient arrived hemodynamically stable, and no acute distress. Exam as above.  Given history of prior DVT/PE, CT chest obtained which was negative for acute PE. Labs otherwise stable. EKG stable from previous. Chest x-ray negative for acute findings. CT does comment on groundglass opacity in the right middle lobe, which could indicate developing pneumonia. However patient has had no recent cough or fevers to suggest a developing infectious process.  Multiple outpatient visits with discussion of dyspnea. Low risk myocardial perfusion study 08/2016. TTE 06/2016: EF 55%. Provided reassurance to patient, and she continued to be tearful. I discussed that I thought there was some component of anxiety, which she agrees that work is been very stressful lately. I discussed close follow-up with her primary care physician and her cardiologist. Patient is in agreement with plan at time of discharge.  Patient and plan of care discussed with Attending physician, Dr. Winfred Leeds.    Final Clinical Impressions(s) / ED Diagnoses   Final diagnoses:  Palpitations  Shortness of breath    New Prescriptions Discharge Medication List as of 11/27/2016  5:43 PM       Arnetha Massy, MD 11/28/16 0128    Orlie Dakin, MD 11/29/16 [phone]

## 2016-11-27 NOTE — ED Notes (Signed)
Patient transported to CT 

## 2016-11-29 ENCOUNTER — Encounter: Payer: Self-pay | Admitting: Primary Care

## 2016-11-30 NOTE — Telephone Encounter (Signed)
Please schedule a 30 minute ED follow up. Thanks.

## 2016-12-01 NOTE — Telephone Encounter (Signed)
Patient called in earlier today and already schedule appt.

## 2016-12-03 ENCOUNTER — Ambulatory Visit (INDEPENDENT_AMBULATORY_CARE_PROVIDER_SITE_OTHER): Payer: No Typology Code available for payment source | Admitting: Primary Care

## 2016-12-03 ENCOUNTER — Encounter: Payer: Self-pay | Admitting: Primary Care

## 2016-12-03 ENCOUNTER — Other Ambulatory Visit: Payer: Self-pay | Admitting: Primary Care

## 2016-12-03 VITALS — BP 124/74 | HR 74 | Temp 98.2°F | Ht 62.0 in | Wt 257.8 lb

## 2016-12-03 DIAGNOSIS — E538 Deficiency of other specified B group vitamins: Secondary | ICD-10-CM

## 2016-12-03 DIAGNOSIS — R05 Cough: Secondary | ICD-10-CM | POA: Diagnosis not present

## 2016-12-03 DIAGNOSIS — R5081 Fever presenting with conditions classified elsewhere: Secondary | ICD-10-CM | POA: Diagnosis not present

## 2016-12-03 DIAGNOSIS — R002 Palpitations: Secondary | ICD-10-CM

## 2016-12-03 DIAGNOSIS — R059 Cough, unspecified: Secondary | ICD-10-CM

## 2016-12-03 DIAGNOSIS — R7303 Prediabetes: Secondary | ICD-10-CM

## 2016-12-03 LAB — BASIC METABOLIC PANEL
BUN: 17 mg/dL (ref 6–23)
CO2: 28 mEq/L (ref 19–32)
Calcium: 9.2 mg/dL (ref 8.4–10.5)
Chloride: 105 mEq/L (ref 96–112)
Creatinine, Ser: 0.76 mg/dL (ref 0.40–1.20)
GFR: 80.88 mL/min (ref >60.00)
Glucose, Bld: 95 mg/dL (ref 70–99)
Potassium: 3.9 mEq/L (ref 3.5–5.1)
Sodium: 139 mEq/L (ref 135–145)

## 2016-12-03 LAB — MAGNESIUM: Magnesium: 1.9 mg/dL (ref 1.5–2.5)

## 2016-12-03 LAB — VITAMIN B12: Vitamin B-12: 190 pg/mL — ABNORMAL LOW (ref 211–911)

## 2016-12-03 MED ORDER — AMOXICILLIN-POT CLAVULANATE 875-125 MG PO TABS: 1.0000 | ORAL_TABLET | Freq: Two times a day (BID) | ORAL | 0 refills | Status: DC

## 2016-12-03 NOTE — Patient Instructions (Signed)
Complete lab work prior to leaving today. I will notify you of your results once received.   Start Augmentin antibiotics. Take 1 tablet by mouth twice daily for 10 days. Please call me if you have any problems with this medication. Complete this antibiotic even if you start feeling better.  Schedule a lab only appointment in 1 month to recheck your A1C.  It was a pleasure to see you today!

## 2016-12-03 NOTE — Progress Notes (Signed)
Subjective:    Patient ID: [name], female    DOB: 12-27-1950, 66 y.o.   MRN: [id number]  HPI  [name] is a 66 year old female with a history of pulmonary embolism, palpitations, GERD who presents today for emergency department follow up.  She presented to [facility] on 11/27/16 with a one week history of palpitations and shortness of breath that felt similar to her symptoms when diagnosed with pulmonary embolism. She was sitting at work when she suddenly noticed palpitations. She was not under stress or feeling anxious. She is currently managed on Aspirin and has been off of Eliquis since June 2018.  During her stay in the emergency department she underwent ECG (NSR, no STEMI), lab work including CBC, CMP, D-Dimer, Troponin, and CT angio of her chest. Lab work was mostly unremarkable. CT angio of chest was negative for PE, however, did show opacity to right middle lobe that was suspicious for pneumonia. She was not treated for pneumonia given lack of cough, fevers, URI symptoms. She was released home given results from her visit and also given unremarkable cardiac work up that was completed in Spring 2018. Symptoms were suspected to be secondary to anxiety.  Since her emergency department visit she's feeling bad. She's had intermittent fevers of 101-102 since her visit in late July 2018, last fever of 101 this morning. She's noticed a dry cough, post-nasal drip, shortness of breath. She was treated with Amoxicillin 875 mg for a 10 day course on 11/20/16 for an ear infection. She took three days of this medication and then stopped. She denies calf pain, swelling.   Review of Systems  Constitutional: Positive for fatigue and fever.  HENT: Positive for congestion. Negative for ear pain and sore throat.   Respiratory: Positive for cough.   Cardiovascular: Positive for palpitations. Negative for chest pain and leg swelling.  Skin: Negative for color change.  Allergic/Immunologic: Positive  for environmental allergies.       Past Medical History:  Diagnosis Date  . Abnormal thyroid function test   . Anemia    she attributes previously to fibroids, she declines  . Barrett's esophagus   . DDD (degenerative disc disease), cervical   . DJD (degenerative joint disease)   . GERD (gastroesophageal reflux disease)   . Hx of adenomatous polyp of colon 03/01/2002  . OA (osteoarthritis)   . Obese   . Paroxysmal atrial fibrillation (HCC)   . PE (pulmonary embolism) 12/21/2015  . Plantar fasciitis   . Type 2 diabetes mellitus (HCC)    diet controlled     Social History   Social History  . Marital status: Married    Spouse name: N/A  . Number of children: 3  . Years of education: N/A   Occupational History  .      Telephone sales   Social History Main Topics  . Smoking status: Never Smoker  . Smokeless tobacco: Never Used  . Alcohol use No  . Drug use: No  . Sexual activity: Not on file   Other Topics Concern  . Not on file   Social History Narrative   Married.   3 children, 2 grandchildren.   Work's in a call center.   Enjoys reading, sewing, spending time with her grandchildren.    Past Surgical History:  Procedure Laterality Date  . ABDOMINAL HYSTERECTOMY    . Arthroscopic surgery left knee    . BACK SURGERY    . CESAREAN SECTION    .  CHOLECYSTECTOMY    . COLONOSCOPY  2003, 2011   3 mm adenoma 2011  . ESOPHAGOGASTRODUODENOSCOPY  multiple since 2003  . IR GENERIC HISTORICAL  12/19/2015   IR ANGIOGRAM SELECTIVE EACH ADDITIONAL VESSEL 12/19/2015 [name], MD [facility]  . IR GENERIC HISTORICAL  12/19/2015   IR ANGIOGRAM PULMONARY BILATERAL SELECTIVE 12/19/2015 [name], MD [facility]  . IR GENERIC HISTORICAL  12/19/2015   IR INFUSION THROMBOL ARTERIAL INITIAL (MS) 12/19/2015 [name], MD [facility]  . IR GENERIC HISTORICAL  12/19/2015   IR US GUIDE VASC ACCESS RIGHT 12/19/2015 [name], MD [facility]  . IR GENERIC  HISTORICAL  12/19/2015   IR ANGIOGRAM SELECTIVE EACH ADDITIONAL VESSEL 12/19/2015 [name], MD [facility]  . IR GENERIC HISTORICAL  12/20/2015   IR THROMB F/U EVAL ART/VEN FINAL DAY (MS) 12/20/2015 Gilmer Mor, DO [facility]  . IR GENERIC HISTORICAL  12/19/2015   IR INFUSION THROMBOL ARTERIAL INITIAL (MS) 12/19/2015 [name], MD [facility]  . Right knee replacement    . SPINAL FUSION  10/31/2015   revision at [facility]   . TONSILLECTOMY    . VENOUS ABLATION     x 2    Family History  Problem Relation Age of Onset  . Stroke Maternal Grandmother   . Bladder Cancer Maternal Grandmother   . Hyperthyroidism Mother        Radioactive Iodine Treatment  . Hypothyroidism Mother   . Cervical cancer Maternal Aunt     Allergies  Allergen Reactions  . Ciprofloxacin Anaphylaxis  . Doxycycline Anaphylaxis  . Strawberry Extract Anaphylaxis  . Tetracyclines & Related Anaphylaxis  . Amoxicillin Other (See Comments)    Has patient had a PCN reaction causing immediate rash, facial/tongue/throat swelling, SOB or lightheadedness with hypotension: No Has patient had a PCN reaction causing severe rash involving mucus membranes or skin necrosis: No Has patient had a PCN reaction that required hospitalization: No Has patient had a PCN reaction occurring within the last 10 years: Yes If all of the above answers are "NO", then may proceed with Cephalosporin use.   Affected her AFIB    Current Outpatient Prescriptions on File Prior to Visit  Medication Sig Dispense Refill  . aspirin EC 81 MG tablet Take 81 mg by mouth daily.    . Cholecalciferol (VITAMIN D-3) 1000 units CAPS Take 1,000 Units by mouth every evening.     . fluticasone (FLONASE) 50 MCG/ACT nasal spray Place 1 spray into both nostrils 2 (two) times daily. (Patient not taking: Reported on 11/27/2016) 16 g 0  . levocetirizine (XYZAL) 5 MG tablet Take 1 tablet (5 mg total) by mouth every evening. (Patient not taking:  Reported on 11/27/2016) 90 tablet 0  . montelukast (SINGULAIR) 10 MG tablet Take 1 tablet (10 mg total) by mouth at bedtime. (Patient not taking: Reported on 11/27/2016) 30 tablet 3  . ondansetron (ZOFRAN ODT) 4 MG disintegrating tablet Take 1 tablet (4 mg total) by mouth every 8 (eight) hours as needed for nausea or vomiting. (Patient not taking: Reported on 11/12/2016) 20 tablet 0  . pantoprazole (PROTONIX) 40 MG tablet Take 1 tablet (40 mg total) by mouth daily. (Patient not taking: Reported on 12/03/2016) 30 tablet 0   No current facility-administered medications on file prior to visit.     BP 124/74   Pulse 74   Temp 98.2 F (36.8 C) (Oral)   Ht 5\' 2"  (1.575 m)   Wt 257 lb 12.8 oz (  116.9 kg)   SpO2 97%   BMI 47.15 kg/m    Objective:   Physical Exam  Constitutional: She appears well-nourished.  HENT:  Right Ear: Tympanic membrane and ear canal normal.  Left Ear: Tympanic membrane and ear canal normal.  Nose: Right sinus exhibits no maxillary sinus tenderness and no frontal sinus tenderness. Left sinus exhibits no maxillary sinus tenderness and no frontal sinus tenderness.  Mouth/Throat: Oropharynx is clear and moist.  Eyes: Conjunctivae are normal.  Neck: Neck supple.  Cardiovascular: Normal rate and regular rhythm.   Pulmonary/Chest: Effort normal and breath sounds normal. She has no wheezes. She has no rales.  Lymphadenopathy:    She has no cervical adenopathy.  Skin: Skin is warm and dry.  Psychiatric:  Seems worried about her emergency department visit, also with current symptoms.          Assessment & Plan:  Emergency Department Follow Up:  Evaluated for palpitations. Work up in the ED unremarkable for PE. CT chest did show likely early pneumonia.  Given symptoms of fevers, cough, shortness of breath, findings on CT chest, it is reasonable to treat for possible pneumonia.  Exam today with clear lungs, however, body habitus makes it difficult for auscultation.  Rx  for Augmentin course sent to pharmacy. Also repeat BMP, add Magnesium and B 12.  All hospital labs, imaging, and notes reviewed. Morrie Sheldon, NP

## 2016-12-30 ENCOUNTER — Ambulatory Visit (AMBULATORY_SURGERY_CENTER): Payer: Self-pay

## 2016-12-30 VITALS — Ht 62.0 in | Wt 258.2 lb

## 2016-12-30 DIAGNOSIS — K2270 Barrett's esophagus without dysplasia: Secondary | ICD-10-CM

## 2016-12-30 NOTE — Progress Notes (Signed)
Denies allergies to eggs or soy products. Denies complication of anesthesia or sedation. Denies use of weight loss medication. Denies use of O2.   Emmi instructions declined.  

## 2017-01-06 ENCOUNTER — Ambulatory Visit (AMBULATORY_SURGERY_CENTER): Payer: No Typology Code available for payment source | Admitting: Internal Medicine

## 2017-01-06 VITALS — BP 112/46 | HR 68 | Temp 97.3°F | Resp 14 | Ht 62.0 in | Wt 258.0 lb

## 2017-01-06 DIAGNOSIS — R1310 Dysphagia, unspecified: Secondary | ICD-10-CM

## 2017-01-06 DIAGNOSIS — K449 Diaphragmatic hernia without obstruction or gangrene: Secondary | ICD-10-CM | POA: Diagnosis not present

## 2017-01-06 DIAGNOSIS — K2210 Ulcer of esophagus without bleeding: Secondary | ICD-10-CM | POA: Diagnosis not present

## 2017-01-06 DIAGNOSIS — R1319 Other dysphagia: Secondary | ICD-10-CM

## 2017-01-06 DIAGNOSIS — K2270 Barrett's esophagus without dysplasia: Secondary | ICD-10-CM | POA: Diagnosis not present

## 2017-01-06 DIAGNOSIS — K3189 Other diseases of stomach and duodenum: Secondary | ICD-10-CM | POA: Diagnosis not present

## 2017-01-06 MED ORDER — PANTOPRAZOLE SODIUM 40 MG PO TBEC: 40.0000 mg | DELAYED_RELEASE_TABLET | Freq: Every day | ORAL | 3 refills | Status: DC

## 2017-01-06 MED ORDER — SODIUM CHLORIDE 0.9 % IV SOLN: 500.0000 mL | INTRAVENOUS | Status: DC

## 2017-01-06 NOTE — Progress Notes (Signed)
Pt's states no medical or surgical changes since previsit or office visit. 

## 2017-01-06 NOTE — Op Note (Signed)
[facility] Patient Name: [name] Procedure Date: 01/06/2017 2:09 PM MRN: [id number] Endoscopist: Iva Boop , MD Age: 66 Referring MD:  Date of Birth: 11/25/1950 Gender: Female Account #: [id number] Procedure:                Upper GI endoscopy Indications:              Dysphagia, Barrett's esophagus, Follow-up of                            Barrett's esophagus Medicines:                Propofol per Anesthesia, Monitored Anesthesia Care Procedure:                Pre-Anesthesia Assessment:                           - Prior to the procedure, a History and Physical                            was performed, and patient medications and                            allergies were reviewed. The patient's tolerance of                            previous anesthesia was also reviewed. The risks                            and benefits of the procedure and the sedation                            options and risks were discussed with the patient.                            All questions were answered, and informed consent                            was obtained. Prior Anticoagulants: The patient has                            taken no previous anticoagulant or antiplatelet                            agents. ASA Grade Assessment: III - A patient with                            severe systemic disease. After reviewing the risks                            and benefits, the patient was deemed in                            satisfactory condition to undergo the procedure.  After obtaining informed consent, the endoscope was                            passed under direct vision. Throughout the                            procedure, the patient's blood pressure, pulse, and                            oxygen saturations were monitored continuously. The                            Endoscope was introduced through the mouth, and                            advanced to  the second part of duodenum. The upper                            GI endoscopy was accomplished without difficulty.                            The patient tolerated the procedure well. Scope In: Scope Out: Findings:                 LA Grade B (one or more mucosal breaks greater than                            5 mm, not extending between the tops of two mucosal                            folds) esophagitis with no bleeding was found 28 to                            30 cm from the incisors. Biopsies were taken with a                            cold forceps for histology. Verification of patient                            identification for the specimen was done. Estimated                            blood loss was minimal.                           The esophagus and gastroesophageal junction were                            examined with white light and narrow band imaging                            (NBI) from a forward view and retroflexed position.  There were esophageal mucosal changes secondary to                            established long-segment Barrett's disease. These                            changes involved the mucosa at the upper extent of                            the gastric folds (34 cm from the incisors)                            extending to the Z-line (30 cm from the incisors).                            Circumferential salmon-colored mucosa was present                            from 30 to 34 cm. The maximum longitudinal extent                            of these esophageal mucosal changes was 4 cm in                            length. Mucosa was biopsied with a cold forceps for                            histology in 4 quadrants at intervals of 2 cm in                            the lower third of the esophagus. A total of 2                            specimen bottles were sent to pathology.                            Verification of patient  identification for the                            specimen was done. Estimated blood loss was minimal.                           A 6 cm hiatal hernia was present.                           The exam was otherwise without abnormality.                           The cardia and gastric fundus were normal on                            retroflexion. Complications:  No immediate complications. Estimated Blood Loss:     Estimated blood loss was minimal. Impression:               - LA Grade B reflux esophagitis. Biopsied.                           - Esophageal mucosal changes secondary to                            established long-segment Barrett's disease.                            Biopsied.                           - 6 cm hiatal hernia.                           - The examination was otherwise normal. Recommendation:           - Patient has a contact number available for                            emergencies. The signs and symptoms of potential                            delayed complications were discussed with the                            patient. Return to normal activities tomorrow.                            Written discharge instructions were provided to the                            patient.                           - Resume previous diet.                           - Continue present medications.                           - Await pathology results.                           - Use Protonix (pantoprazole) 40 mg PO daily                            indefinitely.                           - Follow an antireflux regimen.                           - Align qd and IB Gard for IBS Sxs  will f/u in October - already has appointment Iva Boop, MD 01/06/2017 2:44:51 PM This report has been signed electronically.

## 2017-01-06 NOTE — Progress Notes (Signed)
Report given to PACU, vss 

## 2017-01-06 NOTE — Progress Notes (Signed)
Called to room to assist during endoscopic procedure.  Patient ID and intended procedure confirmed with present staff. Received instructions for my participation in the procedure from the performing physician.  

## 2017-01-06 NOTE — Patient Instructions (Addendum)
There is inflammation in the esophagus - you need to be on an acid blocking medication.  I have prescribed pantoprazole - I think this will heal the esophagus and make you feel better.  I also want you to try the following over the counter treatments  1) Align - probiotic - take 1 each day 2) IB Gard take 2 before breakfast and 2 before supper  I will see you as planned in October.  I appreciate the opportunity to care for you. Gatha Mayer, MD, FACG   YOU HAD AN ENDOSCOPIC PROCEDURE TODAY AT [facility]:   Refer to the procedure report that was given to you for any specific questions about what was found during the examination.  If the procedure report does not answer your questions, please call your gastroenterologist to clarify.  If you requested that your care partner not be given the details of your procedure findings, then the procedure report has been included in a sealed envelope for you to review at your convenience later.  YOU SHOULD EXPECT: Some feelings of bloating in the abdomen. Passage of more gas than usual.  Walking can help get rid of the air that was put into your GI tract during the procedure and reduce the bloating. If you had a lower endoscopy (such as a colonoscopy or flexible sigmoidoscopy) you may notice spotting of blood in your stool or on the toilet paper. If you underwent a bowel prep for your procedure, you may not have a normal bowel movement for a few days.  Please Note:  You might notice some irritation and congestion in your nose or some drainage.  This is from the oxygen used during your procedure.  There is no need for concern and it should clear up in a day or so.  SYMPTOMS TO REPORT IMMEDIATELY:   Following upper endoscopy (EGD)  Vomiting of blood or coffee ground material  New chest pain or pain under the shoulder blades  Painful or persistently difficult swallowing  New shortness of breath  Fever of 100F or  higher  Black, tarry-looking stools  For urgent or emergent issues, a gastroenterologist can be reached at any hour by calling [phone].   DIET:  We do recommend a small meal at first, but then you may proceed to your regular diet.  Drink plenty of fluids but you should avoid alcoholic beverages for 24 hours.  ACTIVITY:  You should plan to take it easy for the rest of today and you should NOT DRIVE or use heavy machinery until tomorrow (because of the sedation medicines used during the test).    FOLLOW UP: Our staff will call the number listed on your records the next business day following your procedure to check on you and address any questions or concerns that you may have regarding the information given to you following your procedure. If we do not reach you, we will leave a message.  However, if you are feeling well and you are not experiencing any problems, there is no need to return our call.  We will assume that you have returned to your regular daily activities without incident.  If any biopsies were taken you will be contacted by phone or by letter within the next 1-3 weeks.  Please call us at [phone] if you have not heard about the biopsies in 3 weeks.   Await for biopsy results  Hiatal Hernia (handout given)    SIGNATURES/CONFIDENTIALITY: You and/or  your care partner have signed paperwork which will be entered into your electronic medical record.  These signatures attest to the fact that that the information above on your After Visit Summary has been reviewed and is understood.  Full responsibility of the confidentiality of this discharge information lies with you and/or your care-partner.

## 2017-01-07 ENCOUNTER — Telehealth: Payer: Self-pay | Admitting: *Deleted

## 2017-01-07 ENCOUNTER — Other Ambulatory Visit (INDEPENDENT_AMBULATORY_CARE_PROVIDER_SITE_OTHER): Payer: No Typology Code available for payment source

## 2017-01-07 DIAGNOSIS — R7303 Prediabetes: Secondary | ICD-10-CM | POA: Diagnosis not present

## 2017-01-07 DIAGNOSIS — E538 Deficiency of other specified B group vitamins: Secondary | ICD-10-CM | POA: Diagnosis not present

## 2017-01-07 LAB — VITAMIN B12: Vitamin B-12: 222 pg/mL (ref 211–911)

## 2017-01-07 LAB — HEMOGLOBIN A1C: Hgb A1c MFr Bld: 6.5 % (ref 4.6–6.5)

## 2017-01-07 NOTE — Telephone Encounter (Signed)
  Follow up Call-  Call back number 01/06/2017  Post procedure Call Back phone  # [phone]  Permission to leave phone message Yes  Some recent data might be hidden     No answer- LM that we will attempt call again  later today  [place]

## 2017-01-07 NOTE — Telephone Encounter (Signed)
No answer. Message left to call if questions or concerns.

## 2017-01-08 ENCOUNTER — Encounter: Payer: Self-pay | Admitting: Primary Care

## 2017-01-18 ENCOUNTER — Encounter: Payer: Self-pay | Admitting: Internal Medicine

## 2017-01-18 NOTE — Progress Notes (Signed)
Barrett's no dysplasia - long-segment Recall 2021 (3 yrs) EGD My Chart letter

## 2017-01-21 ENCOUNTER — Encounter: Payer: Self-pay | Admitting: Internal Medicine

## 2017-01-21 ENCOUNTER — Ambulatory Visit (INDEPENDENT_AMBULATORY_CARE_PROVIDER_SITE_OTHER): Payer: No Typology Code available for payment source | Admitting: Internal Medicine

## 2017-01-21 ENCOUNTER — Other Ambulatory Visit (INDEPENDENT_AMBULATORY_CARE_PROVIDER_SITE_OTHER): Payer: No Typology Code available for payment source

## 2017-01-21 VITALS — BP 120/70 | HR 64 | Ht 62.0 in | Wt 257.4 lb

## 2017-01-21 DIAGNOSIS — K6289 Other specified diseases of anus and rectum: Secondary | ICD-10-CM

## 2017-01-21 DIAGNOSIS — K644 Residual hemorrhoidal skin tags: Secondary | ICD-10-CM

## 2017-01-21 DIAGNOSIS — K582 Mixed irritable bowel syndrome: Secondary | ICD-10-CM | POA: Diagnosis not present

## 2017-01-21 DIAGNOSIS — R140 Abdominal distension (gaseous): Secondary | ICD-10-CM | POA: Diagnosis not present

## 2017-01-21 DIAGNOSIS — R10813 Right lower quadrant abdominal tenderness: Secondary | ICD-10-CM | POA: Diagnosis not present

## 2017-01-21 DIAGNOSIS — K2270 Barrett's esophagus without dysplasia: Secondary | ICD-10-CM

## 2017-01-21 DIAGNOSIS — K641 Second degree hemorrhoids: Secondary | ICD-10-CM

## 2017-01-21 DIAGNOSIS — K589 Irritable bowel syndrome without diarrhea: Secondary | ICD-10-CM | POA: Insufficient documentation

## 2017-01-21 DIAGNOSIS — M6289 Other specified disorders of muscle: Secondary | ICD-10-CM

## 2017-01-21 DIAGNOSIS — N8110 Cystocele, unspecified: Secondary | ICD-10-CM

## 2017-01-21 LAB — BUN: BUN: 14 mg/dL (ref 6–23)

## 2017-01-21 LAB — CREATININE, SERUM: Creatinine, Ser: 0.79 mg/dL (ref 0.40–1.20)

## 2017-01-21 NOTE — Progress Notes (Signed)
[name] 66 y.o. February 21, 1951 [id number]  Assessment & Plan:   Encounter Diagnoses  Name Primary?  . Irritable bowel syndrome with both constipation and diarrhea Yes  . Bloating   . Right lower quadrant abdominal tenderness without rebound tenderness   . Prolapsed internal hemorrhoids, grade 2   . Anal skin tag   . Bladder prolapse, female, acquired   . Decreased anal sphincter tone   . Pelvic floor dysfunction in female   . Barrett's esophagus without dysplasia     Referred toPTto treat pelvic floor weakness and dysfunction CTscant abdomen and pelvis with contrast to evaluate right lower quadrant symptoms Lactulose hydrogenBreath testllooking for possible bacterial overgrowth Stop IB Gardas it has not helped   RO Ito Dr. Hector Shade has has returned  Office note from 2008 it was Dr. Annabell Howells and his impression was that she had a small capacity bladder with poor contractility and possible dysfunctional voiding. He tried Flomax. He referred her to physical therapy but it doesn't sound like she had done that Subjective:   Chief Complaint: follow-up of Barrett's esophagus and to establish care for GI problems  HPI The patient was recently seen for an EGD which demonstrated 3 cm Barrett's esophagus, she had transferred care to me, and a follow-up EGD was done for that. At that time we talked about bloating and symoms of IBS in recovery and I asked her to try IB Delene Ruffini but it isnot helping her symptoms  ++ early satiety  Hard time eating  Cramps and diarrhea in bathroom x 1 hr in AM, worse pc  May skip days and then goes multiple Tried FODMAPS Eliminated dairy All no help Has not tried dicyclomine, hyoscyamine Imodium prn - +/- helps If takes 2 is bloated Not related to days w/o stool Will sit, strain and force defecation "when I need to go"  Pain in belly button  Nausea, car-sickness after prn - calms stomach no effect on diarrhea Once she defecates urinates 4-5x  in a row  No leak - but bladder does not empty all the way - Pesary recommended she declined saw Ottelin yrs ago   Allergies  Allergen Reactions  . Ciprofloxacin Anaphylaxis  . Doxycycline Anaphylaxis  . Strawberry Extract Anaphylaxis  . Tetracyclines & Related Anaphylaxis  . Amoxicillin Other (See Comments)    Has patient had a PCN reaction causing immediate rash, facial/tongue/throat swelling, SOB or lightheadedness with hypotension: No Has patient had a PCN reaction causing severe rash involving mucus membranes or skin necrosis: No Has patient had a PCN reaction that required hospitalization: No Has patient had a PCN reaction occurring within the last 10 years: Yes If all of the above answers are "NO", then may proceed with Cephalosporin use.   Affected her AFIB   Current Meds  Medication Sig  . aspirin EC 81 MG tablet Take 81 mg by mouth daily.  . Cholecalciferol (VITAMIN D-3) 1000 units CAPS Take 1,000 Units by mouth every evening.   . fluticasone (FLONASE) 50 MCG/ACT nasal spray Place 1 spray into both nostrils 2 (two) times daily.  . ondansetron (ZOFRAN ODT) 4 MG disintegrating tablet Take 1 tablet (4 mg total) by mouth every 8 (eight) hours as needed for nausea or vomiting.  . pantoprazole (PROTONIX) 40 MG tablet Take 1 tablet (40 mg total) by mouth daily before breakfast.   Past Medical History:  Diagnosis Date  . Abnormal thyroid function test   . Allergy   . Anemia  she attributes previously to fibroids, she declines  . Barrett's esophagus   . Blood transfusion without reported diagnosis   . DDD (degenerative disc disease), cervical   . DJD (degenerative joint disease)   . GERD (gastroesophageal reflux disease)   . Heart murmur   . Hx of adenomatous polyp of colon 03/01/2002  . OA (osteoarthritis)   . Obese   . Paroxysmal atrial fibrillation (HCC)   . PE (pulmonary embolism) 12/21/2015  . Plantar fasciitis   . Type 2 diabetes mellitus (HCC)    diet  controlled   Past Surgical History:  Procedure Laterality Date  . ABDOMINAL HYSTERECTOMY    . Arthroscopic surgery left knee    . BACK SURGERY    . CESAREAN SECTION    . CHOLECYSTECTOMY    . COLONOSCOPY  2003, 2011   3 mm adenoma 2011  . ESOPHAGOGASTRODUODENOSCOPY  multiple since 2003  . IR GENERIC HISTORICAL  12/19/2015   IR ANGIOGRAM SELECTIVE EACH ADDITIONAL VESSEL 12/19/2015 [name], MD [facility]  . IR GENERIC HISTORICAL  12/19/2015   IR ANGIOGRAM PULMONARY BILATERAL SELECTIVE 12/19/2015 [name], MD [facility]  . IR GENERIC HISTORICAL  12/19/2015   IR INFUSION THROMBOL ARTERIAL INITIAL (MS) 12/19/2015 [name], MD [facility]  . IR GENERIC HISTORICAL  12/19/2015   IR US GUIDE VASC ACCESS RIGHT 12/19/2015 [name], MD [facility]  . IR GENERIC HISTORICAL  12/19/2015   IR ANGIOGRAM SELECTIVE EACH ADDITIONAL VESSEL 12/19/2015 [name], MD [facility]  . IR GENERIC HISTORICAL  12/20/2015   IR THROMB F/U EVAL ART/VEN FINAL DAY (MS) 12/20/2015 Gilmer Mor, DO [facility]  . IR GENERIC HISTORICAL  12/19/2015   IR INFUSION THROMBOL ARTERIAL INITIAL (MS) 12/19/2015 [name], MD [facility]  . Right knee replacement    . SPINAL FUSION  10/31/2015   revision at [facility]   . TONSILLECTOMY    . VENOUS ABLATION     x 2   Social History   Social History  . Marital status: Married    Spouse name: N/A  . Number of children: 3  . Years of education: N/A   Occupational History  .      Telephone sales   Social History Main Topics  . Smoking status: Never Smoker  . Smokeless tobacco: Never Used  . Alcohol use No  . Drug use: No   Social History Narrative   Married.   3 children, 2 grandchildren.   Work's in a call center.   Enjoys reading, sewing, spending time with her grandchildren.   family history includes Bladder Cancer in her maternal grandmother; Cervical cancer in her maternal aunt; Esophageal cancer in her maternal  grandmother; Hyperthyroidism in her mother; Hypothyroidism in her mother; Stroke in her maternal grandmother.   Review of Systems All other review of systems are negative at this time  Objective:   Physical Exam BP 120/70 (Cuff Size: Large)   Pulse 64   Ht 5\' 2"  (1.575 m)   Wt 257 lb 6.4 oz (116.8 kg)   BMI 47.08 kg/m  Obese elderly white The eyes are anicteric Lungs clear heart sounds are normal abd obese tender RLQNo masses or hepatosplenomegaly psychMood and affect are appropriate and normal neuroAlert and oriented 3 Extremities free of cyanosis clubbing and edema Tedra [place], CMA is present  Rectal - large fleshy tags neg anal wink dec tone rest and marked reduced squeeze and minimal relax descent  Anoscopy Gr 2 int hems  inflamed all 3 positions

## 2017-01-21 NOTE — Patient Instructions (Addendum)
  We are ordering a Breath Test to be delivered and done at your home.   You will be contacted about physical therapy for pelvic floor dysfunction .  This is done at [facility] [place].  There phone # is [phone].   Follow up with [place] in 2 months, please call back to set this up.   We are going to get your records from [facility] for Dr Carlean Purl to review.   You have been scheduled for a CT scan of the abdomen and pelvis at [facility] ([address]---this is in the same building as Press [facility]).   You are scheduled on 01/28/17 at 9:30AM. You should arrive 15 minutes prior to your appointment time for registration. Please follow the written instructions below on the day of your exam:  WARNING: IF YOU ARE ALLERGIC TO IODINE/X-RAY DYE, PLEASE NOTIFY RADIOLOGY IMMEDIATELY AT [phone]! YOU WILL BE GIVEN A 13 HOUR PREMEDICATION PREP.  1) Do not eat or drink anything after 5:30AM (4 hours prior to your test) 2) You have been given 2 bottles of oral contrast to drink. The solution may taste  better if refrigerated, but do NOT add ice or any other liquid to this solution. Shake  well before drinking.    Drink 1 bottle of contrast @ 7:30AM (2 hours prior to your exam)  Drink 1 bottle of contrast @ 8:30AM (1 hour prior to your exam)  You may take any medications as prescribed with a small amount of water except for the following: Metformin, Glucophage, Glucovance, Avandamet, Riomet, Fortamet, Actoplus Met, Janumet, Glumetza or Metaglip. The above medications must be held the day of the exam AND 48 hours after the exam.  The purpose of you drinking the oral contrast is to aid in the visualization of your intestinal tract. The contrast solution may cause some diarrhea. Before your exam is started, you will be given a small amount of fluid to drink. Depending on your individual set of symptoms, you may also receive an intravenous injection of x-ray  contrast/dye. Plan on being at [facility] for 30 minutes or longer, depending on the type of exam you are having performed.  This test typically takes 30-45 minutes to complete.  If you have any questions regarding your exam or if you need to reschedule, you may call the CT department at [phone] between the hours of 8:00 am and 5:00 pm, Monday-Friday.  ________________________________________________________________________   Your physician has requested that you go to the basement for the lab work before leaving today.   I appreciate the opportunity to care for you. Silvano Rusk, MD, [facility]

## 2017-01-28 ENCOUNTER — Inpatient Hospital Stay: Admission: RE | Admit: 2017-01-28 | Payer: Self-pay | Source: Ambulatory Visit

## 2017-02-03 ENCOUNTER — Ambulatory Visit (INDEPENDENT_AMBULATORY_CARE_PROVIDER_SITE_OTHER)
Admission: RE | Admit: 2017-02-03 | Discharge: 2017-02-03 | Disposition: A | Discharge status: Home / Self Care | Payer: No Typology Code available for payment source | Source: Ambulatory Visit | Attending: Internal Medicine | Admitting: Internal Medicine

## 2017-02-03 DIAGNOSIS — R140 Abdominal distension (gaseous): Secondary | ICD-10-CM

## 2017-02-03 DIAGNOSIS — K582 Mixed irritable bowel syndrome: Secondary | ICD-10-CM | POA: Diagnosis not present

## 2017-02-03 DIAGNOSIS — R10813 Right lower quadrant abdominal tenderness: Secondary | ICD-10-CM

## 2017-02-03 MED ORDER — IOPAMIDOL (ISOVUE-300) INJECTION 61%
100.0000 mL | Freq: Once | INTRAVENOUS | Status: AC | PRN
  Administered 2017-02-03: 100 mL via INTRAVENOUS

## 2017-02-03 NOTE — Progress Notes (Signed)
Cystic lesion in right overy - it is good that seems stable over time (can be seen in prior spine MR) BUT needs GYN evaluation -   Does she have GYN - looks like saw Dr. Phineas Real in 2004 - can see him again or her choice   There is also GYN office in [place] I think - [facility] practice - so if she does not have GYN would see where she could get in sooner of these

## 2017-02-04 ENCOUNTER — Other Ambulatory Visit: Payer: Self-pay

## 2017-02-04 ENCOUNTER — Encounter: Payer: Self-pay | Admitting: Primary Care

## 2017-02-04 DIAGNOSIS — N736 Female pelvic peritoneal adhesions (postinfective): Secondary | ICD-10-CM

## 2017-02-12 ENCOUNTER — Other Ambulatory Visit: Payer: Self-pay | Admitting: Primary Care

## 2017-02-12 DIAGNOSIS — J3089 Other allergic rhinitis: Secondary | ICD-10-CM

## 2017-02-12 NOTE — Telephone Encounter (Signed)
Per DPR, left detail message of Kate's comments for patient to call back.  Received a refill request for Xyzal. However, this medication is not on patient's current medication list. Need to ask patient if taking or not.

## 2017-02-17 ENCOUNTER — Encounter: Payer: Self-pay | Admitting: Family Medicine

## 2017-02-17 ENCOUNTER — Ambulatory Visit (INDEPENDENT_AMBULATORY_CARE_PROVIDER_SITE_OTHER): Payer: No Typology Code available for payment source | Admitting: Family Medicine

## 2017-02-17 VITALS — BP 154/86 | HR 90 | Ht 62.0 in | Wt 258.0 lb

## 2017-02-17 DIAGNOSIS — N949 Unspecified condition associated with female genital organs and menstrual cycle: Secondary | ICD-10-CM | POA: Diagnosis not present

## 2017-02-17 DIAGNOSIS — Z8041 Family history of malignant neoplasm of ovary: Secondary | ICD-10-CM

## 2017-02-17 DIAGNOSIS — N9489 Other specified conditions associated with female genital organs and menstrual cycle: Secondary | ICD-10-CM

## 2017-02-17 NOTE — Patient Instructions (Signed)
Pelvic Mass A pelvic mass is an abnormal growth in the pelvis. The pelvis is the area between your hip bones. It includes the bladder and the rectum in males and females, and also the uterus and ovaries in females. What are the causes? Many things can cause a pelvic mass, including:  Cancer.  Fibroids of the uterus.  Ovarian cysts.  Infection.  Ectopic pregnancy.  What are the signs or symptoms? Symptoms of a pelvic mass may include:  Cramping.  Nausea.  Diarrhea.  Fever.  Vomiting.  Weakness.  Pain in the pelvis, side, or back.  Weight loss.  Constipation.  Problems with vaginal bleeding, including: ? Light or heavy bleeding with or without blood clots. ? Irregular menstruation. ? Pain with menstruation.  Problems with urination, including: ? Frequent urination. ? Inability to empty the bladder completely. ? Urinating very small amounts. ? Pain with urination. ? Bloody urine.  Some pelvic masses do not cause symptoms. How is this diagnosed? To make a diagnosis, your health care provider will need to learn more about the mass. You may have tests or procedures done, such as:  Blood tests.  X-rays.  Ultrasound.  CT scan.  MRI.  A surgery to look inside of your abdomen with cameras (laparoscopy).  A biopsy that is performed with a needle or during laparoscopy or surgery.  In some cases, what seemed like a pelvic mass may actually be something else, such as a mass in one of the organs that are near the pelvis, an infection (abscess) or scar tissue (adhesions) that formed after a surgery. How is this treated? Treatment will depend on the cause of the mass. Follow these instructions at home: What you need to do at home will depend on the cause of the mass. Follow the instructions that your health care provider gives to you. In general:  Keep all follow-up visits as directed by your health care provider. This is important.  Take medicines only as  directed by your health care provider.  Follow any restrictions that are given to you by your health care provider.  Contact a health care provider if:  You develop new symptoms. Get help right away if:  You vomit bright red blood or vomit material that looks like coffee grounds.  You have blood in your stools, or the stools turn black and tarry.  You have an abnormal or increased amount of vaginal bleeding.  You have a fever.  You develop easy bruising or bleeding.  You develop sudden or worsening pain that is not controlled by your medicine.  You feel worsening weakness, or you have a fainting episode.  You feel that the mass has suddenly gotten larger.  You develop severe bloating in your abdomen or your pelvis.  You cannot pass any urine.  You are unable to have a bowel movement. This information is not intended to replace advice given to you by your health care provider. Make sure you discuss any questions you have with your health care provider. Document Released: 07/15/2006 Document Revised: 09/13/2015 Document Reviewed: 11/21/2013 Elsevier Interactive Patient Education  2018 Elsevier Inc.  

## 2017-02-17 NOTE — Progress Notes (Signed)
   Subjective:    Patient ID: Destiny KOPE is a 66 y.o. female presenting with Gynecologic Exam  on 02/17/2017  HPI: Patient is referred from PCP and GI for findings of adnexal mass. Reports RLQ  pain x 2 years. Notes pain is worse with sneezing. Seems like it is getting worse. Was intermittent. Pain is now occurring more frequently. Notes pain is worse with eating and notes some bloating X 9 months and pressure sensation. Has family history of ovarian cancer--daughter affected at age 90 and is BRCA positive. Had a hysterectomy at age 80 due to fibroids with bleeding. Did not have her ovaries removed. Was being seen by GI for IBS and Barrett's esophagus and they ordered a CT which showed right adnexal mass.  Review of Systems  Constitutional: Negative for chills and fever.  Respiratory: Negative for shortness of breath.   Cardiovascular: Negative for chest pain.  Gastrointestinal: Positive for abdominal pain and nausea. Negative for vomiting.  Genitourinary: Negative for dysuria.  Skin: Negative for rash.      Objective:    BP (!) 154/86   Pulse 90   Ht '5\' 2"'$  (1.575 m)   Wt 258 lb (117 kg)   BMI 47.19 kg/m  Physical Exam  Constitutional: She is oriented to person, place, and time. She appears well-developed and well-nourished. No distress.  HENT:  Head: Normocephalic and atraumatic.  Eyes: No scleral icterus.  Neck: Neck supple.  Cardiovascular: Normal rate.   Pulmonary/Chest: Effort normal.  Abdominal: Soft.  Neurological: She is alert and oriented to person, place, and time.  Skin: Skin is warm and dry.  Psychiatric: She has a normal mood and affect.   CT 10/16--shows large right adnexal lesion 6.0 x 7.3. No ascites, omental caking or lymphadenopathy noted. Recommend pelvic sono to further characterize.     Assessment & Plan:   Problem List Items Addressed This Visit      Unprioritized   Adnexal mass    Stable on CT since MRI of Lumbar spine in 2016. Given  family history-->will check BRCA testing, CA 125 and attempt at pelvic sono--she is unsure if she can tolerate this--then would need MRI--if findings are worrisome would send to GYN/ONC. If everything appears benign could attempt laparoscopic removal. If BRCA is positive, would need BSO so will await that result.      Relevant Orders   CA 125 (Completed)   US PELVIS (TRANSABDOMINAL ONLY)   US PELVIS TRANSVANGINAL NON-OB (TV ONLY)   Family history of ovarian cancer - Primary   Relevant Orders   Integrated BRACAnalysis ([facility])     Total face-to-face time with patient: 20 minutes. Over 50% of encounter was spent on counseling and coordination of care. Return in about 4 weeks (around 03/17/2017).  Donnamae Jude 02/17/2017 10:48 AM

## 2017-02-18 LAB — CA 125: Cancer Antigen (CA) 125: 10 U/mL (ref 0.0–38.1)

## 2017-02-18 NOTE — Assessment & Plan Note (Signed)
Stable on CT since MRI of Lumbar spine in 2016. Given family history-->will check BRCA testing, CA 125 and attempt at pelvic sono--she is unsure if she can tolerate this--then would need MRI--if findings are worrisome would send to GYN/ONC. If everything appears benign could attempt laparoscopic removal. If BRCA is positive, would need BSO so will await that result.

## 2017-02-25 ENCOUNTER — Encounter: Payer: Self-pay | Admitting: Internal Medicine

## 2017-02-25 ENCOUNTER — Ambulatory Visit: Payer: No Typology Code available for payment source | Admitting: Internal Medicine

## 2017-02-25 VITALS — BP 130/84 | HR 100 | Wt 256.0 lb

## 2017-02-25 DIAGNOSIS — N949 Unspecified condition associated with female genital organs and menstrual cycle: Secondary | ICD-10-CM

## 2017-02-25 DIAGNOSIS — Z23 Encounter for immunization: Secondary | ICD-10-CM | POA: Diagnosis not present

## 2017-02-25 DIAGNOSIS — Z8349 Family history of other endocrine, nutritional and metabolic diseases: Secondary | ICD-10-CM | POA: Diagnosis not present

## 2017-02-25 DIAGNOSIS — N9489 Other specified conditions associated with female genital organs and menstrual cycle: Secondary | ICD-10-CM

## 2017-02-25 DIAGNOSIS — E119 Type 2 diabetes mellitus without complications: Secondary | ICD-10-CM | POA: Diagnosis not present

## 2017-02-25 NOTE — Patient Instructions (Signed)
Please come back for a follow-up appointment in 6 months.   

## 2017-02-25 NOTE — Progress Notes (Signed)
Patient ID: [name], female   DOB: March 25, 1951, 66 y.o.   MRN: [id number]    HPI  [name] is a 66 y.o.-year-old female, returning for f/u for DM2, mild, non-insulin-dependent, without complicationas. I previously saw her also to r/o thyroid ds. 2/2 FH of hyperthyroidism. His husband is also my pt, [name]. Last visit 6 mo ago.  Since last visit, she was found to have a pelvic mass. Presumed benign, but will have an U/S tomorrow. Daughter BRCA positive - pt's results are pending. CA 125 normal.   Also, B12 vitamin was low: 222 at last check.  She had walking PNA in 11/2016.  DM2: - Had 2 elevated HbA1c after steroids for back pain >> then started to decrease.  Last HbA1c: Lab Results  Component Value Date   HGBA1C 6.5 01/07/2017   HGBA1C 6.3 (H) 09/25/2016   HGBA1C 6.4 08/21/2016   HGBA1C 6.6 (H) 02/22/2016   HGBA1C 6.9 (H) 09/24/2015   Prev.: 5.9%  CBG's  - am: 72, 101-110 - 2h after b'fast: n/c - lunch: n/c - 2h after lunch: 126 - dinner: n/c - 2h after dinner: n/c (dinner late: gets off work at 7:30) - bedtime: n/c  Diet: - Breakfast: Sausage biscuit - Lunch: Meat loaf, fish, or pasta - Dinner: Small - Snacks: 1 a day: String cheese or piece of candy, 100-calorie snack  Lab Results  Component Value Date   BUN 14 01/21/2017   Lab Results  Component Value Date   CREATININE 0.79 01/21/2017   Lab Results  Component Value Date   CHOL 159 09/25/2016   HDL 49 09/25/2016   LDLCALC 96 09/25/2016   TRIG 70 09/25/2016   CHOLHDL 3.2 09/25/2016   Family history of thyroid disease:  I reviewed pt's thyroid tests: Lab Results  Component Value Date   TSH 2.470 09/25/2016   TSH 1.99 02/22/2016   TSH 3.380 09/24/2015   Lab Results  Component Value Date   FREET4 0.76 02/22/2016   T3FREE 3.5 02/22/2016   Thyroid Ab's normal: Component     Latest Ref Rng & Units 02/22/2016  Thyroglobulin Ab     <2 IU/mL <1  Thyroperoxidase Ab  SerPl-aCnc     <9 IU/mL 1   Pt denies: - feeling nodules in neck - hoarseness - choking - SOB with lying down  But has dysphagia (has Barrett's esophagus)  Pt does have a FH of thyroid ds. In mother: hyperthyroidism when pregnant with the pt. No FH of thyroid cancer. No h/o radiation tx to head or neck.  No seaweed or kelp. No recent contrast studies. No herbal supplements. No Biotin use. No recent steroids use.   Pt. was in the ED 12/19/2015 for a saddle PE. On Eloquis. She also has a history of back surgery 10/31/2015. Previous back surgery in 2005. She had multiple courses of steroids, not recently.   ROS: Constitutional: no weight gain/no weight loss, + fatigue, + subjective hyperthermia, + subjective hypothermia, + nocturia Eyes: no blurry vision, no xerophthalmia ENT: no sore throat, + see HPI Cardiovascular: no CP/no SOB/no palpitations/no leg swelling Respiratory: no cough/no SOB/no wheezing Gastrointestinal: + N/no V/+ D/no C/+ acid reflux Musculoskeletal: no muscle aches/no joint aches Skin: no rashes, no hair loss Neurological: no tremors/no numbness/no tingling/no dizziness  I reviewed pt's medications, allergies, PMH, social hx, family hx, and changes were documented in the history of present illness. Otherwise, unchanged from my initial visit note.   Past Medical History:  Diagnosis Date  . Abnormal thyroid function test   . Allergy   . Anemia    she attributes previously to fibroids, she declines  . Barrett's esophagus   . Blood transfusion without reported diagnosis   . DDD (degenerative disc disease), cervical   . DJD (degenerative joint disease)   . GERD (gastroesophageal reflux disease)   . Heart murmur   . Hx of adenomatous polyp of colon 03/01/2002  . OA (osteoarthritis)   . Obese   . Paroxysmal atrial fibrillation (HCC)   . PE (pulmonary embolism) 12/21/2015  . Plantar fasciitis   . Type 2 diabetes mellitus (HCC)    diet controlled   Past  Surgical History:  Procedure Laterality Date  . ABDOMINAL HYSTERECTOMY    . Arthroscopic surgery left knee    . BACK SURGERY    . CESAREAN SECTION    . CHOLECYSTECTOMY    . COLONOSCOPY  2003, 2011   3 mm adenoma 2011  . ESOPHAGOGASTRODUODENOSCOPY  multiple since 2003  . IR GENERIC HISTORICAL  12/19/2015   IR ANGIOGRAM SELECTIVE EACH ADDITIONAL VESSEL 12/19/2015 [name], MD [facility]  . IR GENERIC HISTORICAL  12/19/2015   IR ANGIOGRAM PULMONARY BILATERAL SELECTIVE 12/19/2015 [name], MD [facility]  . IR GENERIC HISTORICAL  12/19/2015   IR INFUSION THROMBOL ARTERIAL INITIAL (MS) 12/19/2015 [name], MD [facility]  . IR GENERIC HISTORICAL  12/19/2015   IR US GUIDE VASC ACCESS RIGHT 12/19/2015 [name], MD [facility]  . IR GENERIC HISTORICAL  12/19/2015   IR ANGIOGRAM SELECTIVE EACH ADDITIONAL VESSEL 12/19/2015 [name], MD [facility]  . IR GENERIC HISTORICAL  12/20/2015   IR THROMB F/U EVAL ART/VEN FINAL DAY (MS) 12/20/2015 Gilmer Mor, DO [facility]  . IR GENERIC HISTORICAL  12/19/2015   IR INFUSION THROMBOL ARTERIAL INITIAL (MS) 12/19/2015 [name], MD [facility]  . Right knee replacement    . SPINAL FUSION  10/31/2015   revision at [facility]   . TONSILLECTOMY    . VENOUS ABLATION     x 2   Social History   Socioeconomic History  . Marital status: Married    Spouse name: Not on file  . Number of children: 3  . Years of education: Not on file  . Highest education level: Not on file  Social Needs  . Financial resource strain: Not on file  . Food insecurity - worry: Not on file  . Food insecurity - inability: Not on file  . Transportation needs - medical: Not on file  . Transportation needs - non-medical: Not on file  Occupational History    Comment: Telephone sales  Tobacco Use  . Smoking status: Never Smoker  . Smokeless tobacco: Never Used  Substance and Sexual Activity  . Alcohol use: No  . Drug use: No  . Sexual  activity: No    Partners: Male    Birth control/protection: Surgical    Comment: Hysterectomy  Other Topics Concern  . Not on file  Social History Narrative   Married.   3 children, 2 grandchildren.   Work's in a call center.   Enjoys reading, sewing, spending time with her grandchildren.   Current Outpatient Medications on File Prior to Visit  Medication Sig Dispense Refill  . aspirin EC 81 MG tablet Take 81 mg by mouth daily.    . Cholecalciferol (VITAMIN D-3) 1000 units CAPS Take 1,000 Units by mouth every evening.     . fluticasone (FLONASE)  50 MCG/ACT nasal spray Place 1 spray into both nostrils 2 (two) times daily. 16 g 0  . ondansetron (ZOFRAN ODT) 4 MG disintegrating tablet Take 1 tablet (4 mg total) by mouth every 8 (eight) hours as needed for nausea or vomiting. 20 tablet 0  . pantoprazole (PROTONIX) 40 MG tablet Take 1 tablet (40 mg total) by mouth daily before breakfast. 90 tablet 3   No current facility-administered medications on file prior to visit.    Allergies  Allergen Reactions  . Ciprofloxacin Anaphylaxis  . Doxycycline Anaphylaxis  . Strawberry Extract Anaphylaxis  . Tetracyclines & Related Anaphylaxis  . Amoxicillin Other (See Comments)    Has patient had a PCN reaction causing immediate rash, facial/tongue/throat swelling, SOB or lightheadedness with hypotension: No Has patient had a PCN reaction causing severe rash involving mucus membranes or skin necrosis: No Has patient had a PCN reaction that required hospitalization: No Has patient had a PCN reaction occurring within the last 10 years: Yes If all of the above answers are "NO", then may proceed with Cephalosporin use.   Affected her AFIB   Family History  Problem Relation Age of Onset  . Stroke Maternal Grandmother   . Bladder Cancer Maternal Grandmother 50  . Esophageal cancer Maternal Grandmother 50  . Hyperthyroidism Mother        Radioactive Iodine Treatment  . Hypothyroidism Mother   .  Cervical cancer Maternal Aunt 50  . Ovarian cancer Daughter 42  . Colon cancer Neg Hx   . Pancreatic cancer Neg Hx   . Rectal cancer Neg Hx   . Stomach cancer Neg Hx     PE: BP 130/84 (BP Location: Left Arm, Patient Position: Sitting)   Pulse 100   Wt 256 lb (116.1 kg)   SpO2 97%   BMI 46.82 kg/m  Wt Readings from Last 3 Encounters:  02/25/17 256 lb (116.1 kg)  02/17/17 258 lb (117 kg)  01/21/17 257 lb 6.4 oz (116.8 kg)   Constitutional: obese, in NAD Eyes: PERRLA, EOMI, no exophthalmos ENT: moist mucous membranes, no thyromegaly, no cervical lymphadenopathy Cardiovascular: tachycardia, RR, No MRG Respiratory: CTA B Gastrointestinal: abdomen soft, NT, ND, BS+ Musculoskeletal: no deformities, strength intact in all 4 Skin: moist, warm, no rashes Neurological: no tremor with outstretched hands, DTR normal in all 4  ASSESSMENT: 1. Family history of Hypothyroidism  2. Non-insulin diabetes type 2, w/o long term complication  3. Pelvic mass   PLAN:  1. Patient has a family history of hyperthyroidism in mother - reviewed latest TSH from 09/2016 >> normal - also, anti-thyroid Ab's were normal - no goiter or thyroid nodules felt by pt or palpable  2. Non-insulin diabetes type 2, w/o complication - Patient With slightly elevated HbA1c, fluctuating around the threshold between prediabetes and diabetes - reviewed together latest HbA1c which was slightly higher, at 6.5% - we discussed that no medication is needed for now - continue checking sugars at different times of the day - check 1x a day, rotating checks >> let me know if sugars increase - advised for yearly eye exams >> she is UTD - + flu shot today - Return to clinic in 6 mo with sugar log   3. Pelvic mass - reviewed chart along with pt >> summarized tests in HPI - pending final dx - pt is anxious  Carlus Pavlov, MD PhD [facility]

## 2017-02-26 ENCOUNTER — Ambulatory Visit (HOSPITAL_COMMUNITY)
Admission: RE | Admit: 2017-02-26 | Discharge: 2017-02-26 | Disposition: A | Discharge status: Home / Self Care | Payer: No Typology Code available for payment source | Source: Ambulatory Visit | Attending: Family Medicine | Admitting: Family Medicine

## 2017-02-26 DIAGNOSIS — N9489 Other specified conditions associated with female genital organs and menstrual cycle: Secondary | ICD-10-CM

## 2017-02-26 DIAGNOSIS — R1909 Other intra-abdominal and pelvic swelling, mass and lump: Secondary | ICD-10-CM | POA: Diagnosis not present

## 2017-02-26 DIAGNOSIS — N949 Unspecified condition associated with female genital organs and menstrual cycle: Secondary | ICD-10-CM | POA: Diagnosis present

## 2017-03-05 ENCOUNTER — Encounter: Payer: Self-pay | Admitting: *Deleted

## 2017-03-09 ENCOUNTER — Telehealth: Payer: Self-pay

## 2017-03-09 NOTE — Telephone Encounter (Signed)
Left her a message to call me back .  Looks like the PT people called her from [place] office and left her a message 01/26/17 at 3:02pm

## 2017-03-09 NOTE — Telephone Encounter (Signed)
-----   Message from Gatha Mayer, MD sent at 03/06/2017  8:21 AM EST ----- Regarding: PT referral Please f/u re: pelvic PT referral

## 2017-03-11 ENCOUNTER — Encounter: Payer: Self-pay | Admitting: Family Medicine

## 2017-03-11 ENCOUNTER — Ambulatory Visit (INDEPENDENT_AMBULATORY_CARE_PROVIDER_SITE_OTHER): Payer: No Typology Code available for payment source | Admitting: Family Medicine

## 2017-03-11 VITALS — BP 134/84 | HR 75 | Wt 260.5 lb

## 2017-03-11 DIAGNOSIS — N949 Unspecified condition associated with female genital organs and menstrual cycle: Secondary | ICD-10-CM | POA: Diagnosis not present

## 2017-03-11 DIAGNOSIS — N9489 Other specified conditions associated with female genital organs and menstrual cycle: Secondary | ICD-10-CM

## 2017-03-11 NOTE — Assessment & Plan Note (Signed)
Lengthy conversation with patient and husband about risks of surgery. We discussed her prior surgeries, adhesions, risk of death, injury to bowels and possible colostomy, risk of injury to ureters. Also has h/o PE following surgery and is concerned about a repeat of this as well. Given that this is most likely benign, the only reason for intervention is due to pain. After consultation with family and IR, we have opted for CT aspiration with then continued f/u imaging following this. Risk of recurrence discussed. Due to risks of surgery, it is a reasonable alternative. Surgery may still be inevitable. Would attempt RUQ entry into the abdomen if necessary.

## 2017-03-11 NOTE — Progress Notes (Signed)
   Subjective:    Patient ID: [name] is a 66 y.o. female presenting with Follow-up  on 03/11/2017  HPI: Here today for f/u. Has had ongoing RLQ pain x 2 years. A CT showed an ovarian process. She has undergone pelvic sono which showed and simple cyst 6.7 x 5.0 x 6.5 and likely present for last 2 years at least, has had a negative CA-125 and negative BRCA testing (her daughter is +). She has had a vertical lower abdominal laparotomy and a Pfannenstiel incision and gallbladder removal. Following her last major surgery, she developed a DVT.  Review of Systems  Constitutional: Negative for chills and fever.  Respiratory: Negative for shortness of breath.   Cardiovascular: Negative for chest pain.  Gastrointestinal: Negative for abdominal pain, nausea and vomiting.  Genitourinary: Negative for dysuria.  Skin: Negative for rash.      Objective:    BP 134/84   Pulse 75   Wt 260 lb 8 oz (118.2 kg)   BMI 47.65 kg/m  Physical Exam  Constitutional: She is oriented to person, place, and time. She appears well-developed and well-nourished. No distress.  HENT:  Head: Normocephalic and atraumatic.  Eyes: No scleral icterus.  Neck: Neck supple.  Cardiovascular: Normal rate.  Pulmonary/Chest: Effort normal.  Abdominal: Soft.  Neurological: She is alert and oriented to person, place, and time.  Skin: Skin is warm and dry.  Psychiatric: She has a normal mood and affect.      Assessment & Plan:   Problem List Items Addressed This Visit      Unprioritized   Adnexal mass - Primary    Lengthy conversation with patient and husband about risks of surgery. We discussed her prior surgeries, adhesions, risk of death, injury to bowels and possible colostomy, risk of injury to ureters. Also has h/o PE following surgery and is concerned about a repeat of this as well. Given that this is most likely benign, the only reason for intervention is due to pain. After consultation with family and  IR, we have opted for CT aspiration with then continued f/u imaging following this. Risk of recurrence discussed. Due to risks of surgery, it is a reasonable alternative. Surgery may still be inevitable. Would attempt RUQ entry into the abdomen if necessary.      Relevant Orders   CT ASPIRATION      Total face-to-face time with patient: 25 minutes. Over 50% of encounter was spent on counseling and coordination of care. Return in about 3 months (around 06/11/2017).  Donnamae Jude 03/11/2017 10:52 AM

## 2017-03-20 NOTE — Telephone Encounter (Signed)
Spoke with [name] and she said she didn't make a PT appointment because she doesn't have any time to go.

## 2017-03-30 ENCOUNTER — Other Ambulatory Visit: Payer: Self-pay | Admitting: Radiology

## 2017-03-31 ENCOUNTER — Ambulatory Visit (HOSPITAL_COMMUNITY): Payer: No Typology Code available for payment source

## 2017-04-02 ENCOUNTER — Other Ambulatory Visit: Payer: Self-pay | Admitting: General Surgery

## 2017-04-03 ENCOUNTER — Ambulatory Visit (HOSPITAL_COMMUNITY)
Admission: RE | Admit: 2017-04-03 | Discharge: 2017-04-03 | Disposition: A | Discharge status: Home / Self Care | Payer: No Typology Code available for payment source | Source: Ambulatory Visit | Attending: Family Medicine | Admitting: Family Medicine

## 2017-04-03 ENCOUNTER — Encounter (HOSPITAL_COMMUNITY): Payer: Self-pay

## 2017-04-03 DIAGNOSIS — E119 Type 2 diabetes mellitus without complications: Secondary | ICD-10-CM | POA: Insufficient documentation

## 2017-04-03 DIAGNOSIS — Z7982 Long term (current) use of aspirin: Secondary | ICD-10-CM | POA: Diagnosis not present

## 2017-04-03 DIAGNOSIS — I480 Paroxysmal atrial fibrillation: Secondary | ICD-10-CM | POA: Insufficient documentation

## 2017-04-03 DIAGNOSIS — M5030 Other cervical disc degeneration, unspecified cervical region: Secondary | ICD-10-CM | POA: Diagnosis not present

## 2017-04-03 DIAGNOSIS — N9489 Other specified conditions associated with female genital organs and menstrual cycle: Secondary | ICD-10-CM

## 2017-04-03 DIAGNOSIS — K219 Gastro-esophageal reflux disease without esophagitis: Secondary | ICD-10-CM | POA: Diagnosis not present

## 2017-04-03 DIAGNOSIS — Z86711 Personal history of pulmonary embolism: Secondary | ICD-10-CM | POA: Insufficient documentation

## 2017-04-03 DIAGNOSIS — Z6841 Body Mass Index (BMI) 40.0 and over, adult: Secondary | ICD-10-CM | POA: Diagnosis not present

## 2017-04-03 DIAGNOSIS — M1990 Unspecified osteoarthritis, unspecified site: Secondary | ICD-10-CM | POA: Insufficient documentation

## 2017-04-03 DIAGNOSIS — E669 Obesity, unspecified: Secondary | ICD-10-CM | POA: Diagnosis not present

## 2017-04-03 DIAGNOSIS — N949 Unspecified condition associated with female genital organs and menstrual cycle: Secondary | ICD-10-CM | POA: Diagnosis not present

## 2017-04-03 DIAGNOSIS — Z880 Allergy status to penicillin: Secondary | ICD-10-CM | POA: Diagnosis not present

## 2017-04-03 LAB — CBC
HCT: 43.1 % (ref 36.0–46.0)
Hemoglobin: 14.2 g/dL (ref 12.0–15.0)
MCH: 27.2 pg (ref 26.0–34.0)
MCHC: 32.9 g/dL (ref 30.0–36.0)
MCV: 82.6 fL (ref 78.0–100.0)
Platelets: 236 10*3/uL (ref 150–400)
RBC: 5.22 MIL/uL — ABNORMAL HIGH (ref 3.87–5.11)
RDW: 13.4 % (ref 11.5–15.5)
WBC: 6.7 10*3/uL (ref 4.0–10.5)

## 2017-04-03 LAB — APTT: aPTT: 28 seconds (ref 24–36)

## 2017-04-03 LAB — PROTIME-INR
INR: 1
Prothrombin Time: 13.1 seconds (ref 11.4–15.2)

## 2017-04-03 MED ORDER — MIDAZOLAM HCL 2 MG/2ML IJ SOLN
INTRAMUSCULAR | Status: AC | PRN
  Administered 2017-04-03 (×2): 1 mg via INTRAVENOUS

## 2017-04-03 MED ORDER — MIDAZOLAM HCL 2 MG/2ML IJ SOLN
INTRAMUSCULAR | Status: AC
  Filled 2017-04-03: qty 4

## 2017-04-03 MED ORDER — FENTANYL CITRATE (PF) 100 MCG/2ML IJ SOLN
INTRAMUSCULAR | Status: AC | PRN
  Administered 2017-04-03 (×2): 50 ug via INTRAVENOUS

## 2017-04-03 MED ORDER — SODIUM CHLORIDE 0.9 % IV SOLN
INTRAVENOUS | Status: DC
  Administered 2017-04-03: 10:00:00 via INTRAVENOUS

## 2017-04-03 MED ORDER — FENTANYL CITRATE (PF) 100 MCG/2ML IJ SOLN
INTRAMUSCULAR | Status: AC
  Filled 2017-04-03: qty 4

## 2017-04-03 MED ORDER — LIDOCAINE HCL 1 % IJ SOLN
INTRAMUSCULAR | Status: AC
  Filled 2017-04-03: qty 20

## 2017-04-03 NOTE — Sedation Documentation (Signed)
Patient is resting comfortably. 

## 2017-04-03 NOTE — Procedures (Signed)
RLQ ovarian cyst aspiration 100 clear yellow fluid EBL 0 Comp 0

## 2017-04-03 NOTE — Sedation Documentation (Signed)
Patient denies pain and is resting comfortably.  

## 2017-04-03 NOTE — Discharge Instructions (Signed)
Needle Biopsy, Care After °These instructions give you information about caring for yourself after your procedure. Your doctor may also give you more specific instructions. Call your doctor if you have any problems or questions after your procedure. °Follow these instructions at home: °· Rest as told by your doctor. °· Take medicines only as told by your doctor. °· There are many different ways to close and cover the biopsy site, including stitches (sutures), skin glue, and adhesive strips. Follow instructions from your doctor about: °? How to take care of your biopsy site. °? When and how you should change your bandage (dressing). °? When you should remove your dressing. °? Removing whatever was used to close your biopsy site. °· Check your biopsy site every day for signs of infection. Watch for: °? Redness, swelling, or pain. °? Fluid, blood, or pus. °Contact a doctor if: °· You have a fever. °· You have redness, swelling, or pain at the biopsy site, and it lasts longer than a few days. °· You have fluid, blood, or pus coming from the biopsy site. °· You feel sick to your stomach (nauseous). °· You throw up (vomit). °Get help right away if: °· You are short of breath. °· You have trouble breathing. °· Your chest hurts. °· You feel dizzy or you pass out (faint). °· You have bleeding that does not stop with pressure or a bandage. °· You cough up blood. °· Your belly (abdomen) hurts. °This information is not intended to replace advice given to you by your health care provider. Make sure you discuss any questions you have with your health care provider. °Document Released: 03/20/2008 Document Revised: 09/13/2015 Document Reviewed: 04/03/2014 °Elsevier Interactive Patient Education © 2018 [employer] ° °

## 2017-04-03 NOTE — H&P (Signed)
Chief Complaint: Patient was seen in consultation today for right ovarian cyst aspiration/biopsy at the request of Pratt,Tanya S  Referring Physician(s): Donnamae Jude  Supervising Physician: Marybelle Killings  Patient Status: [facility] - Out-pt  History of Present Illness: [name] is a 66 y.o. female   Dr Kennon Rounds note 03/11/17: Has had ongoing RLQ pain x 2 years. A CT showed an ovarian process. She has undergone pelvic sono which showed and simple cyst 6.7 x 5.0 x 6.5 and likely present for last 2 years at least, has had a negative CA-125 and negative BRCA testing (her daughter is +). She has had a vertical lower abdominal laparotomy and a Pfannenstiel incision and gallbladder removal. Following her last major surgery, she developed a DVT.  Still with RLQ pain most noted with sneezing or val salva maneuver Intermittent for 2 yrs or more.  Pt presents today for aspiration per Dr Kennon Rounds Poor surgical candidate Procedure approved by Dr Laurence Ferrari  Past Medical History:  Diagnosis Date  . Abnormal thyroid function test   . Allergy   . Anemia    she attributes previously to fibroids, she declines  . Barrett's esophagus   . Blood transfusion without reported diagnosis   . DDD (degenerative disc disease), cervical   . DJD (degenerative joint disease)   . GERD (gastroesophageal reflux disease)   . Heart murmur   . Hx of adenomatous polyp of colon 03/01/2002  . OA (osteoarthritis)   . Obese   . Paroxysmal atrial fibrillation (HCC)   . PE (pulmonary embolism) 12/21/2015  . Plantar fasciitis   . Type 2 diabetes mellitus (HCC)    diet controlled    Past Surgical History:  Procedure Laterality Date  . ABDOMINAL HYSTERECTOMY    . Arthroscopic surgery left knee    . BACK SURGERY    . CESAREAN SECTION    . CHOLECYSTECTOMY    . COLONOSCOPY  2003, 2011   3 mm adenoma 2011  . ESOPHAGOGASTRODUODENOSCOPY  multiple since 2003  . IR GENERIC HISTORICAL  12/19/2015   IR ANGIOGRAM  SELECTIVE EACH ADDITIONAL VESSEL 12/19/2015 Greggory Keen, MD [facility]  . IR GENERIC HISTORICAL  12/19/2015   IR ANGIOGRAM PULMONARY BILATERAL SELECTIVE 12/19/2015 Greggory Keen, MD [facility]  . IR GENERIC HISTORICAL  12/19/2015   IR INFUSION THROMBOL ARTERIAL INITIAL (MS) 12/19/2015 Greggory Keen, MD [facility]  . IR GENERIC HISTORICAL  12/19/2015   IR US GUIDE VASC ACCESS RIGHT 12/19/2015 Greggory Keen, MD [facility]  . IR GENERIC HISTORICAL  12/19/2015   IR ANGIOGRAM SELECTIVE EACH ADDITIONAL VESSEL 12/19/2015 Greggory Keen, MD [facility]  . IR GENERIC HISTORICAL  12/20/2015   IR THROMB F/U EVAL ART/VEN FINAL DAY (MS) 12/20/2015 Corrie Mckusick, DO [facility]  . IR GENERIC HISTORICAL  12/19/2015   IR INFUSION THROMBOL ARTERIAL INITIAL (MS) 12/19/2015 Greggory Keen, MD [facility]  . Right knee replacement    . SPINAL FUSION  10/31/2015   revision at [place]    . VENOUS ABLATION     x 2    Allergies: Ciprofloxacin; Doxycycline; Strawberry extract; Tetracyclines & related; and Amoxicillin  Medications: Prior to Admission medications   Medication Sig Start Date End Date Taking? Authorizing Provider  aspirin EC 81 MG tablet Take 81 mg by mouth daily.   Yes [provider]  Cholecalciferol (VITAMIN D-3) 1000 units CAPS Take 1,000 Units by mouth every evening.    Yes [provider]  ondansetron (ZOFRAN ODT) 4 MG disintegrating tablet Take 1 tablet (4 mg total) by mouth every 8 (eight) hours as needed for nausea or vomiting. 06/18/16  Yes Pleas Koch, NP  pantoprazole (PROTONIX) 40 MG tablet Take 1 tablet (40 mg total) by mouth daily before breakfast. 01/06/17  Yes Gatha Mayer, MD  fluticasone (FLONASE) 50 MCG/ACT nasal spray Place 1 spray into both nostrils 2 (two) times daily. 11/12/16   Pleas Koch, NP     Family History  Problem Relation Age of Onset  . Stroke Maternal Grandmother   . Bladder Cancer Maternal  Grandmother 37  . Esophageal cancer Maternal Grandmother 58  . Hyperthyroidism Mother        Radioactive Iodine Treatment  . Hypothyroidism Mother   . Cervical cancer Maternal Aunt 50  . Ovarian cancer Daughter 90  . Colon cancer Neg Hx   . Pancreatic cancer Neg Hx   . Rectal cancer Neg Hx   . Stomach cancer Neg Hx     Social History   Socioeconomic History  . Marital status: Married    Spouse name: None  . Number of children: 3  . Years of education: None  . Highest education level: None  Social Needs  . Financial resource strain: None  . Food insecurity - worry: None  . Food insecurity - inability: None  . Transportation needs - medical: None  . Transportation needs - non-medical: None  Occupational History    Comment: Telephone sales  Tobacco Use  . Smoking status: Never Smoker  . Smokeless tobacco: Never Used  Substance and Sexual Activity  . Alcohol use: No  . Drug use: No  . Sexual activity: No    Partners: Male    Birth control/protection: Surgical    Comment: Hysterectomy  Other Topics Concern  . None  Social History Narrative   Married.   3 children, 2 grandchildren.   Work's in a call center.   Enjoys reading, sewing, spending time with her grandchildren.    Review of Systems: A 12 point ROS discussed and pertinent positives are indicated in the HPI above.  All other systems are negative.  Review of Systems  Constitutional: Negative for activity change, fatigue and fever.  Respiratory: Negative for shortness of breath.   Cardiovascular: Negative for chest pain.  Gastrointestinal: Positive for abdominal pain. Negative for nausea.  Musculoskeletal: Negative for gait problem.  Neurological: Negative for weakness.  Psychiatric/Behavioral: Negative for behavioral problems and confusion.    Vital Signs: BP 125/64   Pulse 74   Temp 98 F (36.7 C)   Ht '5\' 2"'  (1.575 m)   Wt 254 lb (115.2 kg)   SpO2 99%   BMI 46.46 kg/m   Physical Exam    Constitutional: She is oriented to person, place, and time.  Cardiovascular: Normal rate and regular rhythm.  Pulmonary/Chest: Effort normal and breath sounds normal.  Abdominal: Soft. There is tenderness.  Musculoskeletal: Normal range of motion.  Neurological: She is alert and oriented to person, place, and time.  Skin: Skin is warm and dry.  Psychiatric: She has a normal mood and affect. Her behavior is normal. Judgment and thought content normal.  Nursing note and vitals reviewed.   Imaging: No results found.  Labs:  CBC: Recent Labs    08/13/16 1140 09/25/16 0846 11/27/16 1131 04/03/17 0857  WBC 6.5 5.0 7.5 6.7  HGB 13.1 13.1 13.0 14.2  HCT 39.4 40.0 40.5 43.1  PLT 252 218 216 236  COAGS: No results for input(s): INR, APTT in the last 8760 hours.  BMP: Recent Labs    08/13/16 1140 09/25/16 0846 11/27/16 1131 12/03/16 1302 01/21/17 1231  NA 141 144 138 139  --   K 4.3 4.7 3.6 3.9  --   CL 101 105 108 105  --   CO2 24  --  19* 28  --   GLUCOSE 100* 106* 110* 95  --   BUN '12 14 17 17 14  ' CALCIUM 9.5 9.2 9.3 9.2  --   CREATININE 0.76 0.71 0.85 0.76 0.79  GFRNONAA 83 90 >60  --   --   GFRAA 95 103 >60  --   --     LIVER FUNCTION TESTS: Recent Labs    09/25/16 0846  BILITOT 0.7  AST 16  ALT 12  ALKPHOS 77  PROT 6.7  ALBUMIN 4.0    TUMOR MARKERS: No results for input(s): AFPTM, CEA, CA199, CHROMGRNA in the last 8760 hours.  Assessment and Plan:  Right ovarian cyst RLQ pain x 2 yrs or more Scheduled for right ovarian cyst aspiration/biopsy Risks and benefits discussed with the patient including, but not limited to bleeding, infection, damage to adjacent structures or low yield requiring additional tests. All of the patient's questions were answered, patient is agreeable to proceed. Consent signed and in chart.  Thank you for this interesting consult.  I greatly enjoyed meeting ALAYNNA KERWOOD and look forward to participating in their  care.  A copy of this report was sent to the requesting provider on this date.  Electronically Signed: Lavonia Drafts, PA-C 04/03/2017, 9:32 AM   I spent a total of  30 Minutes   in face to face in clinical consultation, greater than 50% of which was counseling/coordinating care for right ovarian cyst

## 2017-04-08 LAB — AEROBIC/ANAEROBIC CULTURE (SURGICAL/DEEP WOUND): Culture: NO GROWTH

## 2017-04-08 LAB — AEROBIC/ANAEROBIC CULTURE W GRAM STAIN (SURGICAL/DEEP WOUND): Gram Stain: NONE SEEN

## 2017-04-09 ENCOUNTER — Encounter: Payer: Self-pay | Admitting: Primary Care

## 2017-04-09 DIAGNOSIS — R7989 Other specified abnormal findings of blood chemistry: Secondary | ICD-10-CM

## 2017-04-10 ENCOUNTER — Ambulatory Visit (HOSPITAL_COMMUNITY): Payer: No Typology Code available for payment source

## 2017-04-30 ENCOUNTER — Ambulatory Visit: Payer: Self-pay | Admitting: Registered Nurse

## 2017-04-30 VITALS — BP 139/93 | HR 78 | Temp 98.5°F

## 2017-04-30 DIAGNOSIS — E119 Type 2 diabetes mellitus without complications: Secondary | ICD-10-CM

## 2017-04-30 DIAGNOSIS — N3001 Acute cystitis with hematuria: Secondary | ICD-10-CM

## 2017-04-30 LAB — POCT URINALYSIS DIPSTICK
Bilirubin, UA: NEGATIVE
Glucose, UA: NEGATIVE
Ketones, UA: NEGATIVE
Nitrite, UA: NEGATIVE
Protein Ur, POC: 30 mg/dL — AB
Spec Grav, UA: 1.025 (ref 1.010–1.025)
Urobilinogen, UA: 0.2 E.U./dL
pH, UA: 6 (ref 5.0–8.0)

## 2017-04-30 MED ORDER — AMOXICILLIN 875 MG PO TABS: 875.0000 mg | ORAL_TABLET | Freq: Two times a day (BID) | ORAL | 0 refills | Status: DC

## 2017-04-30 NOTE — Patient Instructions (Signed)
Proteinuria Proteinuria is when there is too much protein in the urine. Proteins are important for buildingmuscles and bones. Proteins are also needed to fight infections, help the blood clot, and keep body fluids in balance. Proteinuria may be mild and temporary, or it may be an early sign of kidney disease. The kidneys make urine. Healthy kidneys also keep substances like proteins from leaving the blood and ending up in the urine. Proteinuria may be a sign that the kidneys are not working well. What are the causes? Healthy kidneys have filters (glomeruli) that keep proteins out of the urine. Proteinuria may mean that the glomeruli are damaged. The main causes of this type of damage are:  Diabetes.  High blood pressure.  Other causes of kidney damage can also cause proteinuria, such as:  Diseases of the immune system, such as lupus, rheumatoid arthritis, sarcoidosis, and Goodpasture syndrome.  Heart disease or heart failure.  Kidney infection.  Certain cancers, including kidney cancer, lymphoma, leukemia, and multiple myeloma.  Amyloidosis. This is a disease that causes abnormal proteins to build up in body tissues.  Reactions to certain medicines, such as NSAIDs.  Injury (trauma) or poisons (toxins).  High blood pressure that occurs during pregnancy (preeclampsia).  Temporary proteinuria may result from conditions that put stress on the kidneys. These conditions usually do not cause kidney damage. They include:  Fever.  Exposure to cold or heat.  Emotional or physical stress.  Extreme exercise.  Standing for long periods of time.  What increases the risk? This condition is more likely to develop in people who:  Have diabetes.  Have high blood pressure.  Have heart disease or heart failure.  Have an immune disease, cancer, or other disease that affects the kidneys.  Have a family history of kidney disease.  Are 65 or older.  Are overweight.  Are of  African-American, American [place], Hispanic/Latino, or [place] descent.  Are pregnant.  Have an infection.  What are the signs or symptoms? Mild proteinuria may not cause symptoms. As more proteins enter the urine, symptoms of kidney disease may develop, such as:  Foamy urine.  Swelling of the face, abdomen, hands, legs, or feet (edema).  Needing to urinate frequently.  Fatigue.  Difficulty sleeping.  Dry and itchy skin.  Nausea and vomiting.  Muscle cramps.  Shortness of breath.  How is this diagnosed? Your health care provider can diagnose proteinuria with a urine test. You may have this test as part of a routine physical or because you have symptoms of kidney disease or risk factors for kidney disease. You may also have:  Blood tests to measure the level of a certain substance (creatinine) that increases with kidney disease.  Imaging tests of your kidney, such as a CT scan or an ultrasound, to look for signs of kidney damage.  How is this treated? If your proteinuria is mild or temporary, no treatment may be needed. Your health care provider may show you how to monitor the level of protein in your urine at home. Identifying proteinuria early is important so that the cause of the condition can be treated. Treatment depends on the cause of your proteinuria. Treatment may include:  Making diet and lifestyle changes.  Getting blood pressure under control.  Getting blood sugar under control, if you have diabetes.  Managing any other medical conditions you have that affect your kidneys.  Giving birth, if you are pregnant.  Avoiding medicines that damage your kidneys.  Treating kidney disease with medicine  and dialysis, as needed.  Follow these instructions at home:  Check your protein levels at home, if directed by your health care provider.  Follow instructions from your health care provider about eating or drinking restrictions.  Take over-the-counter  and prescription medicines only as told by your health care provider.  Return to your normal activities as told by your health care provider. Ask your health care provider what activities are safe for you.  If you are overweight, ask your health care provider to help you with a diet to get to a healthy weight.  Ask your health care provider to help you with an exercise program.  Keep all follow-up visits as told by your health care provider. This is important. Contact a health care provider if:  You have new symptoms.  Your symptoms get worse or do not improve. Get help right away if:  You have back pain.  You have diarrhea.  You vomit.  You have a fever.  You have a rash. This information is not intended to replace advice given to you by your health care provider. Make sure you discuss any questions you have with your health care provider. Document Released: 05/28/2005 Document Revised: 05/18/2015 Document Reviewed: 02/26/2015 Elsevier Interactive Patient Education  2018 [employer]. Hematuria, Adult Hematuria is blood in your urine. It can be caused by a bladder infection, kidney infection, prostate infection, kidney stone, or cancer of your urinary tract. Infections can usually be treated with medicine, and a kidney stone usually will pass through your urine. If neither of these is the cause of your hematuria, further workup to find out the reason may be needed. It is very important that you tell your health care provider about any blood you see in your urine, even if the blood stops without treatment or happens without causing pain. Blood in your urine that happens and then stops and then happens again can be a symptom of a very serious condition. Also, pain is not a symptom in the initial stages of many urinary cancers. Follow these instructions at home:  Drink lots of fluid, 3-4 quarts a day. If you have been diagnosed with an infection, cranberry juice is especially  recommended, in addition to large amounts of water.  Avoid caffeine, tea, and carbonated beverages because they tend to irritate the bladder.  Avoid alcohol because it may irritate the prostate.  Take all medicines as directed by your health care provider.  If you were prescribed an antibiotic medicine, finish it all even if you start to feel better.  If you have been diagnosed with a kidney stone, follow your health care provider's instructions regarding straining your urine to catch the stone.  Empty your bladder often. Avoid holding urine for long periods of time.  After a bowel movement, women should cleanse front to back. Use each tissue only once.  Empty your bladder before and after sexual intercourse if you are a female. Contact a health care provider if:  You develop back pain.  You have a fever.  You have a feeling of sickness in your stomach (nausea) or vomiting.  Your symptoms are not better in 3 days. Return sooner if you are getting worse. Get help right away if:  You develop severe vomiting and are unable to keep the medicine down.  You develop severe back or abdominal pain despite taking your medicines.  You begin passing a large amount of blood or clots in your urine.  You feel extremely weak or  faint, or you pass out. This information is not intended to replace advice given to you by your health care provider. Make sure you discuss any questions you have with your health care provider. Document Released: 04/07/2005 Document Revised: 09/13/2015 Document Reviewed: 12/06/2012 Elsevier Interactive Patient Education  2017 [employer] Urinary Tract Infection, Adult A urinary tract infection (UTI) is an infection of any part of the urinary tract. The urinary tract includes the:  Kidneys.  Ureters.  Bladder.  Urethra.  These organs make, store, and get rid of pee (urine) in the body. Follow these instructions at home:  Take over-the-counter and  prescription medicines only as told by your doctor.  If you were prescribed an antibiotic medicine, take it as told by your doctor. Do not stop taking the antibiotic even if you start to feel better.  Avoid the following drinks: ? Alcohol. ? Caffeine. ? Tea. ? Carbonated drinks.  Drink enough fluid to keep your pee clear or pale yellow.  Keep all follow-up visits as told by your doctor. This is important.  Make sure to: ? Empty your bladder often and completely. Do not to hold pee for long periods of time. ? Empty your bladder before and after sex. ? Wipe from front to back after a bowel movement if you are female. Use each tissue one time when you wipe. Contact a doctor if:  You have back pain.  You have a fever.  You feel sick to your stomach (nauseous).  You throw up (vomit).  Your symptoms do not get better after 3 days.  Your symptoms go away and then come back. Get help right away if:  You have very bad back pain.  You have very bad lower belly (abdominal) pain.  You are throwing up and cannot keep down any medicines or water. This information is not intended to replace advice given to you by your health care provider. Make sure you discuss any questions you have with your health care provider. Document Released: 09/24/2007 Document Revised: 09/13/2015 Document Reviewed: 02/26/2015 Elsevier Interactive Patient Education  [employer].

## 2017-04-30 NOTE — Progress Notes (Signed)
Subjective:    Patient ID: [name], female    DOB: 04/03/1951, 67 y.o.   MRN: [id number]  66y/o caucasian female established Pt c/o suprapubic pain and burning during urination x1 week. +urinary urgency, frequency, chills. Has not checked temp at home. Denies flank pain, blood in urine, vomiting. History of recurrent UTIs stated she has taken amoxicillin without side effects chart stating exacerbation afib with amoxicillin.  Patient reported she had ovarian cyst drained recently fluid taken off and was causing her to have palpable lump in abdomen and pain/nausea.  Still a little tender where needle was inserted but denied rash/drainage/bleeding right abdomen.  Thinks UTI is related to ovarian cyst procedure and/or caught a cold over the holidays and was run down.      Review of Systems  Constitutional: Positive for chills. Negative for activity change, appetite change, diaphoresis, fatigue and fever.  HENT: Negative for congestion, ear pain, sore throat, trouble swallowing and voice change.   Eyes: Negative for pain and discharge.  Respiratory: Negative for cough and wheezing.   Cardiovascular: Negative for chest pain and leg swelling.  Gastrointestinal: Positive for abdominal pain and nausea. Negative for blood in stool, constipation, diarrhea and vomiting.  Genitourinary: Positive for dysuria, frequency and urgency. Negative for difficulty urinating and hematuria.  Musculoskeletal: Negative for arthralgias, back pain, gait problem, joint swelling, myalgias, neck pain and neck stiffness.  Skin: Negative for color change, pallor, rash and wound.  Allergic/Immunologic: Positive for food allergies and immunocompromised state. Negative for environmental allergies.  Neurological: Negative for dizziness, tremors, seizures, syncope, facial asymmetry, speech difficulty, weakness, light-headedness, numbness and headaches.  Hematological: Negative for adenopathy. Does not bruise/bleed  easily.  Psychiatric/Behavioral: Negative for agitation, confusion and sleep disturbance.       Objective:   Physical Exam  Constitutional: She is oriented to person, place, and time. She appears well-developed and well-nourished. She is active and cooperative.  Non-toxic appearance. She does not have a sickly appearance. She does not appear ill. No distress.  HENT:  Head: Normocephalic and atraumatic.  Right Ear: Hearing and external ear normal.  Left Ear: Hearing and external ear normal.  Nose: No mucosal edema, rhinorrhea, nose lacerations, sinus tenderness, nasal deformity, septal deviation or nasal septal hematoma. No epistaxis.  No foreign bodies.  Mouth/Throat: Uvula is midline and mucous membranes are normal. Mucous membranes are not pale, not dry and not cyanotic. She does not have dentures. No oral lesions. No trismus in the jaw. Normal dentition. No dental abscesses, uvula swelling, lacerations or dental caries. No oropharyngeal exudate, posterior oropharyngeal edema, posterior oropharyngeal erythema or tonsillar abscesses.  Eyes: Conjunctivae, EOM and lids are normal. Pupils are equal, round, and reactive to light. Right eye exhibits no chemosis, no discharge, no exudate and no hordeolum. No foreign body present in the right eye. Left eye exhibits no chemosis, no discharge, no exudate and no hordeolum. No foreign body present in the left eye. Right conjunctiva is not injected. Right conjunctiva has no hemorrhage. Left conjunctiva is not injected. Left conjunctiva has no hemorrhage. No scleral icterus. Right eye exhibits normal extraocular motion and no nystagmus. Left eye exhibits normal extraocular motion and no nystagmus. Right pupil is round and reactive. Left pupil is round and reactive. Pupils are equal.  Neck: Trachea normal, normal range of motion and phonation normal. Neck supple. No tracheal tenderness and no muscular tenderness present. No neck rigidity. No tracheal deviation, no  edema, no erythema and normal range of motion  present. No thyroid mass and no thyromegaly present.  Cardiovascular: Normal rate, regular rhythm, S1 normal, S2 normal, normal heart sounds and intact distal pulses. PMI is not displaced. Exam reveals no gallop and no friction rub.  No murmur heard. Pulmonary/Chest: Effort normal and breath sounds normal. No accessory muscle usage or stridor. No respiratory distress. She has no decreased breath sounds. She has no wheezes. She has no rhonchi. She has no rales. She exhibits no tenderness.  Spoke full sentences without difficulty; no cough observed in exam room  Abdominal: Soft. Normal appearance. She exhibits no shifting dullness, no distension, no pulsatile liver, no fluid wave, no abdominal bruit, no ascites, no pulsatile midline mass and no mass. Bowel sounds are decreased. There is no hepatosplenomegaly. There is tenderness in the right upper quadrant and right lower quadrant. There is no rigidity, no rebound, no guarding, no CVA tenderness, no tenderness at McBurney's point and negative Murphy's sign. Hernia confirmed negative in the ventral area.  Dull to percussion x 4quads; hypoactive bowel sounds x 4 quads  Musculoskeletal: Normal range of motion. She exhibits no edema or tenderness.       Right shoulder: Normal.       Left shoulder: Normal.       Right elbow: Normal.      Left elbow: Normal.       Right hip: Normal.       Left hip: Normal.       Right knee: Normal.       Left knee: Normal.       Cervical back: Normal.       Thoracic back: Normal.       Lumbar back: Normal.       Right hand: Normal.       Left hand: Normal.  Lymphadenopathy:       Head (right side): No submental, no submandibular, no tonsillar, no preauricular, no posterior auricular and no occipital adenopathy present.       Head (left side): No submental, no submandibular, no tonsillar, no preauricular, no posterior auricular and no occipital adenopathy present.    She  has no cervical adenopathy.       Right cervical: No superficial cervical, no deep cervical and no posterior cervical adenopathy present.      Left cervical: No superficial cervical, no deep cervical and no posterior cervical adenopathy present.  Neurological: She is alert and oriented to person, place, and time. She has normal strength. She is not disoriented. She displays no atrophy and no tremor. No cranial nerve deficit or sensory deficit. She exhibits normal muscle tone. She displays no seizure activity. Coordination and gait normal. GCS eye subscore is 4. GCS verbal subscore is 5. GCS motor subscore is 6.  On/off exam table with assist; in/out of chair without difficulty; gait sure and steady in hallway  Skin: Skin is warm, dry and intact. No abrasion, no bruising, no burn, no ecchymosis, no laceration, no lesion, no petechiae and no rash noted. She is not diaphoretic. No cyanosis or erythema. No pallor. Nails show no clubbing.  Psychiatric: She has a normal mood and affect. Her speech is normal and behavior is normal. Judgment and thought content normal. Cognition and memory are normal.  Nursing note and vitals reviewed.  Discussed dipstick ua results with patient in clinic +RBC, leukocytes and protein patient verbalized understanding and had no further questions at this time.        Assessment & Plan:  A-acute UTI with hematuria  microscopic, proteinuria, type II diabetes and obesity  P-Per [facility] lab results hx group B strep on urine culture 2017 will treat with amoxicillin 875mg  po BID x 7 days #20 RF0 per up to date/lexicomp.   Urine cloudy with blood, protein and leukocytes which is typical of a urinary tract infection. Your last urine culture from [facility] in 2017 grew out group B strep which is treated with penicillin.May use azo bid x 2 days OTC for burning/bladder spasms patient refused pyridium Rx.  Keep pushing fluids to keep urine pale clear yellow; do not hold urine void  every 4-6 hours. If feeling worse after 48 hours on medication please seek re-evaluation especially if vomiting, urine cola/tea colored, worsening belly pain/back pain, fever or unable to pee every 8 hours.   Will repeat UA and send for urine culture if no improvement in symptoms with plan of care.  Recommend repeat ua in 2 weeks to ensure ua returns to baseline.  May drink cranberry juice to help clear bacteria from urinary system.  Exitcare handouts on UTI, proteinuria, hematuria.  Patient verbalized understanding information/instructions, agreed with plan of care and had no further questions at this time.

## 2017-05-04 NOTE — Progress Notes (Signed)
Spoke with pt this morning via phone. She reports still having some burning with urination and frequent urination but no sx as severe as previous, overall improving, but feels abx need a couple of more days for complete improvement. Off work [employer] and Wed. Appt made for f/u Thurs morning. If feeling better by then with resolved sx, will cx appt. Pt agreeable to plan. No further questions/concerns.

## 2017-05-07 ENCOUNTER — Ambulatory Visit: Payer: Self-pay | Admitting: Registered Nurse

## 2017-05-07 VITALS — BP 132/82 | HR 72 | Temp 97.8°F

## 2017-05-07 DIAGNOSIS — N3001 Acute cystitis with hematuria: Secondary | ICD-10-CM

## 2017-05-07 DIAGNOSIS — H04123 Dry eye syndrome of bilateral lacrimal glands: Secondary | ICD-10-CM

## 2017-05-07 DIAGNOSIS — R300 Dysuria: Secondary | ICD-10-CM

## 2017-05-07 LAB — POCT URINALYSIS DIPSTICK
Glucose, UA: NEGATIVE
Ketones, UA: NEGATIVE
Nitrite, UA: NEGATIVE
Spec Grav, UA: 1.015 (ref 1.010–1.025)
Urobilinogen, UA: 0.2 E.U./dL
pH, UA: 6 (ref 5.0–8.0)

## 2017-05-07 MED ORDER — CARBOXYMETHYLCELLULOSE SODIUM 1 % OP SOLN: 1.0000 [drp] | Freq: Three times a day (TID) | OPHTHALMIC | 12 refills | Status: AC

## 2017-05-07 MED ORDER — SULFAMETHOXAZOLE-TRIMETHOPRIM 800-160 MG PO TABS: 1.0000 | ORAL_TABLET | Freq: Two times a day (BID) | ORAL | 0 refills | Status: AC

## 2017-05-07 NOTE — Progress Notes (Signed)
Subjective:    Patient ID: [name], female    DOB: 08/09/1950, 67 y.o.   MRN: [id number]  66y/o married caucasian female established Pt reports continued sx of uti after completing amoxicillin 875mg  by mouth twice a day since 10 Jan. Reports pain not as bad as previous but still present during urination. Reports fevers of 100 that last for 10 minutes then break but are occurring more often, like a few times a day.  Pt reports her husband is requesting she be checked for pink eye because her eyes have been very red the last few days. She believes it is r/t tiredness 2/2 insomnia and looking at computer screen all day at work. Denies pain or drainage of eyes. Denied visual changes/discharge/matting/eye pain.      Review of Systems  Constitutional: Positive for fever. Negative for activity change, appetite change, chills, diaphoresis, fatigue and unexpected weight change.  HENT: Negative for rhinorrhea, trouble swallowing and voice change.   Eyes: Positive for redness. Negative for photophobia, pain, discharge, itching and visual disturbance.  Respiratory: Negative for cough, shortness of breath, wheezing and stridor.   Cardiovascular: Negative for chest pain, palpitations and leg swelling.  Gastrointestinal: Negative for abdominal distention, abdominal pain, blood in stool, constipation, diarrhea, nausea and vomiting.  Endocrine: Negative for cold intolerance and heat intolerance.  Genitourinary: Positive for dysuria. Negative for decreased urine volume, difficulty urinating, enuresis, flank pain, frequency, genital sores, hematuria, menstrual problem and urgency.  Musculoskeletal: Negative for arthralgias, back pain, gait problem, joint swelling, myalgias, neck pain and neck stiffness.  Skin: Negative for color change, pallor, rash and wound.  Allergic/Immunologic: Positive for food allergies. Negative for environmental allergies.  Neurological: Negative for dizziness, tremors,  seizures, syncope, facial asymmetry, speech difficulty, weakness, light-headedness, numbness and headaches.  Psychiatric/Behavioral: Negative for agitation, confusion and sleep disturbance.       Objective:   Physical Exam  Constitutional: She is oriented to person, place, and time. Vital signs are normal. She appears well-developed and well-nourished. She is active and cooperative.  Non-toxic appearance. She does not have a sickly appearance. She does not appear ill. No distress.  HENT:  Head: Normocephalic and atraumatic.  Right Ear: Hearing and external ear normal.  Left Ear: Hearing and external ear normal.  Nose: Nose normal.  Mouth/Throat: Uvula is midline, oropharynx is clear and moist and mucous membranes are normal. No oropharyngeal exudate, posterior oropharyngeal edema, posterior oropharyngeal erythema or tonsillar abscesses.  Eyes: Conjunctivae, EOM and lids are normal. Pupils are equal, round, and reactive to light. Right eye exhibits no chemosis, no discharge, no exudate and no hordeolum. Left eye exhibits no chemosis, no discharge, no exudate and no hordeolum. Right conjunctiva is not injected. Right conjunctiva has no hemorrhage. Left conjunctiva is not injected. Left conjunctiva has no hemorrhage. No scleral icterus. Right eye exhibits normal extraocular motion and no nystagmus. Left eye exhibits normal extraocular motion and no nystagmus. Right pupil is round and reactive. Left pupil is round and reactive. Pupils are equal.  Neck: Trachea normal, normal range of motion and phonation normal. Neck supple. No tracheal tenderness and no muscular tenderness present. No neck rigidity. No tracheal deviation, no edema, no erythema and normal range of motion present.  Cardiovascular: Normal rate, regular rhythm, normal heart sounds and intact distal pulses.  Pulmonary/Chest: Effort normal and breath sounds normal. No accessory muscle usage or stridor. No respiratory distress. She has no  wheezes. She has no rales. She exhibits no tenderness.  Abdominal:  Soft. Normal appearance. She exhibits no distension, no fluid wave and no ascites. There is no rigidity, no rebound, no guarding and no CVA tenderness.  Musculoskeletal: Normal range of motion. She exhibits no edema, tenderness or deformity.       Right shoulder: Normal.       Left shoulder: Normal.       Right elbow: Normal.      Left elbow: Normal.       Right hip: Normal.       Left hip: Normal.       Right knee: Normal.       Left knee: Normal.       Right hand: Normal.       Left hand: Normal.  Patient uses cane for ambulation; in/out of chair without difficulty; gait sure and steady in hallway  Lymphadenopathy:       Head (right side): No submental, no submandibular, no tonsillar, no preauricular, no posterior auricular and no occipital adenopathy present.       Head (left side): No submental, no submandibular, no tonsillar, no preauricular, no posterior auricular and no occipital adenopathy present.    She has no cervical adenopathy.       Right cervical: No superficial cervical, no deep cervical and no posterior cervical adenopathy present.      Left cervical: No superficial cervical, no deep cervical and no posterior cervical adenopathy present.  Neurological: She is alert and oriented to person, place, and time. She has normal strength. She is not disoriented. She displays no atrophy and no tremor. No cranial nerve deficit or sensory deficit. She exhibits normal muscle tone. She displays no seizure activity. Coordination and gait normal. GCS eye subscore is 4. GCS verbal subscore is 5. GCS motor subscore is 6.  Skin: Skin is warm, dry and intact. No rash noted. She is not diaphoretic. No cyanosis or erythema. No pallor. Nails show no clubbing.  Psychiatric: She has a normal mood and affect. Her speech is normal and behavior is normal. Judgment and thought content normal. Cognition and memory are normal.  Nursing note  and vitals reviewed.         Assessment & Plan:  A-UTI with hematuria, dry eyes  P-stop amoxicillin start bactrim DS po BID x 3 days #40 RF0 dispensed from [facility].  Normal exam but urine strong smell gold slightly cloudy sent for urine culture with [facility] will call patient with results once available.  Keep pushing fluids to keep urine pale clear yellow; do not hold urine void every 4-6 hours. If feeling worse after 48 hours on medication please seek re-evaluation especially if vomiting, urine cola/tea colored, worsening belly pain/back pain, fever or unable to pee every 8 hours.   Recommend repeat ua in 2 weeks to ensure ua returns to baseline.  May drink cranberry juice to help clear bacteria from urinary system.  Exitcare handouts on UTI given to patient.  Patient verbalized understanding information/instructions, agreed with plan of care and had no further questions at this time.  Discussed HVAC has been running more frequently due to cold temps drying air and less humidity outside.  Given refresh drops from clinic stock and electronic Rx  2gtts ou TID prn dry eyes and electronic Rx #1 RF12,  Hydrate.  Look away from monitor hourly.  Follow up if discharge, visual changes, matting am, eye pain.  exitcare handout on dry eyes and drop use.  Patient verbalized understanding information/instructions, agreed with plan of care and  had no further questions at this time.

## 2017-05-07 NOTE — Patient Instructions (Signed)
Dry Eye Dry eye, also called keratoconjunctivitis sicca, is dryness of the membranes surrounding the eye. It happens when there are not enough healthy, natural tears in the eyes. The eyes must remain moist at all times. A small amount of tears is constantly produced by the tear glands (lacrimal glands). These glands are located under the outside part of the upper eyelids. Dryness of the eyes can be a symptom of a variety of conditions, such as rheumatoid arthritis, lupus, or Sjgren syndrome. Dry eye may be mild to severe. What are the causes? This condition may be caused by:  Not making enough tears (aqueous tear-deficient dry eyes).  Tears evaporating from the eye too quickly (evaporative dry eyes). This is when there is an abnormality in the quality of your tears. This abnormality causes your tears to evaporate so quickly that the eye cannot be kept moist.  What increases the risk? This condition is more likely to happen in:  Women, especially those who have gone through menopause.  People in dry climates.  People in dusty or smoky areas.  People who take certain medicines, such as: ? Anti-allergy medicines (antihistamines). ? Blood pressure medicines (antihypertensives). ? Birth control pills (oral contraceptives). ? Laxatives. ? Tranquilizers.  What are the signs or symptoms? Symptoms in the eyes may include:  Irritation.  Itchiness.  Redness.  Burning.  Inflammation of the eyelids.  Feeling as though something is stuck in the eye.  Light sensitivity.  Increased sensitivity and discomfort when wearing contact lenses, if this applies.  Vision that varies throughout the day.  Occasional excessive tearing.  How is this diagnosed? This condition is diagnosed based on your symptoms, your medical history, and an eye exam. Your health care provider may look at your eye using a microscope and may put dyes in your eye to check the health of the surface of your eye. You  may have a tests, such as a test to evaluate your tear production (Schirmer test). During this test, a small strip of special paper is gently pressed into the inner corner of your eye. Your tear production is measured by how much of the paper is moistened by your tears during a set amount of time. You may be referred to a health care provider who specializes in eyes and eyesight (ophthalmologist). How is this treated? This condition is often treated at home. Your health care provider may recommend eye drops, which are also called artificial tears. If your condition is severe, treatment may include:  Prescription eye drops.  Over-the-counter or prescription ointments to moisten your eyes.  Minor surgery to block tears from going into your nose.  Medicines to reduce inflammation of the eyelids.  Follow these instructions at home:  Take, use, or apply over-the-counter and prescription medicines only as told by your health care provider. This includes eye drops.  If directed, apply a warm compress to your eyes to help reduce inflammation. Place a towel over your eyes and gently press the warm compress over your eyes for about 5 minutes, or as long as told by your health care provider.  If possible, avoid dry, drafty environments.  Use a humidifier at home to increase moisture in the air.  If you wear contact lenses, remove them regularly to give your eyes a break. Always remove contacts before sleeping.  Keep all follow-up visits as told by your health care provider. This is important. This includes yearly eye exams and vision tests. Contact a health care provider if:    You have eye pain.  You have pus-like fluid coming from your eye.  Your symptoms get worse or do not improve with treatment. Get help right away if:  Your vision suddenly changes. This information is not intended to replace advice given to you by your health care provider. Make sure you discuss any questions you have  with your health care provider. Document Released: 02/23/2004 Document Revised: 09/13/2015 Document Reviewed: 01/31/2015 Elsevier Interactive Patient Education  2018 [employer]. How to Use Eye Drops and Eye Ointments How to apply eye drops Follow these steps when applying eye drops: 1. Wash your hands. 2. Tilt your head back. 3. Put a finger under your eye and use it to gently pull your lower lid downward. Keep that finger in place. 4. Using your other hand, hold the dropper between your thumb and index finger. 5. Position the dropper just over the edge of the lower lid. Hold it as close to your eye as you can without touching the dropper to your eye. 6. Steady your hand. One way to do this is to lean your index finger against your brow. 7. Look up. 8. Slowly and gently squeeze one drop of medicine into your eye. 9. Close your eye. 10. Place a finger between your lower eyelid and your nose. Press gently for 2 minutes. This increases the amount of time that the medicine is exposed to the eye. It also reduces side effects that can develop if the drop gets into the bloodstream through the nose.  How to apply eye ointments Follow these steps when applying eye ointments: 1. Wash your hands. 2. Put a finger under your eye and use it to gently pull your lower lid downward. Keep that finger in place. 3. Using your other hand, place the tip of the tube between your thumb and index finger with the remaining fingers braced against your cheek or nose. 4. Hold the tube just over the edge of your lower lid without touching the tube to your lid or eyeball. 5. Look up. 6. Line the inner part of your lower lid with ointment. 7. Gently pull up on your upper lid and look down. This will force the ointment to spread over the surface of the eye. 8. Release the upper lid. 9. If you can, close your eyes for 1-2 minutes.  Do not rub your eyes. If you applied the ointment correctly, your vision will be blurry  for a few minutes. This is normal. Additional information  Make sure to use the eye drops or ointment as told by your health care provider.  If you have been told to use both eye drops and an eye ointment, apply the eye drops first, then wait 3-4 minutes before you apply the ointment.  Try not to touch the tip of the dropper or tube to your eye. A dropper or tube that has touched the eye can become contaminated. This information is not intended to replace advice given to you by your health care provider. Make sure you discuss any questions you have with your health care provider. Document Released: 07/14/2000 Document Revised: 09/06/2015 Document Reviewed: 04/03/2014 Elsevier Interactive Patient Education  2018 [employer]. Urinary Tract Infection, Adult A urinary tract infection (UTI) is an infection of any part of the urinary tract, which includes the kidneys, ureters, bladder, and urethra. These organs make, store, and get rid of urine in the body. UTI can be a bladder infection (cystitis) or kidney infection (pyelonephritis). What are the causes?  This infection may be caused by fungi, viruses, or bacteria. Bacteria are the most common cause of UTIs. This condition can also be caused by repeated incomplete emptying of the bladder during urination. What increases the risk? This condition is more likely to develop if:  You ignore your need to urinate or hold urine for long periods of time.  You do not empty your bladder completely during urination.  You wipe back to front after urinating or having a bowel movement, if you are female.  You are uncircumcised, if you are female.  You are constipated.  You have a urinary catheter that stays in place (indwelling).  You have a weak defense (immune) system.  You have a medical condition that affects your bowels, kidneys, or bladder.  You have diabetes.  You take antibiotic medicines frequently or for long periods of time, and the  antibiotics no longer work well against certain types of infections (antibiotic resistance).  You take medicines that irritate your urinary tract.  You are exposed to chemicals that irritate your urinary tract.  You are female.  What are the signs or symptoms? Symptoms of this condition include:  Fever.  Frequent urination or passing small amounts of urine frequently.  Needing to urinate urgently.  Pain or burning with urination.  Urine that smells bad or unusual.  Cloudy urine.  Pain in the lower abdomen or back.  Trouble urinating.  Blood in the urine.  Vomiting or being less hungry than normal.  Diarrhea or abdominal pain.  Vaginal discharge, if you are female.  How is this diagnosed? This condition is diagnosed with a medical history and physical exam. You will also need to provide a urine sample to test your urine. Other tests may be done, including:  Blood tests.  Sexually transmitted disease (STD) testing.  If you have had more than one UTI, a cystoscopy or imaging studies may be done to determine the cause of the infections. How is this treated? Treatment for this condition often includes a combination of two or more of the following:  Antibiotic medicine.  Other medicines to treat less common causes of UTI.  Over-the-counter medicines to treat pain.  Drinking enough water to stay hydrated.  Follow these instructions at home:  Take over-the-counter and prescription medicines only as told by your health care provider.  If you were prescribed an antibiotic, take it as told by your health care provider. Do not stop taking the antibiotic even if you start to feel better.  Avoid alcohol, caffeine, tea, and carbonated beverages. They can irritate your bladder.  Drink enough fluid to keep your urine clear or pale yellow.  Keep all follow-up visits as told by your health care provider. This is important.  Make sure to: ? Empty your bladder often and  completely. Do not hold urine for long periods of time. ? Empty your bladder before and after sex. ? Wipe from front to back after a bowel movement if you are female. Use each tissue one time when you wipe. Contact a health care provider if:  You have back pain.  You have a fever.  You feel nauseous or vomit.  Your symptoms do not get better after 3 days.  Your symptoms go away and then return. Get help right away if:  You have severe back pain or lower abdominal pain.  You are vomiting and cannot keep down any medicines or water. This information is not intended to replace advice given to you by your  health care provider. Make sure you discuss any questions you have with your health care provider. Document Released: 01/15/2005 Document Revised: 09/19/2015 Document Reviewed: 02/26/2015 Elsevier Interactive Patient Education  [employer].

## 2017-05-09 LAB — URINE CULTURE

## 2017-05-12 NOTE — Progress Notes (Signed)
Spoke with pt via phone at home. She reports small twinges or shots of pain that quickly resolve either with urination or sometimes just while sitting. Other sx resolved. 3 days course of Bactrim completed. E coli susceptible to bactrim. Verbal order received and translated to pt from Destiny Kluver, NP to have pt complete another 4 days of abx. Will plan to recheck sx on Friday or Monday when pt returns to work. Pt verbalizes understanding and agreement.

## 2017-05-18 ENCOUNTER — Ambulatory Visit: Payer: Self-pay | Admitting: *Deleted

## 2017-05-18 DIAGNOSIS — R300 Dysuria: Secondary | ICD-10-CM

## 2017-05-18 DIAGNOSIS — N3001 Acute cystitis with hematuria: Secondary | ICD-10-CM

## 2017-05-18 LAB — POCT URINALYSIS DIPSTICK
Bilirubin, UA: NEGATIVE
Glucose, UA: NEGATIVE
Ketones, UA: NEGATIVE
Nitrite, UA: NEGATIVE
Protein, UA: NEGATIVE
Specific Gravity, UA: 1.025 (ref 1.005–1.030)
Urobilinogen, UA: 0.2 E.U./dL
pH, UA: 6 (ref 5.0–8.0)

## 2017-05-18 NOTE — Progress Notes (Signed)
Pt in for repeat UA dip. Pt reports sx have greatly improved but still has mild intermittent low abd/suprapubic burning sensation that lasts only a few seconds, usually occurring with urination, but has also occurred while just sitting and is then associated with urinary urgency.  UA dip performed. Per previous dicusson with NP, new urine culture sent out to lab.  Pt also reports feeling palpitations this AM and checking pulse with pulse oximeter and it was reading pulse of ~120. Hx of Afib and pt believes she was having paroxsymal episode. In clinic, rate has reduced to ~90, regular. BP elevated which she contributes to walk down to clinic. Suggested pt speak with her cardiologist regarding these paroxsymal episodes as she was taken off anticoags late last year at 1 yr post PE.

## 2017-05-20 ENCOUNTER — Ambulatory Visit (INDEPENDENT_AMBULATORY_CARE_PROVIDER_SITE_OTHER): Payer: No Typology Code available for payment source | Admitting: Orthopaedic Surgery

## 2017-05-20 ENCOUNTER — Encounter (INDEPENDENT_AMBULATORY_CARE_PROVIDER_SITE_OTHER): Payer: Self-pay | Admitting: Orthopaedic Surgery

## 2017-05-20 ENCOUNTER — Ambulatory Visit (INDEPENDENT_AMBULATORY_CARE_PROVIDER_SITE_OTHER): Payer: No Typology Code available for payment source

## 2017-05-20 DIAGNOSIS — M25562 Pain in left knee: Secondary | ICD-10-CM | POA: Diagnosis not present

## 2017-05-20 DIAGNOSIS — M1712 Unilateral primary osteoarthritis, left knee: Secondary | ICD-10-CM | POA: Diagnosis not present

## 2017-05-20 DIAGNOSIS — G8929 Other chronic pain: Secondary | ICD-10-CM | POA: Diagnosis not present

## 2017-05-20 MED ORDER — METHYLPREDNISOLONE ACETATE 40 MG/ML IJ SUSP
40.0000 mg | INTRAMUSCULAR | Status: AC | PRN
  Administered 2017-05-20: 40 mg via INTRA_ARTICULAR

## 2017-05-20 MED ORDER — LIDOCAINE HCL 1 % IJ SOLN
3.0000 mL | INTRAMUSCULAR | Status: AC | PRN
  Administered 2017-05-20: 3 mL

## 2017-05-20 NOTE — Progress Notes (Signed)
Office Visit Note   Patient: [name]           Date of Birth: 09/15/1950           MRN: [id number] Visit Date: 05/20/2017              Requested by: [name], NP [address] [place], [place] [address] PCP: [name], NP   Assessment & Plan: Visit Diagnoses:  1. Chronic pain of left knee   2. Unilateral primary osteoarthritis, left knee     Plan: In spite of her obesity she has had a successful right total knee arthroplasty years ago that is still held up well.  She is not a diabetic.  Offered a steroid injection in her knee today and she agreed to this and she tolerated it well.  She would like to be set up for a knee replacement surgery sometime I believe in the spring.  She is someone that would be very cautious with blood thinning medications after surgery in terms of putting her on the most that we can along with compressive garments and getting her knee moving to hopefully decrease her chance of a blood clot and she understands this as well.  I have given her our surgery schedulers card and will give our surgery scheduler a notice about scheduling the surgery sometime I believe in late March or April according the patient.  Follow-Up Instructions: Return for 2 weeks post-op.   Orders:  Orders Placed This Encounter  Procedures  . Large Joint Inj  . XR KNEE 3 VIEW LEFT   No orders of the defined types were placed in this encounter.     Procedures: Large Joint Inj: L knee on 05/20/2017 7:02 PM Indications: diagnostic evaluation and pain Details: 22 G 1.5 in needle, superolateral approach  Arthrogram: No  Medications: 3 mL lidocaine 1 %; 40 mg methylPREDNISolone acetate 40 MG/ML Outcome: tolerated well, no immediate complications Procedure, treatment alternatives, risks and benefits explained, specific risks discussed. Consent was given by the patient. Immediately prior to procedure a time out was called to verify the correct patient,  procedure, equipment, support staff and site/side marked as required. Patient was prepped and draped in the usual sterile fashion.       Clinical Data: No additional findings.   Subjective: Chief Complaint  Patient presents with  . Left Knee - Pain  Patient is well-known to [place].  She is a 67 year old female that is very pleasant and significantly obese who has severe arthritis of her left knee that is been worsening in terms of pain.  We actually performed a right total knee arthroplasty on her many years ago and she said that right knee is always done well for her.  She said now most the pain is on the medial aspect of her knee.  It radiates up to her hip and down her ankle.  She is a cane to ambulate and she is work on activity modification and weight loss.  She takes ibuprofen as anti-inflammatory.  She would like to have a steroid injection today and at this point she like to discuss knee replacement surgery.  She is not a diabetic.  She does have a history though of a DVT after spinal surgery and this did lead to a pulmonary embolus.  She was on Eliquis for a long period time but is now on no blood thinners other than aspirin.  HPI  Review of Systems She currently  denies any chest pain, shortness of breath, fever, chills, nausea, vomiting, headache  Objective: Vital Signs: There were no vitals taken for this visit.  Physical Exam She is alert and oriented x3 and in no acute distress Ortho Exam Examination of her left knee shows mild varus malalignment.  She has significant joint line tenderness and some lateral tenderness.  There is patellofemoral crepitation as well.  The knee flexes and extends well.  She is significantly obese. Specialty Comments:  No specialty comments available.  Imaging: Xr Knee 3 View Left  Result Date: 05/20/2017 2 views of the left knee show severe end-stage arthritis.  There is complete loss of medial joint space with varus malalignment.  There is  periarticular osteophytes in all 3 compartments and significant patellofemoral disease.    PMFS History: Patient Active Problem List   Diagnosis Date Noted  . Chronic pain of left knee 05/20/2017  . Unilateral primary osteoarthritis, left knee 05/20/2017  . Morbid obesity (HCC) 04/30/2017  . Adnexal mass 02/17/2017  . Family history of ovarian cancer 02/17/2017  . IBS (irritable bowel syndrome) 01/21/2017  . Dyspnea 10/08/2016  . Paroxysmal atrial fibrillation (HCC) 06/18/2016  . Type 2 diabetes mellitus (HCC) 01/21/2016  . H/O Spinal surgery 12/21/2015  . Barrett's esophagus 12/21/2015  . Hx of adenomatous polyp of colon 03/01/2002   Past Medical History:  Diagnosis Date  . Abnormal thyroid function test   . Allergy   . Anemia    she attributes previously to fibroids, she declines  . Barrett's esophagus   . Blood transfusion without reported diagnosis   . DDD (degenerative disc disease), cervical   . DJD (degenerative joint disease)   . GERD (gastroesophageal reflux disease)   . Heart murmur   . Hx of adenomatous polyp of colon 03/01/2002  . OA (osteoarthritis)   . Obese   . Paroxysmal atrial fibrillation (HCC)   . PE (pulmonary embolism) 12/21/2015  . Plantar fasciitis   . Type 2 diabetes mellitus (HCC)    diet controlled    Family History  Problem Relation Age of Onset  . Stroke Maternal Grandmother   . Bladder Cancer Maternal Grandmother 50  . Esophageal cancer Maternal Grandmother 50  . Hyperthyroidism Mother        Radioactive Iodine Treatment  . Hypothyroidism Mother   . Cervical cancer Maternal Aunt 50  . Ovarian cancer Daughter 78  . Colon cancer Neg Hx   . Pancreatic cancer Neg Hx   . Rectal cancer Neg Hx   . Stomach cancer Neg Hx     Past Surgical History:  Procedure Laterality Date  . ABDOMINAL HYSTERECTOMY    . Arthroscopic surgery left knee    . BACK SURGERY    . CESAREAN SECTION    . CHOLECYSTECTOMY    . COLONOSCOPY  2003, 2011   3 mm  adenoma 2011  . ESOPHAGOGASTRODUODENOSCOPY  multiple since 2003  . IR GENERIC HISTORICAL  12/19/2015   IR ANGIOGRAM SELECTIVE EACH ADDITIONAL VESSEL 12/19/2015 [name], MD [facility]  . IR GENERIC HISTORICAL  12/19/2015   IR ANGIOGRAM PULMONARY BILATERAL SELECTIVE 12/19/2015 [name], MD [facility]  . IR GENERIC HISTORICAL  12/19/2015   IR INFUSION THROMBOL ARTERIAL INITIAL (MS) 12/19/2015 [name], MD [facility]  . IR GENERIC HISTORICAL  12/19/2015   IR US GUIDE VASC ACCESS RIGHT 12/19/2015 [name], MD [facility]  . IR GENERIC HISTORICAL  12/19/2015   IR ANGIOGRAM SELECTIVE EACH ADDITIONAL VESSEL 12/19/2015 Casimiro Needle  Miles Costain, MD [facility]  . IR GENERIC HISTORICAL  12/20/2015   IR THROMB F/U EVAL ART/VEN FINAL DAY (MS) 12/20/2015 Gilmer Mor, DO [facility]  . IR GENERIC HISTORICAL  12/19/2015   IR INFUSION THROMBOL ARTERIAL INITIAL (MS) 12/19/2015 [name], MD [facility]  . Right knee replacement    . SPINAL FUSION  10/31/2015   revision at [facility]   . TONSILLECTOMY    . VENOUS ABLATION     x 2   Social History   Occupational History    Comment: Telephone sales  Tobacco Use  . Smoking status: Never Smoker  . Smokeless tobacco: Never Used  Substance and Sexual Activity  . Alcohol use: No  . Drug use: No  . Sexual activity: No    Partners: Male    Birth control/protection: Surgical    Comment: Hysterectomy

## 2017-05-22 ENCOUNTER — Telehealth: Payer: Self-pay | Admitting: Registered Nurse

## 2017-05-22 ENCOUNTER — Encounter: Payer: Self-pay | Admitting: Registered Nurse

## 2017-05-22 LAB — URINE CULTURE

## 2017-05-22 MED ORDER — NITROFURANTOIN MONOHYD MACRO 100 MG PO CAPS: 100.0000 mg | ORAL_CAPSULE | Freq: Two times a day (BID) | ORAL | 0 refills | Status: AC

## 2017-05-22 NOTE — Progress Notes (Signed)
Pharmacy confirmed with pt via phone. [facility]. Pt reports receiving results via MyChart and does not need hard copy. [facility] aware [facility]. Instructed to pick up and start this evening. Appt made for follow up in clinic next week for urine recheck. Pt agreeable to plan.

## 2017-05-22 NOTE — Telephone Encounter (Signed)
Patient notified via mychart of urine culture results staphylococcus epidermidis resistant to bactrim and penicillins and patient allergic ciprofloxacin and doxycycline and she contacted RN Hildred Alamin to verify pharmacy preference electronic Rx sent to [facility] per patient request for macrodantin 100mg  po BID x 5 days #10 RF0.  Patient will follow up in 1 week for repeat urinalysis with RN sooner if new or worsening symptoms.

## 2017-06-12 ENCOUNTER — Ambulatory Visit: Payer: No Typology Code available for payment source | Admitting: Family Medicine

## 2017-06-12 ENCOUNTER — Other Ambulatory Visit: Payer: Self-pay

## 2017-06-12 ENCOUNTER — Encounter: Payer: Self-pay | Admitting: Family Medicine

## 2017-06-12 VITALS — BP 118/80 | HR 73 | Temp 97.9°F | Ht 62.0 in | Wt 258.8 lb

## 2017-06-12 DIAGNOSIS — R509 Fever, unspecified: Secondary | ICD-10-CM

## 2017-06-12 DIAGNOSIS — J069 Acute upper respiratory infection, unspecified: Secondary | ICD-10-CM

## 2017-06-12 DIAGNOSIS — B9789 Other viral agents as the cause of diseases classified elsewhere: Secondary | ICD-10-CM

## 2017-06-12 LAB — POC INFLUENZA A&B (BINAX/QUICKVUE)
Influenza A, POC: NEGATIVE
Influenza B, POC: NEGATIVE

## 2017-06-12 NOTE — Patient Instructions (Signed)
Flu was negative. Likely viral infection.. Expect 7-10 days of illness.  Rest, fluids.  Cough suppressant as needed.  Call if new fever late in illness or shortness of breath, if severe shortness of breath got to ER.

## 2017-06-12 NOTE — Progress Notes (Signed)
Subjective:    Patient ID: [name], female    DOB: November 13, 1950, 67 y.o.   MRN: [id number]  Cough  This is a new problem. The current episode started in the past 7 days. The cough is productive of sputum. Associated symptoms include ear pain, a fever, headaches, myalgias, nasal congestion, a sore throat, shortness of breath and wheezing. Pertinent negatives include no ear congestion. Associated symptoms comments: Explosive diarrhea 4 days ago  hoarse voice, chest congestion  ear hurt, headache  bilateral ear pain. The symptoms are aggravated by lying down. Risk factors: nonsmoker. She has tried OTC cough suppressant ( advil, sudafed) for the symptoms. The treatment provided mild relief. There is no history of asthma or COPD.  Headache   Associated symptoms include coughing, ear pain, a fever and a sore throat.  Fever   Associated symptoms include coughing, ear pain, headaches, a sore throat and wheezing.    sinus infection 2 weeks ago.Marland Kitchen Resolved with antibiotics.   fever 101F   hx of PE, a fib,  DM  Blood pressure 118/80, pulse 73, temperature 97.9 F (36.6 C), temperature source Oral, height 5\' 2"  (1.575 m), weight 258 lb 12 oz (117.4 kg), SpO2 98 %.   Review of Systems  Constitutional: Positive for fever.  HENT: Positive for ear pain and sore throat.   Respiratory: Positive for cough, shortness of breath and wheezing.   Musculoskeletal: Positive for myalgias.  Neurological: Positive for headaches.       Objective:   Physical Exam  Constitutional: Vital signs are normal. She appears well-developed and well-nourished. She is cooperative.  Non-toxic appearance. She appears ill. No distress.  HENT:  Head: Normocephalic.  Right Ear: Hearing, tympanic membrane, external ear and ear canal normal. Tympanic membrane is not erythematous, not retracted and not bulging.  Left Ear: Hearing, tympanic membrane, external ear and ear canal normal. Tympanic membrane is not  erythematous, not retracted and not bulging.  Nose: Mucosal edema and rhinorrhea present. Right sinus exhibits no maxillary sinus tenderness and no frontal sinus tenderness. Left sinus exhibits no maxillary sinus tenderness and no frontal sinus tenderness.  Mouth/Throat: Uvula is midline, oropharynx is clear and moist and mucous membranes are normal.  Eyes: Conjunctivae, EOM and lids are normal. Pupils are equal, round, and reactive to light. Lids are everted and swept, no foreign bodies found.  Neck: Trachea normal and normal range of motion. Neck supple. Carotid bruit is not present. No thyroid mass and no thyromegaly present.  Cardiovascular: Normal rate, regular rhythm, S1 normal, S2 normal, normal heart sounds, intact distal pulses and normal pulses. Exam reveals no gallop and no friction rub.  No murmur heard. Pulmonary/Chest: Effort normal and breath sounds normal. No tachypnea. No respiratory distress. She has no decreased breath sounds. She has no wheezes. She has no rhonchi. She has no rales.  Neurological: She is alert.  Skin: Skin is warm, dry and intact. No rash noted.  Psychiatric: Her speech is normal and behavior is normal. Judgment normal. Her mood appears not anxious. Cognition and memory are normal. She does not exhibit a depressed mood.          Assessment & Plan:

## 2017-06-12 NOTE — Assessment & Plan Note (Signed)
Rest, fluid and symptomatic care.  neg flu test.

## 2017-06-30 ENCOUNTER — Telehealth: Payer: Self-pay | Admitting: Primary Care

## 2017-06-30 NOTE — Telephone Encounter (Signed)
Pt dropped off fmla paperwork In dr Diona Browner in box

## 2017-07-03 DIAGNOSIS — Z0279 Encounter for issue of other medical certificate: Secondary | ICD-10-CM

## 2017-07-06 NOTE — Telephone Encounter (Signed)
Paperwork faxed Left message letting pt know paperwork had been faxed Copy for pt Copy for scan Copy for billing

## 2017-07-13 ENCOUNTER — Telehealth: Payer: Self-pay | Admitting: Primary Care

## 2017-07-13 NOTE — Telephone Encounter (Signed)
Copied from [place] [id number]. Topic: Quick Communication - See Telephone Encounter >> Jul 13, 2017  2:38 PM Lolita Rieger, RMA wrote: CRM for notification. See Telephone encounter for: 07/13/17.Ann from [place] (paperwork scanned into chart if further info needed)called and wanted to know if Diona Browner would ok the pt being out until the 07/15/17 and also if pt has been seen any other time than the 06/12/17 appt Please call Ann@ [phone]

## 2017-07-14 NOTE — Telephone Encounter (Signed)
If not improving.. Can remain out until 3/27.. For longer would need to be re-evaluated. She has not seen any other MDs for this as far as I know.

## 2017-07-15 NOTE — Telephone Encounter (Signed)
[name], Is this something you need to do on her [place] paperwork?

## 2017-07-15 NOTE — Telephone Encounter (Signed)
Spoke to Lula @ [facility].  I read her dr Diona Browner statement about pt staying out till 3/27. She noted this and said if they have any further questions they would contact office back

## 2017-08-13 ENCOUNTER — Encounter: Payer: Self-pay | Admitting: Family Medicine

## 2017-08-13 ENCOUNTER — Ambulatory Visit (INDEPENDENT_AMBULATORY_CARE_PROVIDER_SITE_OTHER): Payer: No Typology Code available for payment source

## 2017-08-13 ENCOUNTER — Ambulatory Visit: Payer: No Typology Code available for payment source | Admitting: Family Medicine

## 2017-08-13 VITALS — BP 122/74 | HR 83 | Temp 97.8°F | Ht 62.0 in | Wt 257.2 lb

## 2017-08-13 DIAGNOSIS — J22 Unspecified acute lower respiratory infection: Secondary | ICD-10-CM | POA: Diagnosis not present

## 2017-08-13 DIAGNOSIS — I480 Paroxysmal atrial fibrillation: Secondary | ICD-10-CM | POA: Diagnosis not present

## 2017-08-13 DIAGNOSIS — E1169 Type 2 diabetes mellitus with other specified complication: Secondary | ICD-10-CM | POA: Diagnosis not present

## 2017-08-13 LAB — POC INFLUENZA A&B (BINAX/QUICKVUE)
Influenza A, POC: NEGATIVE
Influenza B, POC: NEGATIVE

## 2017-08-13 MED ORDER — BENZONATATE 100 MG PO CAPS: 100.0000 mg | ORAL_CAPSULE | Freq: Three times a day (TID) | ORAL | 0 refills | Status: DC | PRN

## 2017-08-13 MED ORDER — AZITHROMYCIN 250 MG PO TABS: ORAL_TABLET | ORAL | 0 refills | Status: DC

## 2017-08-13 MED ORDER — CEFTRIAXONE SODIUM 1 G IJ SOLR
1.0000 g | Freq: Once | INTRAMUSCULAR | Status: AC
Start: 2017-08-13 — End: 2017-08-13
  Administered 2017-08-13: 1 g via INTRAMUSCULAR

## 2017-08-13 NOTE — Assessment & Plan Note (Signed)
Ill appearing, but vitals stable.  Flu swab negative. Anticipate acute bronchitis, r/o CAP. Will send to [facility] for xray as ours are down today.  Treat with rocephin 1gm IM, zpack, tessalon perls to cover CAP.  Red flags to return for further evaluation reviewed.  Anaphylaxis to cipro, doxy.

## 2017-08-13 NOTE — Assessment & Plan Note (Signed)
Sounds regular today. Off eliquis, only on aspirin 81mg  daily per cards.

## 2017-08-13 NOTE — Patient Instructions (Addendum)
I think you have bad bronchitis. Treat with shot of rocephin then take zpack antibiotic sent to your pharmacy. Push fluids and rest. Take tessalon perls to help suppress cough - swallow, don't chew.  I do want you to go to [facility] for xray today.  Address is: [address], [place], [place] [address] [phone] Return if persistent fever >101, worsening productive cough or not improving with treatment.  Schedule follow up with Anda Kraft for physical as you're due. I recommend you also schedule mammogram.

## 2017-08-13 NOTE — Progress Notes (Signed)
BP 122/74 (BP Location: Left Arm, Patient Position: Sitting, Cuff Size: Large)   Pulse 83   Temp 97.8 F (36.6 C) (Oral)   Ht 5\' 2"  (1.575 m)   Wt 257 lb 4 oz (116.7 kg)   SpO2 97%   BMI 47.05 kg/m    CC: cough Subjective:    Patient ID: [name], female    DOB: 23-Dec-1950, 67 y.o.   MRN: [id number]  HPI: [name] is a 67 y.o. female presenting on 08/13/2017 for Cough (Productive cough with yellow- greenish mucous. Started 08/08/17. Thinks it is moving down to her chest. Has burning sensation with cough.  States her lungs sound noisy when lying down. ); Nasal Congestion (Has some yellow- greenish nasal discharge.); Dizziness (Has felt lightheaded sometimes); and Mass (Noticed lump in throat about 2 wks ago. Area is painful. )   6d h/o deep productive cough of yellow green mucous with coughing fits and streaks of blood, hoarse voice, chest > head congestion. Feverish, chills. Tmax 101, latest this morning. Symptoms started with severe sore throat. R earache, diarrhea. Some jaw pain. States at home pulse ox can drop to 90%. + PNDrainage. Orthopnea. Some dizziness. Staying wheezy and dyspneic.   No tooth pain.   No sick contacts at home. + sick contacts at work.  No smokers at home. No h/o asthma.   Noticed lump R lower neck 4d ago - some trouble swallowing. No recent mammogram.   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  H/o afib, PE (11/2015), DM.   H/o anaphylaxis to cipro/doxy.  Relevant past medical, surgical, family and social history reviewed and updated as indicated. Interim medical history since our last visit reviewed. Allergies and medications reviewed and updated. Outpatient Medications Prior to Visit  Medication Sig Dispense Refill  . aspirin EC 81 MG tablet Take 81 mg by mouth daily.    . Cholecalciferol (VITAMIN D-3) 1000 units CAPS Take 1,000 Units by mouth every evening.     . [facility] LUBRICANT DROPS 1 %  GEL APPLY 1 DROP TO EYE 3 (THREE) TIMES DAILY FOR 7 DAYS.  12  . pantoprazole (PROTONIX) 40 MG tablet Take 1 tablet (40 mg total) by mouth daily before breakfast. 90 tablet 3   No facility-administered medications prior to visit.      Per HPI unless specifically indicated in ROS section below Review of Systems     Objective:    BP 122/74 (BP Location: Left Arm, Patient Position: Sitting, Cuff Size: Large)   Pulse 83   Temp 97.8 F (36.6 C) (Oral)   Ht 5\' 2"  (1.575 m)   Wt 257 lb 4 oz (116.7 kg)   SpO2 97%   BMI 47.05 kg/m   Wt Readings from Last 3 Encounters:  08/13/17 257 lb 4 oz (116.7 kg)  06/12/17 258 lb 12 oz (117.4 kg)  04/03/17 254 lb (115.2 kg)    Physical Exam  Constitutional: She appears well-developed and well-nourished. No distress.  HENT:  Head: Normocephalic and atraumatic.  Right Ear: Hearing, tympanic membrane, external ear and ear canal normal.  Left Ear: Hearing, tympanic membrane, external ear and ear canal normal.  Nose: Mucosal edema and rhinorrhea present. Right sinus exhibits no maxillary sinus tenderness and no frontal sinus tenderness. Left sinus exhibits no maxillary sinus tenderness and no frontal sinus tenderness.  Mouth/Throat: Uvula is midline, oropharynx is clear and moist and mucous membranes are normal.  No oropharyngeal exudate, posterior oropharyngeal edema, posterior oropharyngeal erythema or tonsillar abscesses.  Evidently congestion  Eyes: Pupils are equal, round, and reactive to light. Conjunctivae and EOM are normal. No scleral icterus.  Neck: Normal range of motion. Neck supple.  No appreciable supraclavicular lymphadenopathy  Cardiovascular: Normal rate, regular rhythm, normal heart sounds and intact distal pulses.  No murmur heard. Distant breath sounds  Pulmonary/Chest: Effort normal. No respiratory distress. She has decreased breath sounds. She has no wheezes. She has rhonchi (coarse LLL) in the left lower field. She has rales  (coarse LLL) in the left lower field.  Lymphadenopathy:    She has no cervical adenopathy.  Skin: Skin is warm and dry. No rash noted.  Nursing note and vitals reviewed.  Results for orders placed or performed in visit on 08/13/17  POC Influenza A&B(BINAX/QUICKVUE)  Result Value Ref Range   Influenza A, POC Negative Negative   Influenza B, POC Negative Negative      Assessment & Plan:  Advised she is overdue for mammogram - encouraged she schedule this.  Problem List Items Addressed This Visit    Acute respiratory infection - Primary    Ill appearing, but vitals stable.  Flu swab negative. Anticipate acute bronchitis, r/o CAP. Will send to [facility] for xray as ours are down today.  Treat with rocephin 1gm IM, zpack, tessalon perls to cover CAP.  Red flags to return for further evaluation reviewed.  Anaphylaxis to cipro, doxy.       Relevant Medications   azithromycin (ZITHROMAX) 250 MG tablet   cefTRIAXone (ROCEPHIN) injection 1 g (Completed) (Start on 08/13/2017 12:30 PM)   Other Relevant Orders   DG Chest 2 View   POC Influenza A&B(BINAX/QUICKVUE) (Completed)   Morbid obesity (HCC)   Paroxysmal atrial fibrillation (HCC)    Sounds regular today. Off eliquis, only on aspirin 81mg  daily per cards.       Type 2 diabetes mellitus with other specified complication (HCC)       Meds ordered this encounter  Medications  . azithromycin (ZITHROMAX) 250 MG tablet    Sig: Take two tablets on day one followed by one tablet on days 2-5    Dispense:  6 each    Refill:  0  . benzonatate (TESSALON) 100 MG capsule    Sig: Take 1 capsule (100 mg total) by mouth 3 (three) times daily as needed for cough.    Dispense:  30 capsule    Refill:  0  . cefTRIAXone (ROCEPHIN) injection 1 g   Orders Placed This Encounter  Procedures  . DG Chest 2 View    Standing Status:   Future    Standing Expiration Date:   10/14/2018    Order Specific Question:   Reason for Exam (SYMPTOM  OR  DIAGNOSIS REQUIRED)    Answer:   LLL rales, rhonchi, cough with fever    Order Specific Question:   Preferred imaging location?    Answer:   [employer] Specific Question:   Radiology Contrast Protocol - do NOT remove file path    Answer:   [email or web address]  . POC Influenza A&B(BINAX/QUICKVUE)    Follow up plan: Return if symptoms worsen or fail to improve.  Eustaquio Boyden, MD

## 2017-08-14 ENCOUNTER — Telehealth: Payer: Self-pay | Admitting: Primary Care

## 2017-08-14 NOTE — Telephone Encounter (Signed)
[facility] faxed certification of health care provider form To be filled out In dr g in box

## 2017-08-14 NOTE — Telephone Encounter (Signed)
Filled and in my outbox

## 2017-08-18 NOTE — Telephone Encounter (Signed)
Paperwork faxed ° °Pt aware °Copy for pt °Copy for scan °

## 2017-08-25 ENCOUNTER — Encounter: Payer: Self-pay | Admitting: Internal Medicine

## 2017-08-25 ENCOUNTER — Ambulatory Visit: Payer: No Typology Code available for payment source | Admitting: Internal Medicine

## 2017-08-25 VITALS — BP 128/82 | HR 78 | Ht 62.0 in | Wt 260.2 lb

## 2017-08-25 DIAGNOSIS — Z8349 Family history of other endocrine, nutritional and metabolic diseases: Secondary | ICD-10-CM

## 2017-08-25 DIAGNOSIS — E119 Type 2 diabetes mellitus without complications: Secondary | ICD-10-CM | POA: Diagnosis not present

## 2017-08-25 LAB — POCT GLYCOSYLATED HEMOGLOBIN (HGB A1C): Hemoglobin A1C: 6.1

## 2017-08-25 NOTE — Patient Instructions (Signed)
Please come back for a follow-up appointment in 6 months.   

## 2017-08-25 NOTE — Progress Notes (Signed)
Patient ID: [name], female   DOB: Jan 26, 1951, 67 y.o.   MRN: [id number]    HPI  [name] is a 67 y.o.-year-old female, returning for f/u for DM2, mild, non-insulin-dependent, without complicationas.His husband is also my pt, [name]. Last visit 6 months ago.  She has bronchitis >> 3 courses of ABx  DM2: - Had 2 elevated HbA1c after steroids for back pain >> HbA1c started to decrease.  Last HbA1c: Lab Results  Component Value Date   HGBA1C 6.5 01/07/2017   HGBA1C 6.3 (H) 09/25/2016   HGBA1C 6.4 08/21/2016   HGBA1C 6.6 (H) 02/22/2016   HGBA1C 6.9 (H) 09/24/2015   Prev.: 5.9%  She checked CBG 0-1x a day: - am: 72, 101-110 >> 91, 92 - 2h after b'fast: n/c - lunch: n/c - 2h after lunch: 126 - dinner: n/c - 2h after dinner: n/c (dinner late: gets off work at 7:30) >> 62 - bedtime: n/c  Diet: - Breakfast: Sausage biscuit - Lunch: Meat loaf, fish, or pasta - Dinner: Small - Snacks: 1 a day: String cheese or piece of candy, 100-calorie snack  - no CKD: Lab Results  Component Value Date   BUN 14 01/21/2017   Lab Results  Component Value Date   CREATININE 0.79 01/21/2017  She is not on an ACE inhibitor/ARB  - No HL: Lab Results  Component Value Date   CHOL 159 09/25/2016   HDL 49 09/25/2016   LDLCALC 96 09/25/2016   TRIG 70 09/25/2016   CHOLHDL 3.2 09/25/2016  She is not on a statin.  - last eye exam: 11/2015 >> No DR - no numbness tingling in feet  She is on ASA 81.  She is not on a statinShe has a family history of thyroid disease, but investigation at last visit was negative for hypothyroidism or Hashimoto's thyroiditis: Lab Results  Component Value Date   TSH 2.470 09/25/2016   TSH 1.99 02/22/2016   TSH 3.380 09/24/2015   Lab Results  Component Value Date   FREET4 0.76 02/22/2016   T3FREE 3.5 02/22/2016   Thyroid Ab's normal: Component     Latest Ref Rng & Units 02/22/2016  Thyroglobulin Ab     <2 IU/mL <1   Thyroperoxidase Ab SerPl-aCnc     <9 IU/mL 1   She has a history of Barrett's esophagus. In 12/19/2015 she had a saddle PE. She was on Eliquis >> stopped Fall 2018. She also has a history of back surgery 10/31/2015. Previous back surgery in 2005. She had multiple courses of steroids, not recently.  She will have L TKR on 10/13/2017.   ROS: Constitutional: no weight gain/no weight loss, no fatigue, no subjective hyperthermia, no subjective hypothermia Eyes: no blurry vision, no xerophthalmia ENT: no sore throat, no nodules palpated in throat, no dysphagia, no odynophagia, no hoarseness Cardiovascular: no CP/no SOB/no palpitations/no leg swelling Respiratory: no cough/no SOB/no wheezing Gastrointestinal: no N/no V/no D/no C/no acid reflux Musculoskeletal: no muscle aches/+ joint aches (L knee) - also knee instability Skin: no rashes, no hair loss Neurological: no tremors/no numbness/no tingling/no dizziness  I reviewed pt's medications, allergies, PMH, social hx, family hx, and changes were documented in the history of present illness. Otherwise, unchanged from my initial visit note.  Past Medical History:  Diagnosis Date  . Abnormal thyroid function test   . Allergy   . Anemia    she attributes previously to fibroids, she declines  . Barrett's esophagus   . Blood transfusion without  reported diagnosis   . DDD (degenerative disc disease), cervical   . DJD (degenerative joint disease)   . GERD (gastroesophageal reflux disease)   . Heart murmur   . Hx of adenomatous polyp of colon 03/01/2002  . OA (osteoarthritis)   . Obese   . Paroxysmal atrial fibrillation (HCC)   . PE (pulmonary embolism) 12/21/2015  . Plantar fasciitis   . Type 2 diabetes mellitus (HCC)    diet controlled   Past Surgical History:  Procedure Laterality Date  . ABDOMINAL HYSTERECTOMY    . Arthroscopic surgery left knee    . BACK SURGERY    . CESAREAN SECTION    . CHOLECYSTECTOMY    . COLONOSCOPY   2003, 2011   3 mm adenoma 2011  . ESOPHAGOGASTRODUODENOSCOPY  multiple since 2003  . IR GENERIC HISTORICAL  12/19/2015   IR ANGIOGRAM SELECTIVE EACH ADDITIONAL VESSEL 12/19/2015 [name], MD [facility]  . IR GENERIC HISTORICAL  12/19/2015   IR ANGIOGRAM PULMONARY BILATERAL SELECTIVE 12/19/2015 [name], MD [facility]  . IR GENERIC HISTORICAL  12/19/2015   IR INFUSION THROMBOL ARTERIAL INITIAL (MS) 12/19/2015 [name], MD [facility]  . IR GENERIC HISTORICAL  12/19/2015   IR US GUIDE VASC ACCESS RIGHT 12/19/2015 [name], MD [facility]  . IR GENERIC HISTORICAL  12/19/2015   IR ANGIOGRAM SELECTIVE EACH ADDITIONAL VESSEL 12/19/2015 [name], MD [facility]  . IR GENERIC HISTORICAL  12/20/2015   IR THROMB F/U EVAL ART/VEN FINAL DAY (MS) 12/20/2015 Gilmer Mor, DO [facility]  . IR GENERIC HISTORICAL  12/19/2015   IR INFUSION THROMBOL ARTERIAL INITIAL (MS) 12/19/2015 [name], MD [facility]  . Right knee replacement    . SPINAL FUSION  10/31/2015   revision at [facility]   . TONSILLECTOMY    . VENOUS ABLATION     x 2   Social History   Socioeconomic History  . Marital status: Married    Spouse name: Not on file  . Number of children: 3  . Years of education: Not on file  . Highest education level: Not on file  Occupational History    Comment: Telephone sales  Social Needs  . Financial resource strain: Not on file  . Food insecurity:    Worry: Not on file    Inability: Not on file  . Transportation needs:    Medical: Not on file    Non-medical: Not on file  Tobacco Use  . Smoking status: Never Smoker  . Smokeless tobacco: Never Used  Substance and Sexual Activity  . Alcohol use: No  . Drug use: No  . Sexual activity: Never    Partners: Male    Birth control/protection: Surgical    Comment: Hysterectomy  Lifestyle  . Physical activity:    Days per week: Not on file    Minutes per session: Not on file  . Stress: Not on file   Relationships  . Social connections:    Talks on phone: Not on file    Gets together: Not on file    Attends religious service: Not on file    Active member of club or organization: Not on file    Attends meetings of clubs or organizations: Not on file    Relationship status: Not on file  . Intimate partner violence:    Fear of current or ex partner: Not on file    Emotionally abused: Not on file    Physically abused: Not on file  Forced sexual activity: Not on file  Other Topics Concern  . Not on file  Social History Narrative   Married.   3 children, 2 grandchildren.   Work's in a call center.   Enjoys reading, sewing, spending time with her grandchildren.   Current Outpatient Medications on File Prior to Visit  Medication Sig Dispense Refill  . aspirin EC 81 MG tablet Take 81 mg by mouth daily.    Marland Kitchen azithromycin (ZITHROMAX) 250 MG tablet Take two tablets on day one followed by one tablet on days 2-5 6 each 0  . benzonatate (TESSALON) 100 MG capsule Take 1 capsule (100 mg total) by mouth 3 (three) times daily as needed for cough. 30 capsule 0  . Cholecalciferol (VITAMIN D-3) 1000 units CAPS Take 1,000 Units by mouth every evening.     . [facility] LUBRICANT DROPS 1 % GEL APPLY 1 DROP TO EYE 3 (THREE) TIMES DAILY FOR 7 DAYS.  12  . pantoprazole (PROTONIX) 40 MG tablet Take 1 tablet (40 mg total) by mouth daily before breakfast. 90 tablet 3   No current facility-administered medications on file prior to visit.    Allergies  Allergen Reactions  . Ciprofloxacin Anaphylaxis  . Doxycycline Anaphylaxis  . Strawberry Extract Anaphylaxis  . Tetracyclines & Related Anaphylaxis   Family History  Problem Relation Age of Onset  . Stroke Maternal Grandmother   . Bladder Cancer Maternal Grandmother 50  . Esophageal cancer Maternal Grandmother 50  . Hyperthyroidism Mother        Radioactive Iodine Treatment  . Hypothyroidism Mother   . Cervical cancer Maternal Aunt 50  . Ovarian cancer  Daughter 25  . Colon cancer Neg Hx   . Pancreatic cancer Neg Hx   . Rectal cancer Neg Hx   . Stomach cancer Neg Hx     PE: BP 128/82   Pulse 78   Ht 5\' 2"  (1.575 m)   Wt 260 lb 3.2 oz (118 kg)   SpO2 97%   BMI 47.59 kg/m  Wt Readings from Last 3 Encounters:  08/25/17 260 lb 3.2 oz (118 kg)  08/13/17 257 lb 4 oz (116.7 kg)  06/12/17 258 lb 12 oz (117.4 kg)   Constitutional: overweight, in NAD Eyes: PERRLA, EOMI, no exophthalmos ENT: moist mucous membranes, no thyromegaly, no cervical lymphadenopathy Cardiovascular: RRR, No MRG Respiratory: CTA B Gastrointestinal: abdomen soft, NT, ND, BS+ Musculoskeletal: no deformities, strength intact in all 4 Skin: moist, warm, no rashes Neurological: no tremor with outstretched hands, DTR normal in all 4  ASSESSMENT: 1. Non-insulin diabetes type 2, w/o long term complication  2.  Obesity class III  3. FH of thyroid disease   PLAN:  1. Non-insulin diabetes type 2, without complication  - she has slightly elevated HbA1c, fluctuating around the threshold between prediabetes and diabetes - reviewed latest HbA1c, which was 6.5%  - no formal surgical treatment needed for now - today, HbA1c is 6.1% (better) -she is clear for knee surgery from the diabetes point of view - continue checking sugars at different times of the day - check 1x a day, rotating checks - let me know if they start to increase   - advised for yearly eye exams >> she is UTD - Return to clinic in 6 mo with sugar log   2.  Obesity class III - weight higher at this visit  3. FH of thyroid disease - Reviewed all her thyroid tests from previous visit: TFTs and thyroid antibodies normal  Carlus Pavlov, MD PhD [facility]

## 2017-09-30 ENCOUNTER — Other Ambulatory Visit (INDEPENDENT_AMBULATORY_CARE_PROVIDER_SITE_OTHER): Payer: Self-pay | Admitting: Physician Assistant

## 2017-09-30 ENCOUNTER — Other Ambulatory Visit (INDEPENDENT_AMBULATORY_CARE_PROVIDER_SITE_OTHER): Payer: Self-pay

## 2017-09-30 NOTE — Pre-Procedure Instructions (Signed)
CHELSI WARR  09/30/2017      [id number] [place], [place] - [address] [place] [address] [place] [address] Phone: [phone] Fax: [phone]    Your procedure is scheduled on Tuesday June 25.  Report to [facility] Admitting at 7:45 A.M.  Call this number if you have problems the morning of surgery:  [phone]   Remember:  Do not eat or drink after midnight.     Take these medicines the morning of surgery with A SIP OF WATER: NONE  7 days prior to surgery STOP taking any Aleve, Naproxen, Ibuprofen, Motrin, Advil, Goody's, BC's, all herbal medications, fish oil, and all vitamins  **Follow your surgeon's instructions on stopping Aspirin. If no instructions were given, please call your surgeons office**     How to Manage Your Diabetes Before and After Surgery  Why is it important to control my blood sugar before and after surgery? . Improving blood sugar levels before and after surgery helps healing and can limit problems. . A way of improving blood sugar control is eating a healthy diet by: o  Eating less sugar and carbohydrates o  Increasing activity/exercise o  Talking with your doctor about reaching your blood sugar goals . High blood sugars (greater than 180 mg/dL) can raise your risk of infections and slow your recovery, so you will need to focus on controlling your diabetes during the weeks before surgery. . Make sure that the doctor who takes care of your diabetes knows about your planned surgery including the date and location.  How do I manage my blood sugar before surgery? . Check your blood sugar at least 4 times a day, starting 2 days before surgery, to make sure that the level is not too high or low. o Check your blood sugar the morning of your surgery when you wake up and every 2 hours until you get to the Short Stay unit. . If your blood sugar is less than 70 mg/dL, you will need to  treat for low blood sugar: o Do not take insulin. o Treat a low blood sugar (less than 70 mg/dL) with  cup of clear juice (cranberry or apple), 4 glucose tablets, OR glucose gel. Recheck blood sugar in 15 minutes after treatment (to make sure it is greater than 70 mg/dL). If your blood sugar is not greater than 70 mg/dL on recheck, call [phone] o  for further instructions. . Report your blood sugar to the short stay nurse when you get to Short Stay.  . If you are admitted to the hospital after surgery: o Your blood sugar will be checked by the staff and you will probably be given insulin after surgery (instead of oral diabetes medicines) to make sure you have good blood sugar levels. o The goal for blood sugar control after surgery is 80-180 mg/dL.             Do not wear jewelry, make-up or nail polish.  Do not wear lotions, powders, or perfumes, or deodorant.  Do not shave 48 hours prior to surgery.  Men may shave face and neck.  Do not bring valuables to the hospital.  [facility] is not responsible for any belongings or valuables.  Contacts, dentures or bridgework may not be worn into surgery.  Leave your suitcase in the car.  After surgery it may be brought to your room.  For patients admitted to the hospital,  discharge time will be determined by your treatment team.  Patients discharged the day of surgery will not be allowed to drive home.   Special instructions:    [place]- Preparing For Surgery  Before surgery, you can play an important role. Because skin is not sterile, your skin needs to be as free of germs as possible. You can reduce the number of germs on your skin by washing with CHG (chlorahexidine gluconate) Soap before surgery.  CHG is an antiseptic cleaner which kills germs and bonds with the skin to continue killing germs even after washing.    Oral Hygiene is also important to reduce your risk of infection.  Remember - BRUSH YOUR TEETH THE MORNING OF  SURGERY WITH YOUR REGULAR TOOTHPASTE  Please do not use if you have an allergy to CHG or antibacterial soaps. If your skin becomes reddened/irritated stop using the CHG.  Do not shave (including legs and underarms) for at least 48 hours prior to first CHG shower. It is OK to shave your face.  Please follow these instructions carefully.   1. Shower the NIGHT BEFORE SURGERY and the MORNING OF SURGERY with CHG.   2. If you chose to wash your hair, wash your hair first as usual with your normal shampoo.  3. After you shampoo, rinse your hair and body thoroughly to remove the shampoo.  4. Use CHG as you would any other liquid soap. You can apply CHG directly to the skin and wash gently with a scrungie or a clean washcloth.   5. Apply the CHG Soap to your body ONLY FROM THE NECK DOWN.  Do not use on open wounds or open sores. Avoid contact with your eyes, ears, mouth and genitals (private parts). Wash Face and genitals (private parts)  with your normal soap.  6. Wash thoroughly, paying special attention to the area where your surgery will be performed.  7. Thoroughly rinse your body with warm water from the neck down.  8. DO NOT shower/wash with your normal soap after using and rinsing off the CHG Soap.  9. Pat yourself dry with a CLEAN TOWEL.  10. Wear CLEAN PAJAMAS to bed the night before surgery, wear comfortable clothes the morning of surgery  11. Place CLEAN SHEETS on your bed the night of your first shower and DO NOT SLEEP WITH PETS.    Day of Surgery:  Do not apply any deodorants/lotions.  Please wear clean clothes to the hospital/surgery center.   Remember to brush your teeth WITH YOUR REGULAR TOOTHPASTE.    Please read over the following fact sheets that you were given. Coughing and Deep Breathing, Total Joint Packet, MRSA Information and Surgical Site Infection Prevention

## 2017-10-01 ENCOUNTER — Encounter (HOSPITAL_COMMUNITY): Payer: Self-pay

## 2017-10-01 ENCOUNTER — Other Ambulatory Visit: Payer: Self-pay

## 2017-10-01 ENCOUNTER — Encounter (HOSPITAL_COMMUNITY)
Admission: RE | Admit: 2017-10-01 | Discharge: 2017-10-01 | Disposition: A | Discharge status: Home / Self Care | Payer: No Typology Code available for payment source | Source: Ambulatory Visit | Attending: Orthopaedic Surgery | Admitting: Orthopaedic Surgery

## 2017-10-01 DIAGNOSIS — Z01812 Encounter for preprocedural laboratory examination: Secondary | ICD-10-CM | POA: Insufficient documentation

## 2017-10-01 HISTORY — DX: Nausea with vomiting, unspecified: R11.2

## 2017-10-01 HISTORY — DX: Other specified postprocedural states: Z98.890

## 2017-10-01 HISTORY — DX: Prediabetes: R73.03

## 2017-10-01 HISTORY — DX: Personal history of other diseases of the digestive system: Z87.19

## 2017-10-01 LAB — BASIC METABOLIC PANEL
Anion gap: 8 (ref 5–15)
BUN: 12 mg/dL (ref 6–20)
CO2: 27 mmol/L (ref 22–32)
Calcium: 9.1 mg/dL (ref 8.9–10.3)
Chloride: 106 mmol/L (ref 101–111)
Creatinine, Ser: 0.94 mg/dL (ref 0.44–1.00)
GFR calc Af Amer: >60 mL/min (ref >60)
GFR calc non Af Amer: >60 mL/min (ref >60)
Glucose, Bld: 128 mg/dL — ABNORMAL HIGH (ref 65–99)
Potassium: 4.4 mmol/L (ref 3.5–5.1)
Sodium: 141 mmol/L (ref 135–145)

## 2017-10-01 LAB — CBC
HCT: 40.8 % (ref 36.0–46.0)
Hemoglobin: 13.2 g/dL (ref 12.0–15.0)
MCH: 26.8 pg (ref 26.0–34.0)
MCHC: 32.4 g/dL (ref 30.0–36.0)
MCV: 82.9 fL (ref 78.0–100.0)
Platelets: 215 10*3/uL (ref 150–400)
RBC: 4.92 MIL/uL (ref 3.87–5.11)
RDW: 13 % (ref 11.5–15.5)
WBC: 6.3 10*3/uL (ref 4.0–10.5)

## 2017-10-01 LAB — HEMOGLOBIN A1C
Hgb A1c MFr Bld: 6.2 % — ABNORMAL HIGH (ref 4.8–5.6)
Mean Plasma Glucose: 131.24 mg/dL

## 2017-10-01 LAB — SURGICAL PCR SCREEN
MRSA, PCR: NEGATIVE
Staphylococcus aureus: NEGATIVE

## 2017-10-01 NOTE — Progress Notes (Signed)
Spoke with [name] at Dr. Trevor Mace office who stated patient did not need to stop her baby aspirin.

## 2017-10-12 MED ORDER — DEXTROSE 5 % IV SOLN
3.0000 g | INTRAVENOUS | Status: AC
  Administered 2017-10-13: 3 g via INTRAVENOUS
  Filled 2017-10-12: qty 3

## 2017-10-13 ENCOUNTER — Inpatient Hospital Stay (HOSPITAL_COMMUNITY): Payer: No Typology Code available for payment source

## 2017-10-13 ENCOUNTER — Encounter (HOSPITAL_COMMUNITY)
Admission: RE | Disposition: A | Discharge status: Skilled Nursing Facility | Payer: Self-pay | Source: Ambulatory Visit | Attending: Orthopaedic Surgery

## 2017-10-13 ENCOUNTER — Inpatient Hospital Stay (HOSPITAL_COMMUNITY): Payer: No Typology Code available for payment source | Admitting: Anesthesiology

## 2017-10-13 ENCOUNTER — Inpatient Hospital Stay (HOSPITAL_COMMUNITY)
Admission: RE | Admit: 2017-10-13 | Discharge: 2017-10-16 | DRG: 470 | Disposition: A | Discharge status: Skilled Nursing Facility | Payer: No Typology Code available for payment source | Source: Ambulatory Visit | Attending: Orthopaedic Surgery | Admitting: Orthopaedic Surgery

## 2017-10-13 ENCOUNTER — Encounter (HOSPITAL_COMMUNITY): Payer: Self-pay | Admitting: Anesthesiology

## 2017-10-13 DIAGNOSIS — Z86711 Personal history of pulmonary embolism: Secondary | ICD-10-CM

## 2017-10-13 DIAGNOSIS — Z881 Allergy status to other antibiotic agents status: Secondary | ICD-10-CM | POA: Diagnosis not present

## 2017-10-13 DIAGNOSIS — K589 Irritable bowel syndrome without diarrhea: Secondary | ICD-10-CM | POA: Diagnosis present

## 2017-10-13 DIAGNOSIS — E1151 Type 2 diabetes mellitus with diabetic peripheral angiopathy without gangrene: Secondary | ICD-10-CM | POA: Diagnosis present

## 2017-10-13 DIAGNOSIS — Z90710 Acquired absence of both cervix and uterus: Secondary | ICD-10-CM

## 2017-10-13 DIAGNOSIS — Z96651 Presence of right artificial knee joint: Secondary | ICD-10-CM | POA: Diagnosis present

## 2017-10-13 DIAGNOSIS — Z8041 Family history of malignant neoplasm of ovary: Secondary | ICD-10-CM | POA: Diagnosis not present

## 2017-10-13 DIAGNOSIS — I480 Paroxysmal atrial fibrillation: Secondary | ICD-10-CM | POA: Diagnosis present

## 2017-10-13 DIAGNOSIS — J302 Other seasonal allergic rhinitis: Secondary | ICD-10-CM | POA: Diagnosis present

## 2017-10-13 DIAGNOSIS — K219 Gastro-esophageal reflux disease without esophagitis: Secondary | ICD-10-CM | POA: Diagnosis present

## 2017-10-13 DIAGNOSIS — Z91018 Allergy to other foods: Secondary | ICD-10-CM | POA: Diagnosis not present

## 2017-10-13 DIAGNOSIS — Z9049 Acquired absence of other specified parts of digestive tract: Secondary | ICD-10-CM | POA: Diagnosis not present

## 2017-10-13 DIAGNOSIS — K2270 Barrett's esophagus without dysplasia: Secondary | ICD-10-CM | POA: Diagnosis present

## 2017-10-13 DIAGNOSIS — Z96652 Presence of left artificial knee joint: Secondary | ICD-10-CM

## 2017-10-13 DIAGNOSIS — Z6841 Body Mass Index (BMI) 40.0 and over, adult: Secondary | ICD-10-CM | POA: Diagnosis not present

## 2017-10-13 DIAGNOSIS — M1712 Unilateral primary osteoarthritis, left knee: Principal | ICD-10-CM | POA: Diagnosis present

## 2017-10-13 DIAGNOSIS — J9811 Atelectasis: Secondary | ICD-10-CM | POA: Diagnosis not present

## 2017-10-13 DIAGNOSIS — M25762 Osteophyte, left knee: Secondary | ICD-10-CM | POA: Diagnosis present

## 2017-10-13 DIAGNOSIS — Z981 Arthrodesis status: Secondary | ICD-10-CM | POA: Diagnosis not present

## 2017-10-13 DIAGNOSIS — K449 Diaphragmatic hernia without obstruction or gangrene: Secondary | ICD-10-CM | POA: Diagnosis present

## 2017-10-13 DIAGNOSIS — Z86010 Personal history of colonic polyps: Secondary | ICD-10-CM

## 2017-10-13 DIAGNOSIS — Z8349 Family history of other endocrine, nutritional and metabolic diseases: Secondary | ICD-10-CM

## 2017-10-13 HISTORY — PX: TOTAL KNEE ARTHROPLASTY: SHX125

## 2017-10-13 LAB — GLUCOSE, CAPILLARY
Glucose-Capillary: 132 mg/dL — ABNORMAL HIGH (ref 70–99)
Glucose-Capillary: 132 mg/dL — ABNORMAL HIGH (ref 70–99)

## 2017-10-13 SURGERY — ARTHROPLASTY, KNEE, TOTAL
Anesthesia: General | Site: Knee | Laterality: Left

## 2017-10-13 MED ORDER — METHOCARBAMOL 1000 MG/10ML IJ SOLN
500.0000 mg | Freq: Four times a day (QID) | INTRAVENOUS | Status: DC | PRN
  Filled 2017-10-13: qty 5

## 2017-10-13 MED ORDER — ACETAMINOPHEN 325 MG PO TABS
325.0000 mg | ORAL_TABLET | Freq: Four times a day (QID) | ORAL | Status: DC | PRN
  Administered 2017-10-14 – 2017-10-16 (×5): 650 mg via ORAL
  Filled 2017-10-13 (×5): qty 2

## 2017-10-13 MED ORDER — HYDROMORPHONE HCL 1 MG/ML IJ SOLN
INTRAMUSCULAR | Status: AC
  Filled 2017-10-13: qty 1

## 2017-10-13 MED ORDER — VITAMIN D 1000 UNITS PO TABS
1000.0000 [IU] | ORAL_TABLET | Freq: Every evening | ORAL | Status: DC
  Administered 2017-10-13 – 2017-10-15 (×3): 1000 [IU] via ORAL
  Filled 2017-10-13 (×3): qty 1

## 2017-10-13 MED ORDER — LACTATED RINGERS IV SOLN
INTRAVENOUS | Status: DC
  Administered 2017-10-13 (×2): via INTRAVENOUS

## 2017-10-13 MED ORDER — ONDANSETRON HCL 4 MG/2ML IJ SOLN: 4.0000 mg | Freq: Four times a day (QID) | INTRAMUSCULAR | Status: DC | PRN

## 2017-10-13 MED ORDER — FENTANYL CITRATE (PF) 250 MCG/5ML IJ SOLN
INTRAMUSCULAR | Status: AC
  Filled 2017-10-13: qty 5

## 2017-10-13 MED ORDER — ONDANSETRON HCL 4 MG/2ML IJ SOLN
INTRAMUSCULAR | Status: AC
  Filled 2017-10-13: qty 2

## 2017-10-13 MED ORDER — HYDROMORPHONE HCL 1 MG/ML IJ SOLN
0.5000 mg | INTRAMUSCULAR | Status: DC | PRN
  Administered 2017-10-13: 0.5 mg via INTRAVENOUS

## 2017-10-13 MED ORDER — RIVAROXABAN 10 MG PO TABS
10.0000 mg | ORAL_TABLET | Freq: Every day | ORAL | Status: DC
  Administered 2017-10-14 – 2017-10-16 (×3): 10 mg via ORAL
  Filled 2017-10-13 (×3): qty 1

## 2017-10-13 MED ORDER — ALUM & MAG HYDROXIDE-SIMETH 200-200-20 MG/5ML PO SUSP: 30.0000 mL | ORAL | Status: DC | PRN

## 2017-10-13 MED ORDER — MEPERIDINE HCL 50 MG/ML IJ SOLN: 6.2500 mg | INTRAMUSCULAR | Status: DC | PRN

## 2017-10-13 MED ORDER — MENTHOL 3 MG MT LOZG: 1.0000 | LOZENGE | OROMUCOSAL | Status: DC | PRN

## 2017-10-13 MED ORDER — MIDAZOLAM HCL 2 MG/2ML IJ SOLN
INTRAMUSCULAR | Status: AC
  Administered 2017-10-13: 1 mg
  Filled 2017-10-13: qty 2

## 2017-10-13 MED ORDER — DIPHENHYDRAMINE HCL 12.5 MG/5ML PO ELIX: 12.5000 mg | ORAL_SOLUTION | ORAL | Status: DC | PRN

## 2017-10-13 MED ORDER — METOCLOPRAMIDE HCL 5 MG/ML IJ SOLN: 5.0000 mg | Freq: Three times a day (TID) | INTRAMUSCULAR | Status: DC | PRN

## 2017-10-13 MED ORDER — MIDAZOLAM HCL 5 MG/5ML IJ SOLN
INTRAMUSCULAR | Status: DC | PRN
  Administered 2017-10-13 (×2): 1 mg via INTRAVENOUS

## 2017-10-13 MED ORDER — METHOCARBAMOL 500 MG PO TABS
ORAL_TABLET | ORAL | Status: AC
  Filled 2017-10-13: qty 1

## 2017-10-13 MED ORDER — ROPIVACAINE HCL 7.5 MG/ML IJ SOLN
INTRAMUSCULAR | Status: DC | PRN
  Administered 2017-10-13: 20 mL via PERINEURAL

## 2017-10-13 MED ORDER — DEXAMETHASONE SODIUM PHOSPHATE 10 MG/ML IJ SOLN
INTRAMUSCULAR | Status: DC | PRN
  Administered 2017-10-13: 10 mg via INTRAVENOUS

## 2017-10-13 MED ORDER — ONDANSETRON HCL 4 MG/2ML IJ SOLN
INTRAMUSCULAR | Status: DC | PRN
  Administered 2017-10-13: 4 mg via INTRAVENOUS

## 2017-10-13 MED ORDER — FENTANYL CITRATE (PF) 100 MCG/2ML IJ SOLN
INTRAMUSCULAR | Status: DC | PRN
  Administered 2017-10-13 (×2): 50 ug via INTRAVENOUS
  Administered 2017-10-13: 100 ug via INTRAVENOUS
  Administered 2017-10-13: 50 ug via INTRAVENOUS
  Administered 2017-10-13: 150 ug via INTRAVENOUS
  Administered 2017-10-13: 100 ug via INTRAVENOUS

## 2017-10-13 MED ORDER — PHENOL 1.4 % MT LIQD: 1.0000 | OROMUCOSAL | Status: DC | PRN

## 2017-10-13 MED ORDER — 0.9 % SODIUM CHLORIDE (POUR BTL) OPTIME
TOPICAL | Status: DC | PRN
  Administered 2017-10-13: 1000 mL

## 2017-10-13 MED ORDER — NEOSTIGMINE METHYLSULFATE 10 MG/10ML IV SOLN
INTRAVENOUS | Status: DC | PRN
  Administered 2017-10-13: 4 mg via INTRAVENOUS

## 2017-10-13 MED ORDER — LIDOCAINE 2% (20 MG/ML) 5 ML SYRINGE
INTRAMUSCULAR | Status: AC
  Filled 2017-10-13: qty 5

## 2017-10-13 MED ORDER — GLYCOPYRROLATE PF 0.2 MG/ML IJ SOSY
PREFILLED_SYRINGE | INTRAMUSCULAR | Status: AC
  Filled 2017-10-13: qty 2

## 2017-10-13 MED ORDER — DOCUSATE SODIUM 100 MG PO CAPS
100.0000 mg | ORAL_CAPSULE | Freq: Two times a day (BID) | ORAL | Status: DC
  Administered 2017-10-15 – 2017-10-16 (×2): 100 mg via ORAL
  Filled 2017-10-13 (×4): qty 1

## 2017-10-13 MED ORDER — METOCLOPRAMIDE HCL 5 MG/ML IJ SOLN: 10.0000 mg | Freq: Once | INTRAMUSCULAR | Status: DC | PRN

## 2017-10-13 MED ORDER — OXYCODONE HCL 5 MG PO TABS
5.0000 mg | ORAL_TABLET | ORAL | Status: DC | PRN
  Administered 2017-10-13: 10 mg via ORAL

## 2017-10-13 MED ORDER — OXYCODONE HCL 5 MG PO TABS
ORAL_TABLET | ORAL | Status: AC
  Filled 2017-10-13: qty 2

## 2017-10-13 MED ORDER — FENTANYL CITRATE (PF) 100 MCG/2ML IJ SOLN
INTRAMUSCULAR | Status: AC
  Administered 2017-10-13: 75 ug
  Filled 2017-10-13: qty 2

## 2017-10-13 MED ORDER — POLYETHYLENE GLYCOL 3350 17 G PO PACK: 17.0000 g | PACK | Freq: Every day | ORAL | Status: DC | PRN

## 2017-10-13 MED ORDER — SCOPOLAMINE 1 MG/3DAYS TD PT72
1.0000 | MEDICATED_PATCH | TRANSDERMAL | Status: DC
  Administered 2017-10-13: 1.5 mg via TRANSDERMAL
  Filled 2017-10-13: qty 1

## 2017-10-13 MED ORDER — MIDAZOLAM HCL 2 MG/2ML IJ SOLN
INTRAMUSCULAR | Status: AC
  Filled 2017-10-13: qty 2

## 2017-10-13 MED ORDER — HYDROMORPHONE HCL 1 MG/ML IJ SOLN
0.2500 mg | INTRAMUSCULAR | Status: DC | PRN
  Administered 2017-10-13 (×6): 0.5 mg via INTRAVENOUS

## 2017-10-13 MED ORDER — PROPOFOL 10 MG/ML IV BOLUS
INTRAVENOUS | Status: DC | PRN
  Administered 2017-10-13: 20 mg via INTRAVENOUS
  Administered 2017-10-13: 180 mg via INTRAVENOUS

## 2017-10-13 MED ORDER — NEOSTIGMINE METHYLSULFATE 5 MG/5ML IV SOSY
PREFILLED_SYRINGE | INTRAVENOUS | Status: AC
  Filled 2017-10-13: qty 5

## 2017-10-13 MED ORDER — METOCLOPRAMIDE HCL 5 MG PO TABS: 5.0000 mg | ORAL_TABLET | Freq: Three times a day (TID) | ORAL | Status: DC | PRN

## 2017-10-13 MED ORDER — SODIUM CHLORIDE 0.9 % IV SOLN
INTRAVENOUS | Status: DC
  Administered 2017-10-13: 14:00:00 via INTRAVENOUS

## 2017-10-13 MED ORDER — PANTOPRAZOLE SODIUM 40 MG PO TBEC
40.0000 mg | DELAYED_RELEASE_TABLET | Freq: Every day | ORAL | Status: DC
  Administered 2017-10-13 – 2017-10-14 (×2): 40 mg via ORAL
  Filled 2017-10-13 (×2): qty 1

## 2017-10-13 MED ORDER — METHOCARBAMOL 500 MG PO TABS
500.0000 mg | ORAL_TABLET | Freq: Four times a day (QID) | ORAL | Status: DC | PRN
Start: 2017-10-13 — End: 2017-10-16
  Administered 2017-10-13 – 2017-10-16 (×5): 500 mg via ORAL
  Filled 2017-10-13 (×5): qty 1

## 2017-10-13 MED ORDER — ROCURONIUM BROMIDE 100 MG/10ML IV SOLN
INTRAVENOUS | Status: DC | PRN
  Administered 2017-10-13: 50 mg via INTRAVENOUS

## 2017-10-13 MED ORDER — GLYCOPYRROLATE 0.2 MG/ML IJ SOLN
INTRAMUSCULAR | Status: DC | PRN
  Administered 2017-10-13: 0.4 mg via INTRAVENOUS

## 2017-10-13 MED ORDER — ONDANSETRON HCL 4 MG PO TABS: 4.0000 mg | ORAL_TABLET | Freq: Four times a day (QID) | ORAL | Status: DC | PRN

## 2017-10-13 MED ORDER — CEFAZOLIN SODIUM-DEXTROSE 2-4 GM/100ML-% IV SOLN
2.0000 g | Freq: Four times a day (QID) | INTRAVENOUS | Status: AC
  Administered 2017-10-13 (×2): 2 g via INTRAVENOUS
  Filled 2017-10-13 (×2): qty 100

## 2017-10-13 MED ORDER — OXYCODONE HCL 5 MG PO TABS
10.0000 mg | ORAL_TABLET | ORAL | Status: DC | PRN
  Administered 2017-10-13: 15 mg via ORAL
  Administered 2017-10-13: 10 mg via ORAL
  Administered 2017-10-14 – 2017-10-16 (×9): 15 mg via ORAL
  Filled 2017-10-13 (×3): qty 3
  Filled 2017-10-13: qty 2
  Filled 2017-10-13 (×7): qty 3

## 2017-10-13 MED ORDER — CHLORHEXIDINE GLUCONATE 4 % EX LIQD: 60.0000 mL | Freq: Once | CUTANEOUS | Status: DC

## 2017-10-13 MED ORDER — HYDROMORPHONE HCL 2 MG/ML IJ SOLN
0.5000 mg | INTRAMUSCULAR | Status: DC | PRN
  Administered 2017-10-13 – 2017-10-15 (×4): 1 mg via INTRAVENOUS
  Filled 2017-10-13 (×4): qty 1

## 2017-10-13 MED ORDER — ROCURONIUM BROMIDE 50 MG/5ML IV SOLN
INTRAVENOUS | Status: AC
  Filled 2017-10-13: qty 1

## 2017-10-13 MED ORDER — SODIUM CHLORIDE 0.9 % IR SOLN
Status: DC | PRN
  Administered 2017-10-13: 3000 mL

## 2017-10-13 MED ORDER — DEXAMETHASONE SODIUM PHOSPHATE 10 MG/ML IJ SOLN
INTRAMUSCULAR | Status: AC
  Filled 2017-10-13: qty 1

## 2017-10-13 MED ORDER — PROPOFOL 10 MG/ML IV BOLUS
INTRAVENOUS | Status: AC
  Filled 2017-10-13: qty 20

## 2017-10-13 SURGICAL SUPPLY — 74 items
BANDAGE ACE 6X5 VEL STRL LF (GAUZE/BANDAGES/DRESSINGS) ×4 IMPLANT
BANDAGE ESMARK 6X9 LF (GAUZE/BANDAGES/DRESSINGS) ×1 IMPLANT
BASEPLATE TIBIAL UNV SZ3 (Orthopedic Implant) ×2 IMPLANT
BEARIN INSERT TIBIAL 11 (Orthopedic Implant) ×2 IMPLANT
BEARING INSERT TIBIAL 11 (Orthopedic Implant) ×1 IMPLANT
BLADE SAG 18X100X1.27 (BLADE) ×2 IMPLANT
BNDG CMPR 9X6 STRL LF SNTH (GAUZE/BANDAGES/DRESSINGS) ×1
BNDG ESMARK 6X9 LF (GAUZE/BANDAGES/DRESSINGS) ×2
BOWL SMART MIX CTS (DISPOSABLE) ×2 IMPLANT
CEMENT BONE SIMPLEX SPEEDSET (Cement) ×4 IMPLANT
COVER SURGICAL LIGHT HANDLE (MISCELLANEOUS) ×2 IMPLANT
CUFF TOURNIQUET SINGLE 34IN LL (TOURNIQUET CUFF) ×2 IMPLANT
CUFF TOURNIQUET SINGLE 44IN (TOURNIQUET CUFF) IMPLANT
DRAPE EXTREMITY T 121X128X90 (DRAPE) ×2 IMPLANT
DRAPE HALF SHEET 40X57 (DRAPES) ×2 IMPLANT
DRAPE ORTHO SPLIT 77X108 STRL (DRAPES) ×1
DRAPE SURG ORHT 6 SPLT 77X108 (DRAPES) ×1 IMPLANT
DRAPE U-SHAPE 47X51 STRL (DRAPES) ×2 IMPLANT
DRSG PAD ABDOMINAL 8X10 ST (GAUZE/BANDAGES/DRESSINGS) IMPLANT
DURAPREP 26ML APPLICATOR (WOUND CARE) ×4 IMPLANT
ELECT CAUTERY BLADE 6.4 (BLADE) ×2 IMPLANT
ELECT REM PT RETURN 9FT ADLT (ELECTROSURGICAL) ×2
ELECTRODE REM PT RTRN 9FT ADLT (ELECTROSURGICAL) ×1 IMPLANT
FACESHIELD WRAPAROUND (MASK) ×4 IMPLANT
FEMORAL PEG DISTAL FIXATION (Orthopedic Implant) ×2 IMPLANT
FEMORAL TRIATH POST STAB  SZ3 (Orthopedic Implant) ×1 IMPLANT
FEMORAL TRIATH POST STAB SZ3 (Orthopedic Implant) ×1 IMPLANT
GAUZE SPONGE 4X4 12PLY STRL (GAUZE/BANDAGES/DRESSINGS) ×2 IMPLANT
GAUZE XEROFORM 1X8 LF (GAUZE/BANDAGES/DRESSINGS) ×2 IMPLANT
GLOVE BIOGEL PI IND STRL 7.0 (GLOVE) ×3 IMPLANT
GLOVE BIOGEL PI IND STRL 8 (GLOVE) ×2 IMPLANT
GLOVE BIOGEL PI INDICATOR 7.0 (GLOVE) ×3
GLOVE BIOGEL PI INDICATOR 8 (GLOVE) ×2
GLOVE ORTHO TXT STRL SZ7.5 (GLOVE) ×4 IMPLANT
GLOVE SURG ORTHO 8.0 STRL STRW (GLOVE) ×4 IMPLANT
GOWN STRL REUS W/ TWL LRG LVL3 (GOWN DISPOSABLE) ×2 IMPLANT
GOWN STRL REUS W/ TWL XL LVL3 (GOWN DISPOSABLE) ×2 IMPLANT
GOWN STRL REUS W/TWL LRG LVL3 (GOWN DISPOSABLE) ×4
GOWN STRL REUS W/TWL XL LVL3 (GOWN DISPOSABLE) ×4
HANDPIECE INTERPULSE COAX TIP (DISPOSABLE) ×2
IMMOBILIZER KNEE 20 (SOFTGOODS) ×2
IMMOBILIZER KNEE 20 THIGH 36 (SOFTGOODS) ×1 IMPLANT
IMMOBILIZER KNEE 22 UNIV (SOFTGOODS) ×2 IMPLANT
KIT BASIN OR (CUSTOM PROCEDURE TRAY) ×2 IMPLANT
KIT TURNOVER KIT B (KITS) ×2 IMPLANT
MANIFOLD NEPTUNE II (INSTRUMENTS) ×2 IMPLANT
NDL SAFETY ECLIPSE 18X1.5 (NEEDLE) IMPLANT
NEEDLE HYPO 18GX1.5 SHARP (NEEDLE)
NS IRRIG 1000ML POUR BTL (IV SOLUTION) ×2 IMPLANT
PACK TOTAL JOINT (CUSTOM PROCEDURE TRAY) ×2 IMPLANT
PAD ABD 8X10 STRL (GAUZE/BANDAGES/DRESSINGS) ×2 IMPLANT
PAD ARMBOARD 7.5X6 YLW CONV (MISCELLANEOUS) ×2 IMPLANT
PADDING CAST COTTON 6X4 STRL (CAST SUPPLIES) ×2 IMPLANT
PATELLA 32MMX10MM (Knees) ×2 IMPLANT
SET HNDPC FAN SPRY TIP SCT (DISPOSABLE) ×1 IMPLANT
SET PAD KNEE POSITIONER (MISCELLANEOUS) ×2 IMPLANT
STAPLER VISISTAT 35W (STAPLE) ×2 IMPLANT
STRIP CLOSURE SKIN 1/2X4 (GAUZE/BANDAGES/DRESSINGS) IMPLANT
SUCTION FRAZIER HANDLE 10FR (MISCELLANEOUS) ×1
SUCTION TUBE FRAZIER 10FR DISP (MISCELLANEOUS) ×1 IMPLANT
SUT MNCRL AB 4-0 PS2 18 (SUTURE) IMPLANT
SUT VIC AB 0 CT1 27 (SUTURE) ×6
SUT VIC AB 0 CT1 27XBRD ANBCTR (SUTURE) ×3 IMPLANT
SUT VIC AB 1 CT1 27 (SUTURE) ×2
SUT VIC AB 1 CT1 27XBRD ANBCTR (SUTURE) ×1 IMPLANT
SUT VIC AB 2-0 CT1 27 (SUTURE) ×4
SUT VIC AB 2-0 CT1 TAPERPNT 27 (SUTURE) ×2 IMPLANT
SYR 50ML LL SCALE MARK (SYRINGE) IMPLANT
TOWEL OR 17X24 6PK STRL BLUE (TOWEL DISPOSABLE) IMPLANT
TOWEL OR 17X26 10 PK STRL BLUE (TOWEL DISPOSABLE) ×2 IMPLANT
TRAY CATH 16FR W/PLASTIC CATH (SET/KITS/TRAYS/PACK) IMPLANT
TRAY FOLEY CATH 16FRSI W/METER (SET/KITS/TRAYS/PACK) ×2 IMPLANT
WRAP KNEE MAXI GEL POST OP (GAUZE/BANDAGES/DRESSINGS) ×2 IMPLANT
YANKAUER SUCT BULB TIP NO VENT (SUCTIONS) ×2 IMPLANT

## 2017-10-13 NOTE — Anesthesia Postprocedure Evaluation (Signed)
Anesthesia Post Note  Patient: ALISYN LEQUIRE  Procedure(s) Performed: LEFT TOTAL KNEE ARTHROPLASTY (Left Knee)     Patient location during evaluation: PACU Anesthesia Type: General Level of consciousness: awake and alert and oriented Pain management: pain level controlled Vital Signs Assessment: post-procedure vital signs reviewed and stable Respiratory status: spontaneous breathing, nonlabored ventilation, respiratory function stable and patient connected to nasal cannula oxygen Cardiovascular status: blood pressure returned to baseline and stable Postop Assessment: no apparent nausea or vomiting Anesthetic complications: no    Last Vitals:  Vitals:   10/13/17 1215 10/13/17 1225  BP:  (!) 171/77  Pulse: (!) 53 (!) 56  Resp: 14 16  Temp:    SpO2: 100% 100%    Last Pain:  Vitals:   10/13/17 1225  TempSrc:   PainSc: 10-Worst pain ever                 Annalissa Murphey A.

## 2017-10-13 NOTE — Brief Op Note (Signed)
10/13/2017  10:52 AM  PATIENT:  [name]  67 y.o. female  PRE-OPERATIVE DIAGNOSIS:  severe osteoarthritis left knee  POST-OPERATIVE DIAGNOSIS:  severe osteoarthritis left knee  PROCEDURE:  Procedure(s): LEFT TOTAL KNEE ARTHROPLASTY (Left)  SURGEON:  Surgeon(s) and Role:    Mcarthur Rossetti, MD - Primary  PHYSICIAN ASSISTANT: Benita Stabile, PA-C  ANESTHESIA:   regional and general  EBL:  125 mL   COUNTS:  YES  TOURNIQUET:   Total Tourniquet Time Documented: Thigh (Left) - 59 minutes Total: Thigh (Left) - 59 minutes   DICTATION: .Other Dictation: Dictation Number [id number]  PLAN OF CARE: Admit to inpatient   PATIENT DISPOSITION:  PACU - hemodynamically stable.   Delay start of Pharmacological VTE agent (>24hrs) due to surgical blood loss or risk of bleeding: no

## 2017-10-13 NOTE — Plan of Care (Signed)

## 2017-10-13 NOTE — Discharge Instructions (Addendum)
Information on my medicine - XARELTO® (Rivaroxaban) ° °This medication education was reviewed with me or my healthcare representative as part of my discharge preparation.  The pharmacist that spoke with me during my hospital stay was:  Dang, Thuy Dien, RPH ° °Why was Xarelto® prescribed for you? °Xarelto® was prescribed for you to reduce the risk of blood clots forming after orthopedic surgery. The medical term for these abnormal blood clots is venous thromboembolism (VTE). ° °What do you need to know about xarelto® ? °Take your Xarelto® ONCE DAILY at the same time every day. °You may take it either with or without food. ° °If you have difficulty swallowing the tablet whole, you may crush it and mix in applesauce just prior to taking your dose. ° °Take Xarelto® exactly as prescribed by your doctor and DO NOT stop taking Xarelto® without talking to the doctor who prescribed the medication.  Stopping without other VTE prevention medication to take the place of Xarelto® may increase your risk of developing a clot. ° °After discharge, you should have regular check-up appointments with your healthcare provider that is prescribing your Xarelto®.   ° °What do you do if you miss a dose? °If you miss a dose, take it as soon as you remember on the same day then continue your regularly scheduled once daily regimen the next day. Do not take two doses of Xarelto® on the same day.  ° °Important Safety Information °A possible side effect of Xarelto® is bleeding. You should call your healthcare provider right away if you experience any of the following: °? Bleeding from an injury or your nose that does not stop. °? Unusual colored urine (red or dark brown) or unusual colored stools (red or black). °? Unusual bruising for unknown reasons. °? A serious fall or if you hit your head (even if there is no bleeding). ° °Some medicines may interact with Xarelto® and might increase your risk of bleeding while on Xarelto®. To help avoid  this, consult your healthcare provider or pharmacist prior to using any new prescription or non-prescription medications, including herbals, vitamins, non-steroidal anti-inflammatory drugs (NSAIDs) and supplements. ° °This website has more information on Xarelto®: www.xarelto.com. ° ° °INSTRUCTIONS AFTER JOINT REPLACEMENT  ° °o Remove items at home which could result in a fall. This includes throw rugs or furniture in walking pathways °o ICE to the affected joint every three hours while awake for 30 minutes at a time, for at least the first 3-5 days, and then as needed for pain and swelling.  Continue to use ice for pain and swelling. You may notice swelling that will progress down to the foot and ankle.  This is normal after surgery.  Elevate your leg when you are not up walking on it.   °o Continue to use the breathing machine you got in the hospital (incentive spirometer) which will help keep your temperature down.  It is common for your temperature to cycle up and down following surgery, especially at night when you are not up moving around and exerting yourself.  The breathing machine keeps your lungs expanded and your temperature down. ° ° °DIET:  As you were doing prior to hospitalization, we recommend a well-balanced diet. ° °DRESSING / WOUND CARE / SHOWERING ° °Keep the surgical dressing until follow up.  The dressing is water proof, so you can shower without any extra covering.  IF THE DRESSING FALLS OFF or the wound gets wet inside, change the dressing with sterile gauze.    Please use good hand washing techniques before changing the dressing.  Do not use any lotions or creams on the incision until instructed by your surgeon.   ° °ACTIVITY ° °o Increase activity slowly as tolerated, but follow the weight bearing instructions below.   °o No driving for 6 weeks or until further direction given by your physician.  You cannot drive while taking narcotics.  °o No lifting or carrying greater than 10 lbs. until  further directed by your surgeon. °o Avoid periods of inactivity such as sitting longer than an hour when not asleep. This helps prevent blood clots.  °o You may return to work once you are authorized by your doctor.  ° ° ° °WEIGHT BEARING  ° °Weight bearing as tolerated with assist device (walker, cane, etc) as directed, use it as long as suggested by your surgeon or therapist, typically at least 4-6 weeks. ° ° °EXERCISES ° °Results after joint replacement surgery are often greatly improved when you follow the exercise, range of motion and muscle strengthening exercises prescribed by your doctor. Safety measures are also important to protect the joint from further injury. Any time any of these exercises cause you to have increased pain or swelling, decrease what you are doing until you are comfortable again and then slowly increase them. If you have problems or questions, call your caregiver or physical therapist for advice.  ° °Rehabilitation is important following a joint replacement. After just a few days of immobilization, the muscles of the leg can become weakened and shrink (atrophy).  These exercises are designed to build up the tone and strength of the thigh and leg muscles and to improve motion. Often times heat used for twenty to thirty minutes before working out will loosen up your tissues and help with improving the range of motion but do not use heat for the first two weeks following surgery (sometimes heat can increase post-operative swelling).  ° °These exercises can be done on a training (exercise) mat, on the floor, on a table or on a bed. Use whatever works the best and is most comfortable for you.    Use music or television while you are exercising so that the exercises are a pleasant break in your day. This will make your life better with the exercises acting as a break in your routine that you can look forward to.   Perform all exercises about fifteen times, three times per day or as directed.   You should exercise both the operative leg and the other leg as well. ° °Exercises include: °  °• Quad Sets - Tighten up the muscle on the front of the thigh (Quad) and hold for 5-10 seconds.   °• Straight Leg Raises - With your knee straight (if you were given a brace, keep it on), lift the leg to 60 degrees, hold for 3 seconds, and slowly lower the leg.  Perform this exercise against resistance later as your leg gets stronger.  °• Leg Slides: Lying on your back, slowly slide your foot toward your buttocks, bending your knee up off the floor (only go as far as is comfortable). Then slowly slide your foot back down until your leg is flat on the floor again.  °• Angel Wings: Lying on your back spread your legs to the side as far apart as you can without causing discomfort.  °• Hamstring Strength:  Lying on your back, push your heel against the floor with your leg straight by tightening up the   muscles of your buttocks.  Repeat, but this time bend your knee to a comfortable angle, and push your heel against the floor.  You may put a pillow under the heel to make it more comfortable if necessary.  ° °A rehabilitation program following joint replacement surgery can speed recovery and prevent re-injury in the future due to weakened muscles. Contact your doctor or a physical therapist for more information on knee rehabilitation.  ° ° °CONSTIPATION ° °Constipation is defined medically as fewer than three stools per week and severe constipation as less than one stool per week.  Even if you have a regular bowel pattern at home, your normal regimen is likely to be disrupted due to multiple reasons following surgery.  Combination of anesthesia, postoperative narcotics, change in appetite and fluid intake all can affect your bowels.  ° °YOU MUST use at least one of the following options; they are listed in order of increasing strength to get the job done.  They are all available over the counter, and you may need to use some,  POSSIBLY even all of these options:   ° °Drink plenty of fluids (prune juice may be helpful) and high fiber foods °Colace 100 mg by mouth twice a day  °Senokot for constipation as directed and as needed Dulcolax (bisacodyl), take with full glass of water  °Miralax (polyethylene glycol) once or twice a day as needed. ° °If you have tried all these things and are unable to have a bowel movement in the first 3-4 days after surgery call either your surgeon or your primary doctor.   ° °If you experience loose stools or diarrhea, hold the medications until you stool forms back up.  If your symptoms do not get better within 1 week or if they get worse, check with your doctor.  If you experience "the worst abdominal pain ever" or develop nausea or vomiting, please contact the office immediately for further recommendations for treatment. ° ° °ITCHING:  If you experience itching with your medications, try taking only a single pain pill, or even half a pain pill at a time.  You can also use Benadryl over the counter for itching or also to help with sleep.  ° °TED HOSE STOCKINGS:  Use stockings on both legs until for at least 2 weeks or as directed by physician office. They may be removed at night for sleeping. ° °MEDICATIONS:  See your medication summary on the “After Visit Summary” that nursing will review with you.  You may have some home medications which will be placed on hold until you complete the course of blood thinner medication.  It is important for you to complete the blood thinner medication as prescribed. ° °PRECAUTIONS:  If you experience chest pain or shortness of breath - call 911 immediately for transfer to the hospital emergency department.  ° °If you develop a fever greater that 101 F, purulent drainage from wound, increased redness or drainage from wound, foul odor from the wound/dressing, or calf pain - CONTACT YOUR SURGEON.   °                                                °FOLLOW-UP APPOINTMENTS:  If  you do not already have a post-op appointment, please call the office for an appointment to be seen by your surgeon.  Guidelines for how   soon to be seen are listed in your “After Visit Summary”, but are typically between 1-4 weeks after surgery. ° °OTHER INSTRUCTIONS:  ° °Knee Replacement:  Do not place pillow under knee, focus on keeping the knee straight while resting. CPM instructions: 0-90 degrees, 2 hours in the morning, 2 hours in the afternoon, and 2 hours in the evening. Place foam block, curve side up under heel at all times except when in CPM or when walking.  DO NOT modify, tear, cut, or change the foam block in any way. ° °MAKE SURE YOU:  °• Understand these instructions.  °• Get help right away if you are not doing well or get worse.  ° ° °Thank you for letting us be a part of your medical care team.  It is a privilege we respect greatly.  We hope these instructions will help you stay on track for a fast and full recovery!  ° °

## 2017-10-13 NOTE — Anesthesia Preprocedure Evaluation (Addendum)
Anesthesia Evaluation  Patient identified by MRN, date of birth, ID band Patient awake    Reviewed: Allergy & Precautions, NPO status , Patient's Chart, lab work & pertinent test results  History of Anesthesia Complications (+) PONV and history of anesthetic complications  Airway Mallampati: III  TM Distance: >3 FB Neck ROM: Full    Dental  (+) Edentulous Upper, Edentulous Lower   Pulmonary shortness of breath and with exertion,    Pulmonary exam normal breath sounds clear to auscultation       Cardiovascular + Peripheral Vascular Disease  Normal cardiovascular exam+ dysrhythmias Atrial Fibrillation + Valvular Problems/Murmurs  Rhythm:Regular Rate:Normal  Echo 06/2716-Normal LV size with EF 55-60%. Mildly dilated RV with normal  systolic function. No significant valvular abnormalities  Hx/o paroxysmal atrial fibrillation  Hx/o DVT and PTE   Neuro/Psych negative neurological ROS  negative psych ROS   GI/Hepatic Neg liver ROS, hiatal hernia, GERD  Medicated and Controlled,IBS   Endo/Other  diabetes, Well Controlled, Type 2Morbid obesity  Renal/GU negative Renal ROS  negative genitourinary   Musculoskeletal  (+) Arthritis , Osteoarthritis,  OA left knee Hx/o previous lumbar spinal fusion   Abdominal (+) + obese,   Peds  Hematology  (+) anemia ,   Anesthesia Other Findings   Reproductive/Obstetrics                           Anesthesia Physical Anesthesia Plan  ASA: III  Anesthesia Plan: Spinal   Post-op Pain Management:  Regional for Post-op pain   Induction:   PONV Risk Score and Plan: 4 or greater and Scopolamine patch - Pre-op, Midazolam, Dexamethasone, Ondansetron and Treatment may vary due to age or medical condition  Airway Management Planned: Natural Airway, Nasal Cannula and Simple Face Mask  Additional Equipment:   Intra-op Plan:   Post-operative Plan:   Informed  Consent: I have reviewed the patients History and Physical, chart, labs and discussed the procedure including the risks, benefits and alternatives for the proposed anesthesia with the patient or authorized representative who has indicated his/her understanding and acceptance.   Dental advisory given  Plan Discussed with: CRNA and Surgeon  Anesthesia Plan Comments:        Anesthesia Quick Evaluation

## 2017-10-13 NOTE — Op Note (Signed)
NAME: [name], [facility] MEDICAL RECORD WU:[id number] ACCOUNT [id number] DATE OF BIRTH:[birth year] FACILITY: [facility] LOCATION: [facility] PHYSICIAN:Natarsha Hurwitz Aretha Parrot, MD  OPERATIVE REPORT  DATE OF PROCEDURE:  10/13/2017  PREOPERATIVE DIAGNOSES: 1.  Primary osteoarthritis and degenerative joint disease, left knee. 2.  Morbid obesity.  POSTOPERATIVE DIAGNOSES:  1.  Primary osteoarthritis and degenerative joint disease, left knee. 2.  Morbid obesity.  PROCEDURE:  Left total knee arthroplasty.  IMPLANTS:  Stryker Triathlon knee system with size 3 cemented femur, size 3 cemented tibia universal baseplate, 11 millimeter fixed bearing polyethylene insert, size 32 cemented patellar button.  SURGEON:  Doneen Poisson, MD  ASSISTANT:  Richardean Canal, PA-C  ANESTHESIA: 1.  Left lower extremity adductor canal block. 2.  Attempted spinal. 3.  General.  TOURNIQUET TIME:  1 hour.  ANTIBIOTICS:  3 grams IV Ancef.  ESTIMATED BLOOD LOSS:  100 mL.  COMPLICATIONS:  None.  INDICATIONS:  The patient is a 67 year old morbidly obese female well known to me.  She has severe debilitating arthritis involving her left knee.  I actually performed a right total knee arthroplasty on her 11 years ago.  Her left knee pain has now  become worse.  She has worked on activity modification and weight loss.  She has tried everything conservatively and this is not helping.  At this point, her pain is daily and it has detrimentally affected her activities of daily living, quality of life,  and her mobility.  She does wish to proceed with a total knee arthroplasty.  She understands the heightened risks given her morbid obesity of acute blood loss anemia, nerve or vessel injury, fracture, infection, DVT and implant failure.  She understands  her goals are to decrease pain and improve mobility and overall improved quality of life.  DESCRIPTION OF PROCEDURE:  After informed consent was obtained, appropriate  left knee was marked.  An adductor canal block was obtained in the holding room.  She was brought to the operating room and sat on the operating table and set up straight and  spinal anesthesia was attempted, but it was difficult due to her obesity and subsequent back surgery in the past.  She was then laid in a supine position and general anesthesia was obtained.  A Foley catheter was placed and a nonsterile tourniquet was  placed around the upper left thigh and her left thigh, knee, leg, ankle and foot were prepped and draped with DuraPrep and sterile drapes.  A time-out was called to identify correct patient, correct left knee.  We then used the Esmarch to wrap the leg  and tourniquet was inflated to 300 mmHg.  I then made a direct midline incision over the knee and carried this proximally and distally.  I dissected down the joint and carried out a medial parapatellar arthrotomy  finding a very large joint effusion and  severe arthritis throughout her knee.  We removed remnants of ACL, PCL, medial and lateral meniscus and periarticular osteophytes throughout the knee.  With the knee in a flexed position, we used the extramedullary cutting guide for making our tibia cut.   We corrected for varus valgus and neutral slope and made this cut without difficulty, taking 9 mm off the high side.  We then used the intramedullary drill for the femur by making our distal femoral cuts set on a cutting block at 5 degrees externally  rotated for an 8 mm distal femoral cut.  We made this cut without difficulty.  With the knee in full extension,  a 9 mm extension block achieved full extension.  We then went back to the femur and put our femoral sizing guide based off the epicondylar  axis chose a size 3 femur.  We put a 4-in-1 cutting block for size 3 femur, made our anterior and posterior cuts followed by our chamfer cuts.  We then made our femoral box cut.  Attention was then turned back to the tibia for tibia coverage.   For the  tray we chose a size 3 tibial tray and set our rotation off the tibial tubercle.  We did our keel punch and then we drilled for universal baseplate.  Based on I was concerned about potential varus and valgus instability given her weight and potential  laxity of the collateral ligaments and there is a high likelihood she would need a constrained liner.  With the trial 3 femur and the trial 3 tibia, we trialed an 11 millimeter fixed bearing polyethylene insert and I was pleased with the stability of  this.  We then drilled 3 holes and made our patellar cut for a size 29 patellar button.  We then removed all instrumentation from the knee and cleaned as much debris from the knee as we could.  We irrigated the knee with normal saline solution as a  pulsatile lavage.  We mixed our cement and then cemented the real Stryker Triathlon tibial baseplate size 3 followed by the real size 3 left femur.  We cleaned cement debris from the knee and placed the 11 millimeter fixed bearing polyethylene insert and  cemented our patellar button.  We removed some of the debris from the knee and once the cement had hardened, we let the tourniquet down.  Hemostasis was obtained with electrocautery.  We then irrigated the knee again and closed the arthrotomy with  interrupted #1 Ethibond suture, followed by 0 Vicryl in the deep tissue, 2-0 Vicryl subcutaneous tissue and interrupted staples on the skin.  Xeroform well-padded sterile dressing was applied.  She was awakened, extubated, and taken to recovery room in  stable condition.  All final counts were correct.  There were no complications noted.  Of note, Rexene Edison, PA-C, assisted in the entire case and assistance was crucial for facilitating all aspects of this case.  TN/NUANCE  D:10/13/2017 T:10/13/2017 JOB:[id number]

## 2017-10-13 NOTE — Progress Notes (Signed)
10/13/17 1515  PT Visit Information  Last PT Received On 10/13/17  Assistance Needed +1  History of Present Illness Pt is a 67 y/o female s/p elective L TKA. PMH includes obesity, anemia, heart murmur, plantar fasciitis, and PE.   Precautions  Precautions Knee  Precaution Booklet Issued Yes (comment)  Precaution Comments Reviewed knee precautions.   Restrictions  Weight Bearing Restrictions Yes  LLE Weight Bearing WBAT  Home Living  Family/patient expects to be discharged to: Skilled nursing facility  Prior Function  Level of Independence Independent with assistive device(s)  Comments Used cane for ambulation   Communication  Communication No difficulties  Pain Assessment  Pain Assessment 0-10  Pain Score 9  Pain Location L knee   Pain Descriptors / Indicators Aching;Operative site guarding  Pain Intervention(s) Limited activity within patient's tolerance;Monitored during session;Repositioned  Cognition  Arousal/Alertness Awake/alert  Behavior During Therapy WFL for tasks assessed/performed  Overall Cognitive Status Within Functional Limits for tasks assessed  Upper Extremity Assessment  Upper Extremity Assessment Defer to OT evaluation  Lower Extremity Assessment  Lower Extremity Assessment LLE deficits/detail  LLE Deficits / Details Reports some decreased sensation. Deficits consistent with post op pain and weakness.   Cervical / Trunk Assessment  Cervical / Trunk Assessment Normal  Bed Mobility  Overal bed mobility Needs Assistance  Bed Mobility Supine to Sit;Sit to Supine  Supine to sit Mod assist  Sit to supine Min assist  General bed mobility comments Mod A for trunk elevation and LLE assist. Upon sitting, pt with increased pain and episode of nausea/vomiting, so further mobility limited. Min A for LLE assist back to supine.   Balance  Overall balance assessment Needs assistance  Sitting-balance support No upper extremity supported;Feet supported  Sitting  balance-Leahy Scale Fair  General Comments  General comments (skin integrity, edema, etc.) Pt's husband and daughter present during session.   Exercises  Exercises Total Joint  Total Joint Exercises  Ankle Circles/Pumps AROM;Both;20 reps  PT - End of Session  Equipment Utilized During Treatment Gait belt  Activity Tolerance Patient limited by pain;Treatment limited secondary to medical complications (Comment) (nausea/vomiting )  Patient left in bed;with call bell/phone within reach  Nurse Communication Mobility status  CPM Left Knee  CPM Left Knee Off  PT Assessment  PT Recommendation/Assessment Patient needs continued PT services  PT Visit Diagnosis Other abnormalities of gait and mobility (R26.89);Pain  Pain - Right/Left Right  Pain - part of body Knee  PT Problem List Decreased strength;Decreased activity tolerance;Decreased range of motion;Decreased balance;Decreased mobility;Decreased knowledge of use of DME;Decreased knowledge of precautions;Pain;Impaired sensation  PT Plan  PT Frequency (ACUTE ONLY) 7X/week  PT Treatment/Interventions (ACUTE ONLY) DME instruction;Gait training;Therapeutic activities;Functional mobility training;Therapeutic exercise;Balance training;Patient/family education  AM-PAC PT "6 Clicks" Daily Activity Outcome Measure  Difficulty turning over in bed (including adjusting bedclothes, sheets and blankets)? 2  Difficulty moving from lying on back to sitting on the side of the bed?  1  Difficulty sitting down on and standing up from a chair with arms (e.g., wheelchair, bedside commode, etc,.)? 1  Help needed moving to and from a bed to chair (including a wheelchair)? 2  Help needed walking in hospital room? 1  Help needed climbing 3-5 steps with a railing?  1  6 Click Score 8  Mobility G Code  CM  PT Recommendation  Recommendations for Other Services OT consult  Follow Up Recommendations Follow surgeon's recommendation for DC plan and follow-up  therapies;Supervision for mobility/OOB  PT  equipment None recommended by PT  Individuals Consulted  Consulted and Agree with Results and Recommendations Patient;Family member/caregiver  Family Member Consulted husband and daughter   Acute Rehab PT Goals  Patient Stated Goal to decrease pain   PT Goal Formulation With patient  Time For Goal Achievement 10/27/17  Potential to Achieve Goals Good  PT Time Calculation  PT Start Time (ACUTE ONLY) 1508  PT Stop Time (ACUTE ONLY) 1530  PT Time Calculation (min) (ACUTE ONLY) 22 min  PT General Charges  $$ ACUTE PT VISIT 1 Visit  PT Evaluation  $PT Eval Moderate Complexity 1 Mod    Pt is s/p surgery above with deficits below. Pt limited secondary to increased pain and episode of nausea/vomiting, therefore mobility limited to sitting EOB. RN notified. Required min to mod A for mobility. Supine HEP tolerance very poor this session, so will need further review. Pt reports she will be going to SNF at d/c. Will continue to follow acutely to maximize functional mobility independence and safety.   [name], PT, DPT  Acute Rehabilitation Services  Pager: [phone]

## 2017-10-13 NOTE — Transfer of Care (Signed)
Immediate Anesthesia Transfer of Care Note  Patient: [name]  Procedure(s) Performed: LEFT TOTAL KNEE ARTHROPLASTY (Left Knee)  Patient Location: PACU  Anesthesia Type:General  Level of Consciousness: awake, alert , oriented and patient cooperative  Airway & Oxygen Therapy: Patient Spontanous Breathing and Patient connected to face mask oxygen  Post-op Assessment: Report given to RN and Post -op Vital signs reviewed and stable  Post vital signs: Reviewed and stable  Last Vitals:  Vitals Value Taken Time  BP 165/94 10/13/2017 11:23 AM  Temp    Pulse 86 10/13/2017 11:28 AM  Resp 19 10/13/2017 11:28 AM  SpO2 99 % 10/13/2017 11:28 AM  Vitals shown include unvalidated device data.  Last Pain:  Vitals:   10/13/17 0733  TempSrc:   PainSc: 7       Patients Stated Pain Goal: 2 (16/10/96 0454)  Complications: No apparent anesthesia complications

## 2017-10-13 NOTE — Progress Notes (Signed)
After 2 mg IV Dilaudid, 10mg  po OXY IR & po Robaxin, pt states her pain "is thru the roof".  Dr Royce Macadamia updated-new order for up to additional 2 mg IV Dilaudid. Will continue to monitor closely.

## 2017-10-13 NOTE — Progress Notes (Signed)
Orthopedic Tech Progress Note Patient Details:  [name] [birth year] [id number]  CPM Left Knee CPM Left Knee: On Left Knee Flexion (Degrees): 90 Left Knee Extension (Degrees): 0 Additional Comments: Trapeze bar and foot roll  Post Interventions Patient Tolerated: Well Instructions Provided: Care of device, Adjustment of device  [place] Pink 10/13/2017, 12:52 PM

## 2017-10-13 NOTE — Anesthesia Procedure Notes (Signed)
Anesthesia Regional Block: Adductor canal block   Pre-Anesthetic Checklist: ,, timeout performed, Correct Patient, Correct Site, Correct Laterality, Correct Procedure, Correct Position, site marked, Risks and benefits discussed,  Surgical consent,  Pre-op evaluation,  At surgeon's request and post-op pain management  Laterality: Left  Prep: chloraprep       Needles:  Injection technique: Single-shot  Needle Type: Echogenic Stimulator Needle     Needle Length: 9cm  Needle Gauge: 21   Needle insertion depth: 8 cm   Additional Needles:   Procedures:,,,, ultrasound used (permanent image in chart),,,,  Narrative:  Start time: 10/13/2017 8:20 AM End time: 10/13/2017 8:25 AM Injection made incrementally with aspirations every 5 mL.  Performed by: Personally  Anesthesiologist: Josephine Igo, MD  Additional Notes: Timeout performed. Patient sedated. Relevant anatomy ID'd using [place]. Incremental 2-45ml injection of LA with frequent aspiration. Patient tolerated procedure well.

## 2017-10-13 NOTE — H&P (Signed)
TOTAL KNEE ADMISSION H&P  Patient is being admitted for left total knee arthroplasty.  Subjective:  Chief Complaint:left knee pain.  HPI: [name], 67 y.o. female, has a history of pain and functional disability in the left knee due to arthritis and has failed non-surgical conservative treatments for greater than 12 weeks to includeNSAID's and/or analgesics, corticosteriod injections, viscosupplementation injections, flexibility and strengthening excercises, use of assistive devices, weight reduction as appropriate and activity modification.  Onset of symptoms was gradual, starting 5 years ago with gradually worsening course since that time. The patient noted no past surgery on the right knee(s).  Patient currently rates pain in the right knee(s) at 10 out of 10 with activity. Patient has night pain, worsening of pain with activity and weight bearing, pain that interferes with activities of daily living, pain with passive range of motion, crepitus and joint swelling.  Patient has evidence of subchondral sclerosis, periarticular osteophytes and joint space narrowing by imaging studies. There is no active infection.  Patient Active Problem List   Diagnosis Date Noted  . Family history of thyroid disease 08/25/2017  . Acute respiratory infection 08/13/2017  . Chronic pain of left knee 05/20/2017  . Unilateral primary osteoarthritis, left knee 05/20/2017  . Morbid obesity ([place]) 04/30/2017  . Adnexal mass 02/17/2017  . Family history of ovarian cancer 02/17/2017  . IBS (irritable bowel syndrome) 01/21/2017  . Dyspnea 10/08/2016  . Paroxysmal atrial fibrillation ([place]) 06/18/2016  . Type 2 diabetes mellitus without complication, without long-term current use of insulin ([place]) 01/21/2016  . H/O Spinal surgery 12/21/2015  . Barrett's esophagus 12/21/2015  . Hx of adenomatous polyp of colon 03/01/2002   Past Medical History:  Diagnosis Date  . Abnormal thyroid function test   . Allergy    . Anemia    she attributes previously to fibroids, she declines  . Barrett's esophagus   . Blood transfusion without reported diagnosis   . DDD (degenerative disc disease), cervical   . DJD (degenerative joint disease)   . GERD (gastroesophageal reflux disease)   . Heart murmur   . History of hiatal hernia   . Hx of adenomatous polyp of colon 03/01/2002  . OA (osteoarthritis)   . Obese   . Paroxysmal atrial fibrillation ([facility])   . PE (pulmonary embolism) 12/21/2015  . Plantar fasciitis   . PONV (postoperative nausea and vomiting)   . Pre-diabetes     Past Surgical History:  Procedure Laterality Date  . ABDOMINAL HYSTERECTOMY    . Arthroscopic surgery left knee    . BACK SURGERY     2005 ,  2017  . CESAREAN SECTION    . CHOLECYSTECTOMY    . COLONOSCOPY  2003, 2011   3 mm adenoma 2011  . ESOPHAGOGASTRODUODENOSCOPY  multiple since 2003  . IR GENERIC HISTORICAL  12/19/2015   IR ANGIOGRAM SELECTIVE EACH ADDITIONAL VESSEL 12/19/2015 Greggory Keen, MD [facility]  . IR GENERIC HISTORICAL  12/19/2015   IR ANGIOGRAM PULMONARY BILATERAL SELECTIVE 12/19/2015 Greggory Keen, MD [facility]  . IR GENERIC HISTORICAL  12/19/2015   IR INFUSION THROMBOL ARTERIAL INITIAL (MS) 12/19/2015 Greggory Keen, MD [facility]  . IR GENERIC HISTORICAL  12/19/2015   IR US GUIDE VASC ACCESS RIGHT 12/19/2015 Greggory Keen, MD [facility]  . IR GENERIC HISTORICAL  12/19/2015   IR ANGIOGRAM SELECTIVE EACH ADDITIONAL VESSEL 12/19/2015 Greggory Keen, MD [facility]  . IR GENERIC HISTORICAL  12/20/2015   IR THROMB F/U EVAL ART/VEN FINAL DAY (MS)  12/20/2015 Corrie Mckusick, DO [facility]  . IR GENERIC HISTORICAL  12/19/2015   IR INFUSION THROMBOL ARTERIAL INITIAL (MS) 12/19/2015 Greggory Keen, MD [facility]  . PLANTAR FASCIA RELEASE     right  . Right knee replacement    . SPINAL FUSION  10/31/2015   revision at [facility]    . ULNAR TUNNEL RELEASE     right  . VENOUS ABLATION      x 2    Current Facility-Administered Medications  Medication Dose Route Frequency Provider Last Rate Last Dose  . ceFAZolin (ANCEF) 3 g in dextrose 5 % 50 mL IVPB  3 g Intravenous To SSTC Mcarthur Rossetti, MD       Allergies  Allergen Reactions  . Ciprofloxacin Anaphylaxis  . Doxycycline Anaphylaxis  . Strawberry Extract Anaphylaxis  . Tetracyclines & Related Anaphylaxis    Social History   Tobacco Use  . Smoking status: Never Smoker  . Smokeless tobacco: Never Used  Substance Use Topics  . Alcohol use: No    Family History  Problem Relation Age of Onset  . Stroke Maternal Grandmother   . Bladder Cancer Maternal Grandmother 92  . Esophageal cancer Maternal Grandmother 62  . Hyperthyroidism Mother        Radioactive Iodine Treatment  . Hypothyroidism Mother   . Cervical cancer Maternal Aunt 50  . Ovarian cancer Daughter 36  . Colon cancer Neg Hx   . Pancreatic cancer Neg Hx   . Rectal cancer Neg Hx   . Stomach cancer Neg Hx      Review of Systems  Musculoskeletal: Positive for joint pain.  All other systems reviewed and are negative.   Objective:  Physical Exam  Constitutional: She is oriented to person, place, and time. She appears well-developed and well-nourished.  HENT:  Head: Normocephalic and atraumatic.  Eyes: Pupils are equal, round, and reactive to light. EOM are normal.  Neck: Normal range of motion. Neck supple.  Cardiovascular: Normal rate and regular rhythm.  Respiratory: Effort normal and breath sounds normal.  GI: Soft. Bowel sounds are normal.  Musculoskeletal:       Left knee: She exhibits decreased range of motion, effusion and abnormal alignment. Tenderness found. Medial joint line and lateral joint line tenderness noted.  Neurological: She is alert and oriented to person, place, and time.  Skin: Skin is warm and dry.  Psychiatric: She has a normal mood and affect.    Vital signs in last 24 hours:    Labs:   Estimated body  mass index is 48.23 kg/m as calculated from the following:   Height as of 10/01/17: 5\' 2"  (1.575 m).   Weight as of 10/01/17: 263 lb 10.7 oz (119.6 kg).   Imaging Review Plain radiographs demonstrate severe degenerative joint disease of the left knee(s). The overall alignment ismild varus. The bone quality appears to be good for age and reported activity level.   Preoperative templating of the joint replacement has been completed, documented, and submitted to the Operating Room personnel in order to optimize intra-operative equipment management.   Anticipated LOS equal to or greater than 2 midnights due to - Age 36 and older with one or more of the following:  - Obesity  - Expected need for hospital services (PT, OT, Nursing) required for safe  discharge  - Anticipated need for postoperative skilled nursing care or inpatient rehab  - Active co-morbidities: Diabetes OR   - Unanticipated findings during/Post  Surgery: None  - Patient is a high risk of re-admission due to: None     Assessment/Plan:  End stage arthritis, left knee   The patient history, physical examination, clinical judgment of the provider and imaging studies are consistent with end stage degenerative joint disease of the left knee(s) and total knee arthroplasty is deemed medically necessary. The treatment options including medical management, injection therapy arthroscopy and arthroplasty were discussed at length. The risks and benefits of total knee arthroplasty were presented and reviewed. The risks due to aseptic loosening, infection, stiffness, patella tracking problems, thromboembolic complications and other imponderables were discussed. The patient acknowledged the explanation, agreed to proceed with the plan and consent was signed. Patient is being admitted for inpatient treatment for surgery, pain control, PT, OT, prophylactic antibiotics, VTE prophylaxis, progressive ambulation and ADL's and discharge planning.  The patient is planning to be discharged home with home health services

## 2017-10-14 ENCOUNTER — Other Ambulatory Visit: Payer: Self-pay

## 2017-10-14 ENCOUNTER — Encounter (HOSPITAL_COMMUNITY): Payer: Self-pay | Admitting: General Practice

## 2017-10-14 DIAGNOSIS — T829XXA Unspecified complication of cardiac and vascular prosthetic device, implant and graft, initial encounter: Secondary | ICD-10-CM | POA: Insufficient documentation

## 2017-10-14 LAB — BASIC METABOLIC PANEL
Anion gap: 7 (ref 5–15)
BUN: 12 mg/dL (ref 8–23)
CO2: 28 mmol/L (ref 22–32)
Calcium: 8.6 mg/dL — ABNORMAL LOW (ref 8.9–10.3)
Chloride: 103 mmol/L (ref 98–111)
Creatinine, Ser: 0.84 mg/dL (ref 0.44–1.00)
GFR calc Af Amer: >60 mL/min (ref >60)
GFR calc non Af Amer: >60 mL/min (ref >60)
Glucose, Bld: 135 mg/dL — ABNORMAL HIGH (ref 70–99)
Potassium: 3.9 mmol/L (ref 3.5–5.1)
Sodium: 138 mmol/L (ref 135–145)

## 2017-10-14 LAB — CBC
HCT: 34.2 % — ABNORMAL LOW (ref 36.0–46.0)
Hemoglobin: 10.8 g/dL — ABNORMAL LOW (ref 12.0–15.0)
MCH: 27 pg (ref 26.0–34.0)
MCHC: 31.6 g/dL (ref 30.0–36.0)
MCV: 85.5 fL (ref 78.0–100.0)
Platelets: 214 10*3/uL (ref 150–400)
RBC: 4 MIL/uL (ref 3.87–5.11)
RDW: 13.2 % (ref 11.5–15.5)
WBC: 10.8 10*3/uL — ABNORMAL HIGH (ref 4.0–10.5)

## 2017-10-14 NOTE — Progress Notes (Signed)
Subjective: 1 Day Post-Op Procedure(s) (LRB): LEFT TOTAL KNEE ARTHROPLASTY (Left) Patient reports pain as moderate.    Objective: Vital signs in last 24 hours: Temp:  [97 F (36.1 C)-99 F (37.2 C)] 98.9 F (37.2 C) (06/26 0434) Pulse Rate:  [48-83] 63 (06/26 0434) Resp:  [10-20] 18 (06/26 0434) BP: (92-195)/(56-95) 92/56 (06/26 0434) SpO2:  [96 %-100 %] 98 % (06/25 2345)  Intake/Output from previous day: 06/25 0701 - 06/26 0700 In: 2178.4 [P.O.:460; I.V.:1568.4; IV Piggyback:150] Out: 1025 [Urine:900; Blood:125] Intake/Output this shift: No intake/output data recorded.  Recent Labs    10/14/17 0338  HGB 10.8*   Recent Labs    10/14/17 0338  WBC 10.8*  RBC 4.00  HCT 34.2*  PLT 214   Recent Labs    10/14/17 0338  NA 138  K 3.9  CL 103  CO2 28  BUN 12  CREATININE 0.84  GLUCOSE 135*  CALCIUM 8.6*   No results for input(s): LABPT, INR in the last 72 hours.  Sensation intact distally Intact pulses distally Dorsiflexion/Plantar flexion intact Incision: scant drainage Compartment soft  Anticipated LOS equal to or greater than 2 midnights due to - Age 67 and older with one or more of the following:  - Obesity  - Expected need for hospital services (PT, OT, Nursing) required for safe  discharge  - Anticipated need for postoperative skilled nursing care or inpatient rehab  - Active co-morbidities: Diabetes OR   - Unanticipated findings during/Post Surgery: Slow post-op progression: GI, pain control, mobility  - Patient is a high risk of re-admission due to: None   Assessment/Plan: 1 Day Post-Op Procedure(s) (LRB): LEFT TOTAL KNEE ARTHROPLASTY (Left) Up with therapy    Mcarthur Rossetti 10/14/2017, 7:12 AM

## 2017-10-14 NOTE — Clinical Social Work Note (Signed)
Clinical Social Work Assessment  Patient Details  Name: [name] MRN: [id number] Date of Birth: 1950-05-27  Date of referral:  10/14/17               Reason for consult:  Discharge Planning                Permission sought to share information with:  Facility Art therapist granted to share information::  Yes, Verbal Permission Granted  Name::        Agency::  SNF  Relationship::     Contact Information:     Housing/Transportation Living arrangements for the past 2 months:  Single Family Home Source of Information:  Patient Patient Interpreter Needed:  None Criminal Activity/Legal Involvement Pertinent to Current Situation/Hospitalization:  No - Comment as needed Significant Relationships:  Adult Children, Other Family Members, Spouse Lives with:  Spouse Do you feel safe going back to the place where you live?  No Need for family participation in patient care:  No (Coment)  Care giving concerns:  Pt from home with spouse and will need rehab at discharge.  Social Worker assessment / plan:  CSW met with patient and spouse at bedside to discuss disposition. Pt agreeable to rehab and desires IP rehab. CSW indicated that therapy has made recommendations to skilled nursing. CSW explained SNF process, placement and insurance Auth. CSW obtained permission to send to SNF's in [employer] Pt has experience with SNF in the past.  CSW will f/u for disposition.  Employment status:  Disabled (Comment on whether or not currently receiving Disability) Insurance information:  Other (Comment Required)(Medcost) PT Recommendations:  [place] / Referral to community resources:  [place]  Patient/Family's Response to care:  Contractor.  Patient/Family's Understanding of and Emotional Response to Diagnosis, Current Treatment, and Prognosis:  Patient/family has good understanding of impairment. Pt agreeable to SNF  and will return home with family once rehabilitative therapies are complete. Pt is employed as well and hopes to return to work once stable. No issues or concerns identified.  Emotional Assessment Appearance:  Appears stated age Attitude/Demeanor/Rapport:  (Cooperative) Affect (typically observed):  Accepting, Appropriate Orientation:  Oriented to Place, Oriented to Self, Oriented to Situation, Oriented to  Time Alcohol / Substance use:  Not Applicable Psych involvement (Current and /or in the community):  No (Comment)  Discharge Needs  Concerns to be addressed:  Discharge Planning Concerns Readmission within the last 30 days:  No Current discharge risk:  Dependent with Mobility, Physical Impairment Barriers to Discharge:  No Barriers Identified   [name], LCSW 10/14/2017, 3:33 PM

## 2017-10-14 NOTE — Progress Notes (Signed)
Occupational Therapy Evaluation Patient Details Name: [name] MRN: [id number] DOB: 02-25-1951 Today's Date: 10/14/2017    History of Present Illness Pt is a 67 y/o female s/p elective L TKA. PMH includes obesity, anemia, heart murmur, plantar fasciitis, and PE.    Clinical Impression   PTA patient reports she was independent with self care, IADL (working) and mobility (using cane, increased time to don socks/shoes); reports her husband is a brittle diabetic who is unable to assist her.  Currently she demonstrates ability to complete UB self care with supervision, LB dressing with max assist, LB bathing with mod assist, toileting with mod assist and toilet transfers with min assist.  Greatly limited by pain in L knee during session.  Recommend SNF rehab at dc in order to maximize independence and safety prior to dc home; she reports preference to CIR but therapist is unsure she will be able to tolerate intensive program at this time, will continue to evaluate as patient progresses.  Will continue to follow while admitted.     Follow Up Recommendations  SNF;Supervision/Assistance - 24 hour    Equipment Recommendations  Other (comment)(TBD at next venue of care)    Recommendations for Other Services       Precautions / Restrictions Precautions Precautions: Knee Precaution Booklet Issued: No(issued in prior PT session ) Restrictions Weight Bearing Restrictions: Yes LLE Weight Bearing: Weight bearing as tolerated      Mobility Bed Mobility Overal bed mobility: Needs Assistance Bed Mobility: Sit to Supine     Supine to sit: Mod assist     General bed mobility comments: required assist with B LEs into bed  Transfers Overall transfer level: Needs assistance Equipment used: Rolling walker (2 wheeled) Transfers: Sit to/from [employer] Sit to Stand: Min assist Stand pivot transfers: Min assist       General transfer comment: cueing for safety and hand  placement, limited by pain in L knee    Balance Overall balance assessment: Needs assistance Sitting-balance support: No upper extremity supported;Feet supported Sitting balance-Leahy Scale: Fair     Standing balance support: During functional activity;Bilateral upper extremity supported Standing balance-Leahy Scale: Poor Standing balance comment: reliant on B UE support                           ADL either performed or assessed with clinical judgement   ADL Overall ADL's : Needs assistance/impaired Eating/Feeding: Set up;Sitting   Grooming: Set up;Sitting   Upper Body Bathing: Set up;Sitting   Lower Body Bathing: Moderate assistance;Sit to/from stand;Cueing for safety Lower Body Bathing Details (indicate cue type and reason): decreased reach to B LEs and limited ROM to L knee s/p TKA Upper Body Dressing : Set up;Sitting   Lower Body Dressing: Moderate assistance;Cueing for safety;Sit to/from stand Lower Body Dressing Details (indicate cue type and reason): requires assistance for socks/shoes, threading pants; min assiststanding  Toilet Transfer: Minimal assistance;Ambulation;RW;BSC Toilet Transfer Details (indicate cue type and reason): edu on 3:1 commode, cueing for safety and hand placement Toileting- Clothing Manipulation and Hygiene: Moderate assistance;Sit to/from stand       Functional mobility during ADLs: Minimal assistance;Rolling walker General ADL Comments: greatly limited by pain in L knee, cueing for safety due to impulsivity from pain     Vision   Vision Assessment?: No apparent visual deficits     Perception     Praxis      Pertinent Vitals/Pain Pain Assessment: 0-10 Pain  Score: 9  Faces Pain Scale: Hurts little more Pain Location: L knee  Pain Descriptors / Indicators: Aching;Discomfort;Guarding;Grimacing;Operative site guarding;Throbbing Pain Intervention(s): Monitored during session;Limited activity within patient's  tolerance;Repositioned;Patient requesting pain meds-RN notified     Hand Dominance     Extremity/Trunk Assessment Upper Extremity Assessment Upper Extremity Assessment: Generalized weakness   Lower Extremity Assessment Lower Extremity Assessment: LLE deficits/detail LLE Deficits / Details: s/p TKA   Cervical / Trunk Assessment Cervical / Trunk Assessment: Normal   Communication Communication Communication: No difficulties   Cognition Arousal/Alertness: Lethargic;Suspect due to medications Behavior During Therapy: [facility] for tasks assessed/performed Overall Cognitive Status: Within Functional Limits for tasks assessed                                     General Comments       Exercises Exercises: Total Joint Total Joint Exercises Ankle Circles/Pumps: AROM;Both;20 reps;Seated Short Arc Quad: AAROM;Left;10 reps;Seated   Shoulder Instructions      Home Living Family/patient expects to be discharged to:: Skilled nursing facility(short stay rehab)                                        Prior Functioning/Environment Level of Independence: Independent with assistive device(s)        Comments: independent for self care (increased time LB dressing), mobility and transfers using cane; was working full time         OT Problem List: Decreased strength;Decreased range of motion;Decreased activity tolerance;Impaired balance (sitting and/or standing);Decreased safety awareness;Decreased knowledge of use of DME or AE;Decreased knowledge of precautions;Pain      OT Treatment/Interventions: Self-care/ADL training;Therapeutic exercise;Energy conservation;DME and/or AE instruction;Therapeutic activities;Patient/family education;Balance training    OT Goals(Current goals can be found in the care plan section) Acute Rehab OT Goals Patient Stated Goal: to go to rehab OT Goal Formulation: With patient Time For Goal Achievement: 10/28/17 Potential to  Achieve Goals: Good  OT Frequency: Min 2X/week   Barriers to D/C:            Co-evaluation              AM-PAC PT "6 Clicks" Daily Activity     Outcome Measure Help from another person eating meals?: A Little Help from another person taking care of personal grooming?: A Little Help from another person toileting, which includes using toliet, bedpan, or urinal?: A Little Help from another person bathing (including washing, rinsing, drying)?: A Little Help from another person to put on and taking off regular upper body clothing?: A Little Help from another person to put on and taking off regular lower body clothing?: A Lot 6 Click Score: 17   End of Session Equipment Utilized During Treatment: Gait belt;Rolling walker CPM Left Knee CPM Left Knee: Off Nurse Communication: Mobility status;Patient requests pain meds  Activity Tolerance: Patient limited by pain Patient left: in bed;with bed alarm set;with SCD's reapplied  OT Visit Diagnosis: Other abnormalities of gait and mobility (R26.89);Muscle weakness (generalized) (M62.81);Pain Pain - Right/Left: Left Pain - part of body: Knee                Time: 6644-0347 OT Time Calculation (min): 26 min Charges:  OT General Charges $OT Visit: 1 Visit OT Evaluation $OT Eval Moderate Complexity: 1 Mod OT Treatments $Self Care/Home Management : 8-22  mins G-Codes:     Chancy Milroy, OTR/L  Pager [phone]   Chancy Milroy 10/14/2017, 12:46 PM

## 2017-10-14 NOTE — Progress Notes (Signed)
[place] night on call service to report pt.'s temp; waiting for reply as of this writing.

## 2017-10-14 NOTE — [place] FL2 (Signed)
[place] MEDICAID FL2 LEVEL OF CARE SCREENING TOOL     IDENTIFICATION  Patient Name: [name] Birthdate: 1950-07-15 Sex: female Admission Date (Current Location): 10/13/2017  [facility] and [place] Number:  Chiropodist and Address:  [facility] [place], [address] [place], [place] [address]      Provider Number: [id number]  Attending Physician Name and Address:  Kathryne Hitch,*  Relative Name and Phone Number:  Delara Markworth, spouse, [phone]    Current Level of Care: Hospital Recommended Level of Care: Skilled Nursing Facility Prior Approval Number:    Date Approved/Denied:   PASRR Number: [id number]  Discharge Plan: SNF    Current Diagnoses: Patient Active Problem List   Diagnosis Date Noted  . Status post total left knee replacement 10/13/2017  . Family history of thyroid disease 08/25/2017  . Acute respiratory infection 08/13/2017  . Chronic pain of left knee 05/20/2017  . Unilateral primary osteoarthritis, left knee 05/20/2017  . Morbid obesity (HCC) 04/30/2017  . Adnexal mass 02/17/2017  . Family history of ovarian cancer 02/17/2017  . IBS (irritable bowel syndrome) 01/21/2017  . Dyspnea 10/08/2016  . Paroxysmal atrial fibrillation (HCC) 06/18/2016  . Type 2 diabetes mellitus without complication, without long-term current use of insulin (HCC) 01/21/2016  . H/O Spinal surgery 12/21/2015  . Barrett's esophagus 12/21/2015  . Hx of adenomatous polyp of colon 03/01/2002    Orientation RESPIRATION BLADDER Height & Weight     Self, Time, Situation, Place  Normal Continent Weight:   Height:     BEHAVIORAL SYMPTOMS/MOOD NEUROLOGICAL BOWEL NUTRITION STATUS      Continent Diet(See DC Summary)  AMBULATORY STATUS COMMUNICATION OF NEEDS Skin   Extensive Assist Verbally Surgical wounds                       Personal Care Assistance Level of Assistance  Bathing, Feeding, Dressing Bathing Assistance:  Limited assistance Feeding assistance: Limited assistance Dressing Assistance: Maximum assistance     Functional Limitations Info  Sight, Hearing, Speech Sight Info: Adequate Hearing Info: Adequate Speech Info: Adequate    SPECIAL CARE FACTORS FREQUENCY  PT (By licensed PT), OT (By licensed OT)     PT Frequency: 5x week OT Frequency: 2x week            Contractures Contractures Info: Not present    Additional Factors Info  Code Status, Allergies Code Status Info: Full Code Allergies Info: CIPROFLOXACIN, DOXYCYCLINE, STRAWBERRY EXTRACT, TETRACYCLINES & RELATED            Current Medications (10/14/2017):  This is the current hospital active medication list Current Facility-Administered Medications  Medication Dose Route Frequency Provider Last Rate Last Dose  . 0.9 %  sodium chloride infusion   Intravenous Continuous Kathryne Hitch, MD 75 mL/hr at 10/13/17 1743    . acetaminophen (TYLENOL) tablet 325-650 mg  325-650 mg Oral Q6H PRN Kathryne Hitch, MD      . alum & mag hydroxide-simeth (MAALOX/MYLANTA) 200-200-20 MG/5ML suspension 30 mL  30 mL Oral Q4H PRN Kathryne Hitch, MD      . cholecalciferol (VITAMIN D) tablet 1,000 Units  1,000 Units Oral QPM Kathryne Hitch, MD   1,000 Units at 10/13/17 1651  . diphenhydrAMINE (BENADRYL) 12.5 MG/5ML elixir 12.5-25 mg  12.5-25 mg Oral Q4H PRN Kathryne Hitch, MD      . docusate sodium (COLACE) capsule 100 mg  100 mg Oral BID Doneen Poisson  Y, MD      . HYDROmorphone (DILAUDID) injection 0.5-1 mg  0.5-1 mg Intravenous Q4H PRN Kathryne Hitch, MD   1 mg at 10/14/17 0320  . menthol-cetylpyridinium (CEPACOL) lozenge 3 mg  1 lozenge Oral PRN Kathryne Hitch, MD       Or  . phenol (CHLORASEPTIC) mouth spray 1 spray  1 spray Mouth/Throat PRN Kathryne Hitch, MD      . methocarbamol (ROBAXIN) tablet 500 mg  500 mg Oral Q6H PRN Kathryne Hitch, MD   500 mg at  10/14/17 4098   Or  . methocarbamol (ROBAXIN) 500 mg in dextrose 5 % 50 mL IVPB  500 mg Intravenous Q6H PRN Kathryne Hitch, MD      . metoCLOPramide (REGLAN) tablet 5-10 mg  5-10 mg Oral Q8H PRN Kathryne Hitch, MD       Or  . metoCLOPramide (REGLAN) injection 5-10 mg  5-10 mg Intravenous Q8H PRN Kathryne Hitch, MD      . ondansetron [facility]) tablet 4 mg  4 mg Oral Q6H PRN Kathryne Hitch, MD       Or  . ondansetron [facility]) injection 4 mg  4 mg Intravenous Q6H PRN Kathryne Hitch, MD      . oxyCODONE (Oxy IR/ROXICODONE) immediate release tablet 10-15 mg  10-15 mg Oral Q4H PRN Kathryne Hitch, MD   15 mg at 10/14/17 1339  . oxyCODONE (Oxy IR/ROXICODONE) immediate release tablet 5-10 mg  5-10 mg Oral Q4H PRN Kathryne Hitch, MD   10 mg at 10/13/17 1138  . pantoprazole (PROTONIX) EC tablet 40 mg  40 mg Oral Daily Kathryne Hitch, MD   40 mg at 10/14/17 0910  . polyethylene glycol (MIRALAX / GLYCOLAX) packet 17 g  17 g Oral Daily PRN Kathryne Hitch, MD      . rivaroxaban Carlena Hurl) tablet 10 mg  10 mg Oral Q breakfast Kathryne Hitch, MD   10 mg at 10/14/17 1191     Discharge Medications: Please see discharge summary for a list of discharge medications.  Relevant Imaging Results:  Relevant Lab Results:   Additional Information SS#: [id number], LCSW

## 2017-10-14 NOTE — Progress Notes (Signed)
Pt's temp=102.61F, PRN tylenol given, lowered room temp, instructed to do IS, cough and deep breathing exercise. Will recheck temp in 96mins. Will continue to monitor.

## 2017-10-14 NOTE — Progress Notes (Signed)
Physical Therapy Treatment Patient Details Name: [name] MRN: [id number] DOB: 10/15/1950 Today's Date: 10/14/2017    History of Present Illness Pt is a 67 y/o female s/p elective L TKA. PMH includes obesity, anemia, heart murmur, plantar fasciitis, and PE.     PT Comments    Pt making fair progress with functional mobility. She tolerated bed mobility with mod A and transfers with min A and use of RW. She remains limited secondary to increased pain and nausea. Pt reported that she has no family/friends to assist her at home and would benefit from ST rehab at a SNF prior to d/c back home. Pt would continue to benefit from skilled physical therapy services at this time while admitted and after d/c to address the below listed limitations in order to improve overall safety and independence with functional mobility.    Follow Up Recommendations  SNF     Equipment Recommendations  None recommended by PT    Recommendations for Other Services       Precautions / Restrictions Precautions Precautions: Knee Restrictions Weight Bearing Restrictions: Yes LLE Weight Bearing: Weight bearing as tolerated    Mobility  Bed Mobility Overal bed mobility: Needs Assistance Bed Mobility: Supine to Sit     Supine to sit: Mod assist     General bed mobility comments: increased time and effort, HOB significantly elevated, assistance with L LE movement off of bed, use of bed rail to assist with trunk elevation  Transfers Overall transfer level: Needs assistance Equipment used: Rolling walker (2 wheeled) Transfers: Sit to/from [employer] Sit to Stand: Min assist Stand pivot transfers: Min assist       General transfer comment: increased time and effort, good technique, bed in a slightly elevated position, assist for stability and to power into standing from EOB and with pivotal steps to recliner chair  Ambulation/Gait             General Gait Details: deferred  secondary to increased pain, nausea and lethargy   Stairs             Wheelchair Mobility    Modified Rankin (Stroke Patients Only)       Balance Overall balance assessment: Needs assistance Sitting-balance support: Feet supported Sitting balance-Leahy Scale: Fair     Standing balance support: During functional activity;Bilateral upper extremity supported Standing balance-Leahy Scale: Poor                              Cognition Arousal/Alertness: Lethargic;Suspect due to medications Behavior During Therapy: [facility] for tasks assessed/performed Overall Cognitive Status: Within Functional Limits for tasks assessed                                        Exercises Total Joint Exercises Ankle Circles/Pumps: AROM;Both;20 reps;Seated Short Arc Quad: AAROM;Left;10 reps;Seated    General Comments        Pertinent Vitals/Pain Pain Assessment: Faces Faces Pain Scale: Hurts little more Pain Location: L knee  Pain Descriptors / Indicators: Aching;Operative site guarding Pain Intervention(s): Monitored during session;Repositioned    Home Living                      Prior Function            PT Goals (current goals can now be found in the  care plan section) Acute Rehab PT Goals PT Goal Formulation: With patient Time For Goal Achievement: 10/27/17 Potential to Achieve Goals: Good Progress towards PT goals: Progressing toward goals    Frequency    7X/week      PT Plan Current plan remains appropriate    Co-evaluation              AM-PAC PT "6 Clicks" Daily Activity  Outcome Measure  Difficulty turning over in bed (including adjusting bedclothes, sheets and blankets)?: A Lot Difficulty moving from lying on back to sitting on the side of the bed? : Unable Difficulty sitting down on and standing up from a chair with arms (e.g., wheelchair, bedside commode, etc,.)?: Unable Help needed moving to and from a bed to chair  (including a wheelchair)?: A Little Help needed walking in hospital room?: A Lot Help needed climbing 3-5 steps with a railing? : Total 6 Click Score: 10    End of Session Equipment Utilized During Treatment: Gait belt;Oxygen Activity Tolerance: Patient limited by pain;Patient limited by lethargy;Other (comment)(limited secondary to nausea) Patient left: in chair;with call bell/phone within reach Nurse Communication: Mobility status PT Visit Diagnosis: Other abnormalities of gait and mobility (R26.89);Pain Pain - Right/Left: Left Pain - part of body: Knee     Time: 1610-9604 PT Time Calculation (min) (ACUTE ONLY): 18 min  Charges:  $Therapeutic Activity: 8-22 mins                    G Codes:       [place], [place], [place] [phone]    Alessandra Bevels Gaby Harney 10/14/2017, 9:48 AM

## 2017-10-14 NOTE — Plan of Care (Signed)

## 2017-10-14 NOTE — Progress Notes (Signed)
Rechecked Pt's temp after 9mins=100.44F, MD notified. Will continue to monitor, endorsed accordingly.

## 2017-10-14 NOTE — Progress Notes (Signed)
Orthopedic Tech Progress Note Patient Details:  [name] [birth year] [id number]  Patient ID: [name], female   DOB: [birth year], 67 y.o.   MRN: [id number]   [name] 10/14/2017, 3:33 PM Placed pt's lle on cpm @0 - 60 degrees @1530 ; RN notified

## 2017-10-15 NOTE — Progress Notes (Signed)
Physical Therapy Treatment Patient Details Name: [name] MRN: [id number] DOB: 10-29-1950 Today's Date: 10/15/2017    History of Present Illness Pt is a 67 y/o female s/p elective L TKA. PMH includes obesity, anemia, heart murmur, plantar fasciitis, and PE.     PT Comments    Pt making slow progress with functional mobility. Pt was able to ambulate this session within her room with RW and min guard; however, very limited secondary to fatigue and pain. Pt would continue to benefit from skilled physical therapy services at this time while admitted and after d/c to address the below listed limitations in order to improve overall safety and independence with functional mobility.    Follow Up Recommendations  SNF     Equipment Recommendations  None recommended by PT    Recommendations for Other Services       Precautions / Restrictions Precautions Precautions: Knee Restrictions Weight Bearing Restrictions: Yes LLE Weight Bearing: Weight bearing as tolerated    Mobility  Bed Mobility Overal bed mobility: Needs Assistance Bed Mobility: Supine to Sit     Supine to sit: Min guard     General bed mobility comments: increased time and effort, no physical assistance needed  Transfers Overall transfer level: Needs assistance Equipment used: Rolling walker (2 wheeled) Transfers: Sit to/from Stand Sit to Stand: Min guard         General transfer comment: min guard for safety, good technique  Ambulation/Gait Ambulation/Gait assistance: Min guard Gait Distance (Feet): 20 Feet Assistive device: Rolling walker (2 wheeled) Gait Pattern/deviations: Step-to pattern;Decreased step length - right;Decreased stance time - left;Decreased stride length;Decreased weight shift to left Gait velocity: decreased Gait velocity interpretation: <1.31 ft/sec, indicative of household ambulator General Gait Details: pt with very cautious, guarded and slow with RW, min guard for safety;  pt limited secondary to fatigue and pain   Stairs             Wheelchair Mobility    Modified Rankin (Stroke Patients Only)       Balance Overall balance assessment: Needs assistance Sitting-balance support: No upper extremity supported;Feet supported Sitting balance-Leahy Scale: Fair     Standing balance support: During functional activity;Bilateral upper extremity supported Standing balance-Leahy Scale: Poor                              Cognition Arousal/Alertness: Awake/alert Behavior During Therapy: WFL for tasks assessed/performed Overall Cognitive Status: Within Functional Limits for tasks assessed                                        Exercises Total Joint Exercises Ankle Circles/Pumps: AROM;Both;10 reps;Seated Long Arc Quad: AAROM;Left;10 reps;Seated Knee Flexion: AAROM;Left;10 reps;Seated Goniometric ROM: Flexion = 80 degrees, Extension = lacking 10 degrees to neutral    General Comments        Pertinent Vitals/Pain Pain Assessment: Faces Faces Pain Scale: Hurts little more Pain Location: L knee  Pain Descriptors / Indicators: Aching;Discomfort;Guarding;Grimacing;Operative site guarding;Throbbing Pain Intervention(s): Monitored during session;Repositioned    Home Living                      Prior Function            PT Goals (current goals can now be found in the care plan section) Acute Rehab PT Goals PT Goal Formulation:  With patient Time For Goal Achievement: 10/27/17 Potential to Achieve Goals: Good Progress towards PT goals: Progressing toward goals    Frequency    7X/week      PT Plan Current plan remains appropriate    Co-evaluation              AM-PAC PT "6 Clicks" Daily Activity  Outcome Measure  Difficulty turning over in bed (including adjusting bedclothes, sheets and blankets)?: A Lot Difficulty moving from lying on back to sitting on the side of the bed? : A  Lot Difficulty sitting down on and standing up from a chair with arms (e.g., wheelchair, bedside commode, etc,.)?: Unable Help needed moving to and from a bed to chair (including a wheelchair)?: A Little Help needed walking in hospital room?: A Little Help needed climbing 3-5 steps with a railing? : Total 6 Click Score: 12    End of Session Equipment Utilized During Treatment: Gait belt Activity Tolerance: Patient limited by pain;Patient limited by fatigue Patient left: in chair;with call bell/phone within reach Nurse Communication: Mobility status PT Visit Diagnosis: Other abnormalities of gait and mobility (R26.89);Pain Pain - Right/Left: Left Pain - part of body: Knee     Time: 8657-8469 PT Time Calculation (min) (ACUTE ONLY): 14 min  Charges:  $Gait Training: 8-22 mins                    G Codes:       [place], [place], [place] [phone]    Alessandra Bevels Latera Mclin 10/15/2017, 12:39 PM

## 2017-10-15 NOTE — Progress Notes (Signed)
Occupational Therapy Treatment Patient Details Name: TARYNN CONTRERA MRN: [id number] DOB: 09-Nov-1950 Today's Date: 10/15/2017    History of present illness Pt is a 67 y/o female s/p elective L TKA. PMH includes obesity, anemia, heart murmur, plantar fasciitis, and PE.    OT comments  Patient is progressing well with mobility, transfers and self care. She demonstrates ability to complete toilet transfers using RW to 3:1 with min guard assist, toileting standing with min guard assist, grooming standing with min guard assist, LB dressing with minimal assistance using reacher and sock aide, bed mobility with min guard assist.  Patient continues to report she will not have the assistance she needs at home, as her husband is unable to assist and has no one else available.  She would benefit from continued OT services while admitted and after discharge at SNF level in order to maximize independence with ADL, IADL and mobility.    Follow Up Recommendations  SNF;Supervision/Assistance - 24 hour    Equipment Recommendations  Other (comment)(TBD at next venue of care)    Recommendations for Other Services      Precautions / Restrictions Precautions Precautions: Knee Restrictions Weight Bearing Restrictions: Yes LLE Weight Bearing: Weight bearing as tolerated       Mobility Bed Mobility Overal bed mobility: Needs Assistance Bed Mobility: Sit to Supine     Supine to sit: Min guard     General bed mobility comments: increased time and effort, no physical assistance needed  Transfers Overall transfer level: Needs assistance Equipment used: Rolling walker (2 wheeled) Transfers: Sit to/from Stand Sit to Stand: Min guard         General transfer comment: min guard for safety, good recall of hand positioning     Balance Overall balance assessment: Needs assistance Sitting-balance support: No upper extremity supported;Feet supported Sitting balance-Leahy Scale: Good Sitting  balance - Comments: foward reach with no losses of balance   Standing balance support: No upper extremity supported;During functional activity Standing balance-Leahy Scale: Poor Standing balance comment: steady assist from exteral support reaching within base of support                           ADL either performed or assessed with clinical judgement   ADL Overall ADL's : Needs assistance/impaired     Grooming: Min guard;Standing;Wash/dry hands Grooming Details (indicate cue type and reason): at sink, 0-2 hand support cueing for erect posture             Lower Body Dressing: Minimal assistance;Sit to/from stand Lower Body Dressing Details (indicate cue type and reason): assistance for management of L LE, reviewed AE able to doff sock using reacher but required minA with pulling sock aide due to edema in L LE Toilet Transfer: Min guard;BSC;RW;Comfort height toilet;Ambulation Toilet Transfer Details (indicate cue type and reason): 3:1 over toilet, requires steady assist only  Toileting- Clothing Manipulation and Hygiene: Min guard;Sit to/from stand Toileting - Clothing Manipulation Details (indicate cue type and reason): standing with 0-2 hand support, steady assist only      Functional mobility during ADLs: Min guard;Rolling walker(in room mobility) General ADL Comments: improved mobility, transfer and self care; continues to require increased time and slow moving but progressing      Vision       Perception     Praxis      Cognition Arousal/Alertness: Awake/alert Behavior During Therapy: WFL for tasks assessed/performed Overall Cognitive Status: Within Functional Limits for  tasks assessed                                          Exercises Exercises: Total Joint Total Joint Exercises Ankle Circles/Pumps: AROM;Both;10 reps;Seated Long Arc Quad: AAROM;Left;10 reps;Seated Knee Flexion: AAROM;Left;10 reps;Seated Goniometric ROM: Flexion = 80  degrees, Extension = lacking 10 degrees to neutral   Shoulder Instructions       General Comments      Pertinent Vitals/ Pain       Pain Assessment: Faces Faces Pain Scale: Hurts little more Pain Location: L knee  Pain Descriptors / Indicators: Aching;Discomfort;Guarding;Grimacing;Operative site guarding;Throbbing Pain Intervention(s): Monitored during session;Repositioned;Ice applied  Home Living                                          Prior Functioning/Environment              Frequency  Min 2X/week        Progress Toward Goals  OT Goals(current goals can now be found in the care plan section)  Progress towards OT goals: Progressing toward goals  Acute Rehab OT Goals Patient Stated Goal: to go to rehab OT Goal Formulation: With patient Time For Goal Achievement: 10/28/17 Potential to Achieve Goals: Good  Plan Discharge plan remains appropriate;Frequency remains appropriate    Co-evaluation                 AM-PAC PT "6 Clicks" Daily Activity     Outcome Measure   Help from another person eating meals?: None Help from another person taking care of personal grooming?: A Little Help from another person toileting, which includes using toliet, bedpan, or urinal?: A Little Help from another person bathing (including washing, rinsing, drying)?: A Little Help from another person to put on and taking off regular upper body clothing?: None Help from another person to put on and taking off regular lower body clothing?: A Little 6 Click Score: 20    End of Session Equipment Utilized During Treatment: Gait belt;Rolling walker(3:1) CPM Left Knee CPM Left Knee: Off  OT Visit Diagnosis: Other abnormalities of gait and mobility (R26.89);Muscle weakness (generalized) (M62.81);Pain Pain - Right/Left: Left Pain - part of body: Knee   Activity Tolerance Patient tolerated treatment well;No increased pain   Patient Left in bed;with call  bell/phone within reach;with SCD's reapplied   Nurse Communication          Time: 1610-9604 OT Time Calculation (min): 33 min  Charges: OT General Charges $OT Visit: 1 Visit OT Treatments $Self Care/Home Management : 23-37 mins  Chancy Milroy, OTR/L  Pager [phone]   Chancy Milroy 10/15/2017, 3:02 PM

## 2017-10-15 NOTE — Social Work (Signed)
CSW met with patient at bedside to discuss SNF. CSW explained that [employer] is not in network with any SNF and she will have to pay out of pock fees and co-insurance. Pt in agreement as she has no one to care for her at home. Medicare does not cover as she only has part A.  Pt has accepted SNF bed from [facility]. CSW called SNF[facility] and they will initiate Insurance Auth and will f/u with patient on cost.  CSW will continue to follow.  Elissa Hefty, LCSW Clinical Social Worker [phone]

## 2017-10-15 NOTE — Progress Notes (Signed)
Subjective: 2 Days Post-Op Procedure(s) (LRB): LEFT TOTAL KNEE ARTHROPLASTY (Left) Patient reports pain as moderate.  Slow progress with therapy.  Temp spike yesterday due to atelectasis.  Objective: Vital signs in last 24 hours: Temp:  [98.5 F (36.9 C)-102.1 F (38.9 C)] 98.5 F (36.9 C) (06/27 4132) Pulse Rate:  [75-89] 75 (06/27 0652) Resp:  [15-20] 18 (06/27 0652) BP: (122-129)/(50-63) 126/63 (06/27 0652) SpO2:  [92 %-100 %] 92 % (06/27 0652)  Intake/Output from previous day: 06/26 0701 - 06/27 0700 In: 1410.3 [P.O.:600; I.V.:810.3] Out: 200 [Urine:200] Intake/Output this shift: No intake/output data recorded.  Recent Labs    10/14/17 0338  HGB 10.8*   Recent Labs    10/14/17 0338  WBC 10.8*  RBC 4.00  HCT 34.2*  PLT 214   Recent Labs    10/14/17 0338  NA 138  K 3.9  CL 103  CO2 28  BUN 12  CREATININE 0.84  GLUCOSE 135*  CALCIUM 8.6*   No results for input(s): LABPT, INR in the last 72 hours.  Sensation intact distally Intact pulses distally Dorsiflexion/Plantar flexion intact Incision: scant drainage No cellulitis present Compartment soft  Assessment/Plan: 2 Days Post-Op Procedure(s) (LRB): LEFT TOTAL KNEE ARTHROPLASTY (Left) Up with therapy Plan for discharge tomorrow Discharge to SNF    Mcarthur Rossetti 10/15/2017, 7:01 AM

## 2017-10-15 NOTE — Social Work (Signed)
CSW f/u on SNF offers. All SNF's are out of network with plan.  CSW f/u with all the SNF's to see if any would offer and negotiate cost with [employer].  CSW f/u.  Elissa Hefty, LCSW Clinical Social Worker [phone]

## 2017-10-16 MED ORDER — OXYCODONE HCL 5 MG PO TABS: 5.0000 mg | ORAL_TABLET | ORAL | 0 refills | Status: DC | PRN

## 2017-10-16 MED ORDER — RIVAROXABAN 10 MG PO TABS: 10.0000 mg | ORAL_TABLET | Freq: Every day | ORAL | 0 refills | Status: DC

## 2017-10-16 MED ORDER — METHOCARBAMOL 500 MG PO TABS: 500.0000 mg | ORAL_TABLET | Freq: Four times a day (QID) | ORAL | 0 refills | Status: DC | PRN

## 2017-10-16 NOTE — Progress Notes (Signed)
Patient ID: [name], female   DOB: 1950/11/10, 66 y.o.   MRN: [id number] No acute changes.  Left knee stable.  Can be discharged to skilled nursing today.

## 2017-10-16 NOTE — Progress Notes (Signed)
10/16/17 1449  PT Visit Information  Last PT Received On 10/16/17  Assistance Needed +1  History of Present Illness Pt is a 67 y/o female s/p elective L TKA. PMH includes obesity, anemia, heart murmur, plantar fasciitis, and PE.   Subjective Data  Patient Stated Goal to go to rehab  Precautions  Precautions Knee  Precaution Booklet Issued Yes (comment)  Precaution Comments Pt with limited tolerance for ther ex secondary to increased pain.   Restrictions  Weight Bearing Restrictions Yes  LLE Weight Bearing WBAT  Pain Assessment  Pain Assessment 0-10  Pain Score 8  Pain Location L knee   Pain Descriptors / Indicators Aching;Discomfort;Guarding;Grimacing;Operative site guarding;Throbbing  Pain Intervention(s) Limited activity within patient's tolerance;Monitored during session;Repositioned  Cognition  Arousal/Alertness Awake/alert  Behavior During Therapy WFL for tasks assessed/performed  Overall Cognitive Status Within Functional Limits for tasks assessed  Bed Mobility  Overal bed mobility Needs Assistance  Bed Mobility Sit to Supine;Supine to Sit  Supine to sit Min assist  Sit to supine Min assist  General bed mobility comments Min A for scooting hips to EOB to come to sitting. Increased time and effort required. Min A to return to supine for LLE assist.   Transfers  Overall transfer level Needs assistance  Equipment used Rolling walker (2 wheeled)  Transfers Sit to/from Stand  Sit to Stand Min assist  General transfer comment Min A for lift assist and steadying. Pt in increased pain from previous session therefore required increased assist.   Ambulation/Gait  Ambulation/Gait assistance Min guard  Gait Distance (Feet) 15 Feet  Assistive device Rolling walker (2 wheeled)  Gait Pattern/deviations Step-to pattern;Decreased stance time - left;Decreased weight shift to left;Decreased step length - left;Decreased step length - right;Antalgic  General Gait Details Slow, antalgic  gait to bathroom and back to bed. Gait distance limited secondary to pain. Cues for upright posture and LE sequencing.   Gait velocity decreased  Balance  Overall balance assessment Needs assistance  Sitting-balance support No upper extremity supported;Feet supported  Sitting balance-Leahy Scale Good  Standing balance support No upper extremity supported;During functional activity;Bilateral upper extremity supported  Standing balance-Leahy Scale Fair  Standing balance comment Able to maintain static standing for LE dressing. Reviewed technique for sequencing for LE dressing.   General Comments  General comments (skin integrity, edema, etc.) Pt's husband present during session. Exercise deferred secondary to increased pain.   PT - End of Session  Equipment Utilized During Treatment Gait belt  Activity Tolerance Patient limited by pain;Patient limited by fatigue  Patient left in bed;with call bell/phone within reach;with family/visitor present  Nurse Communication Mobility status  CPM Left Knee  CPM Left Knee Off   PT - Assessment/Plan  PT Plan Current plan remains appropriate  PT Visit Diagnosis Other abnormalities of gait and mobility (R26.89);Pain  Pain - Right/Left Left  Pain - part of body Knee  PT Frequency (ACUTE ONLY) 7X/week  Recommendations for Other Services OT consult  Follow Up Recommendations SNF  PT equipment None recommended by PT  AM-PAC PT "6 Clicks" Daily Activity Outcome Measure  Difficulty turning over in bed (including adjusting bedclothes, sheets and blankets)? 2  Difficulty moving from lying on back to sitting on the side of the bed?  1  Difficulty sitting down on and standing up from a chair with arms (e.g., wheelchair, bedside commode, etc,.)? 1  Help needed moving to and from a bed to chair (including a wheelchair)? 3  Help needed walking in hospital  room? 3  Help needed climbing 3-5 steps with a railing?  1  6 Click Score 11  Mobility G Code  CL  PT Goal  Progression  Progress towards PT goals Progressing toward goals  Acute Rehab PT Goals  PT Goal Formulation With patient  Time For Goal Achievement 10/27/17  Potential to Achieve Goals Good  PT Time Calculation  PT Start Time (ACUTE ONLY) 1435  PT Stop Time (ACUTE ONLY) 1447  PT Time Calculation (min) (ACUTE ONLY) 12 min  PT General Charges  $$ ACUTE PT VISIT 1 Visit  PT Treatments  $Therapeutic Activity 8-22 mins   Per PTA, pt would benefit from afternoon session. Gait distance limited secondary to increased pain. Requiring min to min guard A for functional mobility using RW. Reviewed/practiced LE dressing technique with pt and pt's husband. Current recommendations appropriate. Will continue to follow acutely to maximize functional mobility independence and safety.   [name], PT, DPT  Acute Rehabilitation Services  Pager: [phone]

## 2017-10-16 NOTE — Discharge Summary (Signed)
Patient ID: [name] MRN: [id number] DOB/AGE: Nov 22, 1950 67 y.o.  Admit date: 10/13/2017 Discharge date: 10/16/2017  Admission Diagnoses:  Principal Problem:   Unilateral primary osteoarthritis, left knee Active Problems:   Status post total left knee replacement   Discharge Diagnoses:  Same  Past Medical History:  Diagnosis Date  . Abnormal thyroid function test   . Allergy   . Anemia    she attributes previously to fibroids, she declines  . Barrett's esophagus   . Blood transfusion without reported diagnosis   . DDD (degenerative disc disease), cervical   . DJD (degenerative joint disease)   . GERD (gastroesophageal reflux disease)   . Heart murmur   . History of hiatal hernia   . Hx of adenomatous polyp of colon 03/01/2002  . OA (osteoarthritis)   . Obese   . Paroxysmal atrial fibrillation (HCC)   . PE (pulmonary embolism) 12/21/2015  . Plantar fasciitis   . PONV (postoperative nausea and vomiting)    pt. reports that she woke up during surgery  . Pre-diabetes     Surgeries: Procedure(s): LEFT TOTAL KNEE ARTHROPLASTY on 10/13/2017   Consultants:   Discharged Condition: Improved  Hospital Course: [name] is an 67 y.o. female who was admitted 10/13/2017 for operative treatment ofUnilateral primary osteoarthritis, left knee. Patient has severe unremitting pain that affects sleep, daily activities, and work/hobbies. After pre-op clearance the patient was taken to the operating room on 10/13/2017 and underwent  Procedure(s): LEFT TOTAL KNEE ARTHROPLASTY.    Patient was given perioperative antibiotics:  Anti-infectives (From admission, onward)   Start     Dose/Rate Route Frequency Ordered Stop   10/13/17 1430  ceFAZolin (ANCEF) IVPB 2g/100 mL premix     2 g 200 mL/hr over 30 Minutes Intravenous Every 6 hours 10/13/17 1404 10/13/17 2031   10/13/17 0600  ceFAZolin (ANCEF) 3 g in dextrose 5 % 50 mL IVPB     3 g 100 mL/hr over 30 Minutes Intravenous  To Short Stay 10/12/17 1332 10/13/17 0846       Patient was given sequential compression devices, early ambulation, and chemoprophylaxis to prevent DVT.  Patient benefited maximally from hospital stay and there were no complications.    Recent vital signs:  Patient Vitals for the past 24 hrs:  BP Temp Temp src Pulse SpO2 Height Weight  10/16/17 0501 119/62 99.1 F (37.3 C) Oral 77 98 % - -  10/15/17 1959 122/61 (!) 100.6 F (38.1 C) Oral (!) 105 90 % - -  10/15/17 1827 - - - - - 5\' 2"  (1.575 m) 263 lb 10.7 oz (119.6 kg)  10/15/17 1405 114/69 98.3 F (36.8 C) Oral 85 97 % - -     Recent laboratory studies:  Recent Labs    10/14/17 0338  WBC 10.8*  HGB 10.8*  HCT 34.2*  PLT 214  NA 138  K 3.9  CL 103  CO2 28  BUN 12  CREATININE 0.84  GLUCOSE 135*  CALCIUM 8.6*     Discharge Medications:   Allergies as of 10/16/2017      Reactions   Ciprofloxacin Anaphylaxis   Doxycycline Anaphylaxis   Strawberry Extract Anaphylaxis   Tetracyclines & Related Anaphylaxis      Medication List    STOP taking these medications   aspirin 81 MG chewable tablet   ibuprofen 200 MG tablet Commonly known as:  ADVIL,MOTRIN   oxyCODONE-acetaminophen 5-325 MG tablet Commonly known as:  PERCOCET/ROXICET  TAKE these medications   [employer] LUBRICANT DROPS 1 % Gel Generic drug:  Carboxymethylcellulose Sodium Apply 1 drop to both eyes daily as needed for allergic pruritis.   MAGNESIUM PO Take 1 tablet by mouth daily as needed (MAGNESIUM SUPPLEMENTATION.).   methocarbamol 500 MG tablet Commonly known as:  ROBAXIN Take 1 tablet (500 mg total) by mouth every 6 (six) hours as needed for muscle spasms.   oxyCODONE 5 MG immediate release tablet Commonly known as:  Oxy IR/ROXICODONE Take 1-2 tablets (5-10 mg total) by mouth every 4 (four) hours as needed for moderate pain (pain score 4-6).   pantoprazole 40 MG tablet Commonly known as:  PROTONIX Take 1 tablet (40 mg total) by mouth  daily before breakfast.   POTASSIUM PO Take 1 tablet by mouth daily as needed (POTASSIUM SUPPLEMENT.).   rivaroxaban 10 MG Tabs tablet Commonly known as:  XARELTO Take 1 tablet (10 mg total) by mouth daily with breakfast.   Vitamin D-3 1000 units Caps Take 1,000 Units by mouth every evening.            Durable Medical Equipment  (From admission, onward)        Start     Ordered   10/13/17 1405  DME 3 n 1  Once     10/13/17 1404   10/13/17 1405  DME Walker rolling  Once    Question:  Patient needs a walker to treat with the following condition  Answer:  Status post total left knee replacement   10/13/17 1404      Diagnostic Studies: Dg Knee Left Port  Result Date: 10/13/2017 CLINICAL DATA:  67 year old female post left knee replacement. Initial encounter. EXAM: PORTABLE LEFT KNEE - 1-2 VIEW COMPARISON:  None. FINDINGS: Post total left knee replacement which appears in satisfactory position without complication noted. IMPRESSION: Post total left knee replacement. Electronically Signed   By: Lacy Duverney M.D.   On: 10/13/2017 12:30    Disposition: Discharge disposition: 03-Skilled Nursing Facility          Contact information for follow-up providers    Kathryne Hitch, MD Follow up in 2 week(s).   Specialty:  Orthopedic Surgery Contact information: [address] [place] [address] [phone]            Contact information for after-discharge care    Destination    [facility] .   Service:  Skilled Nursing Contact information: [address] [place] [address] [phone]                   Signed: Kathryne Hitch 10/16/2017, 1:41 PM

## 2017-10-16 NOTE — Progress Notes (Signed)
Physical Therapy Treatment Patient Details Name: [name] MRN: [id number] DOB: Apr 07, 1951 Today's Date: 10/16/2017    History of Present Illness Pt is a 67 y/o female s/p elective L TKA. PMH includes obesity, anemia, heart murmur, plantar fasciitis, and PE.     PT Comments    Pt is limited in ability to perform exercises and ambulate secondary to pain and fatigue. She has improved since last session, performing more exercises and increasing her total ambulation distance threefold. She will benefit from continued PT to further progress toward her goals.  Follow Up Recommendations  SNF     Equipment Recommendations  None recommended by PT    Recommendations for Other Services OT consult     Precautions / Restrictions Precautions Precautions: Knee Precaution Booklet Issued: No Restrictions Weight Bearing Restrictions: Yes LLE Weight Bearing: Weight bearing as tolerated    Mobility  Bed Mobility Overal bed mobility: Needs Assistance Bed Mobility: Sit to Supine     Supine to sit: Min guard Sit to supine: Min guard   General bed mobility comments: increased time and effort, no physical assistance needed  Transfers Overall transfer level: Needs assistance Equipment used: Rolling walker (2 wheeled) Transfers: Sit to/from Stand Sit to Stand: Min guard         General transfer comment: Min guard for safety, VC to reach back to bed in stand-sit transfer  Ambulation/Gait Ambulation/Gait assistance: Min guard Gait Distance (Feet): 75 Feet Assistive device: Rolling walker (2 wheeled) Gait Pattern/deviations: Step-to pattern;Decreased step length - right;Decreased stance time - left;Decreased stride length;Decreased weight shift to left Gait velocity: decreased   General Gait Details: pt with very cautious, guarded and slow with RW, min guard for safety; pt limited secondary to fatigue and pain   Stairs             Wheelchair Mobility    Modified  Rankin (Stroke Patients Only)       Balance Overall balance assessment: Needs assistance Sitting-balance support: No upper extremity supported;Feet supported Sitting balance-Leahy Scale: Good     Standing balance support: No upper extremity supported;During functional activity Standing balance-Leahy Scale: Poor                              Cognition Arousal/Alertness: Awake/alert Behavior During Therapy: WFL for tasks assessed/performed Overall Cognitive Status: Within Functional Limits for tasks assessed                                        Exercises Total Joint Exercises Ankle Circles/Pumps: AROM;Both;20 reps;Supine Quad Sets: AROM;10 reps;Supine;Left Short Arc Quad: AROM;Left;10 reps;Supine Heel Slides: AROM;Left;10 reps;Supine Hip ABduction/ADduction: AROM;Left;10 reps;Supine Knee Flexion: AROM;Left;10 reps;Seated Goniometric ROM: 20-79 R knee flexion    General Comments        Pertinent Vitals/Pain Pain Score: 8  Pain Location: L knee  Pain Descriptors / Indicators: Aching;Discomfort;Guarding;Grimacing;Operative site guarding;Throbbing Pain Intervention(s): Monitored during session;Repositioned    Home Living                      Prior Function            PT Goals (current goals can now be found in the care plan section) Acute Rehab PT Goals Patient Stated Goal: to go to rehab PT Goal Formulation: With patient Potential to Achieve Goals: Good Progress towards PT  goals: Progressing toward goals    Frequency    7X/week      PT Plan Current plan remains appropriate    Co-evaluation              AM-PAC PT "6 Clicks" Daily Activity  Outcome Measure  Difficulty turning over in bed (including adjusting bedclothes, sheets and blankets)?: A Lot Difficulty moving from lying on back to sitting on the side of the bed? : A Lot Difficulty sitting down on and standing up from a chair with arms (e.g.,  wheelchair, bedside commode, etc,.)?: Unable Help needed moving to and from a bed to chair (including a wheelchair)?: A Little Help needed walking in hospital room?: A Little Help needed climbing 3-5 steps with a railing? : Total 6 Click Score: 12    End of Session Equipment Utilized During Treatment: Gait belt Activity Tolerance: Patient limited by pain;Patient limited by fatigue Patient left: in bed;with call bell/phone within reach   PT Visit Diagnosis: Other abnormalities of gait and mobility (R26.89);Pain Pain - Right/Left: Left Pain - part of body: Knee     Time: 1610-9604 PT Time Calculation (min) (ACUTE ONLY): 39 min  Charges:  $Gait Training: 8-22 mins $Therapeutic Exercise: 8-22 mins $Therapeutic Activity: 8-22 mins                    G Codes:       Pernell Dupre, SPTA   [facility] 10/16/2017, 2:56 PM

## 2017-10-16 NOTE — Clinical Social Work Placement (Signed)
CLINICAL SOCIAL WORK PLACEMENT  NOTE  Date:  10/16/2017  Patient Details  Name: [name] MRN: [id number] Date of Birth: January 21, 1951  Clinical Social Work is seeking post-discharge placement for this patient at the Skilled  Nursing Facility level of care (*CSW will initial, date and re-position this form in  chart as items are completed):  Yes   Patient/family provided with [facility] Clinical Social Work Department's list of facilities offering this level of care within the geographic area requested by the patient (or if unable, by the patient's family).  Yes   Patient/family informed of their freedom to choose among providers that offer the needed level of care, that participate in Medicare, Medicaid or managed care program needed by the patient, have an available bed and are willing to accept the patient.  Yes   Patient/family informed of [facility]'s ownership interest in [facility] and [facility], as well as of the fact that they are under no obligation to receive care at these facilities.  PASRR submitted to EDS on       PASRR number received on 10/14/17     Existing PASRR number confirmed on       FL2 transmitted to all facilities in geographic area requested by pt/family on 10/14/17     FL2 transmitted to all facilities within larger geographic area on       Patient informed that his/her managed care company has contracts with or will negotiate with certain facilities, including the following:        Yes   Patient/family informed of bed offers received.  Patient chooses bed at [facility]     Physician recommends and patient chooses bed at      Patient to be transferred to [facility] on 10/16/17.  Patient to be transferred to facility by [facility]     Patient family notified on 10/16/17 of transfer.  Name of family member notified:  Pt is alert and oriented     PHYSICIAN       Additional Comment:     _______________________________________________ Maree Krabbe, LCSW 10/16/2017, 9:49 AM

## 2017-10-16 NOTE — Clinical Social Work Note (Addendum)
Clinical Social Worker facilitated patient discharge including contacting patient family and facility to confirm patient discharge plans.  Clinical information faxed to facility and family agreeable with plan.  CSW arranged ambulance transport via [facility] to [facility] .  RN to call [phone] for report prior to discharge.  Clinical Social Worker will sign off for now as social work intervention is no longer needed. Please consult [place] again if new need arises.  Loletha Grayer, MSW [phone]

## 2017-10-16 NOTE — Discharge Summary (Signed)
Patient ID: [name] MRN: [id number] DOB/AGE: 1951/01/02 67 y.o.  Admit date: 10/13/2017 Discharge date: 10/16/2017  Admission Diagnoses:  Principal Problem:   Unilateral primary osteoarthritis, left knee Active Problems:   Status post total left knee replacement   Left ventricular assist device (LVAD) complication   Discharge Diagnoses:  Same  Past Medical History:  Diagnosis Date  . Abnormal thyroid function test   . Allergy   . Anemia    she attributes previously to fibroids, she declines  . Barrett's esophagus   . Blood transfusion without reported diagnosis   . DDD (degenerative disc disease), cervical   . DJD (degenerative joint disease)   . GERD (gastroesophageal reflux disease)   . Heart murmur   . History of hiatal hernia   . Hx of adenomatous polyp of colon 03/01/2002  . OA (osteoarthritis)   . Obese   . Paroxysmal atrial fibrillation (HCC)   . PE (pulmonary embolism) 12/21/2015  . Plantar fasciitis   . PONV (postoperative nausea and vomiting)    pt. reports that she woke up during surgery  . Pre-diabetes     Surgeries: Procedure(s): LEFT TOTAL KNEE ARTHROPLASTY on 10/13/2017   Consultants:   Discharged Condition: Improved  Hospital Course: [name] is an 67 y.o. female who was admitted 10/13/2017 for operative treatment ofUnilateral primary osteoarthritis, left knee. Patient has severe unremitting pain that affects sleep, daily activities, and work/hobbies. After pre-op clearance the patient was taken to the operating room on 10/13/2017 and underwent  Procedure(s): LEFT TOTAL KNEE ARTHROPLASTY.    Patient was given perioperative antibiotics:  Anti-infectives (From admission, onward)   Start     Dose/Rate Route Frequency Ordered Stop   10/13/17 1430  ceFAZolin (ANCEF) IVPB 2g/100 mL premix     2 g 200 mL/hr over 30 Minutes Intravenous Every 6 hours 10/13/17 1404 10/13/17 2031   10/13/17 0600  ceFAZolin (ANCEF) 3 g in dextrose 5 % 50 mL  IVPB     3 g 100 mL/hr over 30 Minutes Intravenous To Short Stay 10/12/17 1332 10/13/17 0846       Patient was given sequential compression devices, early ambulation, and chemoprophylaxis to prevent DVT.  Patient benefited maximally from hospital stay and there were no complications.    Recent vital signs:  Patient Vitals for the past 24 hrs:  BP Temp Temp src Pulse SpO2 Height Weight  10/16/17 0501 119/62 99.1 F (37.3 C) Oral 77 98 % - -  10/15/17 1959 122/61 (!) 100.6 F (38.1 C) Oral (!) 105 90 % - -  10/15/17 1827 - - - - - 5\' 2"  (1.575 m) 263 lb 10.7 oz (119.6 kg)  10/15/17 1405 114/69 98.3 F (36.8 C) Oral 85 97 % - -     Recent laboratory studies:  Recent Labs    10/14/17 0338  WBC 10.8*  HGB 10.8*  HCT 34.2*  PLT 214  NA 138  K 3.9  CL 103  CO2 28  BUN 12  CREATININE 0.84  GLUCOSE 135*  CALCIUM 8.6*     Discharge Medications:   Allergies as of 10/16/2017      Reactions   Ciprofloxacin Anaphylaxis   Doxycycline Anaphylaxis   Strawberry Extract Anaphylaxis   Tetracyclines & Related Anaphylaxis      Medication List    STOP taking these medications   aspirin 81 MG chewable tablet   ibuprofen 200 MG tablet Commonly known as:  ADVIL,MOTRIN   oxyCODONE-acetaminophen 5-325 MG tablet Commonly  known as:  PERCOCET/ROXICET     TAKE these medications   [employer] LUBRICANT DROPS 1 % Gel Generic drug:  Carboxymethylcellulose Sodium Apply 1 drop to both eyes daily as needed for allergic pruritis.   MAGNESIUM PO Take 1 tablet by mouth daily as needed (MAGNESIUM SUPPLEMENTATION.).   methocarbamol 500 MG tablet Commonly known as:  ROBAXIN Take 1 tablet (500 mg total) by mouth every 6 (six) hours as needed for muscle spasms.   oxyCODONE 5 MG immediate release tablet Commonly known as:  Oxy IR/ROXICODONE Take 1-2 tablets (5-10 mg total) by mouth every 4 (four) hours as needed for moderate pain (pain score 4-6).   pantoprazole 40 MG tablet Commonly known as:   PROTONIX Take 1 tablet (40 mg total) by mouth daily before breakfast.   POTASSIUM PO Take 1 tablet by mouth daily as needed (POTASSIUM SUPPLEMENT.).   rivaroxaban 10 MG Tabs tablet Commonly known as:  XARELTO Take 1 tablet (10 mg total) by mouth daily with breakfast.   Vitamin D-3 1000 units Caps Take 1,000 Units by mouth every evening.            Durable Medical Equipment  (From admission, onward)        Start     Ordered   10/13/17 1405  DME 3 n 1  Once     10/13/17 1404   10/13/17 1405  DME Walker rolling  Once    Question:  Patient needs a walker to treat with the following condition  Answer:  Status post total left knee replacement   10/13/17 1404      Diagnostic Studies: Dg Knee Left Port  Result Date: 10/13/2017 CLINICAL DATA:  67 year old female post left knee replacement. Initial encounter. EXAM: PORTABLE LEFT KNEE - 1-2 VIEW COMPARISON:  None. FINDINGS: Post total left knee replacement which appears in satisfactory position without complication noted. IMPRESSION: Post total left knee replacement. Electronically Signed   By: Lacy Duverney M.D.   On: 10/13/2017 12:30    Disposition: Discharge disposition: 03-Skilled Nursing Facility          Contact information for follow-up providers    Kathryne Hitch, MD Follow up in 2 week(s).   Specialty:  Orthopedic Surgery Contact information: [address] [place] [address] [phone]            Contact information for after-discharge care    Destination    [facility] .   Service:  Skilled Nursing Contact information: [address] [place] [address] [phone]                   Signed: Kathryne Hitch 10/16/2017, 6:52 AM

## 2017-10-19 ENCOUNTER — Encounter (INDEPENDENT_AMBULATORY_CARE_PROVIDER_SITE_OTHER): Payer: Self-pay | Admitting: Orthopaedic Surgery

## 2017-10-20 ENCOUNTER — Encounter (INDEPENDENT_AMBULATORY_CARE_PROVIDER_SITE_OTHER): Payer: Self-pay | Admitting: Orthopaedic Surgery

## 2017-10-21 ENCOUNTER — Other Ambulatory Visit (INDEPENDENT_AMBULATORY_CARE_PROVIDER_SITE_OTHER): Payer: Self-pay | Admitting: Orthopaedic Surgery

## 2017-10-21 ENCOUNTER — Encounter (INDEPENDENT_AMBULATORY_CARE_PROVIDER_SITE_OTHER): Payer: Self-pay | Admitting: Orthopaedic Surgery

## 2017-10-28 ENCOUNTER — Encounter (INDEPENDENT_AMBULATORY_CARE_PROVIDER_SITE_OTHER): Payer: Self-pay | Admitting: Orthopaedic Surgery

## 2017-10-28 ENCOUNTER — Ambulatory Visit (INDEPENDENT_AMBULATORY_CARE_PROVIDER_SITE_OTHER): Payer: No Typology Code available for payment source | Admitting: Orthopaedic Surgery

## 2017-10-28 DIAGNOSIS — Z96652 Presence of left artificial knee joint: Secondary | ICD-10-CM

## 2017-10-28 MED ORDER — OXYCODONE HCL 5 MG PO TABS: 5.0000 mg | ORAL_TABLET | ORAL | 0 refills | Status: DC | PRN

## 2017-10-28 MED ORDER — APIXABAN 2.5 MG PO TABS: 2.5000 mg | ORAL_TABLET | Freq: Two times a day (BID) | ORAL | 0 refills | Status: DC

## 2017-10-28 MED ORDER — METHOCARBAMOL 500 MG PO TABS: 500.0000 mg | ORAL_TABLET | Freq: Four times a day (QID) | ORAL | 1 refills | Status: DC | PRN

## 2017-10-28 NOTE — Progress Notes (Signed)
HPI: [name] returns now 2 weeks status post left total knee arthroplasty.  She states she is having terrible pain.  She is now at home she is been discharged from skilled facility.  She feels that the Xarelto she was on was making her feel "awful".  She wants to be on Eliquis.  She does have a history of  a pulmonary embolism in the past.  She is having no shortness of breath fevers chills.  Physical exam: General well-developed well-nourished female in no acute distress. Left knee surgical incisions healing well no signs of gross infection or drainage.  Left calf supple nontender.  Full extension the knee.  Flexion to approximately 85 degrees.  No instability valgus varus stressing.     Impression: Status post Left total knee arthroplasty 10/13/2017  Plan: This point time we will refill her oxycodone and Robaxin.  Discontinue her Xarelto.  We will put her on Eliquis 2.5 mg twice daily for 2 weeks.  Then she will go back on her aspirin regimen she was on prior to surgery.  Scar tissue mobilization over the incision is encouraged.  She will start outpatient physical therapy.  Follow-up with her office in 1 month sooner if there is any questions or concerns.  Reassurance was given that she is making good progress to being 85 degrees of flexion just 2 weeks status post surgery.

## 2017-11-03 ENCOUNTER — Other Ambulatory Visit (INDEPENDENT_AMBULATORY_CARE_PROVIDER_SITE_OTHER): Payer: Self-pay | Admitting: Radiology

## 2017-11-03 ENCOUNTER — Encounter (INDEPENDENT_AMBULATORY_CARE_PROVIDER_SITE_OTHER): Payer: Self-pay | Admitting: Orthopaedic Surgery

## 2017-11-03 DIAGNOSIS — Z96652 Presence of left artificial knee joint: Secondary | ICD-10-CM

## 2017-11-04 ENCOUNTER — Encounter (INDEPENDENT_AMBULATORY_CARE_PROVIDER_SITE_OTHER): Payer: Self-pay | Admitting: Orthopaedic Surgery

## 2017-11-04 ENCOUNTER — Telehealth (INDEPENDENT_AMBULATORY_CARE_PROVIDER_SITE_OTHER): Payer: Self-pay | Admitting: Orthopaedic Surgery

## 2017-11-04 NOTE — Telephone Encounter (Signed)
I sent this one to you I believe from patient message.

## 2017-11-04 NOTE — Telephone Encounter (Signed)
[facility] needs a diagnosis code in order to fill oxycodone. Pharmacy # [phone]

## 2017-11-04 NOTE — Telephone Encounter (Signed)
I called and spoke with the pharmacist - the Rx has been filled and is ready for pickup.  I called and advised the patient.

## 2017-11-10 ENCOUNTER — Ambulatory Visit: Payer: No Typology Code available for payment source | Attending: Orthopaedic Surgery | Admitting: Physical Therapy

## 2017-11-10 ENCOUNTER — Other Ambulatory Visit: Payer: Self-pay

## 2017-11-10 ENCOUNTER — Encounter: Payer: Self-pay | Admitting: Physical Therapy

## 2017-11-10 DIAGNOSIS — R2689 Other abnormalities of gait and mobility: Secondary | ICD-10-CM

## 2017-11-10 DIAGNOSIS — G8929 Other chronic pain: Secondary | ICD-10-CM | POA: Insufficient documentation

## 2017-11-10 DIAGNOSIS — M25562 Pain in left knee: Secondary | ICD-10-CM | POA: Diagnosis present

## 2017-11-10 DIAGNOSIS — M25662 Stiffness of left knee, not elsewhere classified: Secondary | ICD-10-CM | POA: Diagnosis not present

## 2017-11-10 NOTE — Therapy (Addendum)
[place], [place], [address] Phone: [phone]   Fax:  [phone]  Physical Therapy Evaluation  Patient Details  Name: [name] MRN: [id number] Date of Birth: 1951/03/24 Referring Provider: Pete Pelt, PA-C   Encounter Date: 11/10/2017  PT End of Session - 11/10/17 1304    Visit Number  1    Number of Visits  17    Date for PT Re-Evaluation  01/05/18    PT Start Time  1146    PT Stop Time  1232    PT Time Calculation (min)  46 min    Activity Tolerance  Patient tolerated treatment well    Behavior During Therapy  [facility] for tasks assessed/performed       Past Medical History:  Diagnosis Date  . Abnormal thyroid function test   . Allergy   . Anemia    she attributes previously to fibroids, she declines  . Barrett's esophagus   . Blood transfusion without reported diagnosis   . DDD (degenerative disc disease), cervical   . DJD (degenerative joint disease)   . GERD (gastroesophageal reflux disease)   . Heart murmur   . History of hiatal hernia   . Hx of adenomatous polyp of colon 03/01/2002  . OA (osteoarthritis)   . Obese   . Paroxysmal atrial fibrillation (HCC)   . PE (pulmonary embolism) 12/21/2015  . Plantar fasciitis   . PONV (postoperative nausea and vomiting)    pt. reports that she woke up during surgery  . Pre-diabetes     Past Surgical History:  Procedure Laterality Date  . ABDOMINAL HYSTERECTOMY    . Arthroscopic surgery left knee    . BACK SURGERY     2005 ,  2017  . CESAREAN SECTION    . CHOLECYSTECTOMY    . COLONOSCOPY  2003, 2011   3 mm adenoma 2011  . ESOPHAGOGASTRODUODENOSCOPY  multiple since 2003  . IR GENERIC HISTORICAL  12/19/2015   IR ANGIOGRAM SELECTIVE EACH ADDITIONAL VESSEL 12/19/2015 Greggory Keen, MD [facility]  . IR GENERIC HISTORICAL  12/19/2015   IR ANGIOGRAM PULMONARY BILATERAL SELECTIVE 12/19/2015 Greggory Keen, MD [facility]  . IR  GENERIC HISTORICAL  12/19/2015   IR INFUSION THROMBOL ARTERIAL INITIAL (MS) 12/19/2015 Greggory Keen, MD [facility]  . IR GENERIC HISTORICAL  12/19/2015   IR US GUIDE VASC ACCESS RIGHT 12/19/2015 Greggory Keen, MD [facility]  . IR GENERIC HISTORICAL  12/19/2015   IR ANGIOGRAM SELECTIVE EACH ADDITIONAL VESSEL 12/19/2015 Greggory Keen, MD [facility]  . IR GENERIC HISTORICAL  12/20/2015   IR THROMB F/U EVAL ART/VEN FINAL DAY (MS) 12/20/2015 Corrie Mckusick, DO [facility]  . IR GENERIC HISTORICAL  12/19/2015   IR INFUSION THROMBOL ARTERIAL INITIAL (MS) 12/19/2015 Greggory Keen, MD [facility]  . PLANTAR FASCIA RELEASE     right  . Right knee replacement    . SPINAL FUSION  10/31/2015   revision at [place]    . TOTAL KNEE ARTHROPLASTY Left 10/13/2017  . TOTAL KNEE ARTHROPLASTY Left 10/13/2017   Procedure: LEFT TOTAL KNEE ARTHROPLASTY;  Surgeon: Mcarthur Rossetti, MD;  Location: [facility];  Service: Orthopedics;  Laterality: Left;  . ULNAR TUNNEL RELEASE     right  . VENOUS ABLATION     x 2    There were no vitals filed for this visit.   Subjective Assessment - 11/10/17 1157    Subjective  history of thyroid disease 08/25/2017  . Acute respiratory infection 08/13/2017  . Chronic pain of  left knee 05/20/2017  . Unilateral primary osteoarthritis, left knee 05/20/2017  . Morbid obesity ([place]) 04/30/2017  . Adnexal mass 02/17/2017  . Family history of ovarian cancer 02/17/2017  . IBS (irritable bowel syndrome) 01/21/2017  . Dyspnea 10/08/2016  . Paroxysmal atrial fibrillation ([place]) 06/18/2016  . Type 2 diabetes mellitus without complication, without long-term current use of insulin ([place]) 01/21/2016  . H/O Spinal surgery 12/21/2015  . Barrett's esophagus 12/21/2015  . Hx of adenomatous polyp of colon 03/01/2002   [name], SPT 11/10/17 1:54 PM   [facility] [address] [place], [place], [address] Phone: [phone]   Fax:  [phone]  Name: [name] MRN: [id number] Date of Birth: 08/12/1950  history of thyroid disease 08/25/2017  . Acute respiratory infection 08/13/2017  . Chronic pain of  left knee 05/20/2017  . Unilateral primary osteoarthritis, left knee 05/20/2017  . Morbid obesity ([place]) 04/30/2017  . Adnexal mass 02/17/2017  . Family history of ovarian cancer 02/17/2017  . IBS (irritable bowel syndrome) 01/21/2017  . Dyspnea 10/08/2016  . Paroxysmal atrial fibrillation ([place]) 06/18/2016  . Type 2 diabetes mellitus without complication, without long-term current use of insulin ([place]) 01/21/2016  . H/O Spinal surgery 12/21/2015  . Barrett's esophagus 12/21/2015  . Hx of adenomatous polyp of colon 03/01/2002   [name], SPT 11/10/17 1:54 PM   [facility] [address] [place], [place], [address] Phone: [phone]   Fax:  [phone]  Name: [name] MRN: [id number] Date of Birth: 08/12/1950  [place], [place], [address] Phone: [phone]   Fax:  [phone]  Physical Therapy Evaluation  Patient Details  Name: [name] MRN: [id number] Date of Birth: 1951/03/24 Referring Provider: Pete Pelt, PA-C   Encounter Date: 11/10/2017  PT End of Session - 11/10/17 1304    Visit Number  1    Number of Visits  17    Date for PT Re-Evaluation  01/05/18    PT Start Time  1146    PT Stop Time  1232    PT Time Calculation (min)  46 min    Activity Tolerance  Patient tolerated treatment well    Behavior During Therapy  [facility] for tasks assessed/performed       Past Medical History:  Diagnosis Date  . Abnormal thyroid function test   . Allergy   . Anemia    she attributes previously to fibroids, she declines  . Barrett's esophagus   . Blood transfusion without reported diagnosis   . DDD (degenerative disc disease), cervical   . DJD (degenerative joint disease)   . GERD (gastroesophageal reflux disease)   . Heart murmur   . History of hiatal hernia   . Hx of adenomatous polyp of colon 03/01/2002  . OA (osteoarthritis)   . Obese   . Paroxysmal atrial fibrillation (HCC)   . PE (pulmonary embolism) 12/21/2015  . Plantar fasciitis   . PONV (postoperative nausea and vomiting)    pt. reports that she woke up during surgery  . Pre-diabetes     Past Surgical History:  Procedure Laterality Date  . ABDOMINAL HYSTERECTOMY    . Arthroscopic surgery left knee    . BACK SURGERY     2005 ,  2017  . CESAREAN SECTION    . CHOLECYSTECTOMY    . COLONOSCOPY  2003, 2011   3 mm adenoma 2011  . ESOPHAGOGASTRODUODENOSCOPY  multiple since 2003  . IR GENERIC HISTORICAL  12/19/2015   IR ANGIOGRAM SELECTIVE EACH ADDITIONAL VESSEL 12/19/2015 Greggory Keen, MD [facility]  . IR GENERIC HISTORICAL  12/19/2015   IR ANGIOGRAM PULMONARY BILATERAL SELECTIVE 12/19/2015 Greggory Keen, MD [facility]  . IR  GENERIC HISTORICAL  12/19/2015   IR INFUSION THROMBOL ARTERIAL INITIAL (MS) 12/19/2015 Greggory Keen, MD [facility]  . IR GENERIC HISTORICAL  12/19/2015   IR US GUIDE VASC ACCESS RIGHT 12/19/2015 Greggory Keen, MD [facility]  . IR GENERIC HISTORICAL  12/19/2015   IR ANGIOGRAM SELECTIVE EACH ADDITIONAL VESSEL 12/19/2015 Greggory Keen, MD [facility]  . IR GENERIC HISTORICAL  12/20/2015   IR THROMB F/U EVAL ART/VEN FINAL DAY (MS) 12/20/2015 Corrie Mckusick, DO [facility]  . IR GENERIC HISTORICAL  12/19/2015   IR INFUSION THROMBOL ARTERIAL INITIAL (MS) 12/19/2015 Greggory Keen, MD [facility]  . PLANTAR FASCIA RELEASE     right  . Right knee replacement    . SPINAL FUSION  10/31/2015   revision at [place]    . TOTAL KNEE ARTHROPLASTY Left 10/13/2017  . TOTAL KNEE ARTHROPLASTY Left 10/13/2017   Procedure: LEFT TOTAL KNEE ARTHROPLASTY;  Surgeon: Mcarthur Rossetti, MD;  Location: [facility];  Service: Orthopedics;  Laterality: Left;  . ULNAR TUNNEL RELEASE     right  . VENOUS ABLATION     x 2    There were no vitals filed for this visit.   Subjective Assessment - 11/10/17 1157    Subjective  history of thyroid disease 08/25/2017  . Acute respiratory infection 08/13/2017  . Chronic pain of  left knee 05/20/2017  . Unilateral primary osteoarthritis, left knee 05/20/2017  . Morbid obesity ([place]) 04/30/2017  . Adnexal mass 02/17/2017  . Family history of ovarian cancer 02/17/2017  . IBS (irritable bowel syndrome) 01/21/2017  . Dyspnea 10/08/2016  . Paroxysmal atrial fibrillation ([place]) 06/18/2016  . Type 2 diabetes mellitus without complication, without long-term current use of insulin ([place]) 01/21/2016  . H/O Spinal surgery 12/21/2015  . Barrett's esophagus 12/21/2015  . Hx of adenomatous polyp of colon 03/01/2002   [name], SPT 11/10/17 1:54 PM   [facility] [address] [place], [place], [address] Phone: [phone]   Fax:  [phone]  Name: [name] MRN: [id number] Date of Birth: 08/12/1950

## 2017-11-17 ENCOUNTER — Ambulatory Visit: Payer: No Typology Code available for payment source | Admitting: Physical Therapy

## 2017-11-17 ENCOUNTER — Encounter: Payer: Self-pay | Admitting: Physical Therapy

## 2017-11-17 DIAGNOSIS — G8929 Other chronic pain: Secondary | ICD-10-CM

## 2017-11-17 DIAGNOSIS — M25562 Pain in left knee: Secondary | ICD-10-CM

## 2017-11-17 DIAGNOSIS — M25662 Stiffness of left knee, not elsewhere classified: Secondary | ICD-10-CM | POA: Diagnosis not present

## 2017-11-17 DIAGNOSIS — R2689 Other abnormalities of gait and mobility: Secondary | ICD-10-CM

## 2017-11-17 NOTE — Therapy (Signed)
[place], [place], [address] Phone: [phone]   Fax:  [phone]  Physical Therapy Treatment  Patient Details  Name: [name] MRN: [id number] Date of Birth: 1950/08/17 Referring Provider: Pete Pelt, PA-C   Encounter Date: 11/17/2017  PT End of Session - 11/17/17 [place]    Visit Number  2    Number of Visits  17    Date for PT Re-Evaluation  01/05/18    PT Start Time  2094    PT Stop Time  1428    PT Time Calculation (min)  55 min    Activity Tolerance  Patient tolerated treatment well;Patient limited by pain    Behavior During Therapy  [facility] for tasks assessed/performed       Past Medical History:  Diagnosis Date  . Abnormal thyroid function test   . Allergy   . Anemia    she attributes previously to fibroids, she declines  . Barrett's esophagus   . Blood transfusion without reported diagnosis   . DDD (degenerative disc disease), cervical   . DJD (degenerative joint disease)   . GERD (gastroesophageal reflux disease)   . Heart murmur   . History of hiatal hernia   . Hx of adenomatous polyp of colon 03/01/2002  . OA (osteoarthritis)   . Obese   . Paroxysmal atrial fibrillation (HCC)   . PE (pulmonary embolism) 12/21/2015  . Plantar fasciitis   . PONV (postoperative nausea and vomiting)    pt. reports that she woke up during surgery  . Pre-diabetes     Past Surgical History:  Procedure Laterality Date  . ABDOMINAL HYSTERECTOMY    . Arthroscopic surgery left knee    . BACK SURGERY     2005 ,  2017  . CESAREAN SECTION    . CHOLECYSTECTOMY    . COLONOSCOPY  2003, 2011   3 mm adenoma 2011  . ESOPHAGOGASTRODUODENOSCOPY  multiple since 2003  . IR GENERIC HISTORICAL  12/19/2015   IR ANGIOGRAM SELECTIVE EACH ADDITIONAL VESSEL 12/19/2015 Greggory Keen, MD [facility]  . IR GENERIC HISTORICAL  12/19/2015   IR ANGIOGRAM PULMONARY BILATERAL SELECTIVE 12/19/2015 Greggory Keen, MD  [facility]  . IR GENERIC HISTORICAL  12/19/2015   IR INFUSION THROMBOL ARTERIAL INITIAL (MS) 12/19/2015 Greggory Keen, MD [facility]  . IR GENERIC HISTORICAL  12/19/2015   IR US GUIDE VASC ACCESS RIGHT 12/19/2015 Greggory Keen, MD [facility]  . IR GENERIC HISTORICAL  12/19/2015   IR ANGIOGRAM SELECTIVE EACH ADDITIONAL VESSEL 12/19/2015 Greggory Keen, MD [facility]  . IR GENERIC HISTORICAL  12/20/2015   IR THROMB F/U EVAL ART/VEN FINAL DAY (MS) 12/20/2015 Corrie Mckusick, DO [facility]  . IR GENERIC HISTORICAL  12/19/2015   IR INFUSION THROMBOL ARTERIAL INITIAL (MS) 12/19/2015 Greggory Keen, MD [facility]  . PLANTAR FASCIA RELEASE     right  . Right knee replacement    . SPINAL FUSION  10/31/2015   revision at [place]    . TOTAL KNEE ARTHROPLASTY Left 10/13/2017  . TOTAL KNEE ARTHROPLASTY Left 10/13/2017   Procedure: LEFT TOTAL KNEE ARTHROPLASTY;  Surgeon: Mcarthur Rossetti, MD;  Location: [facility];  Service: Orthopedics;  Laterality: Left;  . ULNAR TUNNEL RELEASE     right  . VENOUS ABLATION     x 2    There were no vitals filed for this visit.  Subjective Assessment - 11/17/17 1335  [place], [place], [address] Phone: [phone]   Fax:  [phone]  Physical Therapy Treatment  Patient Details  Name: [name] MRN: [id number] Date of Birth: 1950/08/17 Referring Provider: Pete Pelt, PA-C   Encounter Date: 11/17/2017  PT End of Session - 11/17/17 [place]    Visit Number  2    Number of Visits  17    Date for PT Re-Evaluation  01/05/18    PT Start Time  2094    PT Stop Time  1428    PT Time Calculation (min)  55 min    Activity Tolerance  Patient tolerated treatment well;Patient limited by pain    Behavior During Therapy  [facility] for tasks assessed/performed       Past Medical History:  Diagnosis Date  . Abnormal thyroid function test   . Allergy   . Anemia    she attributes previously to fibroids, she declines  . Barrett's esophagus   . Blood transfusion without reported diagnosis   . DDD (degenerative disc disease), cervical   . DJD (degenerative joint disease)   . GERD (gastroesophageal reflux disease)   . Heart murmur   . History of hiatal hernia   . Hx of adenomatous polyp of colon 03/01/2002  . OA (osteoarthritis)   . Obese   . Paroxysmal atrial fibrillation (HCC)   . PE (pulmonary embolism) 12/21/2015  . Plantar fasciitis   . PONV (postoperative nausea and vomiting)    pt. reports that she woke up during surgery  . Pre-diabetes     Past Surgical History:  Procedure Laterality Date  . ABDOMINAL HYSTERECTOMY    . Arthroscopic surgery left knee    . BACK SURGERY     2005 ,  2017  . CESAREAN SECTION    . CHOLECYSTECTOMY    . COLONOSCOPY  2003, 2011   3 mm adenoma 2011  . ESOPHAGOGASTRODUODENOSCOPY  multiple since 2003  . IR GENERIC HISTORICAL  12/19/2015   IR ANGIOGRAM SELECTIVE EACH ADDITIONAL VESSEL 12/19/2015 Greggory Keen, MD [facility]  . IR GENERIC HISTORICAL  12/19/2015   IR ANGIOGRAM PULMONARY BILATERAL SELECTIVE 12/19/2015 Greggory Keen, MD  [facility]  . IR GENERIC HISTORICAL  12/19/2015   IR INFUSION THROMBOL ARTERIAL INITIAL (MS) 12/19/2015 Greggory Keen, MD [facility]  . IR GENERIC HISTORICAL  12/19/2015   IR US GUIDE VASC ACCESS RIGHT 12/19/2015 Greggory Keen, MD [facility]  . IR GENERIC HISTORICAL  12/19/2015   IR ANGIOGRAM SELECTIVE EACH ADDITIONAL VESSEL 12/19/2015 Greggory Keen, MD [facility]  . IR GENERIC HISTORICAL  12/20/2015   IR THROMB F/U EVAL ART/VEN FINAL DAY (MS) 12/20/2015 Corrie Mckusick, DO [facility]  . IR GENERIC HISTORICAL  12/19/2015   IR INFUSION THROMBOL ARTERIAL INITIAL (MS) 12/19/2015 Greggory Keen, MD [facility]  . PLANTAR FASCIA RELEASE     right  . Right knee replacement    . SPINAL FUSION  10/31/2015   revision at [place]    . TOTAL KNEE ARTHROPLASTY Left 10/13/2017  . TOTAL KNEE ARTHROPLASTY Left 10/13/2017   Procedure: LEFT TOTAL KNEE ARTHROPLASTY;  Surgeon: Mcarthur Rossetti, MD;  Location: [facility];  Service: Orthopedics;  Laterality: Left;  . ULNAR TUNNEL RELEASE     right  . VENOUS ABLATION     x 2    There were no vitals filed for this visit.  Subjective Assessment - 11/17/17 1335  [place], [place], [address] Phone: [phone]   Fax:  [phone]  Physical Therapy Treatment  Patient Details  Name: [name] MRN: [id number] Date of Birth: 1950/08/17 Referring Provider: Pete Pelt, PA-C   Encounter Date: 11/17/2017  PT End of Session - 11/17/17 [place]    Visit Number  2    Number of Visits  17    Date for PT Re-Evaluation  01/05/18    PT Start Time  2094    PT Stop Time  1428    PT Time Calculation (min)  55 min    Activity Tolerance  Patient tolerated treatment well;Patient limited by pain    Behavior During Therapy  [facility] for tasks assessed/performed       Past Medical History:  Diagnosis Date  . Abnormal thyroid function test   . Allergy   . Anemia    she attributes previously to fibroids, she declines  . Barrett's esophagus   . Blood transfusion without reported diagnosis   . DDD (degenerative disc disease), cervical   . DJD (degenerative joint disease)   . GERD (gastroesophageal reflux disease)   . Heart murmur   . History of hiatal hernia   . Hx of adenomatous polyp of colon 03/01/2002  . OA (osteoarthritis)   . Obese   . Paroxysmal atrial fibrillation (HCC)   . PE (pulmonary embolism) 12/21/2015  . Plantar fasciitis   . PONV (postoperative nausea and vomiting)    pt. reports that she woke up during surgery  . Pre-diabetes     Past Surgical History:  Procedure Laterality Date  . ABDOMINAL HYSTERECTOMY    . Arthroscopic surgery left knee    . BACK SURGERY     2005 ,  2017  . CESAREAN SECTION    . CHOLECYSTECTOMY    . COLONOSCOPY  2003, 2011   3 mm adenoma 2011  . ESOPHAGOGASTRODUODENOSCOPY  multiple since 2003  . IR GENERIC HISTORICAL  12/19/2015   IR ANGIOGRAM SELECTIVE EACH ADDITIONAL VESSEL 12/19/2015 Greggory Keen, MD [facility]  . IR GENERIC HISTORICAL  12/19/2015   IR ANGIOGRAM PULMONARY BILATERAL SELECTIVE 12/19/2015 Greggory Keen, MD  [facility]  . IR GENERIC HISTORICAL  12/19/2015   IR INFUSION THROMBOL ARTERIAL INITIAL (MS) 12/19/2015 Greggory Keen, MD [facility]  . IR GENERIC HISTORICAL  12/19/2015   IR US GUIDE VASC ACCESS RIGHT 12/19/2015 Greggory Keen, MD [facility]  . IR GENERIC HISTORICAL  12/19/2015   IR ANGIOGRAM SELECTIVE EACH ADDITIONAL VESSEL 12/19/2015 Greggory Keen, MD [facility]  . IR GENERIC HISTORICAL  12/20/2015   IR THROMB F/U EVAL ART/VEN FINAL DAY (MS) 12/20/2015 Corrie Mckusick, DO [facility]  . IR GENERIC HISTORICAL  12/19/2015   IR INFUSION THROMBOL ARTERIAL INITIAL (MS) 12/19/2015 Greggory Keen, MD [facility]  . PLANTAR FASCIA RELEASE     right  . Right knee replacement    . SPINAL FUSION  10/31/2015   revision at [place]    . TOTAL KNEE ARTHROPLASTY Left 10/13/2017  . TOTAL KNEE ARTHROPLASTY Left 10/13/2017   Procedure: LEFT TOTAL KNEE ARTHROPLASTY;  Surgeon: Mcarthur Rossetti, MD;  Location: [facility];  Service: Orthopedics;  Laterality: Left;  . ULNAR TUNNEL RELEASE     right  . VENOUS ABLATION     x 2    There were no vitals filed for this visit.  Subjective Assessment - 11/17/17 1335  H/O Spinal surgery 12/21/2015  . Barrett's esophagus 12/21/2015  . Hx of adenomatous polyp of colon 03/01/2002    [facility] PTA 11/17/2017, 6:35 PM  [place], [place], [address] Phone: [phone]   Fax:  [phone]  Name: [name] MRN: [id number] Date of Birth: 01-13-1951

## 2017-11-17 NOTE — Patient Instructions (Addendum)
Self-Mobilization: Knee Flexion / Extension (Sitting)    Gently push left leg back with other leg until a stretch is felt. Hold _5__ seconds. Relax. Recross bent legs at ankles. Slowly straighten legs, pushing with lower leg. Hold _5___ seconds. Repeat ___10_ times per set. Do 12__ sets per session. Do _1-3___ sessions per day.  http://orth.exer.us/593   Copyright  VHI. All rights reserved.    Also move patella prior to above all directions to assist ROM

## 2017-11-19 ENCOUNTER — Encounter: Payer: Self-pay | Admitting: Physical Therapy

## 2017-11-25 ENCOUNTER — Ambulatory Visit: Payer: No Typology Code available for payment source | Admitting: Physical Therapy

## 2017-11-25 ENCOUNTER — Encounter (INDEPENDENT_AMBULATORY_CARE_PROVIDER_SITE_OTHER): Payer: Self-pay | Admitting: Physician Assistant

## 2017-11-25 ENCOUNTER — Ambulatory Visit (INDEPENDENT_AMBULATORY_CARE_PROVIDER_SITE_OTHER): Payer: No Typology Code available for payment source | Admitting: Physician Assistant

## 2017-11-25 ENCOUNTER — Inpatient Hospital Stay (HOSPITAL_COMMUNITY): Admission: RE | Admit: 2017-11-25 | Payer: Self-pay | Source: Ambulatory Visit

## 2017-11-25 ENCOUNTER — Ambulatory Visit (INDEPENDENT_AMBULATORY_CARE_PROVIDER_SITE_OTHER): Payer: Self-pay

## 2017-11-25 DIAGNOSIS — M79662 Pain in left lower leg: Secondary | ICD-10-CM

## 2017-11-25 DIAGNOSIS — Z96652 Presence of left artificial knee joint: Secondary | ICD-10-CM

## 2017-11-25 DIAGNOSIS — M7989 Other specified soft tissue disorders: Secondary | ICD-10-CM

## 2017-11-25 MED ORDER — HYDROCODONE-ACETAMINOPHEN 5-325 MG PO TABS: 1.0000 | ORAL_TABLET | Freq: Four times a day (QID) | ORAL | 0 refills | Status: DC | PRN

## 2017-11-25 NOTE — Progress Notes (Signed)
HPI: [name] returns today 43 days status post left total knee arthroplasty.  She is finished up her Eliquis and is now on aspirin 81 mg twice daily.  She states she is having swelling of her left knee and tightness.  States feels like she has rubber band around her knee.  She is has tenderness in her lower leg.  Pain keeps her awake at night.  She is going to formal therapy is bending to about 90 degrees.  She is on OxyContin which she states is upsetting her stomach is requesting to go on Norco.  She is concerned about the swelling and pain in her calf due to the fact that she has a history of PE.  She denies any fevers chills shortness of breath or chest pain.  Physical exam: General well-developed well-nourished female in no acute distress mood affect appropriate. Left knee surgical incisions healing well no signs of infection.  No effusion abnormal warmth erythema left knee.  She has full extension and flexes to near 90 degrees.  She has tenderness proximal calf and popliteal region.  Radiographs: AP and lateral views left knee show a well-seated left total knee arthroplasty.  No acute fractures..  Impression: Status post left total knee arthroplasty 10/13/2017 Left calf pain postop with history of PE  Plan: This point time she will remain on her aspirin 81 mg twice daily.  She is given Norco for pain.  Will obtain a Doppler of her lower leg to rule out DVT.  Have her follow-up with [place] in 1 month sooner if there is any questions or concerns.

## 2017-11-26 ENCOUNTER — Ambulatory Visit (HOSPITAL_COMMUNITY)
Admission: RE | Admit: 2017-11-26 | Discharge: 2017-11-26 | Disposition: A | Discharge status: Home / Self Care | Payer: No Typology Code available for payment source | Source: Ambulatory Visit | Attending: Cardiology | Admitting: Cardiology

## 2017-11-26 DIAGNOSIS — M79662 Pain in left lower leg: Secondary | ICD-10-CM | POA: Diagnosis not present

## 2017-11-26 DIAGNOSIS — M7989 Other specified soft tissue disorders: Secondary | ICD-10-CM | POA: Insufficient documentation

## 2017-11-26 DIAGNOSIS — Z96652 Presence of left artificial knee joint: Secondary | ICD-10-CM | POA: Diagnosis not present

## 2017-12-01 ENCOUNTER — Ambulatory Visit: Payer: No Typology Code available for payment source | Attending: Orthopaedic Surgery | Admitting: Physical Therapy

## 2017-12-01 ENCOUNTER — Encounter: Payer: Self-pay | Admitting: Physical Therapy

## 2017-12-01 DIAGNOSIS — M25562 Pain in left knee: Secondary | ICD-10-CM | POA: Insufficient documentation

## 2017-12-01 DIAGNOSIS — M25662 Stiffness of left knee, not elsewhere classified: Secondary | ICD-10-CM | POA: Insufficient documentation

## 2017-12-01 DIAGNOSIS — G8929 Other chronic pain: Secondary | ICD-10-CM | POA: Diagnosis present

## 2017-12-01 DIAGNOSIS — R2689 Other abnormalities of gait and mobility: Secondary | ICD-10-CM | POA: Diagnosis present

## 2017-12-01 NOTE — Therapy (Signed)
[facility] Outpatient Rehabilitation [facility] [address] [place], [place], [address] Phone: [phone]   Fax:  [phone]  Physical Therapy Treatment  Patient Details  Name: [name] MRN: [id number] Date of Birth: 1950-09-22 Referring Provider: Kirtland Bouchard, PA-C   Encounter Date: 12/01/2017  PT End of Session - 12/01/17 1531    Visit Number  3    Number of Visits  17    Date for PT Re-Evaluation  01/05/18    PT Start Time  1332    PT Stop Time  1425    PT Time Calculation (min)  53 min    Activity Tolerance  Patient tolerated treatment well    Behavior During Therapy  [facility] for tasks assessed/performed       Past Medical History:  Diagnosis Date  . Abnormal thyroid function test   . Allergy   . Anemia    she attributes previously to fibroids, she declines  . Barrett's esophagus   . Blood transfusion without reported diagnosis   . DDD (degenerative disc disease), cervical   . DJD (degenerative joint disease)   . GERD (gastroesophageal reflux disease)   . Heart murmur   . History of hiatal hernia   . Hx of adenomatous polyp of colon 03/01/2002  . OA (osteoarthritis)   . Obese   . Paroxysmal atrial fibrillation (HCC)   . PE (pulmonary embolism) 12/21/2015  . Plantar fasciitis   . PONV (postoperative nausea and vomiting)    pt. reports that she woke up during surgery  . Pre-diabetes     Past Surgical History:  Procedure Laterality Date  . ABDOMINAL HYSTERECTOMY    . Arthroscopic surgery left knee    . BACK SURGERY     2005 ,  2017  . CESAREAN SECTION    . CHOLECYSTECTOMY    . COLONOSCOPY  2003, 2011   3 mm adenoma 2011  . ESOPHAGOGASTRODUODENOSCOPY  multiple since 2003  . IR GENERIC HISTORICAL  12/19/2015   IR ANGIOGRAM SELECTIVE EACH ADDITIONAL VESSEL 12/19/2015 [name], MD [facility]  . IR GENERIC HISTORICAL  12/19/2015   IR ANGIOGRAM PULMONARY BILATERAL SELECTIVE 12/19/2015 [name], MD [facility]  . IR  GENERIC HISTORICAL  12/19/2015   IR INFUSION THROMBOL ARTERIAL INITIAL (MS) 12/19/2015 [name], MD [facility]  . IR GENERIC HISTORICAL  12/19/2015   IR US GUIDE VASC ACCESS RIGHT 12/19/2015 [name], MD [facility]  . IR GENERIC HISTORICAL  12/19/2015   IR ANGIOGRAM SELECTIVE EACH ADDITIONAL VESSEL 12/19/2015 [name], MD [facility]  . IR GENERIC HISTORICAL  12/20/2015   IR THROMB F/U EVAL ART/VEN FINAL DAY (MS) 12/20/2015 Gilmer Mor, DO [facility]  . IR GENERIC HISTORICAL  12/19/2015   IR INFUSION THROMBOL ARTERIAL INITIAL (MS) 12/19/2015 [name], MD [facility]  . PLANTAR FASCIA RELEASE     right  . Right knee replacement    . SPINAL FUSION  10/31/2015   revision at [facility]   . TONSILLECTOMY    . TOTAL KNEE ARTHROPLASTY Left 10/13/2017  . TOTAL KNEE ARTHROPLASTY Left 10/13/2017   Procedure: LEFT TOTAL KNEE ARTHROPLASTY;  Surgeon: Kathryne Hitch, MD;  [facility];  Service: Orthopedics;  Laterality: Left;  . ULNAR TUNNEL RELEASE     right  . VENOUS ABLATION     x 2    There were no vitals filed for this visit.  Subjective Assessment - 12/01/17 1332    Subjective  I  was concerned about a DVT and I had to cancel I had an [place] and no DVT,  Jiust swelling.    I am sleeping a little better.  I wore a hole in the sheet with exercise.    Currently in Pain?  Yes    Pain Score  5     Pain Location  Knee    Pain Orientation  Left    Pain Descriptors / Indicators  --   tight band   Pain Type  Surgical pain    Pain Radiating Towards  into left hip a little    Pain Frequency  Intermittent    Aggravating Factors   bending over ,  walking longer,  standing longer( back limits too)    Pain Relieving Factors  meds,  ice  moving around    Multiple Pain Sites  --   back up to 8/10 ,  long standing                      [facility] Adult PT Treatment/Exercise - 12/01/17 0001      Ambulation/Gait   Ambulation/Gait  --   yes,    Assistive device  Straight cane    Gait Comments  walks around cane inside house without cane.    Feels unsteady on uneven ground. In clinic able to walk 2 minutes.       Knee/Hip Exercises: Stretches   Other Knee/Hip Stretches  standing step stretch 10 x 10 seconds      Knee/Hip Exercises: Aerobic   Nustep  LE only 5 minuted      Knee/Hip Exercises: Standing   Terminal Knee Extension Limitations  10 X ball at wall  CGA for safety    Other Standing Knee Exercises  wall slides facing wall    5 X each  CGA cues     Cryotherapy   Number Minutes Cryotherapy  10 Minutes    Cryotherapy Location  Hip;Knee    Type of Cryotherapy  --   cold pack     Manual Therapy   Manual therapy comments  soft tissue work  TFL tender and lateral thigh,  tennis ball issued patient tearful and cries with light  soft tissue  at TFL             PT Education - 12/01/17 1531    Education Details  How to do soft tissue work with tennis ball.  Gait,  Stretch    Methods  Explanation;Demonstration    Comprehension  Verbalized understanding;Returned demonstration       PT Short Term Goals - 12/01/17 1535      PT SHORT TERM GOAL #1   Title  pt will be I with HEP    Baseline  independent with exercise issued so far    Time  4    Period  Weeks    Status  On-going      PT SHORT TERM GOAL #2   Title  Pt will demonstrate decreased edema in LLE to be equal to RLE (46cm measured 10 deg distal to tibial tuberosity)    Baseline  edema varies,  recent US study    Time  4    Period  Weeks    Status  On-going        PT Long Term Goals - 11/10/17 1328      PT LONG TERM GOAL #1   Title  Pt will demonstrate increased L knee flexion AROM to >/=  105 degrees to improve functional mobility and improve gait mechanics    Baseline  L knee flexion 80 deg    Time  8    Period  Weeks    Status  New    Target Date  01/05/18      PT LONG TERM GOAL #2   Title  Pt will achieve 0 degrees of extension to improve  functional mobility and improve gait mechanics    Baseline  lacking 17 degrees of L knee extension    Time  8    Period  Weeks    Status  New    Target Date  01/05/18      PT LONG TERM GOAL #3   Title  Pt will be able to stand and walk >/= 30 min with LRAD and </= 2/10 pain in order to be able to perform work related duties     Baseline  pt unable to stand longer than 2-3 min and walk longer than 8 min    Time  8    Period  Weeks    Status  New    Target Date  01/05/18      PT LONG TERM GOAL #4   Title  Pt will decrease FOTO score to </= 56% limited to improve function an quality of life    Baseline  70% limited    Time  8    Period  Weeks    Status  New    Target Date  01/05/18      PT LONG TERM GOAL #5   Title  Pt will be I with HEP by final visit to ensure progress beyond discharge from physical therapy    Baseline  pt performing exercises from [facility] at home currently    Time  8    Period  Weeks    Status  New    Target Date  01/05/18            Plan - 12/01/17 1532    Clinical Impression Statement  AAROM 92 degrees. Able to tolerate gait 2 minutes.  DVT has been ruled out with a recent US study.  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  Danielle Dess had lessesned post ice.     PT Next Visit Plan  HEP review/update,add walking program, standing step stretch , test hip strength, knee ROM, joint and scar mobilizations,( gentle soft tissue) modalities prn    PT Home Exercise Plan  heel slides, SAQs, seated hamstring stretch    Consulted and Agree with Plan of Care  Patient       Patient will benefit from skilled therapeutic intervention in order to improve the following deficits and impairments:     Visit Diagnosis: Stiffness of left knee, not elsewhere classified  Other abnormalities of gait and mobility  Chronic pain of left knee     Problem List Patient Active Problem List   Diagnosis Date Noted  . Status post total  left knee replacement 10/13/2017  . Family history of thyroid disease 08/25/2017  . Acute respiratory infection 08/13/2017  . Chronic pain of left knee 05/20/2017  . Unilateral primary osteoarthritis, left knee 05/20/2017  . Morbid obesity (HCC) 04/30/2017  . Adnexal mass 02/17/2017  . Family history of ovarian cancer 02/17/2017  . IBS (irritable bowel syndrome) 01/21/2017  . Dyspnea 10/08/2016  . Paroxysmal atrial fibrillation (HCC) 06/18/2016  . Type 2 diabetes mellitus without complication, without long-term  current use of insulin (HCC) 01/21/2016  . H/O Spinal surgery 12/21/2015  . Barrett's esophagus 12/21/2015  . Hx of adenomatous polyp of colon 03/01/2002    [name] PTA 12/01/2017, 3:38 PM  [facility] [address] [place], [place], [address] Phone: [phone]   Fax:  [phone]  Name: [name] MRN: [id number] Date of Birth: [birth year]

## 2017-12-01 NOTE — Patient Instructions (Signed)
USE TENNIS BALL FOR SOFT TISSUE WORK

## 2017-12-03 ENCOUNTER — Ambulatory Visit: Payer: No Typology Code available for payment source | Admitting: Physical Therapy

## 2017-12-03 ENCOUNTER — Encounter: Payer: Self-pay | Admitting: Physical Therapy

## 2017-12-03 DIAGNOSIS — M25562 Pain in left knee: Secondary | ICD-10-CM

## 2017-12-03 DIAGNOSIS — M25662 Stiffness of left knee, not elsewhere classified: Secondary | ICD-10-CM

## 2017-12-03 DIAGNOSIS — R2689 Other abnormalities of gait and mobility: Secondary | ICD-10-CM

## 2017-12-03 DIAGNOSIS — G8929 Other chronic pain: Secondary | ICD-10-CM

## 2017-12-03 NOTE — Therapy (Signed)
[facility] Outpatient Rehabilitation [facility] [address] [place], [place], [address] Phone: [phone]   Fax:  [phone]  Physical Therapy Treatment  Patient Details  Name: [name] MRN: [id number] Date of Birth: [birth year] Referring Provider: Kirtland Bouchard, PA-C   Encounter Date: 12/03/2017  PT End of Session - 12/03/17 1322    Visit Number  4    Number of Visits  17    Date for PT Re-Evaluation  01/05/18    PT Start Time  1200    PT Stop Time  1240   short session due to patient late.  She was on Ice today   PT Time Calculation (min)  40 min    Activity Tolerance  Patient tolerated treatment well    Behavior During Therapy  [facility] for tasks assessed/performed       Past Medical History:  Diagnosis Date  . Abnormal thyroid function test   . Allergy   . Anemia    she attributes previously to fibroids, she declines  . Barrett's esophagus   . Blood transfusion without reported diagnosis   . DDD (degenerative disc disease), cervical   . DJD (degenerative joint disease)   . GERD (gastroesophageal reflux disease)   . Heart murmur   . History of hiatal hernia   . Hx of adenomatous polyp of colon 03/01/2002  . OA (osteoarthritis)   . Obese   . Paroxysmal atrial fibrillation (HCC)   . PE (pulmonary embolism) 12/21/2015  . Plantar fasciitis   . PONV (postoperative nausea and vomiting)    pt. reports that she woke up during surgery  . Pre-diabetes     Past Surgical History:  Procedure Laterality Date  . ABDOMINAL HYSTERECTOMY    . Arthroscopic surgery left knee    . BACK SURGERY     2005 ,  2017  . CESAREAN SECTION    . CHOLECYSTECTOMY    . COLONOSCOPY  2003, 2011   3 mm adenoma 2011  . ESOPHAGOGASTRODUODENOSCOPY  multiple since 2003  . IR GENERIC HISTORICAL  12/19/2015   IR ANGIOGRAM SELECTIVE EACH ADDITIONAL VESSEL 12/19/2015 [name], MD [facility]  . IR GENERIC HISTORICAL  12/19/2015   IR ANGIOGRAM PULMONARY BILATERAL  SELECTIVE 12/19/2015 [name], MD [facility]  . IR GENERIC HISTORICAL  12/19/2015   IR INFUSION THROMBOL ARTERIAL INITIAL (MS) 12/19/2015 [name], MD [facility]  . IR GENERIC HISTORICAL  12/19/2015   IR US GUIDE VASC ACCESS RIGHT 12/19/2015 [name], MD [facility]  . IR GENERIC HISTORICAL  12/19/2015   IR ANGIOGRAM SELECTIVE EACH ADDITIONAL VESSEL 12/19/2015 [name], MD [facility]  . IR GENERIC HISTORICAL  12/20/2015   IR THROMB F/U EVAL ART/VEN FINAL DAY (MS) 12/20/2015 Gilmer Mor, DO [facility]  . IR GENERIC HISTORICAL  12/19/2015   IR INFUSION THROMBOL ARTERIAL INITIAL (MS) 12/19/2015 [name], MD [facility]  . PLANTAR FASCIA RELEASE     right  . Right knee replacement    . SPINAL FUSION  10/31/2015   revision at [facility]   . TONSILLECTOMY    . TOTAL KNEE ARTHROPLASTY Left 10/13/2017  . TOTAL KNEE ARTHROPLASTY Left 10/13/2017   Procedure: LEFT TOTAL KNEE ARTHROPLASTY;  Surgeon: Kathryne Hitch, MD;  Location: [facility] OR;  Service: Orthopedics;  Laterality: Left;  . ULNAR TUNNEL RELEASE     right  . VENOUS ABLATION     x 2    There were no vitals filed for this  visit.  Subjective Assessment - 12/03/17 1203    Subjective  I have had a bad day.  I did not sleep last night.  My stomach is not right.  Pain is 7/10.  My hip will have to work itself out on its own.  I worked it and had the same reaction.    Currently in Pain?  Yes    Pain Score  7     Pain Location  Knee    Pain Orientation  Left    Pain Descriptors / Indicators  --   stinging   Pain Type  Surgical pain                       [facility] Adult PT Treatment/Exercise - 12/03/17 0001      High Level Balance   High Level Balance Comments  forward, reverse 3 x 6 feet , one set no hands      Knee/Hip Exercises: Aerobic   Nustep  6 minutes LE only  Level 4      Knee/Hip Exercises: Standing   Heel Raises  10 reps    Forward Step Up  10 reps;Step Height: 4"    CGA for safety   Functional Squat Limitations  mini squat with small steps right and legt 6 feet X 2    Other Standing Knee Exercises  mini squat with 6 feet X e side steo holding counter.      Knee/Hip Exercises: Seated   Sit to Sand  10 reps      Knee/Hip Exercises: Supine   Quad Sets  10 reps    Heel Slides  10 reps;AAROM    Patellar Mobs  yes  inferior glides        Cryotherapy   Number Minutes Cryotherapy  10 Minutes    Cryotherapy Location  Knee    Type of Cryotherapy  --   cold pack     Manual Therapy   Manual therapy comments  soft tissue work hamstrings  tissue softened    Joint Mobilization  P/A gildes to assist flexion    Passive ROM  flexion and extension,  gentle tolerated               PT Short Term Goals - 12/01/17 1535      PT SHORT TERM GOAL #1   Title  pt will be I with HEP    Baseline  independent with exercise issued so far    Time  4    Period  Weeks    Status  On-going      PT SHORT TERM GOAL #2   Title  Pt will demonstrate decreased edema in LLE to be equal to RLE (46cm measured 10 deg distal to tibial tuberosity)    Baseline  edema varies,  recent US study    Time  4    Period  Weeks    Status  On-going        PT Long Term Goals - 11/10/17 1328      PT LONG TERM GOAL #1   Title  Pt will demonstrate increased L knee flexion AROM to >/= 105 degrees to improve functional mobility and improve gait mechanics    Baseline  L knee flexion 80 deg    Time  8    Period  Weeks    Status  New    Target Date  01/05/18      PT LONG TERM GOAL #2  Title  Pt will achieve 0 degrees of extension to improve functional mobility and improve gait mechanics    Baseline  lacking 17 degrees of L knee extension    Time  8    Period  Weeks    Status  New    Target Date  01/05/18      PT LONG TERM GOAL #3   Title  Pt will be able to stand and walk >/= 30 min with LRAD and </= 2/10 pain in order to be able to perform work related duties      Baseline  pt unable to stand longer than 2-3 min and walk longer than 8 min    Time  8    Period  Weeks    Status  New    Target Date  01/05/18      PT LONG TERM GOAL #4   Title  Pt will decrease FOTO score to </= 56% limited to improve function an quality of life    Baseline  70% limited    Time  8    Period  Weeks    Status  New    Target Date  01/05/18      PT LONG TERM GOAL #5   Title  Pt will be I with HEP by final visit to ensure progress beyond discharge from physical therapy    Baseline  pt performing exercises from [facility] at home currently    Time  8    Period  Weeks    Status  New    Target Date  01/05/18            Plan - 12/03/17 1323    Clinical Impression Statement  90 AROM  AA93 degrees.  Patient tolerated exercises today with brief increased pain.  pain 6/10 at end of session    PT Next Visit Plan  Try steps.  quad strength terminal knee ROM/Extension,  balance    PT Home Exercise Plan  heel slides, SAQs, seated hamstring stretch    Consulted and Agree with Plan of Care  Patient       Patient will benefit from skilled therapeutic intervention in order to improve the following deficits and impairments:     Visit Diagnosis: Stiffness of left knee, not elsewhere classified  Other abnormalities of gait and mobility  Chronic pain of left knee     Problem List Patient Active Problem List   Diagnosis Date Noted  . Status post total left knee replacement 10/13/2017  . Family history of thyroid disease 08/25/2017  . Acute respiratory infection 08/13/2017  . Chronic pain of left knee 05/20/2017  . Unilateral primary osteoarthritis, left knee 05/20/2017  . Morbid obesity (HCC) 04/30/2017  . Adnexal mass 02/17/2017  . Family history of ovarian cancer 02/17/2017  . IBS (irritable bowel syndrome) 01/21/2017  . Dyspnea 10/08/2016  . Paroxysmal atrial fibrillation (HCC) 06/18/2016  . Type 2 diabetes mellitus without complication, without  long-term current use of insulin (HCC) 01/21/2016  . H/O Spinal surgery 12/21/2015  . Barrett's esophagus 12/21/2015  . Hx of adenomatous polyp of colon 03/01/2002    HARRIS,KAREN PTA 12/03/2017, 1:27 PM  [facility] [address] [place], [place], [address] Phone: [phone]   Fax:  [phone]  Name: MARYSSA SOLLAZZO MRN: [id number] Date of Birth: [birth year]

## 2017-12-08 ENCOUNTER — Ambulatory Visit: Payer: No Typology Code available for payment source | Admitting: Physical Therapy

## 2017-12-10 ENCOUNTER — Encounter: Payer: Self-pay | Admitting: Physical Therapy

## 2017-12-10 ENCOUNTER — Ambulatory Visit: Payer: No Typology Code available for payment source | Admitting: Physical Therapy

## 2017-12-10 DIAGNOSIS — M25662 Stiffness of left knee, not elsewhere classified: Secondary | ICD-10-CM | POA: Diagnosis not present

## 2017-12-10 DIAGNOSIS — G8929 Other chronic pain: Secondary | ICD-10-CM

## 2017-12-10 DIAGNOSIS — R2689 Other abnormalities of gait and mobility: Secondary | ICD-10-CM

## 2017-12-10 DIAGNOSIS — M25562 Pain in left knee: Secondary | ICD-10-CM

## 2017-12-10 NOTE — Therapy (Signed)
[place], [place], [address] Phone: [phone]   Fax:  [phone]  Physical Therapy Treatment  Patient Details  Name: [name] MRN: [id number] Date of Birth: 1950/08/03 Referring Provider: Pete Pelt, PA-C   Encounter Date: 12/10/2017  PT End of Session - 12/10/17 1230    Visit Number  5    Number of Visits  17    Date for PT Re-Evaluation  01/05/18    PT Start Time  1200   pt arrived 15 min late   PT Stop Time  0962   8366   PT Time Calculation (min)  38 min    Activity Tolerance  Patient tolerated treatment well;Patient limited by pain    Behavior During Therapy  [facility] for tasks assessed/performed       Past Medical History:  Diagnosis Date  . Abnormal thyroid function test   . Allergy   . Anemia    she attributes previously to fibroids, she declines  . Barrett's esophagus   . Blood transfusion without reported diagnosis   . DDD (degenerative disc disease), cervical   . DJD (degenerative joint disease)   . GERD (gastroesophageal reflux disease)   . Heart murmur   . History of hiatal hernia   . Hx of adenomatous polyp of colon 03/01/2002  . OA (osteoarthritis)   . Obese   . Paroxysmal atrial fibrillation (HCC)   . PE (pulmonary embolism) 12/21/2015  . Plantar fasciitis   . PONV (postoperative nausea and vomiting)    pt. reports that she woke up during surgery  . Pre-diabetes     Past Surgical History:  Procedure Laterality Date  . ABDOMINAL HYSTERECTOMY    . Arthroscopic surgery left knee    . BACK SURGERY     2005 ,  2017  . CESAREAN SECTION    . CHOLECYSTECTOMY    . COLONOSCOPY  2003, 2011   3 mm adenoma 2011  . ESOPHAGOGASTRODUODENOSCOPY  multiple since 2003  . IR GENERIC HISTORICAL  12/19/2015   IR ANGIOGRAM SELECTIVE EACH ADDITIONAL VESSEL 12/19/2015 Greggory Keen, MD [facility]  . IR GENERIC HISTORICAL  12/19/2015   IR ANGIOGRAM PULMONARY BILATERAL  SELECTIVE 12/19/2015 Greggory Keen, MD [facility]  . IR GENERIC HISTORICAL  12/19/2015   IR INFUSION THROMBOL ARTERIAL INITIAL (MS) 12/19/2015 Greggory Keen, MD [facility]  . IR GENERIC HISTORICAL  12/19/2015   IR US GUIDE VASC ACCESS RIGHT 12/19/2015 Greggory Keen, MD [facility]  . IR GENERIC HISTORICAL  12/19/2015   IR ANGIOGRAM SELECTIVE EACH ADDITIONAL VESSEL 12/19/2015 Greggory Keen, MD [facility]  . IR GENERIC HISTORICAL  12/20/2015   IR THROMB F/U EVAL ART/VEN FINAL DAY (MS) 12/20/2015 Corrie Mckusick, DO [facility]  . IR GENERIC HISTORICAL  12/19/2015   IR INFUSION THROMBOL ARTERIAL INITIAL (MS) 12/19/2015 Greggory Keen, MD [facility]  . PLANTAR FASCIA RELEASE     right  . Right knee replacement    . SPINAL FUSION  10/31/2015   revision at [facility]    . TOTAL KNEE ARTHROPLASTY Left 10/13/2017  . TOTAL KNEE ARTHROPLASTY Left 10/13/2017   Procedure: LEFT TOTAL KNEE ARTHROPLASTY;  Surgeon: Mcarthur Rossetti, MD;  Location: [facility];  Service: Orthopedics;  Laterality: Left;  . ULNAR TUNNEL RELEASE     right  . VENOUS ABLATION     x 2    There were no vitals filed for this visit.  irritable bowel syndrome) 01/21/2017  . Dyspnea 10/08/2016  . Paroxysmal atrial fibrillation ([facility]) 06/18/2016  . Type 2 diabetes mellitus without complication, without long-term current use of insulin ([facility]) 01/21/2016  . H/O Spinal surgery 12/21/2015  . Barrett's esophagus 12/21/2015  . Hx of adenomatous polyp of colon 03/01/2002   [name] PT, DPT, LAT, ATC  12/10/17  12:37 PM      [facility] [address] [place], [place], [address] Phone: [phone]   Fax:  [phone]  Name: [name] MRN: [id number] Date of Birth: 1950/05/09  [place], [place], [address] Phone: [phone]   Fax:  [phone]  Physical Therapy Treatment  Patient Details  Name: [name] MRN: [id number] Date of Birth: 1950/08/03 Referring Provider: Pete Pelt, PA-C   Encounter Date: 12/10/2017  PT End of Session - 12/10/17 1230    Visit Number  5    Number of Visits  17    Date for PT Re-Evaluation  01/05/18    PT Start Time  1200   pt arrived 15 min late   PT Stop Time  0962   8366   PT Time Calculation (min)  38 min    Activity Tolerance  Patient tolerated treatment well;Patient limited by pain    Behavior During Therapy  [facility] for tasks assessed/performed       Past Medical History:  Diagnosis Date  . Abnormal thyroid function test   . Allergy   . Anemia    she attributes previously to fibroids, she declines  . Barrett's esophagus   . Blood transfusion without reported diagnosis   . DDD (degenerative disc disease), cervical   . DJD (degenerative joint disease)   . GERD (gastroesophageal reflux disease)   . Heart murmur   . History of hiatal hernia   . Hx of adenomatous polyp of colon 03/01/2002  . OA (osteoarthritis)   . Obese   . Paroxysmal atrial fibrillation (HCC)   . PE (pulmonary embolism) 12/21/2015  . Plantar fasciitis   . PONV (postoperative nausea and vomiting)    pt. reports that she woke up during surgery  . Pre-diabetes     Past Surgical History:  Procedure Laterality Date  . ABDOMINAL HYSTERECTOMY    . Arthroscopic surgery left knee    . BACK SURGERY     2005 ,  2017  . CESAREAN SECTION    . CHOLECYSTECTOMY    . COLONOSCOPY  2003, 2011   3 mm adenoma 2011  . ESOPHAGOGASTRODUODENOSCOPY  multiple since 2003  . IR GENERIC HISTORICAL  12/19/2015   IR ANGIOGRAM SELECTIVE EACH ADDITIONAL VESSEL 12/19/2015 Greggory Keen, MD [facility]  . IR GENERIC HISTORICAL  12/19/2015   IR ANGIOGRAM PULMONARY BILATERAL  SELECTIVE 12/19/2015 Greggory Keen, MD [facility]  . IR GENERIC HISTORICAL  12/19/2015   IR INFUSION THROMBOL ARTERIAL INITIAL (MS) 12/19/2015 Greggory Keen, MD [facility]  . IR GENERIC HISTORICAL  12/19/2015   IR US GUIDE VASC ACCESS RIGHT 12/19/2015 Greggory Keen, MD [facility]  . IR GENERIC HISTORICAL  12/19/2015   IR ANGIOGRAM SELECTIVE EACH ADDITIONAL VESSEL 12/19/2015 Greggory Keen, MD [facility]  . IR GENERIC HISTORICAL  12/20/2015   IR THROMB F/U EVAL ART/VEN FINAL DAY (MS) 12/20/2015 Corrie Mckusick, DO [facility]  . IR GENERIC HISTORICAL  12/19/2015   IR INFUSION THROMBOL ARTERIAL INITIAL (MS) 12/19/2015 Greggory Keen, MD [facility]  . PLANTAR FASCIA RELEASE     right  . Right knee replacement    . SPINAL FUSION  10/31/2015   revision at [facility]    . TOTAL KNEE ARTHROPLASTY Left 10/13/2017  . TOTAL KNEE ARTHROPLASTY Left 10/13/2017   Procedure: LEFT TOTAL KNEE ARTHROPLASTY;  Surgeon: Mcarthur Rossetti, MD;  Location: [facility];  Service: Orthopedics;  Laterality: Left;  . ULNAR TUNNEL RELEASE     right  . VENOUS ABLATION     x 2    There were no vitals filed for this visit.  irritable bowel syndrome) 01/21/2017  . Dyspnea 10/08/2016  . Paroxysmal atrial fibrillation ([facility]) 06/18/2016  . Type 2 diabetes mellitus without complication, without long-term current use of insulin ([facility]) 01/21/2016  . H/O Spinal surgery 12/21/2015  . Barrett's esophagus 12/21/2015  . Hx of adenomatous polyp of colon 03/01/2002   [name] PT, DPT, LAT, ATC  12/10/17  12:37 PM      [facility] [address] [place], [place], [address] Phone: [phone]   Fax:  [phone]  Name: [name] MRN: [id number] Date of Birth: 1950/05/09

## 2017-12-15 ENCOUNTER — Encounter: Payer: Self-pay | Admitting: Physical Therapy

## 2017-12-15 ENCOUNTER — Ambulatory Visit: Payer: No Typology Code available for payment source | Admitting: Physical Therapy

## 2017-12-15 DIAGNOSIS — M25562 Pain in left knee: Secondary | ICD-10-CM

## 2017-12-15 DIAGNOSIS — M25662 Stiffness of left knee, not elsewhere classified: Secondary | ICD-10-CM

## 2017-12-15 DIAGNOSIS — R2689 Other abnormalities of gait and mobility: Secondary | ICD-10-CM

## 2017-12-15 DIAGNOSIS — G8929 Other chronic pain: Secondary | ICD-10-CM

## 2017-12-15 NOTE — Patient Instructions (Signed)
Keep exercising!

## 2017-12-15 NOTE — Therapy (Signed)
[facility] [address] [place], [place], [address] Phone: [phone]   Fax:  [phone]  Physical Therapy Treatment  Patient Details  Name: [name] MRN: [id number] Date of Birth: 07-17-1950 Referring Provider: Kirtland Bouchard, PA-C   Encounter Date: 12/15/2017  PT End of Session - 12/15/17 1253    Visit Number  6    Number of Visits  17    Date for PT Re-Evaluation  01/05/18    PT Start Time  1147    PT Stop Time  1242    PT Time Calculation (min)  55 min    Activity Tolerance  Patient tolerated treatment well    Behavior During Therapy  [facility] for tasks assessed/performed       Past Medical History:  Diagnosis Date  . Abnormal thyroid function test   . Allergy   . Anemia    she attributes previously to fibroids, she declines  . Barrett's esophagus   . Blood transfusion without reported diagnosis   . DDD (degenerative disc disease), cervical   . DJD (degenerative joint disease)   . GERD (gastroesophageal reflux disease)   . Heart murmur   . History of hiatal hernia   . Hx of adenomatous polyp of colon 03/01/2002  . OA (osteoarthritis)   . Obese   . Paroxysmal atrial fibrillation (HCC)   . PE (pulmonary embolism) 12/21/2015  . Plantar fasciitis   . PONV (postoperative nausea and vomiting)    pt. reports that she woke up during surgery  . Pre-diabetes     Past Surgical History:  Procedure Laterality Date  . ABDOMINAL HYSTERECTOMY    . Arthroscopic surgery left knee    . BACK SURGERY     2005 ,  2017  . CESAREAN SECTION    . CHOLECYSTECTOMY    . COLONOSCOPY  2003, 2011   3 mm adenoma 2011  . ESOPHAGOGASTRODUODENOSCOPY  multiple since 2003  . IR GENERIC HISTORICAL  12/19/2015   IR ANGIOGRAM SELECTIVE EACH ADDITIONAL VESSEL 12/19/2015 [name], MD [facility]  . IR GENERIC HISTORICAL  12/19/2015   IR ANGIOGRAM PULMONARY BILATERAL SELECTIVE 12/19/2015 [name], MD [facility]  . IR  GENERIC HISTORICAL  12/19/2015   IR INFUSION THROMBOL ARTERIAL INITIAL (MS) 12/19/2015 [name], MD [facility]  . IR GENERIC HISTORICAL  12/19/2015   IR US GUIDE VASC ACCESS RIGHT 12/19/2015 [name], MD [facility]  . IR GENERIC HISTORICAL  12/19/2015   IR ANGIOGRAM SELECTIVE EACH ADDITIONAL VESSEL 12/19/2015 [name], MD [facility]  . IR GENERIC HISTORICAL  12/20/2015   IR THROMB F/U EVAL ART/VEN FINAL DAY (MS) 12/20/2015 Gilmer Mor, DO [facility]  . IR GENERIC HISTORICAL  12/19/2015   IR INFUSION THROMBOL ARTERIAL INITIAL (MS) 12/19/2015 [name], MD [facility]  . PLANTAR FASCIA RELEASE     right  . Right knee replacement    . SPINAL FUSION  10/31/2015   revision at [facility]   . TONSILLECTOMY    . TOTAL KNEE ARTHROPLASTY Left 10/13/2017  . TOTAL KNEE ARTHROPLASTY Left 10/13/2017   Procedure: LEFT TOTAL KNEE ARTHROPLASTY;  Surgeon: Kathryne Hitch, MD;  Location: [facility];  Service: Orthopedics;  Laterality: Left;  . ULNAR TUNNEL RELEASE     right  . VENOUS ABLATION     x 2    There were no vitals filed for this visit.  Subjective Assessment - 12/15/17 1155    Subjective  I  have been doing the exercises.  pain 4-5/10  i have not been sleeping well.  I have been working on the swelling.      Currently in Pain?  Yes    Pain Score  5     Pain Location  Knee    Pain Orientation  Left    Pain Descriptors / Indicators  Aching;Tightness    Pain Type  Surgical pain    Pain Radiating Towards  into heft hip.     Pain Frequency  Intermittent    Aggravating Factors   bending over,      Pain Relieving Factors  standing up,  meds Ice  moving around    Multiple Pain Sites  --   back pain 4/10 tweeks,  long standing                      [facility] Adult PT Treatment/Exercise - 12/15/17 0001      Knee/Hip Exercises: Stretches   Other Knee/Hip Stretches  standing step stretch 10 x 10 seconds      Knee/Hip Exercises: Aerobic   Nustep   6 minutes L5      Knee/Hip Exercises: Standing   Forward Step Up  10 reps;1 set;Both;Step Height: 6"    Functional Squat Limitations  10   cued   Other Standing Knee Exercises  toe walking, heel walking 15 feet      Knee/Hip Exercises: Seated   Long Arc Quad  10 reps   10 second hold,  cued for neutral hip     Cryotherapy   Number Minutes Cryotherapy  10 Minutes    Cryotherapy Location  Knee    Type of Cryotherapy  --   cold pack     Manual Therapy   Joint Mobilization  Strap mobilization for flexion sitting             PT Education - 12/15/17 1252    Education Details  Exercise form    Person(s) Educated  Patient    Methods  Explanation;Demonstration;Tactile cues    Comprehension  Returned demonstration;Verbalized understanding       PT Short Term Goals - 12/01/17 1535      PT SHORT TERM GOAL #1   Title  pt will be I with HEP    Baseline  independent with exercise issued so far    Time  4    Period  Weeks    Status  On-going      PT SHORT TERM GOAL #2   Title  Pt will demonstrate decreased edema in LLE to be equal to RLE (46cm measured 10 deg distal to tibial tuberosity)    Baseline  edema varies,  recent US study    Time  4    Period  Weeks    Status  On-going        PT Long Term Goals - 11/10/17 1328      PT LONG TERM GOAL #1   Title  Pt will demonstrate increased L knee flexion AROM to >/= 105 degrees to improve functional mobility and improve gait mechanics    Baseline  L knee flexion 80 deg    Time  8    Period  Weeks    Status  New    Target Date  01/05/18      PT LONG TERM GOAL #2   Title  Pt will achieve 0 degrees of extension to improve functional mobility and improve gait mechanics  Baseline  lacking 17 degrees of L knee extension    Time  8    Period  Weeks    Status  New    Target Date  01/05/18      PT LONG TERM GOAL #3   Title  Pt will be able to stand and walk >/= 30 min with LRAD and </= 2/10 pain in order to be able to  perform work related duties     Baseline  pt unable to stand longer than 2-3 min and walk longer than 8 min    Time  8    Period  Weeks    Status  New    Target Date  01/05/18      PT LONG TERM GOAL #4   Title  Pt will decrease FOTO score to </= 56% limited to improve function an quality of life    Baseline  70% limited    Time  8    Period  Weeks    Status  New    Target Date  01/05/18      PT LONG TERM GOAL #5   Title  Pt will be I with HEP by final visit to ensure progress beyond discharge from physical therapy    Baseline  pt performing exercises from [facility] at home currently    Time  8    Period  Weeks    Status  New    Target Date  01/05/18            Plan - 12/15/17 1253    Clinical Impression Statement  Patient sitting AROM 95.  She has been working at home with her exercises.  Endurance gradually improving.    PT Next Visit Plan  Try steps.  quad strength terminal knee ROM/Extension,  balance    PT Home Exercise Plan  heel slides, SAQs, seated hamstring stretch    Consulted and Agree with Plan of Care  Patient       Patient will benefit from skilled therapeutic intervention in order to improve the following deficits and impairments:     Visit Diagnosis: Stiffness of left knee, not elsewhere classified  Other abnormalities of gait and mobility  Chronic pain of left knee     Problem List Patient Active Problem List   Diagnosis Date Noted  . Status post total left knee replacement 10/13/2017  . Family history of thyroid disease 08/25/2017  . Acute respiratory infection 08/13/2017  . Chronic pain of left knee 05/20/2017  . Unilateral primary osteoarthritis, left knee 05/20/2017  . Morbid obesity (HCC) 04/30/2017  . Adnexal mass 02/17/2017  . Family history of ovarian cancer 02/17/2017  . IBS (irritable bowel syndrome) 01/21/2017  . Dyspnea 10/08/2016  . Paroxysmal atrial fibrillation (HCC) 06/18/2016  . Type 2 diabetes mellitus without  complication, without long-term current use of insulin (HCC) 01/21/2016  . H/O Spinal surgery 12/21/2015  . Barrett's esophagus 12/21/2015  . Hx of adenomatous polyp of colon 03/01/2002    [name]  PTA 12/15/2017, 12:55 PM  [facility] [address] [place], [place], [address] Phone: [phone]   Fax:  [phone]  Name: [name] MRN: [id number] Date of Birth: 1951-03-09

## 2017-12-17 ENCOUNTER — Encounter: Payer: Self-pay | Admitting: Physical Therapy

## 2017-12-17 ENCOUNTER — Ambulatory Visit: Payer: No Typology Code available for payment source | Admitting: Physical Therapy

## 2017-12-17 DIAGNOSIS — M25562 Pain in left knee: Secondary | ICD-10-CM

## 2017-12-17 DIAGNOSIS — M25662 Stiffness of left knee, not elsewhere classified: Secondary | ICD-10-CM | POA: Diagnosis not present

## 2017-12-17 DIAGNOSIS — G8929 Other chronic pain: Secondary | ICD-10-CM

## 2017-12-17 DIAGNOSIS — R2689 Other abnormalities of gait and mobility: Secondary | ICD-10-CM

## 2017-12-17 NOTE — Therapy (Signed)
Title  Pt will be able to stand and walk >/= 30 min with LRAD and </= 2/10 pain in order to be able to perform work related duties     Baseline  pt unable to stand longer than 2-3 min and walk longer than 8 min    Time  8    Period  Weeks    Status  New    Target Date  01/05/18      PT LONG TERM GOAL #4    Title  Pt will decrease FOTO score to </= 56% limited to improve function an quality of life    Baseline  70% limited    Time  8    Period  Weeks    Status  New    Target Date  01/05/18      PT LONG TERM GOAL #5   Title  Pt will be I with HEP by final visit to ensure progress beyond discharge from physical therapy    Baseline  pt performing exercises from [facility] at home currently    Time  8    Period  Weeks    Status  New    Target Date  01/05/18            Plan - 12/17/17 1309    Clinical Impression Statement  AA heel slides 93 supine with patient in supine with strap.  mild pain increases with exercise.  Stretching has changed scar tissue and is hypersensitive.     PT Next Visit Plan  continuesteps.  quad strength terminal knee ROM/Extension,  eccentric quads,  standing exercises    PT Home Exercise Plan  heel slides, SAQs, seated hamstring stretch,  hamstring curl    Consulted and Agree with Plan of Care  Patient       Patient will benefit from skilled therapeutic intervention in order to improve the following deficits and impairments:     Visit Diagnosis: Stiffness of left knee, not elsewhere classified  Other abnormalities of gait and mobility  Chronic pain of left knee     Problem List Patient Active Problem List   Diagnosis Date Noted  . Status post total left knee replacement 10/13/2017  . Family history of thyroid disease 08/25/2017  . Acute respiratory infection 08/13/2017  . Chronic pain of left knee 05/20/2017  . Unilateral primary osteoarthritis, left knee 05/20/2017  . Morbid obesity ([place]) 04/30/2017  . Adnexal mass 02/17/2017  . Family history of ovarian cancer 02/17/2017  . IBS (irritable bowel syndrome) 01/21/2017  . Dyspnea 10/08/2016  . Paroxysmal atrial fibrillation ([place]) 06/18/2016  . Type 2 diabetes mellitus without complication, without long-term current use of insulin ([place]) 01/21/2016  . H/O Spinal surgery 12/21/2015  .  Barrett's esophagus 12/21/2015  . Hx of adenomatous polyp of colon 03/01/2002    Aspin Palomarez PTA 12/17/2017, 1:12 PM  [facility] [address] [place], [place], [address] Phone: [phone]   Fax:  [phone]  Name: [name] MRN: [id number] Date of Birth: 1951-01-10  Title  Pt will be able to stand and walk >/= 30 min with LRAD and </= 2/10 pain in order to be able to perform work related duties     Baseline  pt unable to stand longer than 2-3 min and walk longer than 8 min    Time  8    Period  Weeks    Status  New    Target Date  01/05/18      PT LONG TERM GOAL #4    Title  Pt will decrease FOTO score to </= 56% limited to improve function an quality of life    Baseline  70% limited    Time  8    Period  Weeks    Status  New    Target Date  01/05/18      PT LONG TERM GOAL #5   Title  Pt will be I with HEP by final visit to ensure progress beyond discharge from physical therapy    Baseline  pt performing exercises from [facility] at home currently    Time  8    Period  Weeks    Status  New    Target Date  01/05/18            Plan - 12/17/17 1309    Clinical Impression Statement  AA heel slides 93 supine with patient in supine with strap.  mild pain increases with exercise.  Stretching has changed scar tissue and is hypersensitive.     PT Next Visit Plan  continuesteps.  quad strength terminal knee ROM/Extension,  eccentric quads,  standing exercises    PT Home Exercise Plan  heel slides, SAQs, seated hamstring stretch,  hamstring curl    Consulted and Agree with Plan of Care  Patient       Patient will benefit from skilled therapeutic intervention in order to improve the following deficits and impairments:     Visit Diagnosis: Stiffness of left knee, not elsewhere classified  Other abnormalities of gait and mobility  Chronic pain of left knee     Problem List Patient Active Problem List   Diagnosis Date Noted  . Status post total left knee replacement 10/13/2017  . Family history of thyroid disease 08/25/2017  . Acute respiratory infection 08/13/2017  . Chronic pain of left knee 05/20/2017  . Unilateral primary osteoarthritis, left knee 05/20/2017  . Morbid obesity ([place]) 04/30/2017  . Adnexal mass 02/17/2017  . Family history of ovarian cancer 02/17/2017  . IBS (irritable bowel syndrome) 01/21/2017  . Dyspnea 10/08/2016  . Paroxysmal atrial fibrillation ([place]) 06/18/2016  . Type 2 diabetes mellitus without complication, without long-term current use of insulin ([place]) 01/21/2016  . H/O Spinal surgery 12/21/2015  .  Barrett's esophagus 12/21/2015  . Hx of adenomatous polyp of colon 03/01/2002    Aspin Palomarez PTA 12/17/2017, 1:12 PM  [facility] [address] [place], [place], [address] Phone: [phone]   Fax:  [phone]  Name: [name] MRN: [id number] Date of Birth: 1951-01-10  [place], [place], [address] Phone: [phone]   Fax:  [phone]  Physical Therapy Treatment  Patient Details  Name: [name] MRN: [id number] Date of Birth: 12/11/1950 Referring Provider: Pete Pelt, PA-C   Encounter Date: 12/17/2017  PT End of Session - 12/17/17 1308    Visit Number  7    Number of Visits  17    Date for PT Re-Evaluation  01/05/18    PT Start Time  5366    PT Stop Time  1244    PT Time Calculation (min)  56 min    Activity Tolerance  Patient tolerated treatment well    Behavior During Therapy  [facility] for tasks assessed/performed       Past Medical History:  Diagnosis Date  . Abnormal thyroid function test   . Allergy   . Anemia    she attributes previously to fibroids, she declines  . Barrett's esophagus   . Blood transfusion without reported diagnosis   . DDD (degenerative disc disease), cervical   . DJD (degenerative joint disease)   . GERD (gastroesophageal reflux disease)   . Heart murmur   . History of hiatal hernia   . Hx of adenomatous polyp of colon 03/01/2002  . OA (osteoarthritis)   . Obese   . Paroxysmal atrial fibrillation (HCC)   . PE (pulmonary embolism) 12/21/2015  . Plantar fasciitis   . PONV (postoperative nausea and vomiting)    pt. reports that she woke up during surgery  . Pre-diabetes     Past Surgical History:  Procedure Laterality Date  . ABDOMINAL HYSTERECTOMY    . Arthroscopic surgery left knee    . BACK SURGERY     2005 ,  2017  . CESAREAN SECTION    . CHOLECYSTECTOMY    . COLONOSCOPY  2003, 2011   3 mm adenoma 2011  . ESOPHAGOGASTRODUODENOSCOPY  multiple since 2003  . IR GENERIC HISTORICAL  12/19/2015   IR ANGIOGRAM SELECTIVE EACH ADDITIONAL VESSEL 12/19/2015 Greggory Keen, MD [facility]  . IR GENERIC HISTORICAL  12/19/2015   IR ANGIOGRAM PULMONARY BILATERAL SELECTIVE 12/19/2015 Greggory Keen, MD [facility]  . IR  GENERIC HISTORICAL  12/19/2015   IR INFUSION THROMBOL ARTERIAL INITIAL (MS) 12/19/2015 Greggory Keen, MD [facility]  . IR GENERIC HISTORICAL  12/19/2015   IR US GUIDE VASC ACCESS RIGHT 12/19/2015 Greggory Keen, MD [facility]  . IR GENERIC HISTORICAL  12/19/2015   IR ANGIOGRAM SELECTIVE EACH ADDITIONAL VESSEL 12/19/2015 Greggory Keen, MD [facility]  . IR GENERIC HISTORICAL  12/20/2015   IR THROMB F/U EVAL ART/VEN FINAL DAY (MS) 12/20/2015 Corrie Mckusick, DO [facility]  . IR GENERIC HISTORICAL  12/19/2015   IR INFUSION THROMBOL ARTERIAL INITIAL (MS) 12/19/2015 Greggory Keen, MD [facility]  . PLANTAR FASCIA RELEASE     right  . Right knee replacement    . SPINAL FUSION  10/31/2015   revision at [place]    . TOTAL KNEE ARTHROPLASTY Left 10/13/2017  . TOTAL KNEE ARTHROPLASTY Left 10/13/2017   Procedure: LEFT TOTAL KNEE ARTHROPLASTY;  Surgeon: Mcarthur Rossetti, MD;  Location: [facility];  Service: Orthopedics;  Laterality: Left;  . ULNAR TUNNEL RELEASE     right  . VENOUS ABLATION     x 2    There were no vitals filed for this visit.  Subjective Assessment - 12/17/17 1152    Subjective  Keloid

## 2017-12-17 NOTE — Patient Instructions (Signed)
Hamstring Curl: Resisted (Sitting)    Facing anchor with tubing on right ankle, leg straight out, bend knee. Repeat __10__ times per set. Do _1-3___ sets per session. Do __1 to 2__ sessions per day.   http://orth.exer.us/669   Copyright  VHI. All rights reserved.

## 2017-12-22 ENCOUNTER — Ambulatory Visit: Payer: No Typology Code available for payment source | Admitting: Physical Therapy

## 2017-12-23 ENCOUNTER — Encounter (INDEPENDENT_AMBULATORY_CARE_PROVIDER_SITE_OTHER): Payer: Self-pay | Admitting: Orthopaedic Surgery

## 2017-12-23 ENCOUNTER — Ambulatory Visit (INDEPENDENT_AMBULATORY_CARE_PROVIDER_SITE_OTHER): Payer: No Typology Code available for payment source | Admitting: Orthopaedic Surgery

## 2017-12-23 DIAGNOSIS — Z96652 Presence of left artificial knee joint: Secondary | ICD-10-CM

## 2017-12-23 MED ORDER — HYDROCODONE-ACETAMINOPHEN 5-325 MG PO TABS: 1.0000 | ORAL_TABLET | Freq: Four times a day (QID) | ORAL | 0 refills | Status: DC | PRN

## 2017-12-23 NOTE — Progress Notes (Signed)
HPI: Mrs. Troop returns today now 71 days status post left total knee arthroplasty.  She states that she is doing okay.  She does feel that she is gaining as much range of motion and strength in this knee as she did status post the right total knee arthroplasty.  She also feels that she does not have any stamina.  Does occasionally get lightheaded she denies any chest pain or shortness of breath.  She is working with physical therapy she is having some thigh hamstring spasms is taking Robaxin for this.  She is requesting a refill on her Norco today.  Physical exam: General: Well-developed well-nourished female in no acute distress. Psych: Alert and oriented x3 Left knee she has full extension flexion to 90 degrees.  Calf supple nontender.  Surgical incisions healing with a bit of keloid but otherwise no signs of infection.  Impression: Status post left total knee arthroplasty 10/13/2017  Plan: She will continue to work with therapy on range of motion strengthening.  Refill on her  Norco was given today.  Have her follow-up with her primary care physician due to her lightheadedness and decreased stamina.  She is out of work will reevaluate work status at return visit in 1 month.  Questions are encouraged and answered by Dr. Ninfa Linden and myself.

## 2017-12-24 ENCOUNTER — Ambulatory Visit: Payer: No Typology Code available for payment source | Attending: Orthopaedic Surgery | Admitting: Physical Therapy

## 2017-12-24 ENCOUNTER — Encounter: Payer: Self-pay | Admitting: Physical Therapy

## 2017-12-24 DIAGNOSIS — G8929 Other chronic pain: Secondary | ICD-10-CM

## 2017-12-24 DIAGNOSIS — M25662 Stiffness of left knee, not elsewhere classified: Secondary | ICD-10-CM | POA: Diagnosis not present

## 2017-12-24 DIAGNOSIS — R2689 Other abnormalities of gait and mobility: Secondary | ICD-10-CM

## 2017-12-24 DIAGNOSIS — M25562 Pain in left knee: Secondary | ICD-10-CM | POA: Diagnosis present

## 2017-12-24 NOTE — Therapy (Signed)
pain in order to be able to perform work related duties     Baseline  pt unable to stand longer than 2-3 min and walk longer than 8 min    Time  8    Period  Weeks    Status  New    Target Date  01/05/18      PT LONG TERM GOAL #4   Title  Pt will decrease FOTO score to </= 56% limited to improve function an quality of  life    Baseline  70% limited    Time  8    Period  Weeks    Status  New    Target Date  01/05/18      PT LONG TERM GOAL #5   Title  Pt will be I with HEP by final visit to ensure progress beyond discharge from physical therapy    Baseline  pt performing exercises from [facility] at home currently    Time  8    Period  Weeks    Status  New    Target Date  01/05/18            Plan - 12/24/17 1236    Clinical Impression Statement  pt continues to report pain but demonstrates improvement in mobility to 90 degrees but visually improved during reciprocal inhibition technique. continued knee strengthening and stretching. end of session she reported decreased pain and declined modalities.     PT Treatment/Interventions  Cryotherapy;Electrical Stimulation;ADLs/Self Care Home Management;Iontophoresis 4mg /ml Dexamethasone;Moist Heat;Ultrasound;DME Instruction;Gait training;Stair training;Functional mobility training;Therapeutic activities;Therapeutic exercise;Balance training;Neuromuscular re-education;Patient/family education;Manual techniques;Passive range of motion;Dry needling;Energy conservation;Taping    PT Next Visit Plan  continue steps.  quad strength terminal knee ROM/Extension,  eccentric quads,  standing exercises    PT Home Exercise Plan  heel slides, SAQs, seated hamstring stretch,  hamstring curl    Consulted and Agree with Plan of Care  Patient       Patient will benefit from skilled therapeutic intervention in order to improve the following deficits and impairments:  Abnormal gait, Improper body mechanics, Impaired sensation, Pain, Decreased mobility, Decreased scar mobility, Postural dysfunction, Decreased activity tolerance, Decreased endurance, Decreased range of motion, Decreased strength, Hypomobility, Decreased balance, Difficulty walking, Increased edema, Impaired flexibility, Obesity  Visit Diagnosis: Stiffness of left knee, not elsewhere classified  Other  abnormalities of gait and mobility  Chronic pain of left knee     Problem List Patient Active Problem List   Diagnosis Date Noted  . Status post total left knee replacement 10/13/2017  . Family history of thyroid disease 08/25/2017  . Acute respiratory infection 08/13/2017  . Chronic pain of left knee 05/20/2017  . Unilateral primary osteoarthritis, left knee 05/20/2017  . Morbid obesity ([place]) 04/30/2017  . Adnexal mass 02/17/2017  . Family history of ovarian cancer 02/17/2017  . IBS (irritable bowel syndrome) 01/21/2017  . Dyspnea 10/08/2016  . Paroxysmal atrial fibrillation ([place]) 06/18/2016  . Type 2 diabetes mellitus without complication, without long-term current use of insulin ([place]) 01/21/2016  . H/O Spinal surgery 12/21/2015  . Barrett's esophagus 12/21/2015  . Hx of adenomatous polyp of colon 03/01/2002   [name] PT, DPT, LAT, ATC  12/24/17  12:39 PM      [facility] [address] [place], [place], [address] Phone: [phone]   Fax:  [phone]  Name: [name] MRN: [id number] Date of Birth: 11-15-1950  [place], [place], [address] Phone: [phone]   Fax:  [phone]  Physical Therapy Treatment  Patient Details  Name: MEGANNE RITA MRN: [id number] Date of Birth: [birth year] Referring Provider: Pete Pelt, PA-C   Encounter Date: 12/24/2017  PT End of Session - 12/24/17 1150    Visit Number  8    Number of Visits  17    Date for PT Re-Evaluation  01/05/18    PT Start Time  1150    PT Stop Time  1232    PT Time Calculation (min)  42 min    Activity Tolerance  Patient tolerated treatment well    Behavior During Therapy  [facility] for tasks assessed/performed       Past Medical History:  Diagnosis Date  . Abnormal thyroid function test   . Allergy   . Anemia    she attributes previously to fibroids, she declines  . Barrett's esophagus   . Blood transfusion without reported diagnosis   . DDD (degenerative disc disease), cervical   . DJD (degenerative joint disease)   . GERD (gastroesophageal reflux disease)   . Heart murmur   . History of hiatal hernia   . Hx of adenomatous polyp of colon 03/01/2002  . OA (osteoarthritis)   . Obese   . Paroxysmal atrial fibrillation (HCC)   . PE (pulmonary embolism) 12/21/2015  . Plantar fasciitis   . PONV (postoperative nausea and vomiting)    pt. reports that she woke up during surgery  . Pre-diabetes     Past Surgical History:  Procedure Laterality Date  . ABDOMINAL HYSTERECTOMY    . Arthroscopic surgery left knee    . BACK SURGERY     2005 ,  2017  . CESAREAN SECTION    . CHOLECYSTECTOMY    . COLONOSCOPY  2003, 2011   3 mm adenoma 2011  . ESOPHAGOGASTRODUODENOSCOPY  multiple since 2003  . IR GENERIC HISTORICAL  12/19/2015   IR ANGIOGRAM SELECTIVE EACH ADDITIONAL VESSEL 12/19/2015 Greggory Keen, MD [facility]  . IR GENERIC HISTORICAL  12/19/2015   IR ANGIOGRAM PULMONARY BILATERAL SELECTIVE 12/19/2015 Greggory Keen, MD [facility]  . IR  GENERIC HISTORICAL  12/19/2015   IR INFUSION THROMBOL ARTERIAL INITIAL (MS) 12/19/2015 Greggory Keen, MD [facility]  . IR GENERIC HISTORICAL  12/19/2015   IR US GUIDE VASC ACCESS RIGHT 12/19/2015 Greggory Keen, MD [facility]  . IR GENERIC HISTORICAL  12/19/2015   IR ANGIOGRAM SELECTIVE EACH ADDITIONAL VESSEL 12/19/2015 Greggory Keen, MD [facility]  . IR GENERIC HISTORICAL  12/20/2015   IR THROMB F/U EVAL ART/VEN FINAL DAY (MS) 12/20/2015 Corrie Mckusick, DO [facility]  . IR GENERIC HISTORICAL  12/19/2015   IR INFUSION THROMBOL ARTERIAL INITIAL (MS) 12/19/2015 Greggory Keen, MD [facility]  . PLANTAR FASCIA RELEASE     right  . Right knee replacement    . SPINAL FUSION  10/31/2015   revision at [place]    . TOTAL KNEE ARTHROPLASTY Left 10/13/2017  . TOTAL KNEE ARTHROPLASTY Left 10/13/2017   Procedure: LEFT TOTAL KNEE ARTHROPLASTY;  Surgeon: Mcarthur Rossetti, MD;  Location: [facility];  Service: Orthopedics;  Laterality: Left;  . ULNAR TUNNEL RELEASE     right  . VENOUS ABLATION     x 2    There were no vitals filed for this visit.  Subjective Assessment - 12/24/17 1149    Subjective  "I  pain in order to be able to perform work related duties     Baseline  pt unable to stand longer than 2-3 min and walk longer than 8 min    Time  8    Period  Weeks    Status  New    Target Date  01/05/18      PT LONG TERM GOAL #4   Title  Pt will decrease FOTO score to </= 56% limited to improve function an quality of  life    Baseline  70% limited    Time  8    Period  Weeks    Status  New    Target Date  01/05/18      PT LONG TERM GOAL #5   Title  Pt will be I with HEP by final visit to ensure progress beyond discharge from physical therapy    Baseline  pt performing exercises from [facility] at home currently    Time  8    Period  Weeks    Status  New    Target Date  01/05/18            Plan - 12/24/17 1236    Clinical Impression Statement  pt continues to report pain but demonstrates improvement in mobility to 90 degrees but visually improved during reciprocal inhibition technique. continued knee strengthening and stretching. end of session she reported decreased pain and declined modalities.     PT Treatment/Interventions  Cryotherapy;Electrical Stimulation;ADLs/Self Care Home Management;Iontophoresis 4mg /ml Dexamethasone;Moist Heat;Ultrasound;DME Instruction;Gait training;Stair training;Functional mobility training;Therapeutic activities;Therapeutic exercise;Balance training;Neuromuscular re-education;Patient/family education;Manual techniques;Passive range of motion;Dry needling;Energy conservation;Taping    PT Next Visit Plan  continue steps.  quad strength terminal knee ROM/Extension,  eccentric quads,  standing exercises    PT Home Exercise Plan  heel slides, SAQs, seated hamstring stretch,  hamstring curl    Consulted and Agree with Plan of Care  Patient       Patient will benefit from skilled therapeutic intervention in order to improve the following deficits and impairments:  Abnormal gait, Improper body mechanics, Impaired sensation, Pain, Decreased mobility, Decreased scar mobility, Postural dysfunction, Decreased activity tolerance, Decreased endurance, Decreased range of motion, Decreased strength, Hypomobility, Decreased balance, Difficulty walking, Increased edema, Impaired flexibility, Obesity  Visit Diagnosis: Stiffness of left knee, not elsewhere classified  Other  abnormalities of gait and mobility  Chronic pain of left knee     Problem List Patient Active Problem List   Diagnosis Date Noted  . Status post total left knee replacement 10/13/2017  . Family history of thyroid disease 08/25/2017  . Acute respiratory infection 08/13/2017  . Chronic pain of left knee 05/20/2017  . Unilateral primary osteoarthritis, left knee 05/20/2017  . Morbid obesity ([place]) 04/30/2017  . Adnexal mass 02/17/2017  . Family history of ovarian cancer 02/17/2017  . IBS (irritable bowel syndrome) 01/21/2017  . Dyspnea 10/08/2016  . Paroxysmal atrial fibrillation ([place]) 06/18/2016  . Type 2 diabetes mellitus without complication, without long-term current use of insulin ([place]) 01/21/2016  . H/O Spinal surgery 12/21/2015  . Barrett's esophagus 12/21/2015  . Hx of adenomatous polyp of colon 03/01/2002   [name] PT, DPT, LAT, ATC  12/24/17  12:39 PM      [facility] [address] [place], [place], [address] Phone: [phone]   Fax:  [phone]  Name: [name] MRN: [id number] Date of Birth: 11-15-1950

## 2017-12-29 ENCOUNTER — Ambulatory Visit: Payer: No Typology Code available for payment source | Admitting: Physical Therapy

## 2017-12-29 ENCOUNTER — Encounter: Payer: Self-pay | Admitting: Physical Therapy

## 2017-12-29 DIAGNOSIS — M25662 Stiffness of left knee, not elsewhere classified: Secondary | ICD-10-CM | POA: Diagnosis not present

## 2017-12-29 DIAGNOSIS — M25562 Pain in left knee: Secondary | ICD-10-CM

## 2017-12-29 DIAGNOSIS — R2689 Other abnormalities of gait and mobility: Secondary | ICD-10-CM

## 2017-12-29 DIAGNOSIS — G8929 Other chronic pain: Secondary | ICD-10-CM

## 2017-12-29 NOTE — Therapy (Signed)
[place], [place], [address] Phone: [phone]   Fax:  [phone]  Physical Therapy Treatment  Patient Details  Name: [name] MRN: [id number] Date of Birth: 1950/11/03 Referring Provider: Pete Pelt, PA-C   Encounter Date: 12/29/2017  PT End of Session - 12/29/17 1311    Visit Number  9    Number of Visits  17    Date for PT Re-Evaluation  01/05/18    PT Start Time  3329    PT Stop Time  1243    PT Time Calculation (min)  54 min    Activity Tolerance  Patient tolerated treatment well    Behavior During Therapy  [facility] for tasks assessed/performed       Past Medical History:  Diagnosis Date  . Abnormal thyroid function test   . Allergy   . Anemia    she attributes previously to fibroids, she declines  . Barrett's esophagus   . Blood transfusion without reported diagnosis   . DDD (degenerative disc disease), cervical   . DJD (degenerative joint disease)   . GERD (gastroesophageal reflux disease)   . Heart murmur   . History of hiatal hernia   . Hx of adenomatous polyp of colon 03/01/2002  . OA (osteoarthritis)   . Obese   . Paroxysmal atrial fibrillation (HCC)   . PE (pulmonary embolism) 12/21/2015  . Plantar fasciitis   . PONV (postoperative nausea and vomiting)    pt. reports that she woke up during surgery  . Pre-diabetes     Past Surgical History:  Procedure Laterality Date  . ABDOMINAL HYSTERECTOMY    . Arthroscopic surgery left knee    . BACK SURGERY     2005 ,  2017  . CESAREAN SECTION    . CHOLECYSTECTOMY    . COLONOSCOPY  2003, 2011   3 mm adenoma 2011  . ESOPHAGOGASTRODUODENOSCOPY  multiple since 2003  . IR GENERIC HISTORICAL  12/19/2015   IR ANGIOGRAM SELECTIVE EACH ADDITIONAL VESSEL 12/19/2015 Greggory Keen, MD [facility]  . IR GENERIC HISTORICAL  12/19/2015   IR ANGIOGRAM PULMONARY BILATERAL SELECTIVE 12/19/2015 Greggory Keen, MD [facility]  . IR  GENERIC HISTORICAL  12/19/2015   IR INFUSION THROMBOL ARTERIAL INITIAL (MS) 12/19/2015 Greggory Keen, MD [facility]  . IR GENERIC HISTORICAL  12/19/2015   IR US GUIDE VASC ACCESS RIGHT 12/19/2015 Greggory Keen, MD [facility]  . IR GENERIC HISTORICAL  12/19/2015   IR ANGIOGRAM SELECTIVE EACH ADDITIONAL VESSEL 12/19/2015 Greggory Keen, MD [facility]  . IR GENERIC HISTORICAL  12/20/2015   IR THROMB F/U EVAL ART/VEN FINAL DAY (MS) 12/20/2015 Corrie Mckusick, DO [facility]  . IR GENERIC HISTORICAL  12/19/2015   IR INFUSION THROMBOL ARTERIAL INITIAL (MS) 12/19/2015 Greggory Keen, MD [facility]  . PLANTAR FASCIA RELEASE     right  . Right knee replacement    . SPINAL FUSION  10/31/2015   revision at [place]    . TOTAL KNEE ARTHROPLASTY Left 10/13/2017  . TOTAL KNEE ARTHROPLASTY Left 10/13/2017   Procedure: LEFT TOTAL KNEE ARTHROPLASTY;  Surgeon: Mcarthur Rossetti, MD;  Location: [facility];  Service: Orthopedics;  Laterality: Left;  . ULNAR TUNNEL RELEASE     right  . VENOUS ABLATION     x 2    There were no vitals filed for this visit.  Subjective Assessment - 12/29/17 1156    Subjective  I  [place], [place], [address] Phone: [phone]   Fax:  [phone]  Physical Therapy Treatment  Patient Details  Name: [name] MRN: [id number] Date of Birth: 1950/11/03 Referring Provider: Pete Pelt, PA-C   Encounter Date: 12/29/2017  PT End of Session - 12/29/17 1311    Visit Number  9    Number of Visits  17    Date for PT Re-Evaluation  01/05/18    PT Start Time  3329    PT Stop Time  1243    PT Time Calculation (min)  54 min    Activity Tolerance  Patient tolerated treatment well    Behavior During Therapy  [facility] for tasks assessed/performed       Past Medical History:  Diagnosis Date  . Abnormal thyroid function test   . Allergy   . Anemia    she attributes previously to fibroids, she declines  . Barrett's esophagus   . Blood transfusion without reported diagnosis   . DDD (degenerative disc disease), cervical   . DJD (degenerative joint disease)   . GERD (gastroesophageal reflux disease)   . Heart murmur   . History of hiatal hernia   . Hx of adenomatous polyp of colon 03/01/2002  . OA (osteoarthritis)   . Obese   . Paroxysmal atrial fibrillation (HCC)   . PE (pulmonary embolism) 12/21/2015  . Plantar fasciitis   . PONV (postoperative nausea and vomiting)    pt. reports that she woke up during surgery  . Pre-diabetes     Past Surgical History:  Procedure Laterality Date  . ABDOMINAL HYSTERECTOMY    . Arthroscopic surgery left knee    . BACK SURGERY     2005 ,  2017  . CESAREAN SECTION    . CHOLECYSTECTOMY    . COLONOSCOPY  2003, 2011   3 mm adenoma 2011  . ESOPHAGOGASTRODUODENOSCOPY  multiple since 2003  . IR GENERIC HISTORICAL  12/19/2015   IR ANGIOGRAM SELECTIVE EACH ADDITIONAL VESSEL 12/19/2015 Greggory Keen, MD [facility]  . IR GENERIC HISTORICAL  12/19/2015   IR ANGIOGRAM PULMONARY BILATERAL SELECTIVE 12/19/2015 Greggory Keen, MD [facility]  . IR  GENERIC HISTORICAL  12/19/2015   IR INFUSION THROMBOL ARTERIAL INITIAL (MS) 12/19/2015 Greggory Keen, MD [facility]  . IR GENERIC HISTORICAL  12/19/2015   IR US GUIDE VASC ACCESS RIGHT 12/19/2015 Greggory Keen, MD [facility]  . IR GENERIC HISTORICAL  12/19/2015   IR ANGIOGRAM SELECTIVE EACH ADDITIONAL VESSEL 12/19/2015 Greggory Keen, MD [facility]  . IR GENERIC HISTORICAL  12/20/2015   IR THROMB F/U EVAL ART/VEN FINAL DAY (MS) 12/20/2015 Corrie Mckusick, DO [facility]  . IR GENERIC HISTORICAL  12/19/2015   IR INFUSION THROMBOL ARTERIAL INITIAL (MS) 12/19/2015 Greggory Keen, MD [facility]  . PLANTAR FASCIA RELEASE     right  . Right knee replacement    . SPINAL FUSION  10/31/2015   revision at [place]    . TOTAL KNEE ARTHROPLASTY Left 10/13/2017  . TOTAL KNEE ARTHROPLASTY Left 10/13/2017   Procedure: LEFT TOTAL KNEE ARTHROPLASTY;  Surgeon: Mcarthur Rossetti, MD;  Location: [facility];  Service: Orthopedics;  Laterality: Left;  . ULNAR TUNNEL RELEASE     right  . VENOUS ABLATION     x 2    There were no vitals filed for this visit.  Subjective Assessment - 12/29/17 1156    Subjective  I  [place], [place], [address] Phone: [phone]   Fax:  [phone]  Physical Therapy Treatment  Patient Details  Name: [name] MRN: [id number] Date of Birth: 1950/11/03 Referring Provider: Pete Pelt, PA-C   Encounter Date: 12/29/2017  PT End of Session - 12/29/17 1311    Visit Number  9    Number of Visits  17    Date for PT Re-Evaluation  01/05/18    PT Start Time  3329    PT Stop Time  1243    PT Time Calculation (min)  54 min    Activity Tolerance  Patient tolerated treatment well    Behavior During Therapy  [facility] for tasks assessed/performed       Past Medical History:  Diagnosis Date  . Abnormal thyroid function test   . Allergy   . Anemia    she attributes previously to fibroids, she declines  . Barrett's esophagus   . Blood transfusion without reported diagnosis   . DDD (degenerative disc disease), cervical   . DJD (degenerative joint disease)   . GERD (gastroesophageal reflux disease)   . Heart murmur   . History of hiatal hernia   . Hx of adenomatous polyp of colon 03/01/2002  . OA (osteoarthritis)   . Obese   . Paroxysmal atrial fibrillation (HCC)   . PE (pulmonary embolism) 12/21/2015  . Plantar fasciitis   . PONV (postoperative nausea and vomiting)    pt. reports that she woke up during surgery  . Pre-diabetes     Past Surgical History:  Procedure Laterality Date  . ABDOMINAL HYSTERECTOMY    . Arthroscopic surgery left knee    . BACK SURGERY     2005 ,  2017  . CESAREAN SECTION    . CHOLECYSTECTOMY    . COLONOSCOPY  2003, 2011   3 mm adenoma 2011  . ESOPHAGOGASTRODUODENOSCOPY  multiple since 2003  . IR GENERIC HISTORICAL  12/19/2015   IR ANGIOGRAM SELECTIVE EACH ADDITIONAL VESSEL 12/19/2015 Greggory Keen, MD [facility]  . IR GENERIC HISTORICAL  12/19/2015   IR ANGIOGRAM PULMONARY BILATERAL SELECTIVE 12/19/2015 Greggory Keen, MD [facility]  . IR  GENERIC HISTORICAL  12/19/2015   IR INFUSION THROMBOL ARTERIAL INITIAL (MS) 12/19/2015 Greggory Keen, MD [facility]  . IR GENERIC HISTORICAL  12/19/2015   IR US GUIDE VASC ACCESS RIGHT 12/19/2015 Greggory Keen, MD [facility]  . IR GENERIC HISTORICAL  12/19/2015   IR ANGIOGRAM SELECTIVE EACH ADDITIONAL VESSEL 12/19/2015 Greggory Keen, MD [facility]  . IR GENERIC HISTORICAL  12/20/2015   IR THROMB F/U EVAL ART/VEN FINAL DAY (MS) 12/20/2015 Corrie Mckusick, DO [facility]  . IR GENERIC HISTORICAL  12/19/2015   IR INFUSION THROMBOL ARTERIAL INITIAL (MS) 12/19/2015 Greggory Keen, MD [facility]  . PLANTAR FASCIA RELEASE     right  . Right knee replacement    . SPINAL FUSION  10/31/2015   revision at [place]    . TOTAL KNEE ARTHROPLASTY Left 10/13/2017  . TOTAL KNEE ARTHROPLASTY Left 10/13/2017   Procedure: LEFT TOTAL KNEE ARTHROPLASTY;  Surgeon: Mcarthur Rossetti, MD;  Location: [facility];  Service: Orthopedics;  Laterality: Left;  . ULNAR TUNNEL RELEASE     right  . VENOUS ABLATION     x 2    There were no vitals filed for this visit.  Subjective Assessment - 12/29/17 1156    Subjective  I  H/O Spinal surgery 12/21/2015  . Barrett's esophagus 12/21/2015  . Hx of adenomatous polyp of colon 03/01/2002    HARRIS,KAREN PTA 12/29/2017, 1:17 PM  [facility] [address] [place], [place], [address] Phone: [phone]   Fax:  [phone]  Name: QUINN BARTLING MRN: [id number] Date of Birth: [birth year]

## 2017-12-31 ENCOUNTER — Encounter: Payer: Self-pay | Admitting: Physical Therapy

## 2017-12-31 ENCOUNTER — Ambulatory Visit: Payer: No Typology Code available for payment source | Admitting: Physical Therapy

## 2017-12-31 DIAGNOSIS — M25662 Stiffness of left knee, not elsewhere classified: Secondary | ICD-10-CM | POA: Diagnosis not present

## 2017-12-31 DIAGNOSIS — R2689 Other abnormalities of gait and mobility: Secondary | ICD-10-CM

## 2017-12-31 DIAGNOSIS — M25562 Pain in left knee: Secondary | ICD-10-CM

## 2017-12-31 DIAGNOSIS — G8929 Other chronic pain: Secondary | ICD-10-CM

## 2017-12-31 NOTE — Therapy (Addendum)
[place], [place], [address] Phone: [phone]   Fax:  [phone]  Physical Therapy Treatment Progress Note Reporting Period 11/10/2017 to 12/31/2017  See note below for Objective Data and Assessment of Progress/Goals.    Patient Details  Name: [name] MRN: [id number] Date of Birth: Nov 22, 1950 Referring Provider: Pete Pelt, PA-C   Encounter Date: 12/31/2017  PT End of Session - 12/31/17 1417    Visit Number  10    Number of Visits  17    Date for PT Re-Evaluation  01/05/18    PT Start Time  1330   short session due to sick patient   PT Stop Time  1403    PT Time Calculation (min)  33 min    Activity Tolerance  Patient tolerated treatment well    Behavior During Therapy  [facility] for tasks assessed/performed       Past Medical History:  Diagnosis Date  . Abnormal thyroid function test   . Allergy   . Anemia    she attributes previously to fibroids, she declines  . Barrett's esophagus   . Blood transfusion without reported diagnosis   . DDD (degenerative disc disease), cervical   . DJD (degenerative joint disease)   . GERD (gastroesophageal reflux disease)   . Heart murmur   . History of hiatal hernia   . Hx of adenomatous polyp of colon 03/01/2002  . OA (osteoarthritis)   . Obese   . Paroxysmal atrial fibrillation (HCC)   . PE (pulmonary embolism) 12/21/2015  . Plantar fasciitis   . PONV (postoperative nausea and vomiting)    pt. reports that she woke up during surgery  . Pre-diabetes     Past Surgical History:  Procedure Laterality Date  . ABDOMINAL HYSTERECTOMY    . Arthroscopic surgery left knee    . BACK SURGERY     2005 ,  2017  . CESAREAN SECTION    . CHOLECYSTECTOMY    . COLONOSCOPY  2003, 2011   3 mm adenoma 2011  . ESOPHAGOGASTRODUODENOSCOPY  multiple since 2003  . IR GENERIC HISTORICAL  12/19/2015   IR ANGIOGRAM SELECTIVE EACH ADDITIONAL VESSEL 12/19/2015  Greggory Keen, MD [facility]  . IR GENERIC HISTORICAL  12/19/2015   IR ANGIOGRAM PULMONARY BILATERAL SELECTIVE 12/19/2015 Greggory Keen, MD [facility]  . IR GENERIC HISTORICAL  12/19/2015   IR INFUSION THROMBOL ARTERIAL INITIAL (MS) 12/19/2015 Greggory Keen, MD [facility]  . IR GENERIC HISTORICAL  12/19/2015   IR US GUIDE VASC ACCESS RIGHT 12/19/2015 Greggory Keen, MD [facility]  . IR GENERIC HISTORICAL  12/19/2015   IR ANGIOGRAM SELECTIVE EACH ADDITIONAL VESSEL 12/19/2015 Greggory Keen, MD [facility]  . IR GENERIC HISTORICAL  12/20/2015   IR THROMB F/U EVAL ART/VEN FINAL DAY (MS) 12/20/2015 Corrie Mckusick, DO [facility]  . IR GENERIC HISTORICAL  12/19/2015   IR INFUSION THROMBOL ARTERIAL INITIAL (MS) 12/19/2015 Greggory Keen, MD [facility]  . PLANTAR FASCIA RELEASE     right  . Right knee replacement    . SPINAL FUSION  10/31/2015   revision at [facility]    . TOTAL KNEE ARTHROPLASTY Left 10/13/2017  . TOTAL KNEE ARTHROPLASTY Left 10/13/2017   Procedure: LEFT TOTAL KNEE ARTHROPLASTY;  Surgeon: Mcarthur Rossetti, MD;  Location: [facility];  Service: Orthopedics;  Laterality: Left;  . ULNAR TUNNEL RELEASE     right  . VENOUS ABLATION  to </= 56% limited to improve function an quality of life    Baseline  70% limited    Time  8    Period  Weeks    Status  New    Target Date  01/05/18      PT LONG TERM GOAL #5   Title  Pt will be I with HEP by final visit to ensure progress beyond discharge from physical therapy    Baseline  pt performing exercises from  [facility] at home currently    Time  8    Period  Weeks    Status  New    Target Date  01/05/18            Plan - 12/31/17 1407    Clinical Impression Statement  Patient able to mak full revolutions on bike 7 + X (  manual prior helpful)  IBS limiting today.  Sore post session anterior knee  (mild)  STG#1 met.  Unable to do FOTO due to shortened session due to IBS complication.    PT Next Visit Plan  ERO,  Needs FOTO    PT Home Exercise Plan  heel slides, SAQs, seated hamstring stretch,  hamstring curl    Consulted and Agree with Plan of Care  Patient       Patient will benefit from skilled therapeutic intervention in order to improve the following deficits and impairments:     Visit Diagnosis: Stiffness of left knee, not elsewhere classified  Other abnormalities of gait and mobility  Chronic pain of left knee     Problem List Patient Active Problem List   Diagnosis Date Noted  . Status post total left knee replacement 10/13/2017  . Family history of thyroid disease 08/25/2017  . Acute respiratory infection 08/13/2017  . Chronic pain of left knee 05/20/2017  . Unilateral primary osteoarthritis, left knee 05/20/2017  . Morbid obesity ([place]) 04/30/2017  . Adnexal mass 02/17/2017  . Family history of ovarian cancer 02/17/2017  . IBS (irritable bowel syndrome) 01/21/2017  . Dyspnea 10/08/2016  . Paroxysmal atrial fibrillation ([place]) 06/18/2016  . Type 2 diabetes mellitus without complication, without long-term current use of insulin ([place]) 01/21/2016  . H/O Spinal surgery 12/21/2015  . Barrett's esophagus 12/21/2015  . Hx of adenomatous polyp of colon 03/01/2002    [name] PTA 12/31/2017, 2:18 PM  [facility] [address] [place], [place], [address] Phone: [phone]   Fax:  [phone]  Name: [name] MRN: [id number] Date of Birth: 09-Dec-1950  [name] PT, DPT, LAT, ATC   12/31/17  2:32 PM  [place], [place], [address] Phone: [phone]   Fax:  [phone]  Physical Therapy Treatment Progress Note Reporting Period 11/10/2017 to 12/31/2017  See note below for Objective Data and Assessment of Progress/Goals.    Patient Details  Name: [name] MRN: [id number] Date of Birth: Nov 22, 1950 Referring Provider: Pete Pelt, PA-C   Encounter Date: 12/31/2017  PT End of Session - 12/31/17 1417    Visit Number  10    Number of Visits  17    Date for PT Re-Evaluation  01/05/18    PT Start Time  1330   short session due to sick patient   PT Stop Time  1403    PT Time Calculation (min)  33 min    Activity Tolerance  Patient tolerated treatment well    Behavior During Therapy  [facility] for tasks assessed/performed       Past Medical History:  Diagnosis Date  . Abnormal thyroid function test   . Allergy   . Anemia    she attributes previously to fibroids, she declines  . Barrett's esophagus   . Blood transfusion without reported diagnosis   . DDD (degenerative disc disease), cervical   . DJD (degenerative joint disease)   . GERD (gastroesophageal reflux disease)   . Heart murmur   . History of hiatal hernia   . Hx of adenomatous polyp of colon 03/01/2002  . OA (osteoarthritis)   . Obese   . Paroxysmal atrial fibrillation (HCC)   . PE (pulmonary embolism) 12/21/2015  . Plantar fasciitis   . PONV (postoperative nausea and vomiting)    pt. reports that she woke up during surgery  . Pre-diabetes     Past Surgical History:  Procedure Laterality Date  . ABDOMINAL HYSTERECTOMY    . Arthroscopic surgery left knee    . BACK SURGERY     2005 ,  2017  . CESAREAN SECTION    . CHOLECYSTECTOMY    . COLONOSCOPY  2003, 2011   3 mm adenoma 2011  . ESOPHAGOGASTRODUODENOSCOPY  multiple since 2003  . IR GENERIC HISTORICAL  12/19/2015   IR ANGIOGRAM SELECTIVE EACH ADDITIONAL VESSEL 12/19/2015  Greggory Keen, MD [facility]  . IR GENERIC HISTORICAL  12/19/2015   IR ANGIOGRAM PULMONARY BILATERAL SELECTIVE 12/19/2015 Greggory Keen, MD [facility]  . IR GENERIC HISTORICAL  12/19/2015   IR INFUSION THROMBOL ARTERIAL INITIAL (MS) 12/19/2015 Greggory Keen, MD [facility]  . IR GENERIC HISTORICAL  12/19/2015   IR US GUIDE VASC ACCESS RIGHT 12/19/2015 Greggory Keen, MD [facility]  . IR GENERIC HISTORICAL  12/19/2015   IR ANGIOGRAM SELECTIVE EACH ADDITIONAL VESSEL 12/19/2015 Greggory Keen, MD [facility]  . IR GENERIC HISTORICAL  12/20/2015   IR THROMB F/U EVAL ART/VEN FINAL DAY (MS) 12/20/2015 Corrie Mckusick, DO [facility]  . IR GENERIC HISTORICAL  12/19/2015   IR INFUSION THROMBOL ARTERIAL INITIAL (MS) 12/19/2015 Greggory Keen, MD [facility]  . PLANTAR FASCIA RELEASE     right  . Right knee replacement    . SPINAL FUSION  10/31/2015   revision at [facility]    . TOTAL KNEE ARTHROPLASTY Left 10/13/2017  . TOTAL KNEE ARTHROPLASTY Left 10/13/2017   Procedure: LEFT TOTAL KNEE ARTHROPLASTY;  Surgeon: Mcarthur Rossetti, MD;  Location: [facility];  Service: Orthopedics;  Laterality: Left;  . ULNAR TUNNEL RELEASE     right  . VENOUS ABLATION

## 2018-01-05 ENCOUNTER — Ambulatory Visit: Payer: No Typology Code available for payment source | Admitting: Physical Therapy

## 2018-01-07 ENCOUNTER — Ambulatory Visit: Payer: No Typology Code available for payment source | Admitting: Physical Therapy

## 2018-01-07 ENCOUNTER — Encounter: Payer: Self-pay | Admitting: Physical Therapy

## 2018-01-07 DIAGNOSIS — R2689 Other abnormalities of gait and mobility: Secondary | ICD-10-CM

## 2018-01-07 DIAGNOSIS — G8929 Other chronic pain: Secondary | ICD-10-CM

## 2018-01-07 DIAGNOSIS — M25562 Pain in left knee: Secondary | ICD-10-CM

## 2018-01-07 DIAGNOSIS — M25662 Stiffness of left knee, not elsewhere classified: Secondary | ICD-10-CM | POA: Diagnosis not present

## 2018-01-07 NOTE — Therapy (Signed)
Unilateral primary osteoarthritis, left knee 05/20/2017  . Morbid obesity ([place]) 04/30/2017  . Adnexal mass 02/17/2017  . Family history of ovarian cancer 02/17/2017  . IBS (irritable bowel syndrome) 01/21/2017  . Dyspnea 10/08/2016  . Paroxysmal atrial fibrillation ([place]) 06/18/2016  . Type 2 diabetes mellitus without complication, without long-term current use of insulin ([place]) 01/21/2016  . H/O Spinal surgery 12/21/2015  . Barrett's esophagus 12/21/2015  . Hx of adenomatous polyp of colon 03/01/2002   [name] PT, DPT, LAT, ATC  01/07/18  12:41 PM      [facility] [address] [place], [place], [address] Phone: [phone]   Fax:  [phone]  Name: [name] MRN: [id number] Date of Birth: 08/08/1950  [place], [place], [address] Phone: [phone]   Fax:  [phone]  Physical Therapy Treatment / Re-certification  Patient Details  Name: [name] MRN: [id number] Date of Birth: 1950-08-18 Referring Provider: Pete Pelt, PA-C   Encounter Date: 01/07/2018  PT End of Session - 01/07/18 1147    Visit Number  11    Number of Visits  23    Date for PT Re-Evaluation  02/18/18    Authorization Type  medicare: KX md by 15th visit, progress note by 20th visit    PT Start Time  1147    PT Stop Time  1233    PT Time Calculation (min)  46 min    Activity Tolerance  Patient tolerated treatment well    Behavior During Therapy  [facility] for tasks assessed/performed       Past Medical History:  Diagnosis Date  . Abnormal thyroid function test   . Allergy   . Anemia    she attributes previously to fibroids, she declines  . Barrett's esophagus   . Blood transfusion without reported diagnosis   . DDD (degenerative disc disease), cervical   . DJD (degenerative joint disease)   . GERD (gastroesophageal reflux disease)   . Heart murmur   . History of hiatal hernia   . Hx of adenomatous polyp of colon 03/01/2002  . OA (osteoarthritis)   . Obese   . Paroxysmal atrial fibrillation (HCC)   . PE (pulmonary embolism) 12/21/2015  . Plantar fasciitis   . PONV (postoperative nausea and vomiting)    pt. reports that she woke up during surgery  . Pre-diabetes     Past Surgical History:  Procedure Laterality Date  . ABDOMINAL HYSTERECTOMY    . Arthroscopic surgery left knee    . BACK SURGERY     2005 ,  2017  . CESAREAN SECTION    . CHOLECYSTECTOMY    . COLONOSCOPY  2003, 2011   3 mm adenoma 2011  . ESOPHAGOGASTRODUODENOSCOPY  multiple since 2003  . IR GENERIC HISTORICAL  12/19/2015   IR ANGIOGRAM SELECTIVE EACH ADDITIONAL VESSEL 12/19/2015 Greggory Keen, MD [facility]  . IR GENERIC HISTORICAL   12/19/2015   IR ANGIOGRAM PULMONARY BILATERAL SELECTIVE 12/19/2015 Greggory Keen, MD [facility]  . IR GENERIC HISTORICAL  12/19/2015   IR INFUSION THROMBOL ARTERIAL INITIAL (MS) 12/19/2015 Greggory Keen, MD [facility]  . IR GENERIC HISTORICAL  12/19/2015   IR US GUIDE VASC ACCESS RIGHT 12/19/2015 Greggory Keen, MD [facility]  . IR GENERIC HISTORICAL  12/19/2015   IR ANGIOGRAM SELECTIVE EACH ADDITIONAL VESSEL 12/19/2015 Greggory Keen, MD [facility]  . IR GENERIC HISTORICAL  12/20/2015   IR THROMB F/U EVAL ART/VEN FINAL DAY (MS) 12/20/2015 Corrie Mckusick, DO [facility]  . IR GENERIC HISTORICAL  12/19/2015   IR INFUSION THROMBOL ARTERIAL INITIAL (MS) 12/19/2015 Greggory Keen, MD [facility]  . PLANTAR FASCIA RELEASE     right  . Right knee replacement    . SPINAL FUSION  10/31/2015   revision at [facility]    . TOTAL KNEE ARTHROPLASTY Left 10/13/2017  . TOTAL KNEE ARTHROPLASTY Left 10/13/2017   Procedure: LEFT TOTAL KNEE ARTHROPLASTY;  Surgeon: Mcarthur Rossetti, MD;  Location: [facility];  Service: Orthopedics;  Laterality: Left;  . ULNAR TUNNEL RELEASE     right  . VENOUS ABLATION     x 2    There  Unilateral primary osteoarthritis, left knee 05/20/2017  . Morbid obesity ([place]) 04/30/2017  . Adnexal mass 02/17/2017  . Family history of ovarian cancer 02/17/2017  . IBS (irritable bowel syndrome) 01/21/2017  . Dyspnea 10/08/2016  . Paroxysmal atrial fibrillation ([place]) 06/18/2016  . Type 2 diabetes mellitus without complication, without long-term current use of insulin ([place]) 01/21/2016  . H/O Spinal surgery 12/21/2015  . Barrett's esophagus 12/21/2015  . Hx of adenomatous polyp of colon 03/01/2002   [name] PT, DPT, LAT, ATC  01/07/18  12:41 PM      [facility] [address] [place], [place], [address] Phone: [phone]   Fax:  [phone]  Name: [name] MRN: [id number] Date of Birth: 08/08/1950  [place], [place], [address] Phone: [phone]   Fax:  [phone]  Physical Therapy Treatment / Re-certification  Patient Details  Name: [name] MRN: [id number] Date of Birth: 1950-08-18 Referring Provider: Pete Pelt, PA-C   Encounter Date: 01/07/2018  PT End of Session - 01/07/18 1147    Visit Number  11    Number of Visits  23    Date for PT Re-Evaluation  02/18/18    Authorization Type  medicare: KX md by 15th visit, progress note by 20th visit    PT Start Time  1147    PT Stop Time  1233    PT Time Calculation (min)  46 min    Activity Tolerance  Patient tolerated treatment well    Behavior During Therapy  [facility] for tasks assessed/performed       Past Medical History:  Diagnosis Date  . Abnormal thyroid function test   . Allergy   . Anemia    she attributes previously to fibroids, she declines  . Barrett's esophagus   . Blood transfusion without reported diagnosis   . DDD (degenerative disc disease), cervical   . DJD (degenerative joint disease)   . GERD (gastroesophageal reflux disease)   . Heart murmur   . History of hiatal hernia   . Hx of adenomatous polyp of colon 03/01/2002  . OA (osteoarthritis)   . Obese   . Paroxysmal atrial fibrillation (HCC)   . PE (pulmonary embolism) 12/21/2015  . Plantar fasciitis   . PONV (postoperative nausea and vomiting)    pt. reports that she woke up during surgery  . Pre-diabetes     Past Surgical History:  Procedure Laterality Date  . ABDOMINAL HYSTERECTOMY    . Arthroscopic surgery left knee    . BACK SURGERY     2005 ,  2017  . CESAREAN SECTION    . CHOLECYSTECTOMY    . COLONOSCOPY  2003, 2011   3 mm adenoma 2011  . ESOPHAGOGASTRODUODENOSCOPY  multiple since 2003  . IR GENERIC HISTORICAL  12/19/2015   IR ANGIOGRAM SELECTIVE EACH ADDITIONAL VESSEL 12/19/2015 Greggory Keen, MD [facility]  . IR GENERIC HISTORICAL   12/19/2015   IR ANGIOGRAM PULMONARY BILATERAL SELECTIVE 12/19/2015 Greggory Keen, MD [facility]  . IR GENERIC HISTORICAL  12/19/2015   IR INFUSION THROMBOL ARTERIAL INITIAL (MS) 12/19/2015 Greggory Keen, MD [facility]  . IR GENERIC HISTORICAL  12/19/2015   IR US GUIDE VASC ACCESS RIGHT 12/19/2015 Greggory Keen, MD [facility]  . IR GENERIC HISTORICAL  12/19/2015   IR ANGIOGRAM SELECTIVE EACH ADDITIONAL VESSEL 12/19/2015 Greggory Keen, MD [facility]  . IR GENERIC HISTORICAL  12/20/2015   IR THROMB F/U EVAL ART/VEN FINAL DAY (MS) 12/20/2015 Corrie Mckusick, DO [facility]  . IR GENERIC HISTORICAL  12/19/2015   IR INFUSION THROMBOL ARTERIAL INITIAL (MS) 12/19/2015 Greggory Keen, MD [facility]  . PLANTAR FASCIA RELEASE     right  . Right knee replacement    . SPINAL FUSION  10/31/2015   revision at [facility]    . TOTAL KNEE ARTHROPLASTY Left 10/13/2017  . TOTAL KNEE ARTHROPLASTY Left 10/13/2017   Procedure: LEFT TOTAL KNEE ARTHROPLASTY;  Surgeon: Mcarthur Rossetti, MD;  Location: [facility];  Service: Orthopedics;  Laterality: Left;  . ULNAR TUNNEL RELEASE     right  . VENOUS ABLATION     x 2    There

## 2018-01-08 ENCOUNTER — Ambulatory Visit (INDEPENDENT_AMBULATORY_CARE_PROVIDER_SITE_OTHER): Payer: No Typology Code available for payment source | Admitting: Family Medicine

## 2018-01-08 ENCOUNTER — Encounter (INDEPENDENT_AMBULATORY_CARE_PROVIDER_SITE_OTHER): Payer: Self-pay | Admitting: Family Medicine

## 2018-01-08 VITALS — BP 136/87 | HR 89 | Temp 97.7°F | Resp 18 | Ht 62.0 in | Wt 247.5 lb

## 2018-01-08 DIAGNOSIS — Z0000 Encounter for general adult medical examination without abnormal findings: Secondary | ICD-10-CM | POA: Diagnosis not present

## 2018-01-08 DIAGNOSIS — E119 Type 2 diabetes mellitus without complications: Secondary | ICD-10-CM

## 2018-01-08 DIAGNOSIS — R1013 Epigastric pain: Secondary | ICD-10-CM

## 2018-01-08 DIAGNOSIS — Z86010 Personal history of colonic polyps: Secondary | ICD-10-CM

## 2018-01-08 DIAGNOSIS — K2270 Barrett's esophagus without dysplasia: Secondary | ICD-10-CM

## 2018-01-08 DIAGNOSIS — Z86711 Personal history of pulmonary embolism: Secondary | ICD-10-CM

## 2018-01-08 DIAGNOSIS — Z8041 Family history of malignant neoplasm of ovary: Secondary | ICD-10-CM

## 2018-01-08 DIAGNOSIS — I480 Paroxysmal atrial fibrillation: Secondary | ICD-10-CM | POA: Diagnosis not present

## 2018-01-08 DIAGNOSIS — E559 Vitamin D deficiency, unspecified: Secondary | ICD-10-CM | POA: Insufficient documentation

## 2018-01-08 DIAGNOSIS — Z8349 Family history of other endocrine, nutritional and metabolic diseases: Secondary | ICD-10-CM

## 2018-01-08 DIAGNOSIS — K580 Irritable bowel syndrome with diarrhea: Secondary | ICD-10-CM

## 2018-01-08 DIAGNOSIS — E538 Deficiency of other specified B group vitamins: Secondary | ICD-10-CM

## 2018-01-08 NOTE — Progress Notes (Signed)
Office Visit Note   Patient: [name]           Date of Birth: 1950-12-04           MRN: [id number] Visit Date: 01/08/2018 Requested by: [name], NP [address] [place], [place] [address] PCP: Lavada Mesi, MD  Subjective: Chief Complaint  Patient presents with  . Annual Exam    HPI: She is here for a wellness exam.  She has not had one in a couple years.  She has been to multiple specialists, however.  Her main concern today is ongoing abdominal pain.  She has epigastric pain almost constantly, with multiple episodes of cramping and diarrhea throughout the day.  She had a CT scan last October which was unrevealing for causes of her pain.  She has not noticed any food associations.  She had upper endoscopy showing stable Barrett's esophagus.  She takes Protonix chronically.  Diabetes has been well controlled with A1c of 6.2 she is not on any medications for it.  She has a history of pulmonary embolus in 2017.  She is recovered from that.  She had a couple episodes of paroxysmal atrial fibrillation.  She does not need medication on a regular basis for this.  She has vitamin B12 deficiency but is not on anything for this.  She takes vitamin D 1000 IU daily for her deficiency.  She has morbid obesity chronically.               ROS: All other systems were negative.  Health Maintenance: Colonoscopy: 2011 Immunizations: Up-to-date per patient report Pap/Mammogram: A couple years ago Dentist: Yearly Eye: About every 6 months   Objective: Vital Signs: BP 136/87 (BP Location: Left Arm, Patient Position: Sitting, Cuff Size: Large)   Pulse 89   Temp 97.7 F (36.5 C)   Resp 18   Ht 5\' 2"  (1.575 m)   Wt 247 lb 8 oz (112.3 kg)   SpO2 98%   BMI 45.27 kg/m   Physical Exam:  HEENT:  [place]/AT, PERRLA, EOM Full, no nystagmus.  Funduscopic examination within normal limits.  No conjunctival erythema.  Tympanic membranes are pearly gray with normal landmarks.   External ear canals are normal.  Nasal passages are clear.  Oropharynx is clear.  No significant lymphadenopathy.  No thyromegaly or nodules.  2+ carotid pulses without bruits. CV: Regular rate and rhythm without murmurs, rubs, or gallops.  No peripheral edema.  2+ radial and posterior tibial pulses. Lungs: Clear to auscultation throughout with no wheezing or areas of consolidation. Abd: Bowel sounds are active, no hepatosplenomegaly or masses.  Soft but tender in the epigastric area.  I do not feel a definite hernia, but she might have a symptomatic trigger point to the right of midline.  No audible bruits.  No evidence of ascites. EXT: 1+ DTRs.  No nail deformities.   Imaging: None today.  Assessment & Plan: 1.  Wellness examination -Labs to monitor.  Follow-up in 1 year.  Mammogram ordered.  2.  Epigastric abdominal pain with IBS -Trial of food elimination physical therapy with myofascial release techniques if symptoms persist.  3.  Diabetes, has been controlled -A1c today.  4.  History of pulmonary embolus  5.  B12 deficiency -Recheck levels today, start treatment if indicated.  6.  Vitamin D deficiency -Check levels today, target would be 50-80.     Follow-Up Instructions: No follow-ups on file.     Procedures: None today.  PMFS History: Patient Active Problem List   Diagnosis Date Noted  . B12 deficiency 01/08/2018  . Vitamin D deficiency 01/08/2018  . History of pulmonary embolism 01/08/2018  . Status post total left knee replacement 10/13/2017  . Family history of thyroid disease 08/25/2017  . Chronic pain of left knee 05/20/2017  . Unilateral primary osteoarthritis, left knee 05/20/2017  . Morbid obesity (HCC) 04/30/2017  . Adnexal mass 02/17/2017  . Family history of ovarian cancer 02/17/2017  . IBS (irritable bowel syndrome) 01/21/2017  . Paroxysmal atrial fibrillation (HCC) 06/18/2016  . Type 2 diabetes mellitus without complication, without long-term  current use of insulin ([facility]) 01/21/2016  . H/O Spinal surgery 12/21/2015  . Barrett's esophagus 12/21/2015  . Hx of adenomatous polyp of colon 03/01/2002   Past Medical History:  Diagnosis Date  . Abnormal thyroid function test   . Allergy   . Anemia    she attributes previously to fibroids, she declines  . Barrett's esophagus   . Blood transfusion without reported diagnosis   . DDD (degenerative disc disease), cervical   . DJD (degenerative joint disease)   . GERD (gastroesophageal reflux disease)   . Heart murmur   . History of hiatal hernia   . Hx of adenomatous polyp of colon 03/01/2002  . OA (osteoarthritis)   . Obese   . Paroxysmal atrial fibrillation (HCC)   . PE (pulmonary embolism) 12/21/2015  . Plantar fasciitis   . PONV (postoperative nausea and vomiting)    pt. reports that she woke up during surgery  . Pre-diabetes     Family History  Problem Relation Age of Onset  . Stroke Maternal Grandmother   . Bladder Cancer Maternal Grandmother 50  . Esophageal cancer Maternal Grandmother 50  . Hyperthyroidism Mother        Radioactive Iodine Treatment  . Hypothyroidism Mother   . Cervical cancer Maternal Aunt 50  . Ovarian cancer Daughter 34  . Colon cancer Neg Hx   . Pancreatic cancer Neg Hx   . Rectal cancer Neg Hx   . Stomach cancer Neg Hx   . Diabetes Neg Hx     Past Surgical History:  Procedure Laterality Date  . ABDOMINAL HYSTERECTOMY    . Arthroscopic surgery left knee    . BACK SURGERY     2005 ,  2017  . CESAREAN SECTION    . CHOLECYSTECTOMY    . COLONOSCOPY  2003, 2011   3 mm adenoma 2011  . ESOPHAGOGASTRODUODENOSCOPY  multiple since 2003  . IR GENERIC HISTORICAL  12/19/2015   IR ANGIOGRAM SELECTIVE EACH ADDITIONAL VESSEL 12/19/2015 [name], MD [facility]  . IR GENERIC HISTORICAL  12/19/2015   IR ANGIOGRAM PULMONARY BILATERAL SELECTIVE 12/19/2015 [name], MD [facility]  . IR GENERIC HISTORICAL  12/19/2015   IR INFUSION THROMBOL  ARTERIAL INITIAL (MS) 12/19/2015 [name], MD [facility]  . IR GENERIC HISTORICAL  12/19/2015   IR US GUIDE VASC ACCESS RIGHT 12/19/2015 [name], MD [facility]  . IR GENERIC HISTORICAL  12/19/2015   IR ANGIOGRAM SELECTIVE EACH ADDITIONAL VESSEL 12/19/2015 [name], MD [facility]  . IR GENERIC HISTORICAL  12/20/2015   IR THROMB F/U EVAL ART/VEN FINAL DAY (MS) 12/20/2015 Gilmer Mor, DO [facility]  . IR GENERIC HISTORICAL  12/19/2015   IR INFUSION THROMBOL ARTERIAL INITIAL (MS) 12/19/2015 [name], MD [facility]  . PLANTAR FASCIA RELEASE     right  . Right knee replacement    .  SPINAL FUSION  10/31/2015   revision at [facility]   . TONSILLECTOMY    . TOTAL KNEE ARTHROPLASTY Left 10/13/2017  . TOTAL KNEE ARTHROPLASTY Left 10/13/2017   Procedure: LEFT TOTAL KNEE ARTHROPLASTY;  Surgeon: Kathryne Hitch, MD;  [facility];  Service: Orthopedics;  Laterality: Left;  . ULNAR TUNNEL RELEASE     right  . VENOUS ABLATION     x 2   Social History   Occupational History    Comment: Telephone sales  Tobacco Use  . Smoking status: Never Smoker  . Smokeless tobacco: Never Used  Substance and Sexual Activity  . Alcohol use: No  . Drug use: No  . Sexual activity: Not Currently    Partners: Male    Birth control/protection: Surgical    Comment: Hysterectomy

## 2018-01-09 LAB — CBC WITH DIFFERENTIAL/PLATELET
Basophils Absolute: 63 cells/uL (ref 0–200)
Basophils Relative: 1.1 %
Eosinophils Absolute: 182 cells/uL (ref 15–500)
Eosinophils Relative: 3.2 %
HCT: 41.4 % (ref 35.0–45.0)
Hemoglobin: 13.3 g/dL (ref 11.7–15.5)
Lymphs Abs: 1414 cells/uL (ref 850–3900)
MCH: 26.1 pg — ABNORMAL LOW (ref 27.0–33.0)
MCHC: 32.1 g/dL (ref 32.0–36.0)
MCV: 81.3 fL (ref 80.0–100.0)
MPV: 10.2 fL (ref 7.5–12.5)
Monocytes Relative: 7.1 %
Neutro Abs: 3637 cells/uL (ref 1500–7800)
Neutrophils Relative %: 63.8 %
Platelets: 257 10*3/uL (ref 140–400)
RBC: 5.09 10*6/uL (ref 3.80–5.10)
RDW: 13.1 % (ref 11.0–15.0)
Total Lymphocyte: 24.8 %
WBC mixed population: 405 cells/uL (ref 200–950)
WBC: 5.7 10*3/uL (ref 3.8–10.8)

## 2018-01-09 LAB — COMPREHENSIVE METABOLIC PANEL
AG Ratio: 1.5 (calc) (ref 1.0–2.5)
ALT: 12 U/L (ref 6–29)
AST: 17 U/L (ref 10–35)
Albumin: 3.9 g/dL (ref 3.6–5.1)
Alkaline phosphatase (APISO): 67 U/L (ref 33–130)
BUN: 14 mg/dL (ref 7–25)
CO2: 29 mmol/L (ref 20–32)
Calcium: 9.6 mg/dL (ref 8.6–10.4)
Chloride: 105 mmol/L (ref 98–110)
Creat: 0.79 mg/dL (ref 0.50–0.99)
Globulin: 2.6 g/dL (calc) (ref 1.9–3.7)
Glucose, Bld: 139 mg/dL — ABNORMAL HIGH (ref 65–99)
Potassium: 4.5 mmol/L (ref 3.5–5.3)
Sodium: 141 mmol/L (ref 135–146)
Total Bilirubin: 0.9 mg/dL (ref 0.2–1.2)
Total Protein: 6.5 g/dL (ref 6.1–8.1)

## 2018-01-09 LAB — THYROID PANEL WITH TSH
Free Thyroxine Index: 1.9 (ref 1.4–3.8)
T3 Uptake: 25 % (ref 22–35)
T4, Total: 7.6 ug/dL (ref 5.1–11.9)
TSH: 2.01 mIU/L (ref 0.40–4.50)

## 2018-01-09 LAB — LIPID PANEL
Cholesterol: 194 mg/dL (ref <200)
HDL: 50 mg/dL — ABNORMAL LOW (ref >50)
LDL Cholesterol (Calc): 123 mg/dL (calc) — ABNORMAL HIGH
Non-HDL Cholesterol (Calc): 144 mg/dL (calc) — ABNORMAL HIGH (ref <130)
Total CHOL/HDL Ratio: 3.9 (calc) (ref <5.0)
Triglycerides: 103 mg/dL (ref <150)

## 2018-01-09 LAB — HIGH SENSITIVITY CRP: hs-CRP: 4.3 mg/L — ABNORMAL HIGH

## 2018-01-09 LAB — HEMOGLOBIN A1C
Hgb A1c MFr Bld: 6.8 % of total Hgb — ABNORMAL HIGH (ref <5.7)
Mean Plasma Glucose: 148 (calc)
eAG (mmol/L): 8.2 (calc)

## 2018-01-09 LAB — VITAMIN B12: Vitamin B-12: 238 pg/mL (ref 200–1100)

## 2018-01-09 LAB — VITAMIN D 25 HYDROXY (VIT D DEFICIENCY, FRACTURES): Vit D, 25-Hydroxy: 20 ng/mL — ABNORMAL LOW (ref 30–100)

## 2018-01-10 ENCOUNTER — Encounter (INDEPENDENT_AMBULATORY_CARE_PROVIDER_SITE_OTHER): Payer: Self-pay | Admitting: Family Medicine

## 2018-01-10 ENCOUNTER — Telehealth (INDEPENDENT_AMBULATORY_CARE_PROVIDER_SITE_OTHER): Payer: Self-pay | Admitting: Family Medicine

## 2018-01-10 DIAGNOSIS — R1013 Epigastric pain: Secondary | ICD-10-CM

## 2018-01-10 NOTE — Telephone Encounter (Signed)
A1C has risen to 6.8.  It is very important to try to exercise when able, and to minimize intake of processed carbohydrates (breads; pastas; cereals; sugars/sweets).  Recheck in 4-6 months.  Vitamin D is low.  Target level is 50-80.  I recommend taking vitamin D3 at 5,000 IU daily long-term.  Recheck in 6 months.  B12 level is low, possibly due to protonix use.  I recommend over the counter B12 at 1,000 mcg daily.  Recheck in 3-6 months.  If not improving, could try injections.  CRP and lipids should improve with dietary changes suggested above.   Other labs look good.  [place]

## 2018-01-12 ENCOUNTER — Ambulatory Visit: Payer: No Typology Code available for payment source | Admitting: Physical Therapy

## 2018-01-12 ENCOUNTER — Encounter (INDEPENDENT_AMBULATORY_CARE_PROVIDER_SITE_OTHER): Payer: Self-pay

## 2018-01-12 ENCOUNTER — Other Ambulatory Visit (INDEPENDENT_AMBULATORY_CARE_PROVIDER_SITE_OTHER): Payer: Self-pay

## 2018-01-12 ENCOUNTER — Ambulatory Visit (INDEPENDENT_AMBULATORY_CARE_PROVIDER_SITE_OTHER): Payer: No Typology Code available for payment source | Admitting: Family Medicine

## 2018-01-12 ENCOUNTER — Encounter: Payer: Self-pay | Admitting: Physical Therapy

## 2018-01-12 DIAGNOSIS — M25562 Pain in left knee: Secondary | ICD-10-CM

## 2018-01-12 DIAGNOSIS — R2689 Other abnormalities of gait and mobility: Secondary | ICD-10-CM

## 2018-01-12 DIAGNOSIS — M25662 Stiffness of left knee, not elsewhere classified: Secondary | ICD-10-CM | POA: Diagnosis not present

## 2018-01-12 DIAGNOSIS — G8929 Other chronic pain: Secondary | ICD-10-CM

## 2018-01-12 DIAGNOSIS — R1013 Epigastric pain: Secondary | ICD-10-CM

## 2018-01-12 NOTE — Progress Notes (Signed)
Patient came into office today for lipase blood work draw.

## 2018-01-12 NOTE — Therapy (Signed)
[place], [place], [address] Phone: [phone]   Fax:  [phone]  Physical Therapy Treatment  Patient Details  Name: [name] MRN: [id number] Date of Birth: November 19, 1950 Referring Provider: Pete Pelt, PA-C   Encounter Date: 01/12/2018  PT End of Session - 01/12/18 1759    Visit Number  12    Number of Visits  23    Date for PT Re-Evaluation  02/18/18    PT Start Time  9379   short session due to patient arrived late .  Notification  of arrival not announced.   PT Stop Time  1417    PT Time Calculation (min)  35 min    Activity Tolerance  Patient tolerated treatment well    Behavior During Therapy  WFL for tasks assessed/performed       Past Medical History:  Diagnosis Date  . Abnormal thyroid function test   . Allergy   . Anemia    she attributes previously to fibroids, she declines  . Barrett's esophagus   . Blood transfusion without reported diagnosis   . DDD (degenerative disc disease), cervical   . DJD (degenerative joint disease)   . GERD (gastroesophageal reflux disease)   . Heart murmur   . History of hiatal hernia   . Hx of adenomatous polyp of colon 03/01/2002  . OA (osteoarthritis)   . Obese   . Paroxysmal atrial fibrillation (HCC)   . PE (pulmonary embolism) 12/21/2015  . Plantar fasciitis   . PONV (postoperative nausea and vomiting)    pt. reports that she woke up during surgery  . Pre-diabetes     Past Surgical History:  Procedure Laterality Date  . ABDOMINAL HYSTERECTOMY    . Arthroscopic surgery left knee    . BACK SURGERY     2005 ,  2017  . CESAREAN SECTION    . CHOLECYSTECTOMY    . COLONOSCOPY  2003, 2011   3 mm adenoma 2011  . ESOPHAGOGASTRODUODENOSCOPY  multiple since 2003  . IR GENERIC HISTORICAL  12/19/2015   IR ANGIOGRAM SELECTIVE EACH ADDITIONAL VESSEL 12/19/2015 Greggory Keen, MD [facility]  . IR GENERIC HISTORICAL  12/19/2015   IR  ANGIOGRAM PULMONARY BILATERAL SELECTIVE 12/19/2015 Greggory Keen, MD [facility]  . IR GENERIC HISTORICAL  12/19/2015   IR INFUSION THROMBOL ARTERIAL INITIAL (MS) 12/19/2015 Greggory Keen, MD [facility]  . IR GENERIC HISTORICAL  12/19/2015   IR US GUIDE VASC ACCESS RIGHT 12/19/2015 Greggory Keen, MD [facility]  . IR GENERIC HISTORICAL  12/19/2015   IR ANGIOGRAM SELECTIVE EACH ADDITIONAL VESSEL 12/19/2015 Greggory Keen, MD [facility]  . IR GENERIC HISTORICAL  12/20/2015   IR THROMB F/U EVAL ART/VEN FINAL DAY (MS) 12/20/2015 Corrie Mckusick, DO [facility]  . IR GENERIC HISTORICAL  12/19/2015   IR INFUSION THROMBOL ARTERIAL INITIAL (MS) 12/19/2015 Greggory Keen, MD [facility]  . PLANTAR FASCIA RELEASE     right  . Right knee replacement    . SPINAL FUSION  10/31/2015   revision at [place]    . TOTAL KNEE ARTHROPLASTY Left 10/13/2017  . TOTAL KNEE ARTHROPLASTY Left 10/13/2017   Procedure: LEFT TOTAL KNEE ARTHROPLASTY;  Surgeon: Mcarthur Rossetti, MD;  Location: [place];  Service: Orthopedics;  Laterality: Left;  . ULNAR TUNNEL RELEASE     right  . VENOUS ABLATION     x 2    There were no vitals  [place], [place], [address] Phone: [phone]   Fax:  [phone]  Physical Therapy Treatment  Patient Details  Name: [name] MRN: [id number] Date of Birth: November 19, 1950 Referring Provider: Pete Pelt, PA-C   Encounter Date: 01/12/2018  PT End of Session - 01/12/18 1759    Visit Number  12    Number of Visits  23    Date for PT Re-Evaluation  02/18/18    PT Start Time  9379   short session due to patient arrived late .  Notification  of arrival not announced.   PT Stop Time  1417    PT Time Calculation (min)  35 min    Activity Tolerance  Patient tolerated treatment well    Behavior During Therapy  WFL for tasks assessed/performed       Past Medical History:  Diagnosis Date  . Abnormal thyroid function test   . Allergy   . Anemia    she attributes previously to fibroids, she declines  . Barrett's esophagus   . Blood transfusion without reported diagnosis   . DDD (degenerative disc disease), cervical   . DJD (degenerative joint disease)   . GERD (gastroesophageal reflux disease)   . Heart murmur   . History of hiatal hernia   . Hx of adenomatous polyp of colon 03/01/2002  . OA (osteoarthritis)   . Obese   . Paroxysmal atrial fibrillation (HCC)   . PE (pulmonary embolism) 12/21/2015  . Plantar fasciitis   . PONV (postoperative nausea and vomiting)    pt. reports that she woke up during surgery  . Pre-diabetes     Past Surgical History:  Procedure Laterality Date  . ABDOMINAL HYSTERECTOMY    . Arthroscopic surgery left knee    . BACK SURGERY     2005 ,  2017  . CESAREAN SECTION    . CHOLECYSTECTOMY    . COLONOSCOPY  2003, 2011   3 mm adenoma 2011  . ESOPHAGOGASTRODUODENOSCOPY  multiple since 2003  . IR GENERIC HISTORICAL  12/19/2015   IR ANGIOGRAM SELECTIVE EACH ADDITIONAL VESSEL 12/19/2015 Greggory Keen, MD [facility]  . IR GENERIC HISTORICAL  12/19/2015   IR  ANGIOGRAM PULMONARY BILATERAL SELECTIVE 12/19/2015 Greggory Keen, MD [facility]  . IR GENERIC HISTORICAL  12/19/2015   IR INFUSION THROMBOL ARTERIAL INITIAL (MS) 12/19/2015 Greggory Keen, MD [facility]  . IR GENERIC HISTORICAL  12/19/2015   IR US GUIDE VASC ACCESS RIGHT 12/19/2015 Greggory Keen, MD [facility]  . IR GENERIC HISTORICAL  12/19/2015   IR ANGIOGRAM SELECTIVE EACH ADDITIONAL VESSEL 12/19/2015 Greggory Keen, MD [facility]  . IR GENERIC HISTORICAL  12/20/2015   IR THROMB F/U EVAL ART/VEN FINAL DAY (MS) 12/20/2015 Corrie Mckusick, DO [facility]  . IR GENERIC HISTORICAL  12/19/2015   IR INFUSION THROMBOL ARTERIAL INITIAL (MS) 12/19/2015 Greggory Keen, MD [facility]  . PLANTAR FASCIA RELEASE     right  . Right knee replacement    . SPINAL FUSION  10/31/2015   revision at [place]    . TOTAL KNEE ARTHROPLASTY Left 10/13/2017  . TOTAL KNEE ARTHROPLASTY Left 10/13/2017   Procedure: LEFT TOTAL KNEE ARTHROPLASTY;  Surgeon: Mcarthur Rossetti, MD;  Location: [facility];  Service: Orthopedics;  Laterality: Left;  . ULNAR TUNNEL RELEASE     right  . VENOUS ABLATION     x 2    There were no vitals  [place], [place], [address] Phone: [phone]   Fax:  [phone]  Physical Therapy Treatment  Patient Details  Name: [name] MRN: [id number] Date of Birth: November 19, 1950 Referring Provider: Pete Pelt, PA-C   Encounter Date: 01/12/2018  PT End of Session - 01/12/18 1759    Visit Number  12    Number of Visits  23    Date for PT Re-Evaluation  02/18/18    PT Start Time  9379   short session due to patient arrived late .  Notification  of arrival not announced.   PT Stop Time  1417    PT Time Calculation (min)  35 min    Activity Tolerance  Patient tolerated treatment well    Behavior During Therapy  WFL for tasks assessed/performed       Past Medical History:  Diagnosis Date  . Abnormal thyroid function test   . Allergy   . Anemia    she attributes previously to fibroids, she declines  . Barrett's esophagus   . Blood transfusion without reported diagnosis   . DDD (degenerative disc disease), cervical   . DJD (degenerative joint disease)   . GERD (gastroesophageal reflux disease)   . Heart murmur   . History of hiatal hernia   . Hx of adenomatous polyp of colon 03/01/2002  . OA (osteoarthritis)   . Obese   . Paroxysmal atrial fibrillation (HCC)   . PE (pulmonary embolism) 12/21/2015  . Plantar fasciitis   . PONV (postoperative nausea and vomiting)    pt. reports that she woke up during surgery  . Pre-diabetes     Past Surgical History:  Procedure Laterality Date  . ABDOMINAL HYSTERECTOMY    . Arthroscopic surgery left knee    . BACK SURGERY     2005 ,  2017  . CESAREAN SECTION    . CHOLECYSTECTOMY    . COLONOSCOPY  2003, 2011   3 mm adenoma 2011  . ESOPHAGOGASTRODUODENOSCOPY  multiple since 2003  . IR GENERIC HISTORICAL  12/19/2015   IR ANGIOGRAM SELECTIVE EACH ADDITIONAL VESSEL 12/19/2015 Greggory Keen, MD [facility]  . IR GENERIC HISTORICAL  12/19/2015   IR  ANGIOGRAM PULMONARY BILATERAL SELECTIVE 12/19/2015 Greggory Keen, MD [facility]  . IR GENERIC HISTORICAL  12/19/2015   IR INFUSION THROMBOL ARTERIAL INITIAL (MS) 12/19/2015 Greggory Keen, MD [facility]  . IR GENERIC HISTORICAL  12/19/2015   IR US GUIDE VASC ACCESS RIGHT 12/19/2015 Greggory Keen, MD [facility]  . IR GENERIC HISTORICAL  12/19/2015   IR ANGIOGRAM SELECTIVE EACH ADDITIONAL VESSEL 12/19/2015 Greggory Keen, MD [facility]  . IR GENERIC HISTORICAL  12/20/2015   IR THROMB F/U EVAL ART/VEN FINAL DAY (MS) 12/20/2015 Corrie Mckusick, DO [facility]  . IR GENERIC HISTORICAL  12/19/2015   IR INFUSION THROMBOL ARTERIAL INITIAL (MS) 12/19/2015 Greggory Keen, MD [facility]  . PLANTAR FASCIA RELEASE     right  . Right knee replacement    . SPINAL FUSION  10/31/2015   revision at [place]    . TOTAL KNEE ARTHROPLASTY Left 10/13/2017  . TOTAL KNEE ARTHROPLASTY Left 10/13/2017   Procedure: LEFT TOTAL KNEE ARTHROPLASTY;  Surgeon: Mcarthur Rossetti, MD;  Location: [facility];  Service: Orthopedics;  Laterality: Left;  . ULNAR TUNNEL RELEASE     right  . VENOUS ABLATION     x 2    There were no vitals  fibrillation ([place]) 06/18/2016  . Type 2 diabetes mellitus without complication, without long-term current use of insulin ([place]) 01/21/2016  . H/O Spinal surgery 12/21/2015  . Barrett's esophagus 12/21/2015  . Hx of adenomatous polyp of colon 03/01/2002    Voncile Schwarz PTA 01/12/2018, 6:09 PM  [facility] [address] [place], [place], [address] Phone: [phone]   Fax:  [phone]  Name: [name] MRN: [id number] Date of Birth: [birth year]

## 2018-01-13 ENCOUNTER — Encounter: Payer: Self-pay | Admitting: Internal Medicine

## 2018-01-13 ENCOUNTER — Telehealth (INDEPENDENT_AMBULATORY_CARE_PROVIDER_SITE_OTHER): Payer: Self-pay | Admitting: Family Medicine

## 2018-01-13 LAB — LIPASE: Lipase: 22 U/L (ref 7–60)

## 2018-01-13 NOTE — Telephone Encounter (Signed)
Lipase is normal.  Along with previously normal CT scan, pancreatitis would be highly unlikely.

## 2018-01-14 ENCOUNTER — Ambulatory Visit: Payer: No Typology Code available for payment source | Admitting: Physical Therapy

## 2018-01-19 ENCOUNTER — Ambulatory Visit: Payer: No Typology Code available for payment source | Attending: Orthopaedic Surgery | Admitting: Physical Therapy

## 2018-01-19 ENCOUNTER — Encounter: Payer: Self-pay | Admitting: Physical Therapy

## 2018-01-19 DIAGNOSIS — G8929 Other chronic pain: Secondary | ICD-10-CM | POA: Insufficient documentation

## 2018-01-19 DIAGNOSIS — M25662 Stiffness of left knee, not elsewhere classified: Secondary | ICD-10-CM

## 2018-01-19 DIAGNOSIS — M25562 Pain in left knee: Secondary | ICD-10-CM | POA: Diagnosis present

## 2018-01-19 DIAGNOSIS — R2689 Other abnormalities of gait and mobility: Secondary | ICD-10-CM | POA: Diagnosis present

## 2018-01-19 NOTE — Therapy (Signed)
[place], [place], [address] Phone: [phone]   Fax:  [phone]  Physical Therapy Treatment  Patient Details  Name: [name] MRN: [id number] Date of Birth: Jan 01, 1951 Referring Provider (PT): Pete Pelt, [place]   Encounter Date: 01/19/2018  PT End of Session - 01/19/18 1818    Visit Number  13    Number of Visits  23    Date for PT Re-Evaluation  02/18/18    Authorization Type  medicare: KX md by 15th visit, progress note by 20th visit    PT Start Time  1332    PT Stop Time  1415    PT Time Calculation (min)  43 min    Activity Tolerance  Patient tolerated treatment well    Behavior During Therapy  [facility] for tasks assessed/performed       Past Medical History:  Diagnosis Date  . Abnormal thyroid function test   . Allergy   . Anemia    she attributes previously to fibroids, she declines  . Barrett's esophagus   . Blood transfusion without reported diagnosis   . DDD (degenerative disc disease), cervical   . DJD (degenerative joint disease)   . GERD (gastroesophageal reflux disease)   . Heart murmur   . History of hiatal hernia   . Hx of adenomatous polyp of colon 03/01/2002  . OA (osteoarthritis)   . Obese   . Paroxysmal atrial fibrillation (HCC)   . PE (pulmonary embolism) 12/21/2015  . Plantar fasciitis   . PONV (postoperative nausea and vomiting)    pt. reports that she woke up during surgery  . Pre-diabetes     Past Surgical History:  Procedure Laterality Date  . ABDOMINAL HYSTERECTOMY    . Arthroscopic surgery left knee    . BACK SURGERY     2005 ,  2017  . CESAREAN SECTION    . CHOLECYSTECTOMY    . COLONOSCOPY  2003, 2011   3 mm adenoma 2011  . ESOPHAGOGASTRODUODENOSCOPY  multiple since 2003  . IR GENERIC HISTORICAL  12/19/2015   IR ANGIOGRAM SELECTIVE EACH ADDITIONAL VESSEL 12/19/2015 Greggory Keen, MD [facility]  . IR GENERIC HISTORICAL  12/19/2015   IR  ANGIOGRAM PULMONARY BILATERAL SELECTIVE 12/19/2015 Greggory Keen, MD [facility]  . IR GENERIC HISTORICAL  12/19/2015   IR INFUSION THROMBOL ARTERIAL INITIAL (MS) 12/19/2015 Greggory Keen, MD [facility]  . IR GENERIC HISTORICAL  12/19/2015   IR US GUIDE VASC ACCESS RIGHT 12/19/2015 Greggory Keen, MD [facility]  . IR GENERIC HISTORICAL  12/19/2015   IR ANGIOGRAM SELECTIVE EACH ADDITIONAL VESSEL 12/19/2015 Greggory Keen, MD [facility]  . IR GENERIC HISTORICAL  12/20/2015   IR THROMB F/U EVAL ART/VEN FINAL DAY (MS) 12/20/2015 Corrie Mckusick, DO [facility]  . IR GENERIC HISTORICAL  12/19/2015   IR INFUSION THROMBOL ARTERIAL INITIAL (MS) 12/19/2015 Greggory Keen, MD [facility]  . PLANTAR FASCIA RELEASE     right  . Right knee replacement    . SPINAL FUSION  10/31/2015   revision at [place]    . TOTAL KNEE ARTHROPLASTY Left 10/13/2017  . TOTAL KNEE ARTHROPLASTY Left 10/13/2017   Procedure: LEFT TOTAL KNEE ARTHROPLASTY;  Surgeon: Mcarthur Rossetti, MD;  Location: [place];  Service: Orthopedics;  Laterality: Left;  . ULNAR TUNNEL RELEASE     right  . VENOUS ABLATION     x 2    There were  [place], [place], [address] Phone: [phone]   Fax:  [phone]  Physical Therapy Treatment  Patient Details  Name: [name] MRN: [id number] Date of Birth: Jan 01, 1951 Referring Provider (PT): Pete Pelt, [place]   Encounter Date: 01/19/2018  PT End of Session - 01/19/18 1818    Visit Number  13    Number of Visits  23    Date for PT Re-Evaluation  02/18/18    Authorization Type  medicare: KX md by 15th visit, progress note by 20th visit    PT Start Time  1332    PT Stop Time  1415    PT Time Calculation (min)  43 min    Activity Tolerance  Patient tolerated treatment well    Behavior During Therapy  [facility] for tasks assessed/performed       Past Medical History:  Diagnosis Date  . Abnormal thyroid function test   . Allergy   . Anemia    she attributes previously to fibroids, she declines  . Barrett's esophagus   . Blood transfusion without reported diagnosis   . DDD (degenerative disc disease), cervical   . DJD (degenerative joint disease)   . GERD (gastroesophageal reflux disease)   . Heart murmur   . History of hiatal hernia   . Hx of adenomatous polyp of colon 03/01/2002  . OA (osteoarthritis)   . Obese   . Paroxysmal atrial fibrillation (HCC)   . PE (pulmonary embolism) 12/21/2015  . Plantar fasciitis   . PONV (postoperative nausea and vomiting)    pt. reports that she woke up during surgery  . Pre-diabetes     Past Surgical History:  Procedure Laterality Date  . ABDOMINAL HYSTERECTOMY    . Arthroscopic surgery left knee    . BACK SURGERY     2005 ,  2017  . CESAREAN SECTION    . CHOLECYSTECTOMY    . COLONOSCOPY  2003, 2011   3 mm adenoma 2011  . ESOPHAGOGASTRODUODENOSCOPY  multiple since 2003  . IR GENERIC HISTORICAL  12/19/2015   IR ANGIOGRAM SELECTIVE EACH ADDITIONAL VESSEL 12/19/2015 Greggory Keen, MD [facility]  . IR GENERIC HISTORICAL  12/19/2015   IR  ANGIOGRAM PULMONARY BILATERAL SELECTIVE 12/19/2015 Greggory Keen, MD [facility]  . IR GENERIC HISTORICAL  12/19/2015   IR INFUSION THROMBOL ARTERIAL INITIAL (MS) 12/19/2015 Greggory Keen, MD [facility]  . IR GENERIC HISTORICAL  12/19/2015   IR US GUIDE VASC ACCESS RIGHT 12/19/2015 Greggory Keen, MD [facility]  . IR GENERIC HISTORICAL  12/19/2015   IR ANGIOGRAM SELECTIVE EACH ADDITIONAL VESSEL 12/19/2015 Greggory Keen, MD [facility]  . IR GENERIC HISTORICAL  12/20/2015   IR THROMB F/U EVAL ART/VEN FINAL DAY (MS) 12/20/2015 Corrie Mckusick, DO [facility]  . IR GENERIC HISTORICAL  12/19/2015   IR INFUSION THROMBOL ARTERIAL INITIAL (MS) 12/19/2015 Greggory Keen, MD [facility]  . PLANTAR FASCIA RELEASE     right  . Right knee replacement    . SPINAL FUSION  10/31/2015   revision at [place]    . TOTAL KNEE ARTHROPLASTY Left 10/13/2017  . TOTAL KNEE ARTHROPLASTY Left 10/13/2017   Procedure: LEFT TOTAL KNEE ARTHROPLASTY;  Surgeon: Mcarthur Rossetti, MD;  Location: [place];  Service: Orthopedics;  Laterality: Left;  . ULNAR TUNNEL RELEASE     right  . VENOUS ABLATION     x 2    There were  Type 2 diabetes mellitus without complication, without long-term current use of insulin ([place]) 01/21/2016  . H/O Spinal surgery 12/21/2015  . Barrett's esophagus 12/21/2015  . Hx of adenomatous polyp of colon 03/01/2002    HARRIS,KAREN  PTA 01/19/2018, 6:24 PM  [facility] [address] [place], [place], [address] Phone: [phone]   Fax:  [phone]  Name: [name] MRN: [id number] Date of Birth: [birth year]  Type 2 diabetes mellitus without complication, without long-term current use of insulin ([place]) 01/21/2016  . H/O Spinal surgery 12/21/2015  . Barrett's esophagus 12/21/2015  . Hx of adenomatous polyp of colon 03/01/2002    HARRIS,KAREN  PTA 01/19/2018, 6:24 PM  [facility] [address] [place], [place], [address] Phone: [phone]   Fax:  [phone]  Name: [name] MRN: [id number] Date of Birth: [birth year]

## 2018-01-19 NOTE — Patient Instructions (Signed)
Continue to make time to do the exercises.

## 2018-01-20 ENCOUNTER — Encounter (INDEPENDENT_AMBULATORY_CARE_PROVIDER_SITE_OTHER): Payer: Self-pay | Admitting: Orthopaedic Surgery

## 2018-01-20 ENCOUNTER — Encounter: Payer: Self-pay | Admitting: Physical Therapy

## 2018-01-20 ENCOUNTER — Ambulatory Visit (INDEPENDENT_AMBULATORY_CARE_PROVIDER_SITE_OTHER): Payer: No Typology Code available for payment source | Admitting: Orthopaedic Surgery

## 2018-01-20 ENCOUNTER — Ambulatory Visit: Payer: No Typology Code available for payment source | Admitting: Physical Therapy

## 2018-01-20 DIAGNOSIS — M25562 Pain in left knee: Secondary | ICD-10-CM

## 2018-01-20 DIAGNOSIS — M25662 Stiffness of left knee, not elsewhere classified: Secondary | ICD-10-CM | POA: Diagnosis not present

## 2018-01-20 DIAGNOSIS — R2689 Other abnormalities of gait and mobility: Secondary | ICD-10-CM

## 2018-01-20 DIAGNOSIS — Z96652 Presence of left artificial knee joint: Secondary | ICD-10-CM

## 2018-01-20 DIAGNOSIS — G8929 Other chronic pain: Secondary | ICD-10-CM

## 2018-01-20 NOTE — Therapy (Signed)
[facility] [address] [place], [place], [address] Phone: [phone]   Fax:  [phone]  Physical Therapy Treatment  Patient Details  Name: [name] MRN: [id number] Date of Birth: May 05, 1950 Referring Provider (PT): Kirtland Bouchard, [place]   Encounter Date: 01/20/2018  PT End of Session - 01/20/18 1504    Visit Number  14    Number of Visits  23    Date for PT Re-Evaluation  02/18/18    Authorization Type  medicare: KX md by 15th visit, progress note by 20th visit    PT Start Time  1502    PT Stop Time  1545    PT Time Calculation (min)  43 min    Activity Tolerance  Patient tolerated treatment well    Behavior During Therapy  [facility] for tasks assessed/performed       Past Medical History:  Diagnosis Date  . Abnormal thyroid function test   . Allergy   . Anemia    she attributes previously to fibroids, she declines  . Barrett's esophagus   . Blood transfusion without reported diagnosis   . DDD (degenerative disc disease), cervical   . DJD (degenerative joint disease)   . GERD (gastroesophageal reflux disease)   . Heart murmur   . History of hiatal hernia   . Hx of adenomatous polyp of colon 03/01/2002  . OA (osteoarthritis)   . Obese   . Paroxysmal atrial fibrillation (HCC)   . PE (pulmonary embolism) 12/21/2015  . Plantar fasciitis   . PONV (postoperative nausea and vomiting)    pt. reports that she woke up during surgery  . Pre-diabetes     Past Surgical History:  Procedure Laterality Date  . ABDOMINAL HYSTERECTOMY    . Arthroscopic surgery left knee    . BACK SURGERY     2005 ,  2017  . CESAREAN SECTION    . CHOLECYSTECTOMY    . COLONOSCOPY  2003, 2011   3 mm adenoma 2011  . ESOPHAGOGASTRODUODENOSCOPY  multiple since 2003  . IR GENERIC HISTORICAL  12/19/2015   IR ANGIOGRAM SELECTIVE EACH ADDITIONAL VESSEL 12/19/2015 [name], MD [facility]  . IR GENERIC HISTORICAL  12/19/2015   IR  ANGIOGRAM PULMONARY BILATERAL SELECTIVE 12/19/2015 [name], MD [facility]  . IR GENERIC HISTORICAL  12/19/2015   IR INFUSION THROMBOL ARTERIAL INITIAL (MS) 12/19/2015 [name], MD [facility]  . IR GENERIC HISTORICAL  12/19/2015   IR US GUIDE VASC ACCESS RIGHT 12/19/2015 [name], MD [facility]  . IR GENERIC HISTORICAL  12/19/2015   IR ANGIOGRAM SELECTIVE EACH ADDITIONAL VESSEL 12/19/2015 [name], MD [facility]  . IR GENERIC HISTORICAL  12/20/2015   IR THROMB F/U EVAL ART/VEN FINAL DAY (MS) 12/20/2015 Gilmer Mor, DO [facility]  . IR GENERIC HISTORICAL  12/19/2015   IR INFUSION THROMBOL ARTERIAL INITIAL (MS) 12/19/2015 [name], MD [facility]  . PLANTAR FASCIA RELEASE     right  . Right knee replacement    . SPINAL FUSION  10/31/2015   revision at [facility]   . TONSILLECTOMY    . TOTAL KNEE ARTHROPLASTY Left 10/13/2017  . TOTAL KNEE ARTHROPLASTY Left 10/13/2017   Procedure: LEFT TOTAL KNEE ARTHROPLASTY;  Surgeon: Kathryne Hitch, MD;  [facility]: [facility];  Service: Orthopedics;  Laterality: Left;  . ULNAR TUNNEL RELEASE     right  . VENOUS ABLATION     x 2    There were  no vitals filed for this visit.  Subjective Assessment - 01/20/18 1505    Subjective  "I did see the MD and he was concerned with the amount of swelling and the tightness in the hamstring. He says I return to work on November 4th"    Currently in Pain?  Yes    Pain Score  3     Pain Orientation  Left                       [facility] Adult PT Treatment/Exercise - 01/20/18 1511      Knee/Hip Exercises: Stretches   Quad Stretch  2 reps;20 seconds      Knee/Hip Exercises: Aerobic   Recumbent Bike  6 min 3 min going backward 3 min forward      Knee/Hip Exercises: Standing   Forward Step Up  2 sets;Step Height: 6";Hand Hold: 1    Functional Squat  2 sets;10 reps   in //     Manual Therapy   Manual Therapy  Other (comment)    Manual therapy comments   DTM along the semi-tendionosus     Joint Mobilization  seated PA/ AP mobs in sitting grade 3-4    Other Manual Therapy  manual trigger point release along the semi-tendionsus          Balance Exercises - 01/20/18 1549      Balance Exercises: Standing   Standing Eyes Opened  Narrow base of support (BOS);4 reps;30 secs   in //    Standing Eyes Closed  2 reps;30 secs;Narrow base of support (BOS)   in //       PT Education - 01/20/18 1546    Education Details  updated HEP for standing balance in the corner    Person(s) Educated  Patient    Methods  Explanation;Verbal cues;Handout    Comprehension  Verbalized understanding;Verbal cues required       PT Short Term Goals - 01/07/18 1202      PT SHORT TERM GOAL #1   Title  pt will be I with HEP    Time  4    Period  Weeks    Status  Achieved      PT SHORT TERM GOAL #2   Title  Pt will demonstrate decreased edema in LLE to be equal to RLE (46cm measured 10 deg distal to tibial tuberosity)    Time  4    Period  Weeks    Status  Achieved        PT Long Term Goals - 01/19/18 1819      PT LONG TERM GOAL #1   Title  Pt will demonstrate increased L knee flexion AROM to >/= 105 degrees to improve functional mobility and improve gait mechanics    Baseline  90,  varies    Time  8    Period  Weeks    Status  On-going      PT LONG TERM GOAL #2   Baseline  extension 6 degrees     Time  8    Period  Weeks    Status  On-going      PT LONG TERM GOAL #3   Title  Pt will be able to stand and walk >/= 30 min with LRAD and </= 2/10 pain in order to be able to perform work related duties     Baseline  can walk 30 minutes in store with cart with no pain.  If longer walking or standing  pain increases to 6/10.    Time  8    Period  Weeks    Status  Partially Met      PT LONG TERM GOAL #4   Title  Pt will decrease FOTO score to </= 56% limited to improve function an quality of life    Time  8    Period  Weeks    Status  Unable  to assess      PT LONG TERM GOAL #5   Title  Pt will be I with HEP by final visit to ensure progress beyond discharge from physical therapy    Baseline  independent with exercises issued so far, unsure about compliance fatigues    Time  8    Period  Weeks    Status  On-going            Plan - 01/20/18 1546    Clinical Impression Statement  pt reports MD's visit went well with some concern for swelling and continued hamstring spasm. worked on hamstring tightness and continued hip/knee strengthening which she performed well. progressed balanced training which she perfmred well with minimal postural sway and provided HEP for home. end of sessio nshe reported decreased pain.     PT Treatment/Interventions  Cryotherapy;Electrical Stimulation;ADLs/Self Care Home Management;Iontophoresis 4mg /ml Dexamethasone;Moist Heat;Ultrasound;DME Instruction;Gait training;Stair training;Functional mobility training;Therapeutic activities;Therapeutic exercise;Balance training;Neuromuscular re-education;Patient/family education;Manual techniques;Passive range of motion;Dry needling;Energy conservation;Taping    PT Next Visit Plan   Endurance, functional mobility, Balance    PT Home Exercise Plan  heel slides, SAQs, seated hamstring stretch,  hamstring curl, step ups, corner balance    Consulted and Agree with Plan of Care  Patient       Patient will benefit from skilled therapeutic intervention in order to improve the following deficits and impairments:  Abnormal gait, Improper body mechanics, Impaired sensation, Pain, Decreased mobility, Decreased scar mobility, Postural dysfunction, Decreased activity tolerance, Decreased endurance, Decreased range of motion, Decreased strength, Hypomobility, Decreased balance, Difficulty walking, Increased edema, Impaired flexibility, Obesity  Visit Diagnosis: Stiffness of left knee, not elsewhere classified  Other abnormalities of gait and mobility  Chronic pain of  left knee     Problem List Patient Active Problem List   Diagnosis Date Noted  . B12 deficiency 01/08/2018  . Vitamin D deficiency 01/08/2018  . History of pulmonary embolism 01/08/2018  . Status post total left knee replacement 10/13/2017  . Family history of thyroid disease 08/25/2017  . Chronic pain of left knee 05/20/2017  . Unilateral primary osteoarthritis, left knee 05/20/2017  . Morbid obesity (HCC) 04/30/2017  . Adnexal mass 02/17/2017  . Family history of ovarian cancer 02/17/2017  . IBS (irritable bowel syndrome) 01/21/2017  . Paroxysmal atrial fibrillation (HCC) 06/18/2016  . Type 2 diabetes mellitus without complication, without long-term current use of insulin (HCC) 01/21/2016  . H/O Spinal surgery 12/21/2015  . Barrett's esophagus 12/21/2015  . Hx of adenomatous polyp of colon 03/01/2002   Lulu Riding PT, DPT, LAT, ATC  01/20/18  3:50 PM      [facility] [address] [place], [place], [address] Phone: [phone]   Fax:  [phone]  Name: [name] MRN: [id number] Date of Birth: April 14, 1951

## 2018-01-20 NOTE — Progress Notes (Signed)
Patient is now 4 months status post a left total knee arthroplasty.  She is ambulate with a cane.  She is been going through extensive physical therapy work on getting her knee bending and moving.  They have been working on her hamstrings and her quads.  She is ambulate with a cane still.  I have reviewed notes from therapy as well.  Overall she feels like she is making progress.  She has therapy through the month of October.  She does work at a sedentary job and would like to get back to that job starting November 4.  On exam her extension is almost full and her flexion is to past 90 degrees but at the posterior horn.  Some of her motion is limited by her morbid obesity.  Her incisions well-healed and there is minimal swelling.  The knee feels loosely stable.  At this point she will continue therapy through the month of October.  I do not need to see her back from my standpoint for 3 months.  At her next visit I would like an AP and lateral of the left knee.  All question concerns were answered and addressed.  I did give her a work note today as well for returning starting November 4.

## 2018-01-26 ENCOUNTER — Encounter: Payer: Self-pay | Admitting: Physical Therapy

## 2018-01-26 ENCOUNTER — Ambulatory Visit: Payer: No Typology Code available for payment source | Admitting: Physical Therapy

## 2018-01-26 DIAGNOSIS — G8929 Other chronic pain: Secondary | ICD-10-CM

## 2018-01-26 DIAGNOSIS — M25562 Pain in left knee: Secondary | ICD-10-CM

## 2018-01-26 DIAGNOSIS — M25662 Stiffness of left knee, not elsewhere classified: Secondary | ICD-10-CM | POA: Diagnosis not present

## 2018-01-26 DIAGNOSIS — R2689 Other abnormalities of gait and mobility: Secondary | ICD-10-CM

## 2018-01-26 NOTE — Therapy (Signed)
[place], [place], [address] Phone: [phone]   Fax:  [phone]  Physical Therapy Treatment  Patient Details  Name: [name] MRN: [id number] Date of Birth: 07/19/1950 Referring Provider (PT): Pete Pelt, [place]   Encounter Date: 01/26/2018  PT End of Session - 01/26/18 1143    Visit Number  15    Number of Visits  23    Date for PT Re-Evaluation  02/18/18    Authorization Type  medicare: KX md by 15th visit, progress note by 20th visit    PT Start Time  1143    PT Stop Time  1230    PT Time Calculation (min)  47 min    Activity Tolerance  Patient tolerated treatment well    Behavior During Therapy  [facility] for tasks assessed/performed       Past Medical History:  Diagnosis Date  . Abnormal thyroid function test   . Allergy   . Anemia    she attributes previously to fibroids, she declines  . Barrett's esophagus   . Blood transfusion without reported diagnosis   . DDD (degenerative disc disease), cervical   . DJD (degenerative joint disease)   . GERD (gastroesophageal reflux disease)   . Heart murmur   . History of hiatal hernia   . Hx of adenomatous polyp of colon 03/01/2002  . OA (osteoarthritis)   . Obese   . Paroxysmal atrial fibrillation (HCC)   . PE (pulmonary embolism) 12/21/2015  . Plantar fasciitis   . PONV (postoperative nausea and vomiting)    pt. reports that she woke up during surgery  . Pre-diabetes     Past Surgical History:  Procedure Laterality Date  . ABDOMINAL HYSTERECTOMY    . Arthroscopic surgery left knee    . BACK SURGERY     2005 ,  2017  . CESAREAN SECTION    . CHOLECYSTECTOMY    . COLONOSCOPY  2003, 2011   3 mm adenoma 2011  . ESOPHAGOGASTRODUODENOSCOPY  multiple since 2003  . IR GENERIC HISTORICAL  12/19/2015   IR ANGIOGRAM SELECTIVE EACH ADDITIONAL VESSEL 12/19/2015 Greggory Keen, MD [facility]  . IR GENERIC HISTORICAL  12/19/2015   IR  ANGIOGRAM PULMONARY BILATERAL SELECTIVE 12/19/2015 Greggory Keen, MD [facility]  . IR GENERIC HISTORICAL  12/19/2015   IR INFUSION THROMBOL ARTERIAL INITIAL (MS) 12/19/2015 Greggory Keen, MD [facility]  . IR GENERIC HISTORICAL  12/19/2015   IR US GUIDE VASC ACCESS RIGHT 12/19/2015 Greggory Keen, MD [facility]  . IR GENERIC HISTORICAL  12/19/2015   IR ANGIOGRAM SELECTIVE EACH ADDITIONAL VESSEL 12/19/2015 Greggory Keen, MD [facility]  . IR GENERIC HISTORICAL  12/20/2015   IR THROMB F/U EVAL ART/VEN FINAL DAY (MS) 12/20/2015 Corrie Mckusick, DO [facility]  . IR GENERIC HISTORICAL  12/19/2015   IR INFUSION THROMBOL ARTERIAL INITIAL (MS) 12/19/2015 Greggory Keen, MD [facility]  . PLANTAR FASCIA RELEASE     right  . Right knee replacement    . SPINAL FUSION  10/31/2015   revision at [place]    . TOTAL KNEE ARTHROPLASTY Left 10/13/2017  . TOTAL KNEE ARTHROPLASTY Left 10/13/2017   Procedure: LEFT TOTAL KNEE ARTHROPLASTY;  Surgeon: Mcarthur Rossetti, MD;  Location: [place];  Service: Orthopedics;  Laterality: Left;  . ULNAR TUNNEL RELEASE     right  . VENOUS ABLATION     x 2    There were  12:42 PM      [place], [place], [address] Phone: [phone]   Fax:  [phone]  Name: [name] MRN: [id number] Date of Birth: 11/20/1950  12:42 PM      [place], [place], [address] Phone: [phone]   Fax:  [phone]  Name: [name] MRN: [id number] Date of Birth: 11/20/1950  [place], [place], [address] Phone: [phone]   Fax:  [phone]  Physical Therapy Treatment  Patient Details  Name: [name] MRN: [id number] Date of Birth: 07/19/1950 Referring Provider (PT): Pete Pelt, [place]   Encounter Date: 01/26/2018  PT End of Session - 01/26/18 1143    Visit Number  15    Number of Visits  23    Date for PT Re-Evaluation  02/18/18    Authorization Type  medicare: KX md by 15th visit, progress note by 20th visit    PT Start Time  1143    PT Stop Time  1230    PT Time Calculation (min)  47 min    Activity Tolerance  Patient tolerated treatment well    Behavior During Therapy  [facility] for tasks assessed/performed       Past Medical History:  Diagnosis Date  . Abnormal thyroid function test   . Allergy   . Anemia    she attributes previously to fibroids, she declines  . Barrett's esophagus   . Blood transfusion without reported diagnosis   . DDD (degenerative disc disease), cervical   . DJD (degenerative joint disease)   . GERD (gastroesophageal reflux disease)   . Heart murmur   . History of hiatal hernia   . Hx of adenomatous polyp of colon 03/01/2002  . OA (osteoarthritis)   . Obese   . Paroxysmal atrial fibrillation (HCC)   . PE (pulmonary embolism) 12/21/2015  . Plantar fasciitis   . PONV (postoperative nausea and vomiting)    pt. reports that she woke up during surgery  . Pre-diabetes     Past Surgical History:  Procedure Laterality Date  . ABDOMINAL HYSTERECTOMY    . Arthroscopic surgery left knee    . BACK SURGERY     2005 ,  2017  . CESAREAN SECTION    . CHOLECYSTECTOMY    . COLONOSCOPY  2003, 2011   3 mm adenoma 2011  . ESOPHAGOGASTRODUODENOSCOPY  multiple since 2003  . IR GENERIC HISTORICAL  12/19/2015   IR ANGIOGRAM SELECTIVE EACH ADDITIONAL VESSEL 12/19/2015 Greggory Keen, MD [facility]  . IR GENERIC HISTORICAL  12/19/2015   IR  ANGIOGRAM PULMONARY BILATERAL SELECTIVE 12/19/2015 Greggory Keen, MD [facility]  . IR GENERIC HISTORICAL  12/19/2015   IR INFUSION THROMBOL ARTERIAL INITIAL (MS) 12/19/2015 Greggory Keen, MD [facility]  . IR GENERIC HISTORICAL  12/19/2015   IR US GUIDE VASC ACCESS RIGHT 12/19/2015 Greggory Keen, MD [facility]  . IR GENERIC HISTORICAL  12/19/2015   IR ANGIOGRAM SELECTIVE EACH ADDITIONAL VESSEL 12/19/2015 Greggory Keen, MD [facility]  . IR GENERIC HISTORICAL  12/20/2015   IR THROMB F/U EVAL ART/VEN FINAL DAY (MS) 12/20/2015 Corrie Mckusick, DO [facility]  . IR GENERIC HISTORICAL  12/19/2015   IR INFUSION THROMBOL ARTERIAL INITIAL (MS) 12/19/2015 Greggory Keen, MD [facility]  . PLANTAR FASCIA RELEASE     right  . Right knee replacement    . SPINAL FUSION  10/31/2015   revision at [place]    . TOTAL KNEE ARTHROPLASTY Left 10/13/2017  . TOTAL KNEE ARTHROPLASTY Left 10/13/2017   Procedure: LEFT TOTAL KNEE ARTHROPLASTY;  Surgeon: Mcarthur Rossetti, MD;  Location: [place];  Service: Orthopedics;  Laterality: Left;  . ULNAR TUNNEL RELEASE     right  . VENOUS ABLATION     x 2    There were

## 2018-01-28 ENCOUNTER — Encounter: Payer: Self-pay | Admitting: Physical Therapy

## 2018-01-28 ENCOUNTER — Ambulatory Visit: Payer: No Typology Code available for payment source | Admitting: Physical Therapy

## 2018-01-28 DIAGNOSIS — G8929 Other chronic pain: Secondary | ICD-10-CM

## 2018-01-28 DIAGNOSIS — M25662 Stiffness of left knee, not elsewhere classified: Secondary | ICD-10-CM | POA: Diagnosis not present

## 2018-01-28 DIAGNOSIS — R2689 Other abnormalities of gait and mobility: Secondary | ICD-10-CM

## 2018-01-28 DIAGNOSIS — M25562 Pain in left knee: Secondary | ICD-10-CM

## 2018-01-28 NOTE — Therapy (Signed)
[facility] [address] [place], [place], [address] Phone: [phone]   Fax:  [phone]  Physical Therapy Treatment  Patient Details  Name: [name] MRN: [id number] Date of Birth: 28-Aug-1950 Referring Provider (PT): Kirtland Bouchard, [place]   Encounter Date: 01/28/2018  PT End of Session - 01/28/18 1548    Visit Number  16    Number of Visits  23    Date for PT Re-Evaluation  02/18/18    Authorization Type  medicare: KX md by 15th visit, progress note by 20th visit    PT Start Time  1500    PT Stop Time  1545    PT Time Calculation (min)  45 min    Activity Tolerance  Patient tolerated treatment well    Behavior During Therapy  [facility] for tasks assessed/performed       Past Medical History:  Diagnosis Date  . Abnormal thyroid function test   . Allergy   . Anemia    she attributes previously to fibroids, she declines  . Barrett's esophagus   . Blood transfusion without reported diagnosis   . DDD (degenerative disc disease), cervical   . DJD (degenerative joint disease)   . GERD (gastroesophageal reflux disease)   . Heart murmur   . History of hiatal hernia   . Hx of adenomatous polyp of colon 03/01/2002  . OA (osteoarthritis)   . Obese   . Paroxysmal atrial fibrillation (HCC)   . PE (pulmonary embolism) 12/21/2015  . Plantar fasciitis   . PONV (postoperative nausea and vomiting)    pt. reports that she woke up during surgery  . Pre-diabetes     Past Surgical History:  Procedure Laterality Date  . ABDOMINAL HYSTERECTOMY    . Arthroscopic surgery left knee    . BACK SURGERY     2005 ,  2017  . CESAREAN SECTION    . CHOLECYSTECTOMY    . COLONOSCOPY  2003, 2011   3 mm adenoma 2011  . ESOPHAGOGASTRODUODENOSCOPY  multiple since 2003  . IR GENERIC HISTORICAL  12/19/2015   IR ANGIOGRAM SELECTIVE EACH ADDITIONAL VESSEL 12/19/2015 [name], MD [facility]  . IR GENERIC HISTORICAL  12/19/2015   IR  ANGIOGRAM PULMONARY BILATERAL SELECTIVE 12/19/2015 [name], MD [facility]  . IR GENERIC HISTORICAL  12/19/2015   IR INFUSION THROMBOL ARTERIAL INITIAL (MS) 12/19/2015 [name], MD [facility]  . IR GENERIC HISTORICAL  12/19/2015   IR US GUIDE VASC ACCESS RIGHT 12/19/2015 [name], MD [facility]  . IR GENERIC HISTORICAL  12/19/2015   IR ANGIOGRAM SELECTIVE EACH ADDITIONAL VESSEL 12/19/2015 [name], MD [facility]  . IR GENERIC HISTORICAL  12/20/2015   IR THROMB F/U EVAL ART/VEN FINAL DAY (MS) 12/20/2015 Gilmer Mor, DO [facility]  . IR GENERIC HISTORICAL  12/19/2015   IR INFUSION THROMBOL ARTERIAL INITIAL (MS) 12/19/2015 [name], MD [facility]  . PLANTAR FASCIA RELEASE     right  . Right knee replacement    . SPINAL FUSION  10/31/2015   revision at [facility]   . TONSILLECTOMY    . TOTAL KNEE ARTHROPLASTY Left 10/13/2017  . TOTAL KNEE ARTHROPLASTY Left 10/13/2017   Procedure: LEFT TOTAL KNEE ARTHROPLASTY;  Surgeon: Kathryne Hitch, MD;  Location: [facility];  Service: Orthopedics;  Laterality: Left;  . ULNAR TUNNEL RELEASE     right  . VENOUS ABLATION     x 2    There were  no vitals filed for this visit.  Subjective Assessment - 01/28/18 1759    Subjective  I resally worked the last time and i have had a cramp in my calf ever since.  It is better today.  My husband saw a welt there and thought something bit me.  No welt there today.     Currently in Pain?  No/denies    Pain Score  0-No pain   can get to 8/10   Pain Location  Calf    Pain Orientation  Left;Posterior    Pain Descriptors / Indicators  Cramping    Pain Type  Surgical pain    Pain Frequency  Intermittent    Aggravating Factors   extra hard work out?  or something biting her??    Pain Relieving Factors  gentle stretches and resting    Effect of Pain on Daily Activities  notr back to work, limited ADL.    Multiple Pain Sites  No                        [facility] Adult PT Treatment/Exercise - 01/28/18 0001      High Level Balance   High Level Balance Comments  in parallel bars.  forward/ back/,  side steps each way,  high march,.  balance exercises in corner on floor and on pillow static and dynamis eyes open closed ,  reaching up/ out side to side.  also in bars heel walk 1 length and tip toe walk 3 length,  otherwise bar distance 2 laps.  SBA for balance ecxercise.   noticed her muscles working harder on the leftt     Knee/Hip Exercises: Aerobic   Nustep  L5 X 10 minutes to work on endurance.      Knee/Hip Exercises: Standing   Stairs  strengthening with descending stairs in PEDS gym,  eccentric control improving             PT Education - 01/28/18 1548    Education Details  exercise form    Person(s) Educated  Patient    Methods  Explanation;Demonstration;Verbal cues    Comprehension  Verbalized understanding;Returned demonstration       PT Short Term Goals - 01/07/18 1202      PT SHORT TERM GOAL #1   Title  pt will be I with HEP    Time  4    Period  Weeks    Status  Achieved      PT SHORT TERM GOAL #2   Title  Pt will demonstrate decreased edema in LLE to be equal to RLE (46cm measured 10 deg distal to tibial tuberosity)    Time  4    Period  Weeks    Status  Achieved        PT Long Term Goals - 01/28/18 1810      PT LONG TERM GOAL #1   Title  Pt will demonstrate increased L knee flexion AROM to >/= 105 degrees to improve functional mobility and improve gait mechanics    Baseline  not measured,  functionally demonstrates about 90 degrees flexion    Time  8    Period  Weeks    Status  On-going      PT LONG TERM GOAL #2   Title  Pt will achieve 0 degrees of extension to improve functional mobility and improve gait mechanics    Time  8    Period  Weeks  Status  Unable to assess      PT LONG TERM GOAL #3   Title  Pt will be able to stand and walk >/= 30 min with LRAD  and </= 2/10 pain in order to be able to perform work related duties     Baseline  pain flare up to 8/10 in calf,  walking limited    Time  8    Period  Weeks    Status  On-going      PT LONG TERM GOAL #4   Title  Pt will decrease FOTO score to </= 56% limited to improve function an quality of life    Time  8    Status  Unable to assess      PT LONG TERM GOAL #5   Title  Pt will be I with HEP by final visit to ensure progress beyond discharge from physical therapy    Baseline  not always compliant per patient due to pain flare    Time  8    Period  Weeks    Status  On-going            Plan - 01/28/18 1805    Clinical Impression Statement  Balance improving in clinic with less perturbations as the repititions increase.  She had a pain flare in the calf  "Cramp" since last visit.  It had lessened since then and today was "QUIET".  She declined the need for modalities for pain post session.  She tolerated 10 minutes on Nu STep with improved speed. for endurance.    PT Next Visit Plan   Endurance, functional mobility, Balance, ROM    PT Home Exercise Plan  heel slides, SAQs, seated hamstring stretch,  hamstring curl, step ups, corner balance    Consulted and Agree with Plan of Care  Patient       Patient will benefit from skilled therapeutic intervention in order to improve the following deficits and impairments:     Visit Diagnosis: Stiffness of left knee, not elsewhere classified  Other abnormalities of gait and mobility  Chronic pain of left knee     Problem List Patient Active Problem List   Diagnosis Date Noted  . B12 deficiency 01/08/2018  . Vitamin D deficiency 01/08/2018  . History of pulmonary embolism 01/08/2018  . Status post total left knee replacement 10/13/2017  . Family history of thyroid disease 08/25/2017  . Chronic pain of left knee 05/20/2017  . Unilateral primary osteoarthritis, left knee 05/20/2017  . Morbid obesity (HCC) 04/30/2017  . Adnexal  mass 02/17/2017  . Family history of ovarian cancer 02/17/2017  . IBS (irritable bowel syndrome) 01/21/2017  . Paroxysmal atrial fibrillation (HCC) 06/18/2016  . Type 2 diabetes mellitus without complication, without long-term current use of insulin (HCC) 01/21/2016  . H/O Spinal surgery 12/21/2015  . Barrett's esophagus 12/21/2015  . Hx of adenomatous polyp of colon 03/01/2002    HARRIS,KARENPTA 01/28/2018, 6:13 PM  [facility] [address] [place], [place], [address] Phone: [phone]   Fax:  [phone]  Name: [name] MRN: [id number] Date of Birth: 02/05/1951

## 2018-02-02 ENCOUNTER — Encounter: Payer: Self-pay | Admitting: Physical Therapy

## 2018-02-02 ENCOUNTER — Ambulatory Visit: Payer: No Typology Code available for payment source | Admitting: Physical Therapy

## 2018-02-02 DIAGNOSIS — M25562 Pain in left knee: Secondary | ICD-10-CM

## 2018-02-02 DIAGNOSIS — M25662 Stiffness of left knee, not elsewhere classified: Secondary | ICD-10-CM | POA: Diagnosis not present

## 2018-02-02 DIAGNOSIS — R2689 Other abnormalities of gait and mobility: Secondary | ICD-10-CM

## 2018-02-02 DIAGNOSIS — G8929 Other chronic pain: Secondary | ICD-10-CM

## 2018-02-02 NOTE — Therapy (Signed)
[place], [place], [address] Phone: [phone]   Fax:  [phone]  Physical Therapy Treatment  Patient Details  Name: [name] MRN: [id number] Date of Birth: 1950-05-25 Referring Provider (PT): Pete Pelt, [place]   Encounter Date: 02/02/2018  PT End of Session - 02/02/18 1245    Visit Number  17    Number of Visits  23    Date for PT Re-Evaluation  02/18/18    Authorization Type  medicare: KX md by 15th visit, progress note by 20th visit    PT Start Time  1147    PT Stop Time  1232    PT Time Calculation (min)  45 min    Activity Tolerance  Patient tolerated treatment well    Behavior During Therapy  [facility] for tasks assessed/performed       Past Medical History:  Diagnosis Date  . Abnormal thyroid function test   . Allergy   . Anemia    she attributes previously to fibroids, she declines  . Barrett's esophagus   . Blood transfusion without reported diagnosis   . DDD (degenerative disc disease), cervical   . DJD (degenerative joint disease)   . GERD (gastroesophageal reflux disease)   . Heart murmur   . History of hiatal hernia   . Hx of adenomatous polyp of colon 03/01/2002  . OA (osteoarthritis)   . Obese   . Paroxysmal atrial fibrillation (HCC)   . PE (pulmonary embolism) 12/21/2015  . Plantar fasciitis   . PONV (postoperative nausea and vomiting)    pt. reports that she woke up during surgery  . Pre-diabetes     Past Surgical History:  Procedure Laterality Date  . ABDOMINAL HYSTERECTOMY    . Arthroscopic surgery left knee    . BACK SURGERY     2005 ,  2017  . CESAREAN SECTION    . CHOLECYSTECTOMY    . COLONOSCOPY  2003, 2011   3 mm adenoma 2011  . ESOPHAGOGASTRODUODENOSCOPY  multiple since 2003  . IR GENERIC HISTORICAL  12/19/2015   IR ANGIOGRAM SELECTIVE EACH ADDITIONAL VESSEL 12/19/2015 Greggory Keen, MD [facility]  . IR GENERIC HISTORICAL  12/19/2015   IR  ANGIOGRAM PULMONARY BILATERAL SELECTIVE 12/19/2015 Greggory Keen, MD [facility]  . IR GENERIC HISTORICAL  12/19/2015   IR INFUSION THROMBOL ARTERIAL INITIAL (MS) 12/19/2015 Greggory Keen, MD [facility]  . IR GENERIC HISTORICAL  12/19/2015   IR US GUIDE VASC ACCESS RIGHT 12/19/2015 Greggory Keen, MD [facility]  . IR GENERIC HISTORICAL  12/19/2015   IR ANGIOGRAM SELECTIVE EACH ADDITIONAL VESSEL 12/19/2015 Greggory Keen, MD [facility]  . IR GENERIC HISTORICAL  12/20/2015   IR THROMB F/U EVAL ART/VEN FINAL DAY (MS) 12/20/2015 Corrie Mckusick, DO [facility]  . IR GENERIC HISTORICAL  12/19/2015   IR INFUSION THROMBOL ARTERIAL INITIAL (MS) 12/19/2015 Greggory Keen, MD [facility]  . PLANTAR FASCIA RELEASE     right  . Right knee replacement    . SPINAL FUSION  10/31/2015   revision at [place]    . TOTAL KNEE ARTHROPLASTY Left 10/13/2017  . TOTAL KNEE ARTHROPLASTY Left 10/13/2017   Procedure: LEFT TOTAL KNEE ARTHROPLASTY;  Surgeon: Mcarthur Rossetti, MD;  Location: [facility];  Service: Orthopedics;  Laterality: Left;  . ULNAR TUNNEL RELEASE     right  . VENOUS ABLATION     x 2    There were  and walk >/= 30 min with LRAD and </= 2/10 pain in order to be able to perform work related duties     Baseline  Able to do in  clinic without pain    Time  8    Period  Weeks    Status  Achieved      PT LONG TERM GOAL #4   Title  Pt will  decrease FOTO score to </= 56% limited to improve function an quality of life    Time  8    Period  Weeks    Status  Unable to assess      PT LONG TERM GOAL #5   Title  Pt will be I with HEP by final visit to ensure progress beyond discharge from physical therapy    Baseline  doing the exercises ,  trying to build endurance in prep of return to work    Time  8    Period  Weeks    Status  Partially Met            Plan - 02/02/18 1246    Clinical Impression Statement  Patient able to exercise non stop with 2 brief sitting rests.  Endurance improving.   Main focus today balance.  (More challanging on the fluffy PEDS gym pillows. )  No pain at the end of session.   LTG#3 met,  LTG#4 partially met.    PT Next Visit Plan  1 more visit prior to return to work.   Remember KX modifier.     PT Home Exercise Plan  heel slides, SAQs, seated hamstring stretch,  hamstring curl, step ups, corner balance    Consulted and Agree with Plan of Care  Patient       Patient will benefit from skilled therapeutic intervention in order to improve the following deficits and impairments:     Visit Diagnosis: Stiffness of left knee, not elsewhere classified  Other abnormalities of gait and mobility  Chronic pain of left knee     Problem List Patient Active Problem List   Diagnosis Date Noted  . B12 deficiency 01/08/2018  . Vitamin D deficiency 01/08/2018  . History of pulmonary embolism 01/08/2018  . Status post total left knee replacement 10/13/2017  . Family history of thyroid disease 08/25/2017  . Chronic pain of left knee 05/20/2017  . Unilateral primary osteoarthritis, left knee 05/20/2017  . Morbid obesity ([place]) 04/30/2017  . Adnexal mass 02/17/2017  . Family history of ovarian cancer 02/17/2017  . IBS (irritable bowel syndrome) 01/21/2017  . Paroxysmal atrial fibrillation ([place]) 06/18/2016  . Type 2 diabetes mellitus without complication, without long-term current use of insulin ([place])  01/21/2016  . H/O Spinal surgery 12/21/2015  . Barrett's esophagus 12/21/2015  . Hx of adenomatous polyp of colon 03/01/2002    HARRIS,KAREN PTA 02/02/2018, 12:56 PM  [facility] [address] [place], [place], [address] Phone: [phone]   Fax:  [phone]  Name: [name] MRN: [id number] Date of Birth: 1951-01-28  [place], [place], [address] Phone: [phone]   Fax:  [phone]  Physical Therapy Treatment  Patient Details  Name: [name] MRN: [id number] Date of Birth: 1950-05-25 Referring Provider (PT): Pete Pelt, [place]   Encounter Date: 02/02/2018  PT End of Session - 02/02/18 1245    Visit Number  17    Number of Visits  23    Date for PT Re-Evaluation  02/18/18    Authorization Type  medicare: KX md by 15th visit, progress note by 20th visit    PT Start Time  1147    PT Stop Time  1232    PT Time Calculation (min)  45 min    Activity Tolerance  Patient tolerated treatment well    Behavior During Therapy  [facility] for tasks assessed/performed       Past Medical History:  Diagnosis Date  . Abnormal thyroid function test   . Allergy   . Anemia    she attributes previously to fibroids, she declines  . Barrett's esophagus   . Blood transfusion without reported diagnosis   . DDD (degenerative disc disease), cervical   . DJD (degenerative joint disease)   . GERD (gastroesophageal reflux disease)   . Heart murmur   . History of hiatal hernia   . Hx of adenomatous polyp of colon 03/01/2002  . OA (osteoarthritis)   . Obese   . Paroxysmal atrial fibrillation (HCC)   . PE (pulmonary embolism) 12/21/2015  . Plantar fasciitis   . PONV (postoperative nausea and vomiting)    pt. reports that she woke up during surgery  . Pre-diabetes     Past Surgical History:  Procedure Laterality Date  . ABDOMINAL HYSTERECTOMY    . Arthroscopic surgery left knee    . BACK SURGERY     2005 ,  2017  . CESAREAN SECTION    . CHOLECYSTECTOMY    . COLONOSCOPY  2003, 2011   3 mm adenoma 2011  . ESOPHAGOGASTRODUODENOSCOPY  multiple since 2003  . IR GENERIC HISTORICAL  12/19/2015   IR ANGIOGRAM SELECTIVE EACH ADDITIONAL VESSEL 12/19/2015 Greggory Keen, MD [facility]  . IR GENERIC HISTORICAL  12/19/2015   IR  ANGIOGRAM PULMONARY BILATERAL SELECTIVE 12/19/2015 Greggory Keen, MD [facility]  . IR GENERIC HISTORICAL  12/19/2015   IR INFUSION THROMBOL ARTERIAL INITIAL (MS) 12/19/2015 Greggory Keen, MD [facility]  . IR GENERIC HISTORICAL  12/19/2015   IR US GUIDE VASC ACCESS RIGHT 12/19/2015 Greggory Keen, MD [facility]  . IR GENERIC HISTORICAL  12/19/2015   IR ANGIOGRAM SELECTIVE EACH ADDITIONAL VESSEL 12/19/2015 Greggory Keen, MD [facility]  . IR GENERIC HISTORICAL  12/20/2015   IR THROMB F/U EVAL ART/VEN FINAL DAY (MS) 12/20/2015 Corrie Mckusick, DO [facility]  . IR GENERIC HISTORICAL  12/19/2015   IR INFUSION THROMBOL ARTERIAL INITIAL (MS) 12/19/2015 Greggory Keen, MD [facility]  . PLANTAR FASCIA RELEASE     right  . Right knee replacement    . SPINAL FUSION  10/31/2015   revision at [place]    . TOTAL KNEE ARTHROPLASTY Left 10/13/2017  . TOTAL KNEE ARTHROPLASTY Left 10/13/2017   Procedure: LEFT TOTAL KNEE ARTHROPLASTY;  Surgeon: Mcarthur Rossetti, MD;  Location: [facility];  Service: Orthopedics;  Laterality: Left;  . ULNAR TUNNEL RELEASE     right  . VENOUS ABLATION     x 2    There were

## 2018-02-02 NOTE — Therapy (Signed)
[facility] [address] [place], [place], [address] Phone: [phone]   Fax:  [phone]  Physical Therapy Treatment  Patient Details  Name: [name] MRN: [id number] Date of Birth: July 17, 1950 Referring Provider (PT): Kirtland Bouchard, [place]   Encounter Date: 02/02/2018  PT End of Session - 02/02/18 1245    Visit Number  17    Number of Visits  23    Date for PT Re-Evaluation  02/18/18    Authorization Type  medicare: KX md by 15th visit, progress note by 20th visit    PT Start Time  1147    PT Stop Time  1232    PT Time Calculation (min)  45 min    Activity Tolerance  Patient tolerated treatment well    Behavior During Therapy  [facility] for tasks assessed/performed       Past Medical History:  Diagnosis Date  . Abnormal thyroid function test   . Allergy   . Anemia    she attributes previously to fibroids, she declines  . Barrett's esophagus   . Blood transfusion without reported diagnosis   . DDD (degenerative disc disease), cervical   . DJD (degenerative joint disease)   . GERD (gastroesophageal reflux disease)   . Heart murmur   . History of hiatal hernia   . Hx of adenomatous polyp of colon 03/01/2002  . OA (osteoarthritis)   . Obese   . Paroxysmal atrial fibrillation (HCC)   . PE (pulmonary embolism) 12/21/2015  . Plantar fasciitis   . PONV (postoperative nausea and vomiting)    pt. reports that she woke up during surgery  . Pre-diabetes     Past Surgical History:  Procedure Laterality Date  . ABDOMINAL HYSTERECTOMY    . Arthroscopic surgery left knee    . BACK SURGERY     2005 ,  2017  . CESAREAN SECTION    . CHOLECYSTECTOMY    . COLONOSCOPY  2003, 2011   3 mm adenoma 2011  . ESOPHAGOGASTRODUODENOSCOPY  multiple since 2003  . IR GENERIC HISTORICAL  12/19/2015   IR ANGIOGRAM SELECTIVE EACH ADDITIONAL VESSEL 12/19/2015 [name], MD [facility]  . IR GENERIC HISTORICAL  12/19/2015   IR  ANGIOGRAM PULMONARY BILATERAL SELECTIVE 12/19/2015 [name], MD [facility]  . IR GENERIC HISTORICAL  12/19/2015   IR INFUSION THROMBOL ARTERIAL INITIAL (MS) 12/19/2015 [name], MD [facility]  . IR GENERIC HISTORICAL  12/19/2015   IR US GUIDE VASC ACCESS RIGHT 12/19/2015 [name], MD [facility]  . IR GENERIC HISTORICAL  12/19/2015   IR ANGIOGRAM SELECTIVE EACH ADDITIONAL VESSEL 12/19/2015 [name], MD [facility]  . IR GENERIC HISTORICAL  12/20/2015   IR THROMB F/U EVAL ART/VEN FINAL DAY (MS) 12/20/2015 Gilmer Mor, DO [facility]  . IR GENERIC HISTORICAL  12/19/2015   IR INFUSION THROMBOL ARTERIAL INITIAL (MS) 12/19/2015 [name], MD [facility]  . PLANTAR FASCIA RELEASE     right  . Right knee replacement    . SPINAL FUSION  10/31/2015   revision at [facility]   . TONSILLECTOMY    . TOTAL KNEE ARTHROPLASTY Left 10/13/2017  . TOTAL KNEE ARTHROPLASTY Left 10/13/2017   Procedure: LEFT TOTAL KNEE ARTHROPLASTY;  Surgeon: Kathryne Hitch, MD;  Location: [facility];  Service: Orthopedics;  Laterality: Left;  . ULNAR TUNNEL RELEASE     right  . VENOUS ABLATION     x 2    There were  no vitals filed for this visit.  Subjective Assessment - 02/02/18 1152    Subjective  I have a IBS flare and might need to go to the bathroom.   I want to continue working on my balance    Currently in Pain?  No/denies    Pain Location  Knee    Pain Orientation  Posterior;Left    Pain Descriptors / Indicators  Stabbing;Throbbing   lasts 30 seconds   Pain Frequency  Intermittent    Aggravating Factors   sit to to stand  getting out of bed  not always  1- 2 X a day    Pain Relieving Factors  being careful with not twisting when getting up.                        [facility] Adult PT Treatment/Exercise - 02/02/18 0001      High Level Balance   High Level Balance Comments  in parallel bars.  forward/ back/,  side steps each way,  high march,.  balance  exercises in corner on floor and on pillow static and dynamic eyes open  ,  reaching up/ out side to side.  also in bars heel walk and tip toe walk,   SBA for balance exercise.  Wide and narrowed base.   noticed her muscles working harder on the leftt     Knee/Hip Exercises: Aerobic   Recumbent Bike  --   341 steps   Nustep  L5 X 10 minutes to work on endurance.      Knee/Hip Exercises: Sidelying   Hip ABduction  1 set;Both    Hip ABduction Limitations  cues    Clams  10 xeach  cues             PT Education - 02/02/18 1245    Education Details  exercise form    Person(s) Educated  Patient    Methods  Explanation    Comprehension  Verbalized understanding;Returned demonstration       PT Short Term Goals - 01/07/18 1202      PT SHORT TERM GOAL #1   Title  pt will be I with HEP    Time  4    Period  Weeks    Status  Achieved      PT SHORT TERM GOAL #2   Title  Pt will demonstrate decreased edema in LLE to be equal to RLE (46cm measured 10 deg distal to tibial tuberosity)    Time  4    Period  Weeks    Status  Achieved        PT Long Term Goals - 02/02/18 1250      PT LONG TERM GOAL #1   Title  Pt will demonstrate increased L knee flexion AROM to >/= 105 degrees to improve functional mobility and improve gait mechanics    Baseline  not measured,  functionally demonstrates about 90 degrees flexion    Time  8    Period  Weeks    Status  On-going      PT LONG TERM GOAL #2   Title  Pt will achieve 0 degrees of extension to improve functional mobility and improve gait mechanics    Time  8    Period  Weeks    Status  Unable to assess      PT LONG TERM GOAL #3   Title  Pt will be able to stand and walk >/= 30  min with LRAD and </= 2/10 pain in order to be able to perform work related duties     Baseline  Able to do in  clinic without pain    Time  8    Period  Weeks    Status  Achieved      PT LONG TERM GOAL #4   Title  Pt will decrease FOTO score to </= 56%  limited to improve function an quality of life    Time  8    Period  Weeks    Status  Unable to assess      PT LONG TERM GOAL #5   Title  Pt will be I with HEP by final visit to ensure progress beyond discharge from physical therapy    Baseline  doing the exercises ,  trying to build endurance in prep of return to work    Time  8    Period  Weeks    Status  Partially Met            Plan - 02/02/18 1246    Clinical Impression Statement  Patient able to exercise non stop with 2 brief sitting rests.  Endurance improving.   Main focus today balance.  (More challanging on the fluffy PEDS gym pillows. )  No pain at the end of session.   LTG#3 met,  LTG#4 partially met.    PT Next Visit Plan  1 more visit prior to return to work.   Remember KX modifier.     PT Home Exercise Plan  heel slides, SAQs, seated hamstring stretch,  hamstring curl, step ups, corner balance    Consulted and Agree with Plan of Care  Patient       Patient will benefit from skilled therapeutic intervention in order to improve the following deficits and impairments:     Visit Diagnosis: Stiffness of left knee, not elsewhere classified  Other abnormalities of gait and mobility  Chronic pain of left knee     Problem List Patient Active Problem List   Diagnosis Date Noted  . B12 deficiency 01/08/2018  . Vitamin D deficiency 01/08/2018  . History of pulmonary embolism 01/08/2018  . Status post total left knee replacement 10/13/2017  . Family history of thyroid disease 08/25/2017  . Chronic pain of left knee 05/20/2017  . Unilateral primary osteoarthritis, left knee 05/20/2017  . Morbid obesity (HCC) 04/30/2017  . Adnexal mass 02/17/2017  . Family history of ovarian cancer 02/17/2017  . IBS (irritable bowel syndrome) 01/21/2017  . Paroxysmal atrial fibrillation (HCC) 06/18/2016  . Type 2 diabetes mellitus without complication, without long-term current use of insulin (HCC) 01/21/2016  . H/O Spinal  surgery 12/21/2015  . Barrett's esophagus 12/21/2015  . Hx of adenomatous polyp of colon 03/01/2002    Dace Denn  PTA 02/02/2018, 12:53 PM  [facility] [address] [place], [place], [address] Phone: [phone]   Fax:  [phone]  Name: ELYSSIA DIAB MRN: [id number] Date of Birth: [birth year]

## 2018-02-04 ENCOUNTER — Encounter: Payer: Self-pay | Admitting: Physical Therapy

## 2018-02-04 ENCOUNTER — Ambulatory Visit: Payer: No Typology Code available for payment source | Admitting: Physical Therapy

## 2018-02-04 DIAGNOSIS — R2689 Other abnormalities of gait and mobility: Secondary | ICD-10-CM

## 2018-02-04 DIAGNOSIS — M25662 Stiffness of left knee, not elsewhere classified: Secondary | ICD-10-CM

## 2018-02-04 DIAGNOSIS — G8929 Other chronic pain: Secondary | ICD-10-CM

## 2018-02-04 DIAGNOSIS — M25562 Pain in left knee: Secondary | ICD-10-CM

## 2018-02-04 NOTE — Therapy (Signed)
[place], [place], [address] Phone: [phone]   Fax:  [phone]  Physical Therapy Treatment / Discharge Summary  Patient Details  Name: [name] MRN: [id number] Date of Birth: 05-26-1950 Referring Provider (PT): Pete Pelt, [place]   Encounter Date: 02/04/2018  PT End of Session - 02/04/18 1145    Visit Number  18    Number of Visits  23    Date for PT Re-Evaluation  02/18/18    Authorization Type  medicare: KX md by 15th visit, progress note by 20th visit    PT Start Time  1145    PT Stop Time  1225    PT Time Calculation (min)  40 min    Activity Tolerance  Patient tolerated treatment well    Behavior During Therapy  [facility] for tasks assessed/performed       Past Medical History:  Diagnosis Date  . Abnormal thyroid function test   . Allergy   . Anemia    she attributes previously to fibroids, she declines  . Barrett's esophagus   . Blood transfusion without reported diagnosis   . DDD (degenerative disc disease), cervical   . DJD (degenerative joint disease)   . GERD (gastroesophageal reflux disease)   . Heart murmur   . History of hiatal hernia   . Hx of adenomatous polyp of colon 03/01/2002  . OA (osteoarthritis)   . Obese   . Paroxysmal atrial fibrillation (HCC)   . PE (pulmonary embolism) 12/21/2015  . Plantar fasciitis   . PONV (postoperative nausea and vomiting)    pt. reports that she woke up during surgery  . Pre-diabetes     Past Surgical History:  Procedure Laterality Date  . ABDOMINAL HYSTERECTOMY    . Arthroscopic surgery left knee    . BACK SURGERY     2005 ,  2017  . CESAREAN SECTION    . CHOLECYSTECTOMY    . COLONOSCOPY  2003, 2011   3 mm adenoma 2011  . ESOPHAGOGASTRODUODENOSCOPY  multiple since 2003  . IR GENERIC HISTORICAL  12/19/2015   IR ANGIOGRAM SELECTIVE EACH ADDITIONAL VESSEL 12/19/2015 Greggory Keen, MD [facility]  . IR GENERIC HISTORICAL   12/19/2015   IR ANGIOGRAM PULMONARY BILATERAL SELECTIVE 12/19/2015 Greggory Keen, MD [facility]  . IR GENERIC HISTORICAL  12/19/2015   IR INFUSION THROMBOL ARTERIAL INITIAL (MS) 12/19/2015 Greggory Keen, MD [facility]  . IR GENERIC HISTORICAL  12/19/2015   IR US GUIDE VASC ACCESS RIGHT 12/19/2015 Greggory Keen, MD [facility]  . IR GENERIC HISTORICAL  12/19/2015   IR ANGIOGRAM SELECTIVE EACH ADDITIONAL VESSEL 12/19/2015 Greggory Keen, MD [facility]  . IR GENERIC HISTORICAL  12/20/2015   IR THROMB F/U EVAL ART/VEN FINAL DAY (MS) 12/20/2015 Corrie Mckusick, DO [facility]  . IR GENERIC HISTORICAL  12/19/2015   IR INFUSION THROMBOL ARTERIAL INITIAL (MS) 12/19/2015 Greggory Keen, MD [facility]  . PLANTAR FASCIA RELEASE     right  . Right knee replacement    . SPINAL FUSION  10/31/2015   revision at [place]    . TOTAL KNEE ARTHROPLASTY Left 10/13/2017  . TOTAL KNEE ARTHROPLASTY Left 10/13/2017   Procedure: LEFT TOTAL KNEE ARTHROPLASTY;  Surgeon: Mcarthur Rossetti, MD;  Location: [facility];  Service: Orthopedics;  Laterality: Left;  . ULNAR TUNNEL RELEASE     right  . VENOUS ABLATION     x 2  history of thyroid disease 08/25/2017  . Chronic pain of left knee 05/20/2017  . Unilateral primary osteoarthritis, left knee 05/20/2017  . Morbid obesity ([place]) 04/30/2017  . Adnexal mass 02/17/2017  . Family history of ovarian cancer 02/17/2017  . IBS (irritable bowel syndrome) 01/21/2017  . Paroxysmal atrial fibrillation ([place]) 06/18/2016  . Type 2 diabetes mellitus without complication, without long-term current use of insulin ([place]) 01/21/2016  . H/O Spinal surgery 12/21/2015  . Barrett's esophagus 12/21/2015  . Hx of adenomatous polyp of colon 03/01/2002   [name] PT, DPT, LAT, ATC  02/04/18  12:33 PM      [facility] [address] [place], [place], [address] Phone: [phone]   Fax:  [phone]  Name: WILLEEN NOVAK MRN: [id number] Date of Birth: 1950/07/22       PHYSICAL THERAPY DISCHARGE SUMMARY  Visits from Start of Care: 18  Current functional level related to goals / functional outcomes: See goals, FOTO 50% limited   Remaining deficits: Limited knee ROM with flexion/ extension that fluctuates depending on swelling.    Education / Equipment: HEP, theraband, posture, HEP  Plan: Patient agrees to discharge.  Patient goals were partially met. Patient is being discharged due to being pleased with the current functional level.  ?????         Braeley Buskey PT, DPT, LAT, ATC  02/04/18  12:34 PM  history of thyroid disease 08/25/2017  . Chronic pain of left knee 05/20/2017  . Unilateral primary osteoarthritis, left knee 05/20/2017  . Morbid obesity ([place]) 04/30/2017  . Adnexal mass 02/17/2017  . Family history of ovarian cancer 02/17/2017  . IBS (irritable bowel syndrome) 01/21/2017  . Paroxysmal atrial fibrillation ([place]) 06/18/2016  . Type 2 diabetes mellitus without complication, without long-term current use of insulin ([place]) 01/21/2016  . H/O Spinal surgery 12/21/2015  . Barrett's esophagus 12/21/2015  . Hx of adenomatous polyp of colon 03/01/2002   [name] PT, DPT, LAT, ATC  02/04/18  12:33 PM      [facility] [address] [place], [place], [address] Phone: [phone]   Fax:  [phone]  Name: WILLEEN NOVAK MRN: [id number] Date of Birth: 1950/07/22       PHYSICAL THERAPY DISCHARGE SUMMARY  Visits from Start of Care: 18  Current functional level related to goals / functional outcomes: See goals, FOTO 50% limited   Remaining deficits: Limited knee ROM with flexion/ extension that fluctuates depending on swelling.    Education / Equipment: HEP, theraband, posture, HEP  Plan: Patient agrees to discharge.  Patient goals were partially met. Patient is being discharged due to being pleased with the current functional level.  ?????         Braeley Buskey PT, DPT, LAT, ATC  02/04/18  12:34 PM  [place], [place], [address] Phone: [phone]   Fax:  [phone]  Physical Therapy Treatment / Discharge Summary  Patient Details  Name: [name] MRN: [id number] Date of Birth: 05-26-1950 Referring Provider (PT): Pete Pelt, [place]   Encounter Date: 02/04/2018  PT End of Session - 02/04/18 1145    Visit Number  18    Number of Visits  23    Date for PT Re-Evaluation  02/18/18    Authorization Type  medicare: KX md by 15th visit, progress note by 20th visit    PT Start Time  1145    PT Stop Time  1225    PT Time Calculation (min)  40 min    Activity Tolerance  Patient tolerated treatment well    Behavior During Therapy  [facility] for tasks assessed/performed       Past Medical History:  Diagnosis Date  . Abnormal thyroid function test   . Allergy   . Anemia    she attributes previously to fibroids, she declines  . Barrett's esophagus   . Blood transfusion without reported diagnosis   . DDD (degenerative disc disease), cervical   . DJD (degenerative joint disease)   . GERD (gastroesophageal reflux disease)   . Heart murmur   . History of hiatal hernia   . Hx of adenomatous polyp of colon 03/01/2002  . OA (osteoarthritis)   . Obese   . Paroxysmal atrial fibrillation (HCC)   . PE (pulmonary embolism) 12/21/2015  . Plantar fasciitis   . PONV (postoperative nausea and vomiting)    pt. reports that she woke up during surgery  . Pre-diabetes     Past Surgical History:  Procedure Laterality Date  . ABDOMINAL HYSTERECTOMY    . Arthroscopic surgery left knee    . BACK SURGERY     2005 ,  2017  . CESAREAN SECTION    . CHOLECYSTECTOMY    . COLONOSCOPY  2003, 2011   3 mm adenoma 2011  . ESOPHAGOGASTRODUODENOSCOPY  multiple since 2003  . IR GENERIC HISTORICAL  12/19/2015   IR ANGIOGRAM SELECTIVE EACH ADDITIONAL VESSEL 12/19/2015 Greggory Keen, MD [facility]  . IR GENERIC HISTORICAL   12/19/2015   IR ANGIOGRAM PULMONARY BILATERAL SELECTIVE 12/19/2015 Greggory Keen, MD [facility]  . IR GENERIC HISTORICAL  12/19/2015   IR INFUSION THROMBOL ARTERIAL INITIAL (MS) 12/19/2015 Greggory Keen, MD [facility]  . IR GENERIC HISTORICAL  12/19/2015   IR US GUIDE VASC ACCESS RIGHT 12/19/2015 Greggory Keen, MD [facility]  . IR GENERIC HISTORICAL  12/19/2015   IR ANGIOGRAM SELECTIVE EACH ADDITIONAL VESSEL 12/19/2015 Greggory Keen, MD [facility]  . IR GENERIC HISTORICAL  12/20/2015   IR THROMB F/U EVAL ART/VEN FINAL DAY (MS) 12/20/2015 Corrie Mckusick, DO [facility]  . IR GENERIC HISTORICAL  12/19/2015   IR INFUSION THROMBOL ARTERIAL INITIAL (MS) 12/19/2015 Greggory Keen, MD [facility]  . PLANTAR FASCIA RELEASE     right  . Right knee replacement    . SPINAL FUSION  10/31/2015   revision at [place]    . TOTAL KNEE ARTHROPLASTY Left 10/13/2017  . TOTAL KNEE ARTHROPLASTY Left 10/13/2017   Procedure: LEFT TOTAL KNEE ARTHROPLASTY;  Surgeon: Mcarthur Rossetti, MD;  Location: [facility];  Service: Orthopedics;  Laterality: Left;  . ULNAR TUNNEL RELEASE     right  . VENOUS ABLATION     x 2

## 2018-03-02 ENCOUNTER — Ambulatory Visit: Payer: No Typology Code available for payment source | Admitting: Internal Medicine

## 2018-03-02 ENCOUNTER — Encounter: Payer: Self-pay | Admitting: Internal Medicine

## 2018-03-02 VITALS — BP 128/70 | HR 90 | Ht 62.0 in | Wt 256.0 lb

## 2018-03-02 DIAGNOSIS — Z23 Encounter for immunization: Secondary | ICD-10-CM

## 2018-03-02 DIAGNOSIS — E119 Type 2 diabetes mellitus without complications: Secondary | ICD-10-CM | POA: Diagnosis not present

## 2018-03-02 NOTE — Patient Instructions (Signed)
Please come back for a follow-up appointment in 6 months.   

## 2018-03-02 NOTE — Progress Notes (Signed)
Patient ID: [name], female   DOB: [birth year], 67 y.o.   MRN: [id number]    HPI  [name] is a 67 y.o.-year-old female, returning for f/u for DM2, mild, non-insulin-dependent, without long term complications. His husband is also my pt, [name]. Last visit 6 months ago.  She had L TKR in 09/2017. She just finished PT. On Robaxin and Vicodin prn, along with Advil.  She returned to work 1 week ago.  She is on a reduced schedule.  DM2: - diet-controlled  HbA1c levels: Lab Results  Component Value Date   HGBA1C 6.8 (H) 01/08/2018   HGBA1C 6.2 (H) 10/01/2017   HGBA1C 6.1 08/25/2017   HGBA1C 6.5 01/07/2017   HGBA1C 6.3 (H) 09/25/2016   HGBA1C 6.4 08/21/2016   HGBA1C 6.6 (H) 02/22/2016   HGBA1C 6.9 (H) 09/24/2015   Prev.: 5.9%  She checks CBG 1x a day: - am: 72, 101-110 >> 91, 92 >> 70-120 - 2h after b'fast: n/c - lunch: n/c - 2h after lunch: 126 >> 130-135 - dinner: n/c - 2h after dinner: n/c (dinner late: gets off work at 7:30) >> 62 >> 130-135 - bedtime: n/c  Diet: - Breakfast: Sausage biscuit - Lunch: Meat loaf, fish, or pasta - Dinner: Small - Snacks: 1 a day: String cheese or piece of candy, 100-calorie snack  - no CKD: Lab Results  Component Value Date   BUN 14 01/08/2018   Lab Results  Component Value Date   CREATININE 0.79 01/08/2018  Not on an ACEI/ARB.  - + HL: Lab Results  Component Value Date   CHOL 194 01/08/2018   HDL 50 (L) 01/08/2018   LDLCALC 123 (H) 01/08/2018   TRIG 103 01/08/2018   CHOLHDL 3.9 01/08/2018  Not on a statin.  - last eye exam: 11/2015: No DR - no numbness tingling in feet  On ASA 81.  She has a family history of thyroid disease, but investigation at last visit was negative for hypothyroidism or Hashimoto's thyroiditis: Lab Results  Component Value Date   TSH 2.01 01/08/2018   TSH 2.470 09/25/2016   TSH 1.99 02/22/2016   TSH 3.380 09/24/2015   Lab Results  Component Value Date   FREET4 0.76  02/22/2016   T3FREE 3.5 02/22/2016   Thyroid Ab's normal: Component     Latest Ref Rng & Units 02/22/2016  Thyroglobulin Ab     <2 IU/mL <1  Thyroperoxidase Ab SerPl-aCnc     <9 IU/mL 1   She has a history of Barrett's esophagus. In 12/19/2015 she had a saddle PE. She was on Eliquis >> stopped [name] 2018.Marland Kitchen She also has a history of back surgery 10/31/2015. Previous back surgery in 2005. She had multiple courses of steroids, not recently.  ROS: Constitutional: no weight gain/+ weight loss, no fatigue, no subjective hyperthermia, no subjective hypothermia Eyes: no blurry vision, no xerophthalmia ENT: no sore throat, no nodules palpated in neck, no dysphagia, no odynophagia, no hoarseness Cardiovascular: no CP/no SOB/no palpitations/no leg swelling Respiratory: no cough/no SOB/no wheezing Gastrointestinal: no N/no V/no D/no C/no acid reflux Musculoskeletal: no muscle aches/+ joint aches Skin: no rashes, no hair loss Neurological: no tremors/no numbness/no tingling/no dizziness  I reviewed pt's medications, allergies, PMH, social hx, family hx, and changes were documented in the history of present illness. Otherwise, unchanged from my initial visit note.  Past Medical History:  Diagnosis Date  . Abnormal thyroid function test   . Allergy   . Anemia  she attributes previously to fibroids, she declines  . Barrett's esophagus   . Blood transfusion without reported diagnosis   . DDD (degenerative disc disease), cervical   . DJD (degenerative joint disease)   . GERD (gastroesophageal reflux disease)   . Heart murmur   . History of hiatal hernia   . Hx of adenomatous polyp of colon 03/01/2002  . OA (osteoarthritis)   . Obese   . Paroxysmal atrial fibrillation (HCC)   . PE (pulmonary embolism) 12/21/2015  . Plantar fasciitis   . PONV (postoperative nausea and vomiting)    pt. reports that she woke up during surgery  . Pre-diabetes    Past Surgical History:  Procedure  Laterality Date  . ABDOMINAL HYSTERECTOMY    . Arthroscopic surgery left knee    . BACK SURGERY     2005 ,  2017  . CESAREAN SECTION    . CHOLECYSTECTOMY    . COLONOSCOPY  2003, 2011   3 mm adenoma 2011  . ESOPHAGOGASTRODUODENOSCOPY  multiple since 2003  . IR GENERIC HISTORICAL  12/19/2015   IR ANGIOGRAM SELECTIVE EACH ADDITIONAL VESSEL 12/19/2015 [name], MD [facility]  . IR GENERIC HISTORICAL  12/19/2015   IR ANGIOGRAM PULMONARY BILATERAL SELECTIVE 12/19/2015 [name], MD [facility]  . IR GENERIC HISTORICAL  12/19/2015   IR INFUSION THROMBOL ARTERIAL INITIAL (MS) 12/19/2015 [name], MD [facility]  . IR GENERIC HISTORICAL  12/19/2015   IR US GUIDE VASC ACCESS RIGHT 12/19/2015 [name], MD [facility]  . IR GENERIC HISTORICAL  12/19/2015   IR ANGIOGRAM SELECTIVE EACH ADDITIONAL VESSEL 12/19/2015 [name], MD [facility]  . IR GENERIC HISTORICAL  12/20/2015   IR THROMB F/U EVAL ART/VEN FINAL DAY (MS) 12/20/2015 Gilmer Mor, DO [facility]  . IR GENERIC HISTORICAL  12/19/2015   IR INFUSION THROMBOL ARTERIAL INITIAL (MS) 12/19/2015 [name], MD [facility]  . PLANTAR FASCIA RELEASE     right  . Right knee replacement    . SPINAL FUSION  10/31/2015   revision at [facility]   . TONSILLECTOMY    . TOTAL KNEE ARTHROPLASTY Left 10/13/2017  . TOTAL KNEE ARTHROPLASTY Left 10/13/2017   Procedure: LEFT TOTAL KNEE ARTHROPLASTY;  Surgeon: Kathryne Hitch, MD;  Location: [facility] OR;  Service: Orthopedics;  Laterality: Left;  . ULNAR TUNNEL RELEASE     right  . VENOUS ABLATION     x 2   Social History   Socioeconomic History  . Marital status: Married    Spouse name: Not on file  . Number of children: 3  . Years of education: Not on file  . Highest education level: Not on file  Occupational History    Comment: Telephone sales  Social Needs  . Financial resource strain: Not on file  . Food insecurity:    Worry: Not on file     Inability: Not on file  . Transportation needs:    Medical: Not on file    Non-medical: Not on file  Tobacco Use  . Smoking status: Never Smoker  . Smokeless tobacco: Never Used  Substance and Sexual Activity  . Alcohol use: No  . Drug use: No  . Sexual activity: Not Currently    Partners: Male    Birth control/protection: Surgical    Comment: Hysterectomy  Lifestyle  . Physical activity:    Days per week: Not on file    Minutes per session: Not on file  . Stress: Not on  file  Relationships  . Social connections:    Talks on phone: Not on file    Gets together: Not on file    Attends religious service: Not on file    Active member of club or organization: Not on file    Attends meetings of clubs or organizations: Not on file    Relationship status: Not on file  . Intimate partner violence:    Fear of current or ex partner: Not on file    Emotionally abused: Not on file    Physically abused: Not on file    Forced sexual activity: Not on file  Other Topics Concern  . Not on file  Social History Narrative   Married.   3 children, 2 grandchildren.   Work's in a call center.   Enjoys reading, sewing, spending time with her grandchildren.   Current Outpatient Medications on File Prior to Visit  Medication Sig Dispense Refill  . aspirin EC 81 MG tablet Take 81 mg by mouth 2 (two) times daily at 10 AM and 5 PM.    . Cholecalciferol (VITAMIN D-3) 1000 units CAPS Take 1,000 Units by mouth every evening.     . [employer] LUBRICANT DROPS 1 % GEL Apply 1 drop to both eyes daily as needed for allergic pruritis.  12  . HYDROcodone-acetaminophen (NORCO) 5-325 MG tablet Take 1-2 tablets by mouth every 6 (six) hours as needed for moderate pain. One to two tabs every 4-6 hours for pain 40 tablet 0  . MAGNESIUM PO Take 1 tablet by mouth daily as needed (MAGNESIUM SUPPLEMENTATION.).    Marland Kitchen methocarbamol (ROBAXIN) 500 MG tablet Take 1 tablet (500 mg total) by mouth every 6 (six) hours as needed for  muscle spasms. 50 tablet 1  . pantoprazole (PROTONIX) 40 MG tablet Take 1 tablet (40 mg total) by mouth daily before breakfast. 90 tablet 3  . POTASSIUM PO Take 1 tablet by mouth daily as needed (POTASSIUM SUPPLEMENT.).     No current facility-administered medications on file prior to visit.    Allergies  Allergen Reactions  . Ciprofloxacin Anaphylaxis  . Doxycycline Anaphylaxis  . Strawberry Extract Anaphylaxis  . Tetracyclines & Related Anaphylaxis   Family History  Problem Relation Age of Onset  . Stroke Maternal Grandmother   . Bladder Cancer Maternal Grandmother 50  . Esophageal cancer Maternal Grandmother 50  . Hyperthyroidism Mother        Radioactive Iodine Treatment  . Hypothyroidism Mother   . Cervical cancer Maternal Aunt 50  . Ovarian cancer Daughter 57  . Colon cancer Neg Hx   . Pancreatic cancer Neg Hx   . Rectal cancer Neg Hx   . Stomach cancer Neg Hx   . Diabetes Neg Hx     PE: BP 128/70   Pulse 90   Ht 5\' 2"  (1.575 m) Comment: measured  Wt 256 lb (116.1 kg)   SpO2 97%   BMI 46.82 kg/m  Wt Readings from Last 3 Encounters:  03/02/18 256 lb (116.1 kg)  01/08/18 247 lb 8 oz (112.3 kg)  10/15/17 263 lb 10.7 oz (119.6 kg)   Constitutional: overweight, in NAD Eyes: PERRLA, EOMI, no exophthalmos ENT: moist mucous membranes, no thyromegaly, no cervical lymphadenopathy Cardiovascular: RRR, No MRG Respiratory: CTA B Gastrointestinal: abdomen soft, NT, ND, BS+ Musculoskeletal: no deformities, strength intact in all 4 Skin: moist, warm, no rashes Neurological: no tremor with outstretched hands, DTR normal in all 4  ASSESSMENT: 1. Non-insulin diabetes type 2, w/o  long term complication  2.  Obesity class III  3. HL   PLAN:  1. Non-insulin diabetes type 2, without long term complications, diet-controlled - Recent HbA1c was higher, at 6.8% - she usually has slightly elevated HbA1c, fluctuating around the threshold between prediabetes and diabetes - At  this visit, sugars appear to have improved since September, so we can continue now without treatment - continue checking sugars at different times of the day - check 1x a day, rotating checks - advised for yearly eye exams >> she is not UTD, she needs to schedule this - Return to clinic in 6 mo with sugar log.  We will check her new HbA1c then.  2.  Obesity class III - lost ~15 lbs over the summer, however, she gained back 9 pounds in the last 2 months - She needs to continue exercise at home now that she finished physical therapy  3. HL - Reviewed latest lipid panel from 12/2017: LDL high, HDL low Lab Results  Component Value Date   CHOL 194 01/08/2018   HDL 50 (L) 01/08/2018   LDLCALC 123 (H) 01/08/2018   TRIG 103 01/08/2018   CHOLHDL 3.9 01/08/2018  - she is not on a statin, previously lipid panel was normal.  I suspect that this is due to inactivity after her TKR.  She is now slowly resuming activity.  Carlus Pavlov, MD PhD [facility]

## 2018-04-27 ENCOUNTER — Ambulatory Visit (INDEPENDENT_AMBULATORY_CARE_PROVIDER_SITE_OTHER): Payer: No Typology Code available for payment source | Admitting: Orthopaedic Surgery

## 2018-05-25 ENCOUNTER — Ambulatory Visit (INDEPENDENT_AMBULATORY_CARE_PROVIDER_SITE_OTHER): Payer: No Typology Code available for payment source | Admitting: Orthopaedic Surgery

## 2018-05-25 ENCOUNTER — Ambulatory Visit (INDEPENDENT_AMBULATORY_CARE_PROVIDER_SITE_OTHER): Payer: No Typology Code available for payment source

## 2018-05-25 ENCOUNTER — Encounter (INDEPENDENT_AMBULATORY_CARE_PROVIDER_SITE_OTHER): Payer: Self-pay | Admitting: Orthopaedic Surgery

## 2018-05-25 DIAGNOSIS — Z96652 Presence of left artificial knee joint: Secondary | ICD-10-CM | POA: Diagnosis not present

## 2018-05-25 NOTE — Progress Notes (Signed)
Patient is a very pleasant 68 year old obese female who has had a history of bilateral knee replacements with the left one we did in June 2019.  The right was done remotely.  She is doing much better overall.  She still has problems of not getting down on her knees well.  She also has numbness in the left knee.  Overall though she feels like her range of motion and strength are slowly getting better.  She said balance is still an issue.  On examination both knees move well.  There is no effusion of either knee.  They both feel ligamentously stable.  Both knees have well-healed surgical incisions.  An AP view of both knees and a lateral of the left more recent total knee arthroplasty shows well-seated implants with no complicating features.  At this point she is doing well enough that we can have her follow-up as needed.  If there is any issues at all with the knees at any time she will let us know.  All question concerns were answered and addressed.

## 2018-08-31 ENCOUNTER — Ambulatory Visit: Payer: Self-pay | Admitting: Internal Medicine

## 2018-12-01 ENCOUNTER — Encounter (INDEPENDENT_AMBULATORY_CARE_PROVIDER_SITE_OTHER): Payer: Self-pay | Admitting: Family Medicine

## 2018-12-01 ENCOUNTER — Telehealth: Payer: PRIVATE HEALTH INSURANCE | Admitting: Family

## 2018-12-01 DIAGNOSIS — B9789 Other viral agents as the cause of diseases classified elsewhere: Secondary | ICD-10-CM | POA: Diagnosis not present

## 2018-12-01 DIAGNOSIS — J329 Chronic sinusitis, unspecified: Secondary | ICD-10-CM

## 2018-12-01 MED ORDER — NEOMYCIN-POLYMYXIN-HC 3.5-10000-1 OT SOLN: 4.0000 [drp] | Freq: Four times a day (QID) | OTIC | 0 refills | Status: DC

## 2018-12-01 MED ORDER — FLUTICASONE PROPIONATE 50 MCG/ACT NA SUSP: 2.0000 | Freq: Every day | NASAL | 6 refills | Status: DC

## 2018-12-01 NOTE — Progress Notes (Signed)
We are sorry that you are not feeling well.  Here is how we plan to help!  Based on what you have shared with me it looks like you have sinusitis.  Sinusitis is inflammation and infection in the sinus cavities of the head.  Based on your presentation I believe you most likely have Acute Viral Sinusitis.This is an infection most likely caused by a virus. There is not specific treatment for viral sinusitis other than to help you with the symptoms until the infection runs its course.  You may use an oral decongestant such as Mucinex D or if you have glaucoma or high blood pressure use plain Mucinex. Saline nasal spray help and can safely be used as often as needed for congestion, I have prescribed: Fluticasone nasal spray two sprays in each nostril once a day.   Approximately 5 minutes was spent documenting and reviewing patient's chart.   Given that your symptoms started over the last 3 days, we do not treat with antibiotics. If your symptoms worsen or do not improve, we can send something in. You would also benefit from COVID testing. You can go to one of the  testing sites listed below, while they are opened (see hours). You do not need an order and will stay in your car during the test. You do need to self isolate until your results return and if positive 14 days from when your symptoms started and until you are 3 days symptom free.   Testing Locations (Monday - Friday, 8 a.m. - 3:30 p.m.) . [place]: [facility], [address] [place] [address], [place], [place]: [place], [place], [place], [place] (entrance off [address])  . [place] [address], [place], [place] (across from [employer])   Some authorities believe that zinc sprays or the use of Echinacea may shorten the course of your symptoms.  Sinus infections are not as easily transmitted as other respiratory infection, however we still  recommend that you avoid close contact with loved ones, especially the very young and elderly.  Remember to wash your hands thoroughly throughout the day as this is the number one way to prevent the spread of infection!  Home Care:  Only take medications as instructed by your medical team.  Do not take these medications with alcohol.  A steam or ultrasonic humidifier can help congestion.  You can place a towel over your head and breathe in the steam from hot water coming from a faucet.  Avoid close contacts especially the very young and the elderly.  Cover your mouth when you cough or sneeze.  Always remember to wash your hands.  Get Help Right Away If:  You develop worsening fever or sinus pain.  You develop a severe head ache or visual changes.  Your symptoms persist after you have completed your treatment plan.  Make sure you  Understand these instructions.  Will watch your condition.  Will get help right away if you are not doing well or get worse.  Your e-visit answers were reviewed by a board certified advanced clinical practitioner to complete your personal care plan.  Depending on the condition, your plan could have included both over the counter or prescription medications.  If there is a problem please reply  once you have received a response from your provider.  Your safety is important to [place].  If you have drug allergies check your prescription carefully.  You can use MyChart to ask questions about today's visit, request a non-urgent call back, or ask for a work or school excuse for 24 hours related to this e-Visit. If it has been greater than 24 hours you will need to follow up with your provider, or enter a new e-Visit to address those concerns.  You will get an e-mail in the next two days asking about your experience.  I hope that your e-visit has been valuable and will speed your recovery. Thank you for using e-visits.

## 2018-12-20 ENCOUNTER — Other Ambulatory Visit: Payer: Self-pay

## 2018-12-21 ENCOUNTER — Ambulatory Visit: Payer: No Typology Code available for payment source | Admitting: Internal Medicine

## 2018-12-21 ENCOUNTER — Encounter: Payer: Self-pay | Admitting: Internal Medicine

## 2018-12-21 VITALS — BP 118/80 | HR 84 | Ht 60.75 in | Wt 263.0 lb

## 2018-12-21 DIAGNOSIS — E119 Type 2 diabetes mellitus without complications: Secondary | ICD-10-CM

## 2018-12-21 DIAGNOSIS — E782 Mixed hyperlipidemia: Secondary | ICD-10-CM

## 2018-12-21 LAB — POCT GLYCOSYLATED HEMOGLOBIN (HGB A1C): Hemoglobin A1C: 6.5 % — AB (ref 4.0–5.6)

## 2018-12-21 NOTE — Patient Instructions (Signed)
Please come back for a follow-up appointment in 6 months.   

## 2018-12-21 NOTE — Progress Notes (Signed)
Patient ID: [name], female   DOB: 24-Aug-1950, 68 y.o.   MRN: [id number]    HPI  [name] is a 68 y.o.-year-old female, returning for f/u for DM2, mild, non-insulin-dependent, without long term complications. His husband is also my pt, [name]. Last visit 9 months ago.  She is working from home.  DM2: -Diet controlled  Reviewed the most recent HbA1c levels: Lab Results  Component Value Date   HGBA1C 6.8 (H) 01/08/2018   HGBA1C 6.2 (H) 10/01/2017   HGBA1C 6.1 08/25/2017   HGBA1C 6.5 01/07/2017   HGBA1C 6.3 (H) 09/25/2016   HGBA1C 6.4 08/21/2016   HGBA1C 6.6 (H) 02/22/2016   HGBA1C 6.9 (H) 09/24/2015   Prev.: 5.9%  She checks her sugars once a day: - am: 72, 101-110 >> 91, 92 >> 70-120 >> 70-120, 188 (?) - 2h after b'fast: n/c - lunch: n/c - 2h after lunch: 126 >> 130-135 >> 128-130 - dinner: n/c - 2h after dinner: n/c >> 62 >> 130-135 >> 128-130s - bedtime: n/c Highest: 188 x1 (possible dehydration)  Diet: - Breakfast: Sausage biscuit - Lunch: Meat loaf, fish, or pasta - Dinner: Small - Snacks: 1 a day: String cheese or piece of candy, 100-calorie snack  -No CKD: Lab Results  Component Value Date   BUN 14 01/08/2018   Lab Results  Component Value Date   CREATININE 0.79 01/08/2018  Not on an ACE inhibitor/ARB.  -+ HL: Lab Results  Component Value Date   CHOL 194 01/08/2018   HDL 50 (L) 01/08/2018   LDLCALC 123 (H) 01/08/2018   TRIG 103 01/08/2018   CHOLHDL 3.9 01/08/2018  Not on a statin.  - last eye exam: 11/2015: No DR -No numbness tingling in feet  On ASA 81.  She has a family history of thyroid disease, but her investigation for this was negative: Lab Results  Component Value Date   TSH 2.01 01/08/2018   TSH 2.470 09/25/2016   TSH 1.99 02/22/2016   TSH 3.380 09/24/2015   Lab Results  Component Value Date   FREET4 0.76 02/22/2016   T3FREE 3.5 02/22/2016   Thyroid Ab's normal: Component     Latest Ref Rng &  Units 02/22/2016  Thyroglobulin Ab     <2 IU/mL <1  Thyroperoxidase Ab SerPl-aCnc     <9 IU/mL 1   She has a history of Barrett's esophagus. In 12/19/2015 she had a saddle PE. She was on Eliquis >> stopped Fall 2018. She also has a history of back surgery 10/31/2015. Previous back surgery in 2005. She had multiple courses of steroids, not recently. She had L TKR in 09/2017.  She started lymphedema treatment.   ROS: Constitutional: + weight gain/no weight loss, no fatigue, no subjective hyperthermia, no subjective hypothermia Eyes: no blurry vision, no xerophthalmia ENT: no sore throat, no nodules palpated in neck, no dysphagia, no odynophagia, no hoarseness Cardiovascular: no CP/no SOB/no palpitations/no leg swelling Respiratory: no cough/no SOB/no wheezing Gastrointestinal: no N/no V/no D/no C/no acid reflux Musculoskeletal: no muscle aches/no joint aches Skin: no rashes, no hair loss Neurological: no tremors/no numbness/no tingling/no dizziness  I reviewed pt's medications, allergies, PMH, social hx, family hx, and changes were documented in the history of present illness. Otherwise, unchanged from my initial visit note.  Past Medical History:  Diagnosis Date  . Abnormal thyroid function test   . Allergy   . Anemia    she attributes previously to fibroids, she declines  . Barrett's esophagus   .  Blood transfusion without reported diagnosis   . DDD (degenerative disc disease), cervical   . DJD (degenerative joint disease)   . GERD (gastroesophageal reflux disease)   . Heart murmur   . History of hiatal hernia   . Hx of adenomatous polyp of colon 03/01/2002  . OA (osteoarthritis)   . Obese   . Paroxysmal atrial fibrillation (HCC)   . PE (pulmonary embolism) 12/21/2015  . Plantar fasciitis   . PONV (postoperative nausea and vomiting)    pt. reports that she woke up during surgery  . Pre-diabetes    Past Surgical History:  Procedure Laterality Date  . ABDOMINAL  HYSTERECTOMY    . Arthroscopic surgery left knee    . BACK SURGERY     2005 ,  2017  . CESAREAN SECTION    . CHOLECYSTECTOMY    . COLONOSCOPY  2003, 2011   3 mm adenoma 2011  . ESOPHAGOGASTRODUODENOSCOPY  multiple since 2003  . IR GENERIC HISTORICAL  12/19/2015   IR ANGIOGRAM SELECTIVE EACH ADDITIONAL VESSEL 12/19/2015 [name], MD [facility]  . IR GENERIC HISTORICAL  12/19/2015   IR ANGIOGRAM PULMONARY BILATERAL SELECTIVE 12/19/2015 [name], MD [facility]  . IR GENERIC HISTORICAL  12/19/2015   IR INFUSION THROMBOL ARTERIAL INITIAL (MS) 12/19/2015 [name], MD [facility]  . IR GENERIC HISTORICAL  12/19/2015   IR US GUIDE VASC ACCESS RIGHT 12/19/2015 [name], MD [facility]  . IR GENERIC HISTORICAL  12/19/2015   IR ANGIOGRAM SELECTIVE EACH ADDITIONAL VESSEL 12/19/2015 [name], MD [facility]  . IR GENERIC HISTORICAL  12/20/2015   IR THROMB F/U EVAL ART/VEN FINAL DAY (MS) 12/20/2015 Gilmer Mor, DO [facility]  . IR GENERIC HISTORICAL  12/19/2015   IR INFUSION THROMBOL ARTERIAL INITIAL (MS) 12/19/2015 [name], MD [facility]  . PLANTAR FASCIA RELEASE     right  . Right knee replacement    . SPINAL FUSION  10/31/2015   revision at [facility]   . TONSILLECTOMY    . TOTAL KNEE ARTHROPLASTY Left 10/13/2017  . TOTAL KNEE ARTHROPLASTY Left 10/13/2017   Procedure: LEFT TOTAL KNEE ARTHROPLASTY;  Surgeon: Kathryne Hitch, MD;  Location: [facility];  Service: Orthopedics;  Laterality: Left;  . ULNAR TUNNEL RELEASE     right  . VENOUS ABLATION     x 2   Social History   Socioeconomic History  . Marital status: Married    Spouse name: Not on file  . Number of children: 3  . Years of education: Not on file  . Highest education level: Not on file  Occupational History    Comment: Telephone sales  Social Needs  . Financial resource strain: Not on file  . Food insecurity    Worry: Not on file    Inability: Not on file  .  Transportation needs    Medical: Not on file    Non-medical: Not on file  Tobacco Use  . Smoking status: Never Smoker  . Smokeless tobacco: Never Used  Substance and Sexual Activity  . Alcohol use: No  . Drug use: No  . Sexual activity: Not Currently    Partners: Male    Birth control/protection: Surgical    Comment: Hysterectomy  Lifestyle  . Physical activity    Days per week: Not on file    Minutes per session: Not on file  . Stress: Not on file  Relationships  . Social Musician on phone: Not  on file    Gets together: Not on file    Attends religious service: Not on file    Active member of club or organization: Not on file    Attends meetings of clubs or organizations: Not on file    Relationship status: Not on file  . Intimate partner violence    Fear of current or ex partner: Not on file    Emotionally abused: Not on file    Physically abused: Not on file    Forced sexual activity: Not on file  Other Topics Concern  . Not on file  Social History Narrative   Married.   3 children, 2 grandchildren.   Work's in a call center.   Enjoys reading, sewing, spending time with her grandchildren.   Current Outpatient Medications on File Prior to Visit  Medication Sig Dispense Refill  . Cholecalciferol (VITAMIN D-3) 1000 units CAPS Take 1,000 Units by mouth every evening.     . [employer] LUBRICANT DROPS 1 % GEL Apply 1 drop to both eyes daily as needed for allergic pruritis.  12  . MAGNESIUM PO Take 1 tablet by mouth daily as needed (MAGNESIUM SUPPLEMENTATION.).    Marland Kitchen neomycin-polymyxin-hydrocortisone (CORTISPORIN) OTIC solution Place 4 drops into the right ear 4 (four) times daily. 10 mL 0  . POTASSIUM PO Take 1 tablet by mouth daily as needed (POTASSIUM SUPPLEMENT.).     No current facility-administered medications on file prior to visit.    Allergies  Allergen Reactions  . Ciprofloxacin Anaphylaxis  . Doxycycline Anaphylaxis  . Strawberry Extract Anaphylaxis  .  Tetracyclines & Related Anaphylaxis   Family History  Problem Relation Age of Onset  . Stroke Maternal Grandmother   . Bladder Cancer Maternal Grandmother 50  . Esophageal cancer Maternal Grandmother 50  . Hyperthyroidism Mother        Radioactive Iodine Treatment  . Hypothyroidism Mother   . Cervical cancer Maternal Aunt 50  . Ovarian cancer Daughter 71  . Colon cancer Neg Hx   . Pancreatic cancer Neg Hx   . Rectal cancer Neg Hx   . Stomach cancer Neg Hx   . Diabetes Neg Hx     PE: BP 118/80   Pulse 84   Ht 5' 0.75" (1.543 m) Comment: measured without shoes  Wt 263 lb (119.3 kg)   SpO2 95%   BMI 50.10 kg/m  Wt Readings from Last 3 Encounters:  12/21/18 263 lb (119.3 kg)  03/02/18 256 lb (116.1 kg)  01/08/18 247 lb 8 oz (112.3 kg)   Constitutional: overweight, in NAD Eyes: PERRLA, EOMI, no exophthalmos ENT: moist mucous membranes, no thyromegaly, no cervical lymphadenopathy Cardiovascular: RRR, No MRG Respiratory: CTA B Gastrointestinal: abdomen soft, NT, ND, BS+ Musculoskeletal: no deformities, strength intact in all 4 Skin: moist, warm, no rashes Neurological: no tremor with outstretched hands, DTR normal in all 4  ASSESSMENT: 1. Non-insulin diabetes type 2, w/o long term complication  2.  Obesity class III  3. HL   PLAN:  1. Non-insulin diabetes type 2, without long-term complications, diet controlled -Before last visit, HbA1c was higher, at 6.8% -However, we continued her without treatment since sugars appeared to be improved from the previous visit -At this visit, sugars remain very well controlled except for one hyperglycemic spike to 188.  Upon questioning, she was dehydrated at that time and felt poorly.  Discussed about the importance of staying hydrated. - we checked her HbA1c: 6.5% (better) - advised to check sugars at different  times of the day - 1x a day, rotating check times - advised for yearly eye exams >> she is not UTD - return to clinic in 6  months  2.  Obesity class III -Weight higher today by 7 pounds -She is trying to stay active at home.  3. HL - Reviewed latest lipid panel from 12/2017: LDL above goal, triglycerides at goal, HDL slightly low Lab Results  Component Value Date   CHOL 194 01/08/2018   HDL 50 (L) 01/08/2018   LDLCALC 123 (H) 01/08/2018   TRIG 103 01/08/2018   CHOLHDL 3.9 01/08/2018  -She is not on a statin.  Carlus Pavlov, MD PhD [facility] Endocrinology

## 2018-12-21 NOTE — Addendum Note (Signed)
Addended by: Cardell Peach I on: 12/21/2018 04:30 PM   Modules accepted: Orders

## 2019-03-09 ENCOUNTER — Other Ambulatory Visit: Payer: Self-pay

## 2019-03-09 ENCOUNTER — Ambulatory Visit (INDEPENDENT_AMBULATORY_CARE_PROVIDER_SITE_OTHER): Payer: PRIVATE HEALTH INSURANCE | Admitting: Family Medicine

## 2019-03-09 ENCOUNTER — Encounter: Payer: Self-pay | Admitting: Family Medicine

## 2019-03-09 VITALS — BP 144/80 | HR 79 | Temp 96.9°F | Resp 18 | Ht 60.75 in | Wt 265.6 lb

## 2019-03-09 DIAGNOSIS — R109 Unspecified abdominal pain: Secondary | ICD-10-CM

## 2019-03-09 DIAGNOSIS — R197 Diarrhea, unspecified: Secondary | ICD-10-CM

## 2019-03-09 MED ORDER — DIPHENOXYLATE-ATROPINE 2.5-0.025 MG PO TABS: 1.0000 | ORAL_TABLET | Freq: Four times a day (QID) | ORAL | 0 refills | Status: DC | PRN

## 2019-03-09 NOTE — Progress Notes (Signed)
Office Visit Note   Patient: [name]           Date of Birth: 05-Oct-1950           MRN: [id number] Visit Date: 03/09/2019 Requested by: Lavada Mesi, MD [address] [place],  [place] [address] PCP: Lavada Mesi, MD  Subjective: Chief Complaint  Patient presents with  . constant diarrhea, "severe" over past couple wks  . Abdominal Pain  . had an episode of a fib 10 days ago - x approx 2 hrs    HPI: She is here with diarrhea and abdominal pain.  For about 3 or 4 weeks she has had severe diarrhea, at least 6 or 7 times per day.  Very loose and watery stools most of the time, sometimes formed in the morning.  It has a very foul odor and is sometimes "foamy".  Denies fevers or chills.  Her husband is asymptomatic.  She has not been on antibiotics recently.  There has been no change in her medications or her diet.  She is still having adequate urine output but she feels like she is getting dehydrated.  She had an episode of atrial fibrillation last week lasting a few hours.               ROS:   All other systems were reviewed and are negative.  Objective: Vital Signs: BP (!) 144/80   Pulse 79   Temp (!) 96.9 F (36.1 C)   Resp 18   Ht 5' 0.75" (1.543 m)   Wt 265 lb 9.6 oz (120.5 kg)   BMI 50.60 kg/m   Physical Exam:  General:  Alert and oriented, in no acute distress. Pulm:  Breathing unlabored. Psy:  Normal mood, congruent affect. Skin: No visible rash. CV: Regular rate and rhythm without murmurs, rubs, or gallops.  No peripheral edema.  2+ radial and posterior tibial pulses. Lungs: Clear to auscultation throughout with no wheezing or areas of consolidation. Abdomen: Soft, no rebound or guarding.  She does have some tenderness to the left of midline in the epigastric and periumbilical area.  No palpable hernia.  Bowel sounds are active.   Imaging: None today.  Assessment & Plan: 1.  Abdominal pain with diarrhea, etiology uncertain. -Labs and stool studies  to further evaluate.  If negative, then GI referral. -Lomotil as needed. -ER if she becomes dehydrated.     Procedures: No procedures performed  No notes on file     PMFS History: Patient Active Problem List   Diagnosis Date Noted  . B12 deficiency 01/08/2018  . Vitamin D deficiency 01/08/2018  . History of pulmonary embolism 01/08/2018  . Status post total left knee replacement 10/13/2017  . Family history of thyroid disease 08/25/2017  . Chronic pain of left knee 05/20/2017  . Unilateral primary osteoarthritis, left knee 05/20/2017  . Morbid obesity (HCC) 04/30/2017  . Adnexal mass 02/17/2017  . Family history of ovarian cancer 02/17/2017  . IBS (irritable bowel syndrome) 01/21/2017  . Paroxysmal atrial fibrillation (HCC) 06/18/2016  . Type 2 diabetes mellitus without complication, without long-term current use of insulin (HCC) 01/21/2016  . H/O Spinal surgery 12/21/2015  . Barrett's esophagus 12/21/2015  . Hx of adenomatous polyp of colon 03/01/2002   Past Medical History:  Diagnosis Date  . Abnormal thyroid function test   . Allergy   . Anemia    she attributes previously to fibroids, she declines  . Barrett's esophagus   . Blood transfusion  without reported diagnosis   . DDD (degenerative disc disease), cervical   . DJD (degenerative joint disease)   . GERD (gastroesophageal reflux disease)   . Heart murmur   . History of hiatal hernia   . Hx of adenomatous polyp of colon 03/01/2002  . OA (osteoarthritis)   . Obese   . Paroxysmal atrial fibrillation (HCC)   . PE (pulmonary embolism) 12/21/2015  . Plantar fasciitis   . PONV (postoperative nausea and vomiting)    pt. reports that she woke up during surgery  . Pre-diabetes     Family History  Problem Relation Age of Onset  . Stroke Maternal Grandmother   . Bladder Cancer Maternal Grandmother 50  . Esophageal cancer Maternal Grandmother 50  . Hyperthyroidism Mother        Radioactive Iodine Treatment  .  Hypothyroidism Mother   . Cervical cancer Maternal Aunt 50  . Ovarian cancer Daughter 76  . Colon cancer Neg Hx   . Pancreatic cancer Neg Hx   . Rectal cancer Neg Hx   . Stomach cancer Neg Hx   . Diabetes Neg Hx     Past Surgical History:  Procedure Laterality Date  . ABDOMINAL HYSTERECTOMY    . Arthroscopic surgery left knee    . BACK SURGERY     2005 ,  2017  . CESAREAN SECTION    . CHOLECYSTECTOMY    . COLONOSCOPY  2003, 2011   3 mm adenoma 2011  . ESOPHAGOGASTRODUODENOSCOPY  multiple since 2003  . IR GENERIC HISTORICAL  12/19/2015   IR ANGIOGRAM SELECTIVE EACH ADDITIONAL VESSEL 12/19/2015 [name], MD [facility]  . IR GENERIC HISTORICAL  12/19/2015   IR ANGIOGRAM PULMONARY BILATERAL SELECTIVE 12/19/2015 [name], MD [facility]  . IR GENERIC HISTORICAL  12/19/2015   IR INFUSION THROMBOL ARTERIAL INITIAL (MS) 12/19/2015 [name], MD [facility]  . IR GENERIC HISTORICAL  12/19/2015   IR US GUIDE VASC ACCESS RIGHT 12/19/2015 [name], MD [facility]  . IR GENERIC HISTORICAL  12/19/2015   IR ANGIOGRAM SELECTIVE EACH ADDITIONAL VESSEL 12/19/2015 [name], MD [facility]  . IR GENERIC HISTORICAL  12/20/2015   IR THROMB F/U EVAL ART/VEN FINAL DAY (MS) 12/20/2015 Gilmer Mor, DO [facility]  . IR GENERIC HISTORICAL  12/19/2015   IR INFUSION THROMBOL ARTERIAL INITIAL (MS) 12/19/2015 [name], MD [facility]  . PLANTAR FASCIA RELEASE     right  . Right knee replacement    . SPINAL FUSION  10/31/2015   revision at [facility]   . TONSILLECTOMY    . TOTAL KNEE ARTHROPLASTY Left 10/13/2017  . TOTAL KNEE ARTHROPLASTY Left 10/13/2017   Procedure: LEFT TOTAL KNEE ARTHROPLASTY;  Surgeon: Kathryne Hitch, MD;  Location: [facility] OR;  Service: Orthopedics;  Laterality: Left;  . ULNAR TUNNEL RELEASE     right  . VENOUS ABLATION     x 2   Social History   Occupational History    Comment: Telephone sales  Tobacco Use  . Smoking status: Never  Smoker  . Smokeless tobacco: Never Used  Substance and Sexual Activity  . Alcohol use: No  . Drug use: No  . Sexual activity: Not Currently    Partners: Male    Birth control/protection: Surgical    Comment: Hysterectomy

## 2019-03-10 ENCOUNTER — Telehealth: Payer: Self-pay | Admitting: Family Medicine

## 2019-03-10 ENCOUNTER — Encounter: Payer: Self-pay | Admitting: Family Medicine

## 2019-03-10 LAB — CBC WITH DIFFERENTIAL/PLATELET
Absolute Monocytes: 314 cells/uL (ref 200–950)
Basophils Absolute: 50 cells/uL (ref 0–200)
Basophils Relative: 0.9 %
Eosinophils Absolute: 140 cells/uL (ref 15–500)
Eosinophils Relative: 2.5 %
HCT: 39.9 % (ref 35.0–45.0)
Hemoglobin: 13.2 g/dL (ref 11.7–15.5)
Lymphs Abs: 1411 cells/uL (ref 850–3900)
MCH: 27.8 pg (ref 27.0–33.0)
MCHC: 33.1 g/dL (ref 32.0–36.0)
MCV: 84 fL (ref 80.0–100.0)
MPV: 10 fL (ref 7.5–12.5)
Monocytes Relative: 5.6 %
Neutro Abs: 3685 cells/uL (ref 1500–7800)
Neutrophils Relative %: 65.8 %
Platelets: 221 10*3/uL (ref 140–400)
RBC: 4.75 10*6/uL (ref 3.80–5.10)
RDW: 12.7 % (ref 11.0–15.0)
Total Lymphocyte: 25.2 %
WBC: 5.6 10*3/uL (ref 3.8–10.8)

## 2019-03-10 LAB — COMPREHENSIVE METABOLIC PANEL
AG Ratio: 1.5 (calc) (ref 1.0–2.5)
ALT: 10 U/L (ref 6–29)
AST: 16 U/L (ref 10–35)
Albumin: 4 g/dL (ref 3.6–5.1)
Alkaline phosphatase (APISO): 67 U/L (ref 37–153)
BUN: 16 mg/dL (ref 7–25)
CO2: 26 mmol/L (ref 20–32)
Calcium: 9.3 mg/dL (ref 8.6–10.4)
Chloride: 106 mmol/L (ref 98–110)
Creat: 0.89 mg/dL (ref 0.50–0.99)
Globulin: 2.7 g/dL (calc) (ref 1.9–3.7)
Glucose, Bld: 126 mg/dL — ABNORMAL HIGH (ref 65–99)
Potassium: 4.1 mmol/L (ref 3.5–5.3)
Sodium: 141 mmol/L (ref 135–146)
Total Bilirubin: 1 mg/dL (ref 0.2–1.2)
Total Protein: 6.7 g/dL (ref 6.1–8.1)

## 2019-03-10 LAB — THYROID PANEL WITH TSH
Free Thyroxine Index: 1.8 (ref 1.4–3.8)
T3 Uptake: 25 % (ref 22–35)
T4, Total: 7 ug/dL (ref 5.1–11.9)
TSH: 2.93 mIU/L (ref 0.40–4.50)

## 2019-03-10 LAB — LIPASE: Lipase: 17 U/L (ref 7–60)

## 2019-03-10 LAB — AMYLASE: Amylase: 18 U/L — ABNORMAL LOW (ref 21–101)

## 2019-03-10 NOTE — Telephone Encounter (Signed)
Labs look good so far.  Nothing to suggest pancreatitis, and electrolytes are normal.  Stool culture still pending.

## 2019-03-15 LAB — STOOL CULTURE
MICRO NUMBER:: 1126589
MICRO NUMBER:: 1126590
MICRO NUMBER:: 1126591
SHIGA RESULT:: NOT DETECTED
SPECIMEN QUALITY:: ADEQUATE
SPECIMEN QUALITY:: ADEQUATE
SPECIMEN QUALITY:: ADEQUATE

## 2019-03-28 ENCOUNTER — Other Ambulatory Visit: Payer: Self-pay

## 2019-03-30 ENCOUNTER — Telehealth: Payer: Self-pay | Admitting: Family Medicine

## 2019-03-30 DIAGNOSIS — R197 Diarrhea, unspecified: Secondary | ICD-10-CM

## 2019-03-30 DIAGNOSIS — R109 Unspecified abdominal pain: Secondary | ICD-10-CM

## 2019-03-30 NOTE — Telephone Encounter (Signed)
C. Diff test was negative/normal.

## 2019-03-31 NOTE — Addendum Note (Signed)
Addended by: Hortencia Pilar on: 03/31/2019 09:55 AM   Modules accepted: Orders

## 2019-04-01 NOTE — Addendum Note (Signed)
Addended by: Hortencia Pilar on: 04/01/2019 07:54 AM   Modules accepted: Orders

## 2019-04-12 ENCOUNTER — Encounter: Payer: Self-pay | Admitting: Family Medicine

## 2019-05-10 ENCOUNTER — Encounter: Payer: Self-pay | Admitting: Family Medicine

## 2019-05-10 DIAGNOSIS — M2550 Pain in unspecified joint: Secondary | ICD-10-CM

## 2019-05-17 ENCOUNTER — Encounter: Payer: Self-pay | Admitting: Family Medicine

## 2019-05-26 ENCOUNTER — Ambulatory Visit: Payer: PRIVATE HEALTH INSURANCE | Admitting: Internal Medicine

## 2019-05-26 ENCOUNTER — Encounter: Payer: Self-pay | Admitting: Internal Medicine

## 2019-05-26 VITALS — BP 120/72 | HR 76 | Temp 97.6°F | Ht 60.75 in | Wt 263.2 lb

## 2019-05-26 DIAGNOSIS — K625 Hemorrhage of anus and rectum: Secondary | ICD-10-CM | POA: Diagnosis not present

## 2019-05-26 DIAGNOSIS — R1010 Upper abdominal pain, unspecified: Secondary | ICD-10-CM

## 2019-05-26 DIAGNOSIS — K2270 Barrett's esophagus without dysplasia: Secondary | ICD-10-CM

## 2019-05-26 DIAGNOSIS — R197 Diarrhea, unspecified: Secondary | ICD-10-CM

## 2019-05-26 DIAGNOSIS — G8929 Other chronic pain: Secondary | ICD-10-CM

## 2019-05-26 NOTE — Progress Notes (Signed)
[name] 69 y.o. 1951-01-25 [id number]  Assessment & Plan:   Encounter Diagnoses  Name Primary?  . Chronic upper abdominal pain Yes  . Diarrhea, unspecified type   . Rectal bleeding   . Barrett's esophagus without dysplasia     I still strongly favor functional disorder and I think pelvic floor physical therapy could be quite helpful but I think with everything that is going on is reasonable to rule out structural issues inflammatory conditions, malignancy seems very unlikely.  We will do an EGD and a colonoscopy because her BMI is over 50 she needs her procedures at the hospital where there is a greater safety net.  Pending these results we consider possible MR defecography, CT scanning has shown pelvic floor laxity.  I will revisit physical therapy with her.  Alosetron is something to consider.  Her hyperesthetic abdominal pain seems neuropathic.  I am not sure of be able to help that, she does not complain of back pain or anything that sounds radicular though she does have a history of spinal surgery but that is in the lumbar and sacral region.  Note that she had aspiration of a right ovarian cyst picked up on CT scanning in 2018 when I had last seen her in that is a nonissue at this point.  I appreciate the opportunity to care for this patient. CC: Hilts, Michael, MD   Subjective:   Chief Complaint: Abdominal pain and diarrhea  HPI [name] is here with continued complaints, last seen in 2018 with similar problems diagnosed with IBS and reports continued periumbilical and epigastric pain and diffuse upper abdominal hyperesthesia at times.  In addition she is rarely constipated but most of the time will have problems where she has 4 bowel movements in the morning start out normal progressively looser ending up with a fourth watery bowel movement and sometimes there are coffee grounds in it which is alarming.  I had recommended she try pelvic floor physical therapy  because previous CT scanning demonstrated some pelvic floor laxity.  She had had a small capacity bladder diagnosed by Dr. Vernie Ammons of urology, pelvic floor physical therapy was recommended by him as well and she did not follow through with that.  That was before I saw her.  She has a history of Barrett's which is stable without dysplasia.  Last colonoscopy Dr. Bosie Clos 2011.  No polyps then but an apparent polyp previous to that.  Sometimes she has rectal bleeding usually on the rare episodes when she is constipated.  IBgard dicyclomine have not been helpful.  Dr. Prince Rome perform stool studies with culture and C. difficile testing these were negative.   Wt Readings from Last 3 Encounters:  05/26/19 263 lb 3.2 oz (119.4 kg)  03/09/19 265 lb 9.6 oz (120.5 kg)  12/21/18 263 lb (119.3 kg)   She says her urinary symptoms are not an issue, she has urgent defecation has not had fecal incontinence and denies any urinary leakage and says she is "more in control" with that issue.  She does say that if she gets constipated which is rare she has difficulty urinating.  She has had the first [place] vaccine on January 16 and is due on February 27 for second dose.  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  She says that her bowel issues and other gastrointestinal complaints make it difficult at work.  She does relate that she feels like even  though many of the symptoms are chronic after she had a pulmonary embolism in 2017 her problems worsened. Allergies  Allergen Reactions  . Ciprofloxacin Anaphylaxis  . Doxycycline Anaphylaxis  . Strawberry Extract Anaphylaxis  . Tetracyclines & Related Anaphylaxis   Current Meds  Medication Sig  . CVS LUBRICANT DROPS 1 % GEL Apply 1 drop to both eyes daily as needed for allergic pruritis.   Past Medical History:  Diagnosis Date  . Abnormal thyroid function test   . Allergy   . Anemia    she attributes  previously to fibroids, she declines  . Barrett's esophagus   . Blood transfusion without reported diagnosis   . DDD (degenerative disc disease), cervical   . DJD (degenerative joint disease)   . GERD (gastroesophageal reflux disease)   . Heart murmur   . History of hiatal hernia   . Hx of adenomatous polyp of colon 03/01/2002  . OA (osteoarthritis)   . Obese   . Paroxysmal atrial fibrillation (HCC)   . PE (pulmonary embolism) 12/21/2015  . Plantar fasciitis   . PONV (postoperative nausea and vomiting)    pt. reports that she woke up during surgery  . Pre-diabetes    Past Surgical History:  Procedure Laterality Date  . ABDOMINAL HYSTERECTOMY    . Arthroscopic surgery left knee    . BACK SURGERY     2005 ,  2017  . CESAREAN SECTION    . CHOLECYSTECTOMY    . COLONOSCOPY  2003, 2011   3 mm adenoma 2011  . ESOPHAGOGASTRODUODENOSCOPY  multiple since 2003  . IR GENERIC HISTORICAL  12/19/2015   IR ANGIOGRAM SELECTIVE EACH ADDITIONAL VESSEL 12/19/2015 [name], MD [facility]  . IR GENERIC HISTORICAL  12/19/2015   IR ANGIOGRAM PULMONARY BILATERAL SELECTIVE 12/19/2015 [name], MD [facility]  . IR GENERIC HISTORICAL  12/19/2015   IR INFUSION THROMBOL ARTERIAL INITIAL (MS) 12/19/2015 [name], MD [facility]  . IR GENERIC HISTORICAL  12/19/2015   IR US GUIDE VASC ACCESS RIGHT 12/19/2015 [name], MD [facility]  . IR GENERIC HISTORICAL  12/19/2015   IR ANGIOGRAM SELECTIVE EACH ADDITIONAL VESSEL 12/19/2015 [name], MD [facility]  . IR GENERIC HISTORICAL  12/20/2015   IR THROMB F/U EVAL ART/VEN FINAL DAY (MS) 12/20/2015 Gilmer Mor, DO [facility]  . IR GENERIC HISTORICAL  12/19/2015   IR INFUSION THROMBOL ARTERIAL INITIAL (MS) 12/19/2015 [name], MD [facility]  . PLANTAR FASCIA RELEASE     right  . Right knee replacement    . SPINAL FUSION  10/31/2015   revision at [facility]   . TONSILLECTOMY    . TOTAL KNEE ARTHROPLASTY Left  10/13/2017   Procedure: LEFT TOTAL KNEE ARTHROPLASTY;  Surgeon: Kathryne Hitch, MD;  Location: [facility];  Service: Orthopedics;  Laterality: Left;  . ULNAR TUNNEL RELEASE     right  . VENOUS ABLATION     x 2   Social History   Social History Narrative   Married.   3 children, 2 grandchildren.   Work's in a call center.   Enjoys reading, sewing, spending time with her grandchildren.   family history includes Bladder Cancer (age of onset: 7) in her maternal grandmother; Cervical cancer (age of onset: 18) in her maternal aunt; Esophageal cancer (age of onset: 75) in her maternal grandmother; Hyperthyroidism in her mother; Hypothyroidism in her mother; Ovarian cancer (age of onset: 54) in her daughter; Stroke in her maternal grandmother.  Review of Systems As per HPI  Objective:   Physical Exam @BP  120/72   Pulse 76   Temp 97.6 F (36.4 C)   Ht 5' 0.75" (1.543 m)   Wt 263 lb 3.2 oz (119.4 kg)   BMI 50.14 kg/m @  General:  NAD, obese Eyes:   anicteric Lungs:  clear Heart::  S1S2 no rubs, murmurs or gallops Abdomen:  Obese soft and tender epigastrium, BS+ no hernia and neg Carnett's     Data Reviewed:  See HPI

## 2019-05-26 NOTE — Patient Instructions (Signed)
You have been scheduled for an endoscopy and colonoscopy. Please follow the written instructions given to you at your visit today. Please pick up your prep supplies at the pharmacy within the next 1-3 days. If you use inhalers (even only as needed), please bring them with you on the day of your procedure. Your physician has requested that you go to www.[email or web address] and enter the access code given to you at your visit today. This web site gives a general overview about your procedure. However, you should still follow specific instructions given to you by our office regarding your preparation for the procedure.   I appreciate the opportunity to care for you. Silvano Rusk, MD, [facility]

## 2019-05-29 ENCOUNTER — Encounter: Payer: Self-pay | Admitting: Family Medicine

## 2019-06-14 NOTE — Progress Notes (Signed)
Attempted to obtain medical history via telephone, unable to reach at this time. I left a voicemail to return pre surgical testing department's phone call.  

## 2019-06-15 ENCOUNTER — Telehealth: Payer: Self-pay | Admitting: Internal Medicine

## 2019-06-15 DIAGNOSIS — K625 Hemorrhage of anus and rectum: Secondary | ICD-10-CM

## 2019-06-15 DIAGNOSIS — R197 Diarrhea, unspecified: Secondary | ICD-10-CM

## 2019-06-15 DIAGNOSIS — K2270 Barrett's esophagus without dysplasia: Secondary | ICD-10-CM

## 2019-06-15 NOTE — Telephone Encounter (Addendum)
Dr. Carlean Purl, be advised:  Patient cancelled appt with Dr. Carlean Purl on 3/1 at 8:30 am. Staff message sent to [facility] screening also cancelled. Case ID: [id number]

## 2019-06-15 NOTE — Telephone Encounter (Signed)
Pt requested to reschedule her upcoming hospital EGD scheduled 06/20/19.  She stated that she is feeling ill from the second covid vaccine.

## 2019-06-16 ENCOUNTER — Other Ambulatory Visit (HOSPITAL_COMMUNITY): Payer: PRIVATE HEALTH INSURANCE

## 2019-06-20 ENCOUNTER — Ambulatory Visit (HOSPITAL_COMMUNITY)
Admission: RE | Admit: 2019-06-20 | Payer: PRIVATE HEALTH INSURANCE | Source: Home / Self Care | Admitting: Internal Medicine

## 2019-06-20 ENCOUNTER — Ambulatory Visit: Payer: PRIVATE HEALTH INSURANCE | Admitting: Internal Medicine

## 2019-06-20 ENCOUNTER — Encounter (HOSPITAL_COMMUNITY): Admission: RE | Payer: Self-pay | Source: Home / Self Care

## 2019-06-20 SURGERY — ESOPHAGOGASTRODUODENOSCOPY (EGD) WITH PROPOFOL
Anesthesia: Monitor Anesthesia Care

## 2019-06-24 ENCOUNTER — Encounter: Payer: Self-pay | Admitting: Family Medicine

## 2019-06-24 ENCOUNTER — Other Ambulatory Visit: Payer: Self-pay

## 2019-06-24 ENCOUNTER — Ambulatory Visit: Payer: PRIVATE HEALTH INSURANCE | Admitting: Family Medicine

## 2019-06-24 VITALS — BP 173/87 | HR 87 | Resp 20

## 2019-06-24 DIAGNOSIS — R002 Palpitations: Secondary | ICD-10-CM

## 2019-06-24 DIAGNOSIS — R0600 Dyspnea, unspecified: Secondary | ICD-10-CM

## 2019-06-24 DIAGNOSIS — R600 Localized edema: Secondary | ICD-10-CM

## 2019-06-24 DIAGNOSIS — M542 Cervicalgia: Secondary | ICD-10-CM

## 2019-06-24 DIAGNOSIS — D225 Melanocytic nevi of trunk: Secondary | ICD-10-CM

## 2019-06-24 DIAGNOSIS — M2550 Pain in unspecified joint: Secondary | ICD-10-CM

## 2019-06-24 DIAGNOSIS — D1801 Hemangioma of skin and subcutaneous tissue: Secondary | ICD-10-CM

## 2019-06-24 NOTE — Progress Notes (Signed)
Office Visit Note   Patient: [name]           Date of Birth: [birth year]           MRN: [id number] Visit Date: 06/24/2019 Requested by: Lavada Mesi, MD [address] [place],  [place] [address] PCP: Lavada Mesi, MD  Subjective: Chief Complaint  Patient presents with  . Afib has returned, recent 2nd covid vaccine, SOB  . check mole on abdomen    HPI: She is here with multiple concerns.  Over the past couple months she has noticed increased episodes of palpitations and shortness of breath.  She does have a history of paroxysmal atrial fibrillation but has not had any troubles in a while.  She thinks it might be triggered by getting the COVID-19 vaccine.  The episodes happen about once or twice every 10 days.  Today she feels okay.  In addition she has noticed some color change in her legs recently.  She does have chronic edema but now they appear to be purple-colored.  She also noticed a change in the appearance of a skin lesion on her abdomen.  The deeper tissue feels nodular to her and is tender to touch.  She also noticed tightness in her anterior neck when she yawns, and difficulty swallowing sometimes.  She noticed a deformity when she looks at her neck.               ROS: No fevers or chills.  All other systems were reviewed and are negative.  Objective: Vital Signs: BP (!) 173/87   Pulse 87   Resp 20   SpO2 98%   Physical Exam:  General:  Alert and oriented, in no acute distress. Pulm:  Breathing unlabored. Psy:  Normal mood, congruent affect. 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  There is nodularity in the subcutaneous tissue, etiology uncertain.   CV: Regular rate and rhythm without murmurs, rubs, or gallops. 2+ radial and posterior tibial pulses. Lungs: Clear to auscultation throughout with no wheezing or areas of consolidation. Legs: She has bilateral peripheral edema with venous stasis changes. Neck: Her  sternocleidomastoid muscles appear asymmetric, almost as though the right side has ruptured.    Imaging: None today  Assessment & Plan: 1.  Intermittent palpitations, seems to be in sinus rhythm today. -We will order home monitoring. -She will call her cardiologist if symptoms become more prominent. -D-dimer ordered today due to presence of leg edema and discoloration, as well as intermittent shortness of breath.  Chest CT and lower extremity Dopplers if D-dimer is positive.  2.  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  3.  Neck asymmetry, could be injury to right sided sternocleidomastoid muscle -She is supposed to have upper endoscopy in the near future.  If that is unrevealing, she will contact me for a physical therapy referral.     Procedures: No procedures performed  No notes on file     PMFS History: Patient Active Problem List   Diagnosis Date Noted  . B12 deficiency 01/08/2018  . Vitamin D deficiency 01/08/2018  . History of pulmonary embolism 01/08/2018  . Status post total left knee replacement 10/13/2017  . Family history of thyroid disease 08/25/2017  . Chronic pain of left knee 05/20/2017  . Unilateral primary osteoarthritis, left knee 05/20/2017  . Morbid obesity (HCC) 04/30/2017  . Adnexal mass 02/17/2017  .  Family history of ovarian cancer 02/17/2017  . IBS (irritable bowel syndrome) 01/21/2017  . Paroxysmal atrial fibrillation (HCC) 06/18/2016  . Type 2 diabetes mellitus without complication, without long-term current use of insulin (HCC) 01/21/2016  . H/O Spinal surgery 12/21/2015  . Barrett's esophagus 12/21/2015  . Hx of adenomatous polyp of colon 03/01/2002   Past Medical History:  Diagnosis Date  . Abnormal thyroid function test   . Allergy   . Anemia    she attributes previously to fibroids, she declines  . Barrett's esophagus   . Blood transfusion without  reported diagnosis   . DDD (degenerative disc disease), cervical   . DJD (degenerative joint disease)   . GERD (gastroesophageal reflux disease)   . Heart murmur   . History of hiatal hernia   . Hx of adenomatous polyp of colon 03/01/2002  . OA (osteoarthritis)   . Obese   . Paroxysmal atrial fibrillation (HCC)   . PE (pulmonary embolism) 12/21/2015  . Plantar fasciitis   . PONV (postoperative nausea and vomiting)    pt. reports that she woke up during surgery  . Pre-diabetes     Family History  Problem Relation Age of Onset  . Stroke Maternal Grandmother   . Bladder Cancer Maternal Grandmother 50  . Esophageal cancer Maternal Grandmother 50  . Hyperthyroidism Mother        Radioactive Iodine Treatment  . Hypothyroidism Mother   . Cervical cancer Maternal Aunt 50  . Ovarian cancer Daughter 26  . Colon cancer Neg Hx   . Pancreatic cancer Neg Hx   . Rectal cancer Neg Hx   . Stomach cancer Neg Hx   . Diabetes Neg Hx     Past Surgical History:  Procedure Laterality Date  . ABDOMINAL HYSTERECTOMY    . Arthroscopic surgery left knee    . BACK SURGERY     2005 ,  2017  . CESAREAN SECTION    . CHOLECYSTECTOMY    . COLONOSCOPY  2003, 2011   3 mm adenoma 2011  . ESOPHAGOGASTRODUODENOSCOPY  multiple since 2003  . IR GENERIC HISTORICAL  12/19/2015   IR ANGIOGRAM SELECTIVE EACH ADDITIONAL VESSEL 12/19/2015 [name], MD [facility]  . IR GENERIC HISTORICAL  12/19/2015   IR ANGIOGRAM PULMONARY BILATERAL SELECTIVE 12/19/2015 [name], MD [facility]  . IR GENERIC HISTORICAL  12/19/2015   IR INFUSION THROMBOL ARTERIAL INITIAL (MS) 12/19/2015 [name], MD [facility]  . IR GENERIC HISTORICAL  12/19/2015   IR US GUIDE VASC ACCESS RIGHT 12/19/2015 [name], MD [facility]  . IR GENERIC HISTORICAL  12/19/2015   IR ANGIOGRAM SELECTIVE EACH ADDITIONAL VESSEL 12/19/2015 [name], MD [facility]  . IR GENERIC HISTORICAL  12/20/2015   IR THROMB F/U EVAL ART/VEN  FINAL DAY (MS) 12/20/2015 Gilmer Mor, DO [facility]  . IR GENERIC HISTORICAL  12/19/2015   IR INFUSION THROMBOL ARTERIAL INITIAL (MS) 12/19/2015 [name], MD [facility]  . PLANTAR FASCIA RELEASE     right  . Right knee replacement    . SPINAL FUSION  10/31/2015   revision at [facility]   . TONSILLECTOMY    . TOTAL KNEE ARTHROPLASTY Left 10/13/2017   Procedure: LEFT TOTAL KNEE ARTHROPLASTY;  Surgeon: Kathryne Hitch, MD;  Location: [facility] OR;  Service: Orthopedics;  Laterality: Left;  . ULNAR TUNNEL RELEASE     right  . VENOUS ABLATION     x 2   Social History   Occupational History  Comment: Telephone sales  Tobacco Use  . Smoking status: Never Smoker  . Smokeless tobacco: Never Used  Substance and Sexual Activity  . Alcohol use: No  . Drug use: No  . Sexual activity: Not Currently    Partners: Male    Birth control/protection: Surgical    Comment: Hysterectomy

## 2019-06-25 ENCOUNTER — Encounter: Payer: Self-pay | Admitting: Family Medicine

## 2019-06-25 LAB — D-DIMER, QUANTITATIVE: D-Dimer, Quant: 1.02 mcg/mL FEU — ABNORMAL HIGH (ref <0.50)

## 2019-06-26 ENCOUNTER — Telehealth: Payer: Self-pay | Admitting: Family Medicine

## 2019-06-26 DIAGNOSIS — R0602 Shortness of breath: Secondary | ICD-10-CM

## 2019-06-26 DIAGNOSIS — R600 Localized edema: Secondary | ICD-10-CM

## 2019-06-26 DIAGNOSIS — R0600 Dyspnea, unspecified: Secondary | ICD-10-CM

## 2019-06-26 DIAGNOSIS — R7989 Other specified abnormal findings of blood chemistry: Secondary | ICD-10-CM

## 2019-06-26 NOTE — Telephone Encounter (Signed)
Notified pt:  D-Dimer is positive/elevated.  This doesn't mean you have a blood clot, but it makes a clot more likely.  You should have dopplers of your legs and probably a CT scan of your chest.  Since it's the weekend, I can't arrange those thins.  Therefore you should probably go to the ER and tell them of your symptoms and lab results (they will have it all in the system) and they can do all the testing today.

## 2019-06-26 NOTE — Addendum Note (Signed)
Addended by: Hortencia Pilar on: 06/26/2019 01:24 PM   Modules accepted: Orders

## 2019-06-27 ENCOUNTER — Emergency Department (HOSPITAL_COMMUNITY): Payer: No Typology Code available for payment source

## 2019-06-27 ENCOUNTER — Telehealth: Payer: Self-pay | Admitting: Family Medicine

## 2019-06-27 ENCOUNTER — Other Ambulatory Visit: Payer: Self-pay

## 2019-06-27 ENCOUNTER — Emergency Department (HOSPITAL_COMMUNITY)
Admission: EM | Admit: 2019-06-27 | Discharge: 2019-06-27 | Disposition: A | Discharge status: Home / Self Care | Payer: No Typology Code available for payment source | Attending: Emergency Medicine | Admitting: Emergency Medicine

## 2019-06-27 ENCOUNTER — Encounter (HOSPITAL_COMMUNITY): Payer: Self-pay | Admitting: Emergency Medicine

## 2019-06-27 DIAGNOSIS — Z86711 Personal history of pulmonary embolism: Secondary | ICD-10-CM | POA: Diagnosis not present

## 2019-06-27 DIAGNOSIS — I83008 Varicose veins of unspecified lower extremity with ulcer other part of lower leg: Secondary | ICD-10-CM | POA: Diagnosis not present

## 2019-06-27 DIAGNOSIS — R0602 Shortness of breath: Secondary | ICD-10-CM

## 2019-06-27 DIAGNOSIS — R072 Precordial pain: Secondary | ICD-10-CM | POA: Insufficient documentation

## 2019-06-27 DIAGNOSIS — I83009 Varicose veins of unspecified lower extremity with ulcer of unspecified site: Secondary | ICD-10-CM

## 2019-06-27 DIAGNOSIS — L97909 Non-pressure chronic ulcer of unspecified part of unspecified lower leg with unspecified severity: Secondary | ICD-10-CM | POA: Diagnosis not present

## 2019-06-27 LAB — CBC
HCT: 43.5 % (ref 36.0–46.0)
Hemoglobin: 14 g/dL (ref 12.0–15.0)
MCH: 27 pg (ref 26.0–34.0)
MCHC: 32.2 g/dL (ref 30.0–36.0)
MCV: 84 fL (ref 80.0–100.0)
Platelets: 226 10*3/uL (ref 150–400)
RBC: 5.18 MIL/uL — ABNORMAL HIGH (ref 3.87–5.11)
RDW: 13.2 % (ref 11.5–15.5)
WBC: 6.2 10*3/uL (ref 4.0–10.5)
nRBC: 0 % (ref 0.0–0.2)

## 2019-06-27 LAB — URINALYSIS, ROUTINE W REFLEX MICROSCOPIC
Bacteria, UA: NONE SEEN
Bilirubin Urine: NEGATIVE
Glucose, UA: NEGATIVE mg/dL
Ketones, ur: NEGATIVE mg/dL
Nitrite: NEGATIVE
Protein, ur: NEGATIVE mg/dL
Specific Gravity, Urine: 1.017 (ref 1.005–1.030)
pH: 9 — ABNORMAL HIGH (ref 5.0–8.0)

## 2019-06-27 LAB — COMPREHENSIVE METABOLIC PANEL
ALT: 18 U/L (ref 0–44)
AST: 20 U/L (ref 15–41)
Albumin: 3.6 g/dL (ref 3.5–5.0)
Alkaline Phosphatase: 63 U/L (ref 38–126)
Anion gap: 11 (ref 5–15)
BUN: 14 mg/dL (ref 8–23)
CO2: 22 mmol/L (ref 22–32)
Calcium: 9.3 mg/dL (ref 8.9–10.3)
Chloride: 106 mmol/L (ref 98–111)
Creatinine, Ser: 1.01 mg/dL — ABNORMAL HIGH (ref 0.44–1.00)
GFR calc Af Amer: >60 mL/min (ref >60)
GFR calc non Af Amer: 57 mL/min — ABNORMAL LOW (ref >60)
Glucose, Bld: 144 mg/dL — ABNORMAL HIGH (ref 70–99)
Potassium: 4.2 mmol/L (ref 3.5–5.1)
Sodium: 139 mmol/L (ref 135–145)
Total Bilirubin: 1.7 mg/dL — ABNORMAL HIGH (ref 0.3–1.2)
Total Protein: 7.2 g/dL (ref 6.5–8.1)

## 2019-06-27 LAB — TROPONIN I (HIGH SENSITIVITY)
Troponin I (High Sensitivity): 6 ng/L (ref <18)
Troponin I (High Sensitivity): 5 ng/L (ref <18)

## 2019-06-27 LAB — BRAIN NATRIURETIC PEPTIDE: B Natriuretic Peptide: 45.9 pg/mL (ref 0.0–100.0)

## 2019-06-27 MED ORDER — FAMOTIDINE 20 MG PO TABS: 20.0000 mg | ORAL_TABLET | Freq: Once | ORAL | Status: DC

## 2019-06-27 MED ORDER — IOHEXOL 350 MG/ML SOLN
75.0000 mL | Freq: Once | INTRAVENOUS | Status: AC | PRN
  Administered 2019-06-27: 75 mL via INTRAVENOUS

## 2019-06-27 MED ORDER — ACETAMINOPHEN 500 MG PO TABS: 1000.0000 mg | ORAL_TABLET | Freq: Once | ORAL | Status: DC

## 2019-06-27 MED ORDER — ALUM & MAG HYDROXIDE-SIMETH 200-200-20 MG/5ML PO SUSP: 15.0000 mL | Freq: Once | ORAL | Status: DC

## 2019-06-27 NOTE — Discharge Instructions (Addendum)
It was our pleasure to provide your ER care today - we hope that you feel better.  Your scan was read as showing no pulmonary embolism, and your heart tests/lab work looks good.   Follow up with your doctor/heart doctor in the coming week.  For your gi symptoms, follow up with your gi doctor in the next 1-2 week.  Return to ER if worse, new symptoms, fevers, increased trouble breathing, recurrent/persistent chest pain, severe abdominal pain or persistent vomiting, or other concern.

## 2019-06-27 NOTE — ED Notes (Signed)
Pt transported to CT ?

## 2019-06-27 NOTE — ED Notes (Signed)
Patient verbalizes understanding of discharge instructions. Opportunity for questioning and answers were provided. Armband removed by staff, pt discharged from ED via [facility]

## 2019-06-27 NOTE — ED Provider Notes (Signed)
[facility] EMERGENCY DEPARTMENT Provider Note   CSN: [id number] Arrival date & time: 06/27/19  [id number]     History Chief Complaint  Patient presents with  . Shortness of Breath    [name] is a 69 y.o. female.  Patient c/o sob and chest tightness for the past 2 weeks. Symptoms gradual onset, at rest, constant, moderate, persistent, at times worse w exertion. States recently saw her doctor for same, and indicates was told d-dimer was high, and to go to ER for CT scan. Patient notes hx PE a few years ago in the couple months after having a surgical procedure - was on eliquis them. Denies recent VTE. Patient with hx afib, no current anticoag therapy. No pleuritic pain. Indicates has chronic lymphedema and bilateral leg swelling, no acute change. Also notes chronic abd pain, diffuse, mild, without acute or abrupt change. No vomiting or distension, having normal bms - indicates has seen pcp and gi for same. No fever or chills. Patient indicates 2-3 weeks ago had second covid vaccine.   The history is provided by the patient.       Past Medical History:  Diagnosis Date  . Abnormal thyroid function test   . Allergy   . Anemia    she attributes previously to fibroids, she declines  . Barrett's esophagus   . Blood transfusion without reported diagnosis   . DDD (degenerative disc disease), cervical   . DJD (degenerative joint disease)   . GERD (gastroesophageal reflux disease)   . Heart murmur   . History of hiatal hernia   . Hx of adenomatous polyp of colon 03/01/2002  . OA (osteoarthritis)   . Obese   . Paroxysmal atrial fibrillation (HCC)   . PE (pulmonary embolism) 12/21/2015  . Plantar fasciitis   . PONV (postoperative nausea and vomiting)    pt. reports that she woke up during surgery  . Pre-diabetes     Patient Active Problem List   Diagnosis Date Noted  . B12 deficiency 01/08/2018  . Vitamin D deficiency 01/08/2018  . History of pulmonary  embolism 01/08/2018  . Status post total left knee replacement 10/13/2017  . Family history of thyroid disease 08/25/2017  . Chronic pain of left knee 05/20/2017  . Unilateral primary osteoarthritis, left knee 05/20/2017  . Morbid obesity ([place]) 04/30/2017  . Adnexal mass 02/17/2017  . Family history of ovarian cancer 02/17/2017  . IBS (irritable bowel syndrome) 01/21/2017  . Paroxysmal atrial fibrillation ([place]) 06/18/2016  . Type 2 diabetes mellitus without complication, without long-term current use of insulin ([place]) 01/21/2016  . H/O Spinal surgery 12/21/2015  . Barrett's esophagus 12/21/2015  . Hx of adenomatous polyp of colon 03/01/2002    Past Surgical History:  Procedure Laterality Date  . ABDOMINAL HYSTERECTOMY    . Arthroscopic surgery left knee    . BACK SURGERY     2005 ,  2017  . CESAREAN SECTION    . CHOLECYSTECTOMY    . COLONOSCOPY  2003, 2011   3 mm adenoma 2011  . ESOPHAGOGASTRODUODENOSCOPY  multiple since 2003  . IR GENERIC HISTORICAL  12/19/2015   IR ANGIOGRAM SELECTIVE EACH ADDITIONAL VESSEL 12/19/2015 Greggory Keen, MD [facility]  . IR GENERIC HISTORICAL  12/19/2015   IR ANGIOGRAM PULMONARY BILATERAL SELECTIVE 12/19/2015 Greggory Keen, MD [facility]  . IR GENERIC HISTORICAL  12/19/2015   IR INFUSION THROMBOL ARTERIAL INITIAL (MS) 12/19/2015 Greggory Keen, MD [facility]  . IR GENERIC HISTORICAL  12/19/2015  IR US GUIDE VASC ACCESS RIGHT 12/19/2015 Greggory Keen, MD [facility]  . IR GENERIC HISTORICAL  12/19/2015   IR ANGIOGRAM SELECTIVE EACH ADDITIONAL VESSEL 12/19/2015 Greggory Keen, MD [facility]  . IR GENERIC HISTORICAL  12/20/2015   IR THROMB F/U EVAL ART/VEN FINAL DAY (MS) 12/20/2015 Corrie Mckusick, DO [facility]  . IR GENERIC HISTORICAL  12/19/2015   IR INFUSION THROMBOL ARTERIAL INITIAL (MS) 12/19/2015 Greggory Keen, MD [facility]  . PLANTAR FASCIA RELEASE     right  . Right knee replacement    . SPINAL FUSION  10/31/2015   revision at [place]    . TOTAL KNEE ARTHROPLASTY Left 10/13/2017   Procedure: LEFT TOTAL KNEE ARTHROPLASTY;  Surgeon: Mcarthur Rossetti, MD;  Location: [place];  Service: Orthopedics;  Laterality: Left;  . ULNAR TUNNEL RELEASE     right  . VENOUS ABLATION     x 2     OB History    Gravida  4   Para  3   Term  2   Preterm  1   AB  1   Living  3     SAB  1   TAB      Ectopic      Multiple      Live Births  3           Family History  Problem Relation Age of Onset  . Stroke Maternal Grandmother   . Bladder Cancer Maternal Grandmother 55  . Esophageal cancer Maternal Grandmother 34  . Hyperthyroidism Mother        Radioactive Iodine Treatment  . Hypothyroidism Mother   . Cervical cancer Maternal Aunt 50  . Ovarian cancer Daughter 29  . Colon cancer Neg Hx   . Pancreatic cancer Neg Hx   . Rectal cancer Neg Hx   . Stomach cancer Neg Hx   . Diabetes Neg Hx     Social History   Tobacco Use  . Smoking status: Never Smoker  . Smokeless tobacco: Never Used  Substance Use Topics  . Alcohol use: No  . Drug use: No    Home Medications Prior to Admission medications   Medication Sig Start Date End Date Taking? Authorizing Provider  Cholecalciferol (VITAMIN D-3) 1000 units CAPS Take 1,000 Units by mouth daily in the afternoon.     [provider]  [employer] LUBRICANT DROPS 1 % GEL Place 1 drop into both eyes 3 (three) times daily as needed (dry/irritated eyes.).  05/23/17   [provider]  diphenoxylate-atropine (LOMOTIL) 2.5-0.025 MG tablet Take 1-2 tablets by mouth 4 (four) times daily as needed for diarrhea or loose stools. 03/09/19   Hilts, Michael, MD  MAGNESIUM PO Take 1 tablet by mouth daily as needed (MAGNESIUM SUPPLEMENTATION.).    [provider]  POTASSIUM PO Take 1 tablet by mouth daily as needed (POTASSIUM SUPPLEMENT.).    [provider]    Allergies    Ciprofloxacin, Doxycycline, Strawberry  extract, and Tetracyclines & related  Review of Systems   Review of Systems  Constitutional: Negative for fever.  HENT: Negative for sore throat.   Eyes: Negative for redness.  Respiratory: Positive for shortness of breath. Negative for cough.   Cardiovascular: Positive for chest pain and leg swelling. Negative for palpitations.  Gastrointestinal: Negative for abdominal pain, diarrhea and vomiting.  Endocrine: Negative for polyuria.  Genitourinary: Negative for dysuria and flank pain.  Musculoskeletal: Negative for neck  pain and neck stiffness.  Skin: Negative for rash.  Neurological: Negative for headaches.  Hematological: Does not bruise/bleed easily.  Psychiatric/Behavioral: Negative for confusion.    Physical Exam Updated Vital Signs There were no vitals taken for this visit.  Physical Exam Vitals and nursing note reviewed.  Constitutional:      Appearance: Normal appearance. She is well-developed.  HENT:     Head: Atraumatic.     Nose: Nose normal.     Mouth/Throat:     Mouth: Mucous membranes are moist.  Eyes:     General: No scleral icterus.    Conjunctiva/sclera: Conjunctivae normal.  Neck:     Trachea: No tracheal deviation.  Cardiovascular:     Rate and Rhythm: Normal rate and regular rhythm.     Pulses: Normal pulses.     Heart sounds: Normal heart sounds. No murmur. No friction rub. No gallop.   Pulmonary:     Effort: Pulmonary effort is normal. No respiratory distress.     Breath sounds: Normal breath sounds.  Abdominal:     General: Bowel sounds are normal. There is no distension.     Palpations: Abdomen is soft. There is no mass.     Tenderness: There is no abdominal tenderness. There is no guarding or rebound.     Hernia: No hernia is present.     Comments: Obese. No incarcerated hernia.   Genitourinary:    Comments: No cva tenderness.  Musculoskeletal:     Cervical back: Normal range of motion and neck supple. No rigidity. No muscular tenderness.       Comments: Symmetric, moderate, bilateral leg edema - chronic per patient. There is no discoloration of legs/skin. Legs/feet of normal warmth and color. Pulses palp bil.   Skin:    General: Skin is warm and dry.     Findings: No rash.  Neurological:     Mental Status: She is alert.     Comments: Alert, speech normal.   Psychiatric:        Mood and Affect: Mood normal.     ED Results / Procedures / Treatments   Labs (all labs ordered are listed, but only abnormal results are displayed) Results for orders placed or performed during the hospital encounter of 06/27/19  CBC  Result Value Ref Range   WBC 6.2 4.0 - 10.5 K/uL   RBC 5.18 (H) 3.87 - 5.11 MIL/uL   Hemoglobin 14.0 12.0 - 15.0 g/dL   HCT 43.5 36.0 - 46.0 %   MCV 84.0 80.0 - 100.0 fL   MCH 27.0 26.0 - 34.0 pg   MCHC 32.2 30.0 - 36.0 g/dL   RDW 13.2 11.5 - 15.5 %   Platelets 226 150 - 400 K/uL   nRBC 0.0 0.0 - 0.2 %  Comprehensive metabolic panel  Result Value Ref Range   Sodium 139 135 - 145 mmol/L   Potassium 4.2 3.5 - 5.1 mmol/L   Chloride 106 98 - 111 mmol/L   CO2 22 22 - 32 mmol/L   Glucose, Bld 144 (H) 70 - 99 mg/dL   BUN 14 8 - 23 mg/dL   Creatinine, Ser 1.01 (H) 0.44 - 1.00 mg/dL   Calcium 9.3 8.9 - 10.3 mg/dL   Total Protein 7.2 6.5 - 8.1 g/dL   Albumin 3.6 3.5 - 5.0 g/dL   AST 20 15 - 41 U/L   ALT 18 0 - 44 U/L   Alkaline Phosphatase 63 38 - 126 U/L   Total Bilirubin  1.7 (H) 0.3 - 1.2 mg/dL   GFR calc non Af Amer 57 (L) >60 mL/min   GFR calc Af Amer >60 >60 mL/min   Anion gap 11 5 - 15  Urinalysis, Routine w reflex microscopic  Result Value Ref Range   Color, Urine YELLOW YELLOW   APPearance CLEAR CLEAR   Specific Gravity, Urine 1.017 1.005 - 1.030   pH 9.0 (H) 5.0 - 8.0   Glucose, UA NEGATIVE NEGATIVE mg/dL   Hgb urine dipstick SMALL (A) NEGATIVE   Bilirubin Urine NEGATIVE NEGATIVE   Ketones, ur NEGATIVE NEGATIVE mg/dL   Protein, ur NEGATIVE NEGATIVE mg/dL   Nitrite NEGATIVE NEGATIVE    Leukocytes,Ua LARGE (A) NEGATIVE   RBC / HPF 0-5 0 - 5 RBC/hpf   WBC, UA 0-5 0 - 5 WBC/hpf   Bacteria, UA NONE SEEN NONE SEEN   Squamous Epithelial / LPF 6-10 0 - 5  Brain natriuretic peptide  Result Value Ref Range   B Natriuretic Peptide 45.9 0.0 - 100.0 pg/mL  Troponin I (High Sensitivity)  Result Value Ref Range   Troponin I (High Sensitivity) 6 <18 ng/L  Troponin I (High Sensitivity)  Result Value Ref Range   Troponin I (High Sensitivity) 5 <18 ng/L   CT Angio Chest PE W/Cm &/Or Wo Cm  Result Date: 06/27/2019 CLINICAL DATA:  Lung going shortness of breath and chest pain for the last month or so. EXAM: CT ANGIOGRAPHY CHEST WITH CONTRAST TECHNIQUE: Multidetector CT imaging of the chest was performed using the standard protocol during bolus administration of intravenous contrast. Multiplanar CT image reconstructions and MIPs were obtained to evaluate the vascular anatomy. CONTRAST:  80mL OMNIPAQUE IOHEXOL 350 MG/ML SOLN COMPARISON:  CT angiogram chest, 11/27/2016. FINDINGS: Cardiovascular: There is satisfactory opacification of the pulmonary arteries to the segmental level. There is no evidence of a pulmonary embolism. Heart is mildly enlarged. No pericardial effusion. Minor left coronary artery calcifications. Great vessels are normal in caliber. Mediastinum/Nodes: Small to moderate hiatal hernia. No mediastinal or hilar masses. No enlarged lymph nodes. Trachea and esophagus are unremarkable. Lungs/Pleura: Patchy areas of hazy opacity are noted in the lungs. Mild interstitial thickening. No lung mass or nodule. No pleural effusion or pneumothorax. Upper Abdomen: Common bile duct and intrahepatic biliary air. Patient has had a cholecystectomy. This is chronic and was present on the prior CTA. No acute findings in the upper abdomen. Musculoskeletal: No fracture or acute finding. No osteoblastic or osteolytic lesions. Review of the MIP images confirms the above findings. IMPRESSION: 1. No evidence of  a pulmonary embolism. 2. Relatively faint patchy areas ground-glass opacity in both lungs. Suspect this is due to shunting related to small airways disease. Patchy areas edema or infection or felt less likely. 3. Mild cardiomegaly.  Small to moderate hiatal hernia. Electronically Signed   By: Lajean Manes M.D.   On: 06/27/2019 11:07   DG Chest Port 1 View  Result Date: 06/27/2019 CLINICAL DATA:  Shortness of breath, chest pain. EXAM: PORTABLE CHEST 1 VIEW COMPARISON:  08/13/2017. FINDINGS: Patient is slightly rotated. Trachea is midline. Heart size is accentuated by AP technique. Thoracic aorta is calcified. Minimal linear scarring in the medial left lower lobe. Lungs are otherwise clear. No pleural fluid. IMPRESSION: No acute findings. Electronically Signed   By: Lorin Picket M.D.   On: 06/27/2019 09:50    EKG EKG Interpretation  Date/Time:  Monday June 27 2019 09:19:32 EST Ventricular Rate:  86 PR Interval:    QRS Duration:  94 QT Interval:  380 QTC Calculation: 455 R Axis:   -53 Text Interpretation: Sinus rhythm Left axis deviation Left anterior fascicular block `no acute st/t changes compared to prior ecg Confirmed by Lajean Saver [phone]) on 06/27/2019 9:27:00 AM   Radiology CT Angio Chest PE W/Cm &/Or Wo Cm  Result Date: 06/27/2019 CLINICAL DATA:  Lung going shortness of breath and chest pain for the last month or so. EXAM: CT ANGIOGRAPHY CHEST WITH CONTRAST TECHNIQUE: Multidetector CT imaging of the chest was performed using the standard protocol during bolus administration of intravenous contrast. Multiplanar CT image reconstructions and MIPs were obtained to evaluate the vascular anatomy. CONTRAST:  3mL OMNIPAQUE IOHEXOL 350 MG/ML SOLN COMPARISON:  CT angiogram chest, 11/27/2016. FINDINGS: Cardiovascular: There is satisfactory opacification of the pulmonary arteries to the segmental level. There is no evidence of a pulmonary embolism. Heart is mildly enlarged. No pericardial  effusion. Minor left coronary artery calcifications. Great vessels are normal in caliber. Mediastinum/Nodes: Small to moderate hiatal hernia. No mediastinal or hilar masses. No enlarged lymph nodes. Trachea and esophagus are unremarkable. Lungs/Pleura: Patchy areas of hazy opacity are noted in the lungs. Mild interstitial thickening. No lung mass or nodule. No pleural effusion or pneumothorax. Upper Abdomen: Common bile duct and intrahepatic biliary air. Patient has had a cholecystectomy. This is chronic and was present on the prior CTA. No acute findings in the upper abdomen. Musculoskeletal: No fracture or acute finding. No osteoblastic or osteolytic lesions. Review of the MIP images confirms the above findings. IMPRESSION: 1. No evidence of a pulmonary embolism. 2. Relatively faint patchy areas ground-glass opacity in both lungs. Suspect this is due to shunting related to small airways disease. Patchy areas edema or infection or felt less likely. 3. Mild cardiomegaly.  Small to moderate hiatal hernia. Electronically Signed   By: Lajean Manes M.D.   On: 06/27/2019 11:07   DG Chest Port 1 View  Result Date: 06/27/2019 CLINICAL DATA:  Shortness of breath, chest pain. EXAM: PORTABLE CHEST 1 VIEW COMPARISON:  08/13/2017. FINDINGS: Patient is slightly rotated. Trachea is midline. Heart size is accentuated by AP technique. Thoracic aorta is calcified. Minimal linear scarring in the medial left lower lobe. Lungs are otherwise clear. No pleural fluid. IMPRESSION: No acute findings. Electronically Signed   By: Lorin Picket M.D.   On: 06/27/2019 09:50    Procedures Procedures (including critical care time)  Medications Ordered in ED Medications - No data to display  ED Course  I have reviewed the triage vital signs and the nursing notes.  Pertinent labs & imaging results that were available during my care of the patient were reviewed by me and considered in my medical decision making (see chart for  details).    MDM Rules/Calculators/A&P                     Iv ns. Continuous pulse ox and monitor.   Reviewed nursing notes and prior charts for additional history. Recent d-dimer elevated - will get CTA.  Labs reviewed/interpreted by me - wbc normal, hgb normal. Given recent elevated dimer - cta. Initial troponin normal. Additional labs remain pending.   CXR reviewed/interpreted by me - no pna.  CT reviewed/interpreted by me - no PE.   Additional labs reviewed/interpreted by me - after symptoms present/constant for prolonged period, trop x 2 normal and not increasing - felt not c/w ACS.  BNP is normal.  CTA is neg for PE.   On recheck, no increased  wob. Pulse ox 99% room air. Pt afebrile.   Abd is soft nt. Leg edema is chronic, symmetric.   Patient currently appears stable for d/c.   Rec pcp f/u.  Return precautions provided.       Final Clinical Impression(s) / ED Diagnoses Final diagnoses:  None    Rx / DC Orders ED Discharge Orders    None       Lajean Saver, MD 06/27/19 1324

## 2019-06-27 NOTE — Telephone Encounter (Signed)
The pt called in just wanting to let Dr. Junius Roads know she was being admitted to the hospital 06-27-19.

## 2019-06-27 NOTE — Telephone Encounter (Signed)
I have left her detailed messages on both home and cell to call me to reschedule her hospital EGD for April 6th if she is available.

## 2019-06-27 NOTE — ED Triage Notes (Signed)
Pt arrives to ED from home with complaints of ongoing shortness of breath and chest pain for the last month or so. Patient has recently noticed that her heart is racing, her legs have a more purplish tin to them, and her neck feels tight. Patient states she recently had her COVID shots which she feels like made her more SOB. Patient recently had an elevated Ddimer so PCP sent her here for CTA.

## 2019-06-28 ENCOUNTER — Encounter: Payer: Self-pay | Admitting: Family Medicine

## 2019-06-28 ENCOUNTER — Telehealth (HOSPITAL_COMMUNITY): Payer: Self-pay | Admitting: Family Medicine

## 2019-06-28 ENCOUNTER — Ambulatory Visit (HOSPITAL_COMMUNITY)
Admission: RE | Admit: 2019-06-28 | Discharge: 2019-06-28 | Disposition: A | Discharge status: Home / Self Care | Payer: PRIVATE HEALTH INSURANCE | Source: Ambulatory Visit | Attending: Family Medicine | Admitting: Family Medicine

## 2019-06-28 DIAGNOSIS — R7989 Other specified abnormal findings of blood chemistry: Secondary | ICD-10-CM

## 2019-06-28 DIAGNOSIS — R0602 Shortness of breath: Secondary | ICD-10-CM | POA: Diagnosis not present

## 2019-06-28 DIAGNOSIS — R600 Localized edema: Secondary | ICD-10-CM

## 2019-06-28 DIAGNOSIS — R0600 Dyspnea, unspecified: Secondary | ICD-10-CM

## 2019-06-28 NOTE — Telephone Encounter (Signed)
06/28/19~ Per patient, she was @ [facility] ER Dept yesterday 06/27/19 and did Labs, CT  & a series of other tests etc and was told that everything is good. Patient DO NOT want to schd STAT CTA Chest. Acct noted.

## 2019-06-28 NOTE — Progress Notes (Signed)
Lower extremity venous has been completed.   Preliminary results in CV Proc.   [name] 06/28/2019 11:16 AM

## 2019-06-29 MED ORDER — NITROFURANTOIN MONOHYD MACRO 100 MG PO CAPS: 100.0000 mg | ORAL_CAPSULE | Freq: Two times a day (BID) | ORAL | 0 refills | Status: DC

## 2019-06-29 NOTE — Addendum Note (Signed)
Addended by: Hortencia Pilar on: 06/29/2019 08:10 AM   Modules accepted: Orders

## 2019-06-29 NOTE — Telephone Encounter (Signed)
Patient sent a [name] message that she is ready to R/S. I will try to contact her later today to set this up.

## 2019-06-29 NOTE — Telephone Encounter (Signed)
I left her a detailed message on her mobile # to call me back and let me know if April 6th will work for her.

## 2019-06-30 ENCOUNTER — Other Ambulatory Visit: Payer: Self-pay | Admitting: Internal Medicine

## 2019-06-30 DIAGNOSIS — R197 Diarrhea, unspecified: Secondary | ICD-10-CM

## 2019-06-30 DIAGNOSIS — K2270 Barrett's esophagus without dysplasia: Secondary | ICD-10-CM

## 2019-06-30 DIAGNOSIS — K625 Hemorrhage of anus and rectum: Secondary | ICD-10-CM

## 2019-06-30 NOTE — Telephone Encounter (Signed)
She called back and said April 6 th will work. I will get this set up and contact her with instructions.

## 2019-06-30 NOTE — Telephone Encounter (Signed)
Patient has been set up for her ECL on 07/26/2019 at 10:15AM. I will do instructions and contact her.

## 2019-06-30 NOTE — Telephone Encounter (Signed)
I called and left her a detailed voice mail regarding her procedure and the covid testing that the hospital requires even thou she has had her vaccine.

## 2019-07-04 ENCOUNTER — Encounter: Payer: Self-pay | Admitting: Family Medicine

## 2019-07-04 DIAGNOSIS — Z86711 Personal history of pulmonary embolism: Secondary | ICD-10-CM

## 2019-07-04 NOTE — Addendum Note (Signed)
Addended by: Hortencia Pilar on: 07/04/2019 02:10 PM   Modules accepted: Orders

## 2019-07-10 ENCOUNTER — Encounter: Payer: Self-pay | Admitting: Family Medicine

## 2019-07-11 ENCOUNTER — Other Ambulatory Visit: Payer: Self-pay

## 2019-07-11 MED ORDER — NITROFURANTOIN MONOHYD MACRO 100 MG PO CAPS: 100.0000 mg | ORAL_CAPSULE | Freq: Two times a day (BID) | ORAL | 0 refills | Status: DC

## 2019-07-12 ENCOUNTER — Ambulatory Visit: Payer: PRIVATE HEALTH INSURANCE | Admitting: Internal Medicine

## 2019-07-12 ENCOUNTER — Encounter: Payer: Self-pay | Admitting: Internal Medicine

## 2019-07-12 VITALS — BP 128/60 | HR 80 | Ht 60.75 in | Wt 267.0 lb

## 2019-07-12 DIAGNOSIS — E119 Type 2 diabetes mellitus without complications: Secondary | ICD-10-CM

## 2019-07-12 DIAGNOSIS — E782 Mixed hyperlipidemia: Secondary | ICD-10-CM | POA: Diagnosis not present

## 2019-07-12 DIAGNOSIS — E669 Obesity, unspecified: Secondary | ICD-10-CM | POA: Diagnosis not present

## 2019-07-12 LAB — POCT GLYCOSYLATED HEMOGLOBIN (HGB A1C): Hemoglobin A1C: 6.6 % — AB (ref 4.0–5.6)

## 2019-07-12 LAB — LIPID PANEL
Cholesterol: 173 mg/dL (ref 0–200)
HDL: 43.7 mg/dL (ref >39.00)
LDL Cholesterol: 104 mg/dL — ABNORMAL HIGH (ref 0–99)
NonHDL: 129.77
Total CHOL/HDL Ratio: 4
Triglycerides: 131 mg/dL (ref 0.0–149.0)
VLDL: 26.2 mg/dL (ref 0.0–40.0)

## 2019-07-12 NOTE — Progress Notes (Signed)
Patient ID: [name], female   DOB: 10-31-1950, 69 y.o.   MRN: [id number]   This visit occurred during the SARS-CoV-2 public health emergency.  Safety protocols were in place, including screening questions prior to the visit, additional usage of staff PPE, and extensive cleaning of exam room while observing appropriate contact time as indicated for disinfecting solutions.   HPI  [name] is a 69 y.o.-year-old female, returning for f/u for DM2, mild, non-insulin-dependent, without long term complications. His husband is also my pt, [name]. Last visit 6 months ago.  She was in the emergency room for shortness of breath 06/27/2019.  She had a + d-dimer but no PE. She ruled out for MI. She is having an EGD next month.  DM2: -Diet controlled  Reviewed HbA1c levels: Lab Results  Component Value Date   HGBA1C 6.5 (A) 12/21/2018   HGBA1C 6.8 (H) 01/08/2018   HGBA1C 6.2 (H) 10/01/2017   HGBA1C 6.1 08/25/2017   HGBA1C 6.5 01/07/2017   HGBA1C 6.3 (H) 09/25/2016   HGBA1C 6.4 08/21/2016   HGBA1C 6.6 (H) 02/22/2016   HGBA1C 6.9 (H) 09/24/2015   Prev.: 5.9%  She checks her sugars once a day: - am: 70-120 >> 70-120, 188 (?) >> 69, 70s, 106-116 - 2h after b'fast: n/c - lunch: n/c - 2h after lunch: 126 >> 130-135 >> 128-130 >> 126-143 - dinner: n/c - 2h after dinner: 62 >> 130-135 >> 128-130s >> 120s-140 - bedtime: n/c Lowest: 69 Highest: 188 x1 (possible dehydration) >> 140s  Diet: - Breakfast: Sausage biscuit - Lunch: Meat loaf, fish, or pasta - Dinner: Small - Snacks: 1 a day: String cheese or piece of candy, 100-calorie snack  -No CKD: Lab Results  Component Value Date   BUN 14 06/27/2019   Lab Results  Component Value Date   CREATININE 1.01 (H) 06/27/2019  Not on ACE inhibitor/ARB.  -+ HL: Lab Results  Component Value Date   CHOL 194 01/08/2018   HDL 50 (L) 01/08/2018   LDLCALC 123 (H) 01/08/2018   TRIG 103 01/08/2018   CHOLHDL 3.9 01/08/2018   Not on a statin.  - last eye exam: 11/2015: No DR -She denies numbness tingling in feet  On ASA 81.  She has a family history of thyroid disease but her investigation for this was negative: Lab Results  Component Value Date   TSH 2.93 03/09/2019   TSH 2.01 01/08/2018   TSH 2.470 09/25/2016   TSH 1.99 02/22/2016   TSH 3.380 09/24/2015   Lab Results  Component Value Date   FREET4 0.76 02/22/2016   T3FREE 3.5 02/22/2016   Thyroid antibodies were normal: Component     Latest Ref Rng & Units 02/22/2016  Thyroglobulin Ab     <2 IU/mL <1  Thyroperoxidase Ab SerPl-aCnc     <9 IU/mL 1   She also has a history of Barrett's esophagus . In 12/19/2015 she had a saddle PE. She was on Eliquis >> stopped Fall 2018. She also has a history of back surgery 10/31/2015. Previous back surgery in 2005. She had multiple courses of steroids, not recently. She had L TKR in 09/2017.  She is doing lymphedema treatment.   ROS: Constitutional: no weight gain/no weight loss, no fatigue, no subjective hyperthermia, no subjective hypothermia Eyes: no blurry vision, no xerophthalmia ENT: no sore throat, no nodules palpated in neck, no dysphagia, no odynophagia, no hoarseness Cardiovascular: no CP/no SOB/no palpitations/+ leg swelling Respiratory: no cough/no SOB/no wheezing Gastrointestinal: no  N/no V/no D/no C/no acid reflux Musculoskeletal: no muscle aches/no joint aches Skin: no rashes, no hair loss Neurological: no tremors/no numbness/no tingling/no dizziness  I reviewed pt's medications, allergies, PMH, social hx, family hx, and changes were documented in the history of present illness. Otherwise, unchanged from my initial visit note.  Past Medical History:  Diagnosis Date  . Abnormal thyroid function test   . Allergy   . Anemia    she attributes previously to fibroids, she declines  . Barrett's esophagus   . Blood transfusion without reported diagnosis   . DDD (degenerative disc  disease), cervical   . DJD (degenerative joint disease)   . GERD (gastroesophageal reflux disease)   . Heart murmur   . History of hiatal hernia   . Hx of adenomatous polyp of colon 03/01/2002  . OA (osteoarthritis)   . Obese   . Paroxysmal atrial fibrillation (HCC)   . PE (pulmonary embolism) 12/21/2015  . Plantar fasciitis   . PONV (postoperative nausea and vomiting)    pt. reports that she woke up during surgery  . Pre-diabetes    Past Surgical History:  Procedure Laterality Date  . ABDOMINAL HYSTERECTOMY    . Arthroscopic surgery left knee    . BACK SURGERY     2005 ,  2017  . CESAREAN SECTION    . CHOLECYSTECTOMY    . COLONOSCOPY  2003, 2011   3 mm adenoma 2011  . ESOPHAGOGASTRODUODENOSCOPY  multiple since 2003  . IR GENERIC HISTORICAL  12/19/2015   IR ANGIOGRAM SELECTIVE EACH ADDITIONAL VESSEL 12/19/2015 [name], MD [facility]  . IR GENERIC HISTORICAL  12/19/2015   IR ANGIOGRAM PULMONARY BILATERAL SELECTIVE 12/19/2015 [name], MD [facility]  . IR GENERIC HISTORICAL  12/19/2015   IR INFUSION THROMBOL ARTERIAL INITIAL (MS) 12/19/2015 [name], MD [facility]  . IR GENERIC HISTORICAL  12/19/2015   IR US GUIDE VASC ACCESS RIGHT 12/19/2015 [name], MD [facility]  . IR GENERIC HISTORICAL  12/19/2015   IR ANGIOGRAM SELECTIVE EACH ADDITIONAL VESSEL 12/19/2015 [name], MD [facility]  . IR GENERIC HISTORICAL  12/20/2015   IR THROMB F/U EVAL ART/VEN FINAL DAY (MS) 12/20/2015 Gilmer Mor, DO [facility]  . IR GENERIC HISTORICAL  12/19/2015   IR INFUSION THROMBOL ARTERIAL INITIAL (MS) 12/19/2015 [name], MD [facility]  . PLANTAR FASCIA RELEASE     right  . Right knee replacement    . SPINAL FUSION  10/31/2015   revision at [facility]   . TONSILLECTOMY    . TOTAL KNEE ARTHROPLASTY Left 10/13/2017   Procedure: LEFT TOTAL KNEE ARTHROPLASTY;  Surgeon: Kathryne Hitch, MD;  Location: [facility] OR;  Service: Orthopedics;  Laterality:  Left;  . ULNAR TUNNEL RELEASE     right  . VENOUS ABLATION     x 2   Social History   Socioeconomic History  . Marital status: Married    Spouse name: Not on file  . Number of children: 3  . Years of education: Not on file  . Highest education level: Not on file  Occupational History    Comment: Telephone sales  Tobacco Use  . Smoking status: Never Smoker  . Smokeless tobacco: Never Used  Substance and Sexual Activity  . Alcohol use: No  . Drug use: No  . Sexual activity: Not Currently    Partners: Male    Birth control/protection: Surgical    Comment: Hysterectomy  Other Topics Concern  . Not  on file  Social History Narrative   Married.   3 children, 2 grandchildren.   Work's in a call center.   Enjoys reading, sewing, spending time with her grandchildren.   Social Determinants of Health   Financial Resource Strain:   . Difficulty of Paying Living Expenses:   Food Insecurity:   . Worried About Programme researcher, broadcasting/film/video in the Last Year:   . Barista in the Last Year:   Transportation Needs:   . Freight forwarder (Medical):   Marland Kitchen Lack of Transportation (Non-Medical):   Physical Activity:   . Days of Exercise per Week:   . Minutes of Exercise per Session:   Stress:   . Feeling of Stress :   Social Connections:   . Frequency of Communication with Friends and Family:   . Frequency of Social Gatherings with Friends and Family:   . Attends Religious Services:   . Active Member of Clubs or Organizations:   . Attends Banker Meetings:   Marland Kitchen Marital Status:   Intimate Partner Violence:   . Fear of Current or Ex-Partner:   . Emotionally Abused:   Marland Kitchen Physically Abused:   . Sexually Abused:    Current Outpatient Medications on File Prior to Visit  Medication Sig Dispense Refill  . aspirin 325 MG tablet Take 325 mg by mouth once.    . Cholecalciferol (VITAMIN D-3) 1000 units CAPS Take 1,000 Units by mouth daily in the afternoon.     . [employer] LUBRICANT  DROPS 1 % GEL Place 1 drop into both eyes 3 (three) times daily as needed (dry/irritated eyes.).   12  . diphenoxylate-atropine (LOMOTIL) 2.5-0.025 MG tablet Take 1-2 tablets by mouth 4 (four) times daily as needed for diarrhea or loose stools. 30 tablet 0  . MAGNESIUM PO Take 1 tablet by mouth daily as needed (MAGNESIUM SUPPLEMENTATION.).    Marland Kitchen nitrofurantoin, macrocrystal-monohydrate, (MACROBID) 100 MG capsule Take 1 capsule (100 mg total) by mouth 2 (two) times daily. 14 capsule 0  . Potassium 95 MG TABS Take 95 mg by mouth daily as needed (POTASSIUM SUPPLEMENT.).      No current facility-administered medications on file prior to visit.   Allergies  Allergen Reactions  . Ciprofloxacin Anaphylaxis  . Doxycycline Anaphylaxis  . Strawberry Extract Anaphylaxis  . Tetracyclines & Related Anaphylaxis   Family History  Problem Relation Age of Onset  . Stroke Maternal Grandmother   . Bladder Cancer Maternal Grandmother 50  . Esophageal cancer Maternal Grandmother 50  . Hyperthyroidism Mother        Radioactive Iodine Treatment  . Hypothyroidism Mother   . Cervical cancer Maternal Aunt 50  . Ovarian cancer Daughter 51  . Colon cancer Neg Hx   . Pancreatic cancer Neg Hx   . Rectal cancer Neg Hx   . Stomach cancer Neg Hx   . Diabetes Neg Hx     PE: BP 128/60   Pulse 80   Ht 5' 0.75" (1.543 m)   Wt 267 lb (121.1 kg)   SpO2 95%   BMI 50.87 kg/m  Wt Readings from Last 3 Encounters:  07/12/19 267 lb (121.1 kg)  05/26/19 263 lb 3.2 oz (119.4 kg)  03/09/19 265 lb 9.6 oz (120.5 kg)   Constitutional: overweight, in NAD Eyes: PERRLA, EOMI, no exophthalmos ENT: moist mucous membranes, no thyromegaly, no cervical lymphadenopathy Cardiovascular: RRR, No MRG Respiratory: CTA B Gastrointestinal: abdomen soft, NT, ND, BS+ Musculoskeletal: no deformities, strength intact  in all 4 Skin: moist, warm, no rashes Neurological: no tremor with outstretched hands, DTR normal in all  4  ASSESSMENT: 1. Non-insulin diabetes type 2, w/o long term complication  2.  Obesity class III  3. HL   PLAN:  1.  Non-insulin type 2 diabetes, without long-term complications, diet controlled -At last visit HbA1c was improved, at 6.5%, excellent -We did not start any medications at that time.  She had well-controlled blood sugars except for 1 hyperglycemic spike at 188.  At that time she was dehydrated and feeling poorly.  Discussed about the importance of staying hydrated. - At this visit, sugars are slightly higher later in the day, but still at goal - we checked her HbA1c: 6.6% (slightly higher) - advised to check sugars at different times of the day - 1x a day, rotating check times - advised for yearly eye exams >> she is not  UTD - return to clinic in 6 months  2.  Obesity class III -At last visit weight was higher by 7 pounds -Weight increased by 4 pounds since last visit -Reviewed her medication list -she takes no medicines with weight gain as a side effect  3. HL -Reviewed latest lipid panel from 12/2017: LDL above goal, triglycerides at goal, HDL low: Lab Results  Component Value Date   CHOL 194 01/08/2018   HDL 50 (L) 01/08/2018   LDLCALC 123 (H) 01/08/2018   TRIG 103 01/08/2018   CHOLHDL 3.9 01/08/2018  -She is not on a statin.  At this visit, we discussed about starting one but she would like to avoid this.  She does want to wait for the new lipid panel to return and then decide depending on the results.  Office Visit on 07/12/2019  Component Date Value Ref Range Status  . Cholesterol 07/12/2019 173  0 - 200 mg/dL Final   ATP III Classification       Desirable:  < 200 mg/dL               Borderline High:  200 - 239 mg/dL          High:  > = 253 mg/dL  . Triglycerides 07/12/2019 131.0  0.0 - 149.0 mg/dL Final   Normal:  <664 mg/dLBorderline High:  150 - 199 mg/dL  . HDL 07/12/2019 43.70  >39.00 mg/dL Final  . VLDL 40/34/7425 26.2  0.0 - 40.0 mg/dL Final  . LDL  Cholesterol 07/12/2019 104* 0 - 99 mg/dL Final  . Total CHOL/HDL Ratio 07/12/2019 4   Final                  Men          Women1/2 Average Risk     3.4          3.3Average Risk          5.0          4.42X Average Risk          9.6          7.13X Average Risk          15.0          11.0                      . NonHDL 07/12/2019 129.77   Final   NOTE:  Non-HDL goal should be 30 mg/dL higher than patient's LDL goal (i.e. LDL goal of < 70 mg/dL, would have non-HDL goal  of < 100 mg/dL)  . Hemoglobin A1C 07/12/2019 6.6* 4.0 - 5.6 % Final   LDL slightly high, above target of 100.  I will check with patient if she agrees to start a statin.  Carlus Pavlov, MD PhD [facility] Endocrinology

## 2019-07-12 NOTE — Patient Instructions (Signed)
Please come back for a follow-up appointment in 6 months.   

## 2019-07-22 ENCOUNTER — Other Ambulatory Visit (HOSPITAL_COMMUNITY)
Admission: RE | Admit: 2019-07-22 | Discharge: 2019-07-22 | Disposition: A | Discharge status: Home / Self Care | Payer: PRIVATE HEALTH INSURANCE | Source: Ambulatory Visit | Attending: Internal Medicine | Admitting: Internal Medicine

## 2019-07-22 DIAGNOSIS — Z20822 Contact with and (suspected) exposure to covid-19: Secondary | ICD-10-CM | POA: Insufficient documentation

## 2019-07-22 DIAGNOSIS — Z01812 Encounter for preprocedural laboratory examination: Secondary | ICD-10-CM | POA: Diagnosis not present

## 2019-07-22 LAB — SARS CORONAVIRUS 2 (TAT 6-24 HRS): SARS Coronavirus 2: NEGATIVE

## 2019-07-26 ENCOUNTER — Encounter (HOSPITAL_COMMUNITY): Payer: Self-pay | Admitting: Internal Medicine

## 2019-07-26 ENCOUNTER — Ambulatory Visit (HOSPITAL_COMMUNITY)
Admission: RE | Admit: 2019-07-26 | Discharge: 2019-07-26 | Disposition: A | Discharge status: Home / Self Care | Payer: No Typology Code available for payment source | Attending: Internal Medicine | Admitting: Internal Medicine

## 2019-07-26 ENCOUNTER — Ambulatory Visit (HOSPITAL_COMMUNITY): Payer: No Typology Code available for payment source | Admitting: Anesthesiology

## 2019-07-26 ENCOUNTER — Other Ambulatory Visit: Payer: Self-pay

## 2019-07-26 ENCOUNTER — Encounter (HOSPITAL_COMMUNITY)
Admission: RE | Disposition: A | Discharge status: Home / Self Care | Payer: Self-pay | Source: Home / Self Care | Attending: Internal Medicine

## 2019-07-26 DIAGNOSIS — K589 Irritable bowel syndrome without diarrhea: Secondary | ICD-10-CM | POA: Diagnosis not present

## 2019-07-26 DIAGNOSIS — K529 Noninfective gastroenteritis and colitis, unspecified: Secondary | ICD-10-CM | POA: Diagnosis not present

## 2019-07-26 DIAGNOSIS — K5730 Diverticulosis of large intestine without perforation or abscess without bleeding: Secondary | ICD-10-CM | POA: Insufficient documentation

## 2019-07-26 DIAGNOSIS — D123 Benign neoplasm of transverse colon: Secondary | ICD-10-CM

## 2019-07-26 DIAGNOSIS — Z96653 Presence of artificial knee joint, bilateral: Secondary | ICD-10-CM | POA: Insufficient documentation

## 2019-07-26 DIAGNOSIS — K2270 Barrett's esophagus without dysplasia: Secondary | ICD-10-CM | POA: Insufficient documentation

## 2019-07-26 DIAGNOSIS — R197 Diarrhea, unspecified: Secondary | ICD-10-CM

## 2019-07-26 DIAGNOSIS — Z91018 Allergy to other foods: Secondary | ICD-10-CM | POA: Insufficient documentation

## 2019-07-26 DIAGNOSIS — Z6841 Body Mass Index (BMI) 40.0 and over, adult: Secondary | ICD-10-CM | POA: Insufficient documentation

## 2019-07-26 DIAGNOSIS — Z86711 Personal history of pulmonary embolism: Secondary | ICD-10-CM | POA: Diagnosis not present

## 2019-07-26 DIAGNOSIS — Z86010 Personal history of colonic polyps: Secondary | ICD-10-CM | POA: Insufficient documentation

## 2019-07-26 DIAGNOSIS — K644 Residual hemorrhoidal skin tags: Secondary | ICD-10-CM | POA: Insufficient documentation

## 2019-07-26 DIAGNOSIS — K635 Polyp of colon: Secondary | ICD-10-CM

## 2019-07-26 DIAGNOSIS — M1990 Unspecified osteoarthritis, unspecified site: Secondary | ICD-10-CM | POA: Insufficient documentation

## 2019-07-26 DIAGNOSIS — I480 Paroxysmal atrial fibrillation: Secondary | ICD-10-CM | POA: Diagnosis not present

## 2019-07-26 DIAGNOSIS — Z881 Allergy status to other antibiotic agents status: Secondary | ICD-10-CM | POA: Insufficient documentation

## 2019-07-26 DIAGNOSIS — K449 Diaphragmatic hernia without obstruction or gangrene: Secondary | ICD-10-CM | POA: Insufficient documentation

## 2019-07-26 DIAGNOSIS — K625 Hemorrhage of anus and rectum: Secondary | ICD-10-CM

## 2019-07-26 DIAGNOSIS — N816 Rectocele: Secondary | ICD-10-CM | POA: Diagnosis not present

## 2019-07-26 HISTORY — PX: BIOPSY: SHX5522

## 2019-07-26 HISTORY — PX: POLYPECTOMY: SHX5525

## 2019-07-26 HISTORY — PX: ESOPHAGOGASTRODUODENOSCOPY (EGD) WITH PROPOFOL: SHX5813

## 2019-07-26 HISTORY — PX: COLONOSCOPY WITH PROPOFOL: SHX5780

## 2019-07-26 SURGERY — ESOPHAGOGASTRODUODENOSCOPY (EGD) WITH PROPOFOL
Anesthesia: Monitor Anesthesia Care

## 2019-07-26 MED ORDER — SODIUM CHLORIDE 0.9 % IV SOLN: INTRAVENOUS | Status: DC

## 2019-07-26 MED ORDER — LACTATED RINGERS IV SOLN
INTRAVENOUS | Status: DC
  Administered 2019-07-26: 1000 mL via INTRAVENOUS

## 2019-07-26 MED ORDER — PROPOFOL 10 MG/ML IV BOLUS
INTRAVENOUS | Status: DC | PRN
  Administered 2019-07-26: 20 mg via INTRAVENOUS
  Administered 2019-07-26: 10 mg via INTRAVENOUS
  Administered 2019-07-26: 20 mg via INTRAVENOUS
  Administered 2019-07-26: 10 mg via INTRAVENOUS

## 2019-07-26 MED ORDER — PROPOFOL 500 MG/50ML IV EMUL
INTRAVENOUS | Status: DC | PRN
  Administered 2019-07-26: 50 ug/kg/min via INTRAVENOUS

## 2019-07-26 SURGICAL SUPPLY — 25 items

## 2019-07-26 NOTE — Op Note (Addendum)
[facility] Patient Name: [name] Procedure Date: 07/26/2019 MRN: [id number] Attending MD: Iva Boop , MD Date of Birth: [birth year] CSN: [id number] Age: 69 Admit Type: Inpatient Procedure:                Colonoscopy Indications:              Chronic diarrhea Providers:                Iva Boop, MD, Glory Rosebush, RN, Wanita Chamberlain,                            Technician Referring MD:              Medicines:                Propofol per Anesthesia, Monitored Anesthesia Care Complications:            No immediate complications. Estimated Blood Loss:     Estimated blood loss was minimal. Procedure:                Pre-Anesthesia Assessment:                           - Prior to the procedure, a History and Physical                            was performed, and patient medications and                            allergies were reviewed. The patient's tolerance of                            previous anesthesia was also reviewed. The risks                            and benefits of the procedure and the sedation                            options and risks were discussed with the patient.                            All questions were answered, and informed consent                            was obtained. Prior Anticoagulants: The patient has                            taken no previous anticoagulant or antiplatelet                            agents. ASA Grade Assessment: III - A patient with                            severe systemic disease. After reviewing the risks  and benefits, the patient was deemed in                            satisfactory condition to undergo the procedure.                           - Prior to the procedure, a History and Physical                            was performed, and patient medications and                            allergies were reviewed. The patient's tolerance of                            previous  anesthesia was also reviewed. The risks                            and benefits of the procedure and the sedation                            options and risks were discussed with the patient.                            All questions were answered, and informed consent                            was obtained. Prior Anticoagulants: The patient has                            taken no previous anticoagulant or antiplatelet                            agents. ASA Grade Assessment: III - A patient with                            severe systemic disease. After reviewing the risks                            and benefits, the patient was deemed in                            satisfactory condition to undergo the procedure.                           After obtaining informed consent, the colonoscope                            was passed under direct vision. Throughout the                            procedure, the patient's blood pressure, pulse, and  oxygen saturations were monitored continuously. The                            [id number]-[id number] ([id number]) Olympus colonoscope was                            introduced through the anus and advanced to the the                            terminal ileum, with identification of the                            appendiceal orifice and IC valve. The colonoscopy                            was somewhat difficult due to significant looping.                            Successful completion of the procedure was aided by                            applying abdominal pressure. The patient tolerated                            the procedure well. The quality of the bowel                            preparation was adequate. The terminal ileum, the                            appendiceal orifice and the rectum were                            photographed. The bowel preparation used was                            Miralax via split dose instruction. Scope In:  11:04:47 AM Scope Out: 11:20:39 AM Scope Withdrawal Time: 0 hours 11 minutes 38 seconds  Total Procedure Duration: 0 hours 15 minutes 52 seconds  Findings:      The perianal exam findings include non-thrombosed external hemorrhoids       and skin tags.      The digital rectal exam findings include decreased sphincter tone +       rectocele.      Three sessile polyps were found in the transverse colon. The polyps were       diminutive in size. These polyps were removed with a cold snare.       Resection and retrieval were complete. Verification of patient       identification for the specimen was done. Estimated blood loss was       minimal.      Multiple small and large-mouthed diverticula were found in the sigmoid       colon.      The terminal ileum appeared normal.      The exam was otherwise without abnormality on direct  and retroflexion       views.      Biopsies for histology were taken with a cold forceps from the ascending       colon, transverse colon, descending colon and sigmoid colon for       evaluation of microscopic colitis. Impression:               - Non-thrombosed external hemorrhoids and perianal                            skin tags found on perianal exam.                           - Decreased sphincter tone + rectocele found on                            digital rectal exam.                           - Three diminutive polyps in the transverse colon,                            removed with a cold snare. Resected and retrieved.                           - Diverticulosis in the sigmoid colon.                           - The examined portion of the ileum was normal.                           - The examination was otherwise normal on direct                            and retroflexion views.                           - Biopsies were taken with a cold forceps from the                            ascending colon, transverse colon, descending colon                             and sigmoid colon for evaluation of microscopic                            colitis. Moderate Sedation:      Not Applicable - Patient had care per Anesthesia. Recommendation:           - Patient has a contact number available for                            emergencies. The signs and symptoms of potential                            delayed complications were discussed  with the                            patient. Return to normal activities tomorrow.                            Written discharge instructions were provided to the                            patient.                           - Resume previous diet.                           - Continue present medications.                           - Await pathology results. Still think pelvic PT                            next step                           - Repeat colonoscopy may be recommended. The                            colonoscopy date will be determined after pathology                            results from today's exam become available for                            review. Procedure Code(s):        --- Professional ---                           [phone], Colonoscopy, flexible; with removal of                            tumor(s), polyp(s), or other lesion(s) by snare                            technique                           45380, 59, Colonoscopy, flexible; with biopsy,                            single or multiple Diagnosis Code(s):        --- Professional ---                           K64.4, Residual hemorrhoidal skin tags                           K63.5, Polyp of colon  K52.9, Noninfective gastroenteritis and colitis,                            unspecified                           K57.30, Diverticulosis of large intestine without                            perforation or abscess without bleeding CPT copyright 2019 [employer]. All rights reserved. The codes documented in this  report are preliminary and upon coder review may  be revised to meet current compliance requirements. Iva Boop, MD 07/26/2019 11:42:10 AM This report has been signed electronically. Number of Addenda: 0

## 2019-07-26 NOTE — Op Note (Addendum)
[facility] Patient Name: [name] Procedure Date: 07/26/2019 MRN: [id number] Attending MD: Iva Boop , MD Date of Birth: 11-Nov-1950 CSN: [id number] Age: 69 Admit Type: Outpatient Procedure:                Upper GI endoscopy Indications:              Upper abdominal pain, Follow-up of Barrett's                            esophagus Providers:                Iva Boop, MD, Glory Rosebush, RN, Wanita Chamberlain,                            Technician Referring MD:              Medicines:                Propofol per Anesthesia, Monitored Anesthesia Care Complications:            No immediate complications. Estimated Blood Loss:     Estimated blood loss was minimal. Procedure:                Pre-Anesthesia Assessment:                           - Prior to the procedure, a History and Physical                            was performed, and patient medications and                            allergies were reviewed. The patient's tolerance of                            previous anesthesia was also reviewed. The risks                            and benefits of the procedure and the sedation                            options and risks were discussed with the patient.                            All questions were answered, and informed consent                            was obtained. Prior Anticoagulants: The patient has                            taken no previous anticoagulant or antiplatelet                            agents. ASA Grade Assessment: III - A patient with  severe systemic disease. After reviewing the risks                            and benefits, the patient was deemed in                            satisfactory condition to undergo the procedure.                           After obtaining informed consent, the endoscope was                            passed under direct vision. Throughout the                            procedure,  the patient's blood pressure, pulse, and                            oxygen saturations were monitored continuously. The                            Endoscope was introduced through the mouth, and                            advanced to the second part of duodenum. The upper                            GI endoscopy was accomplished without difficulty.                            The patient tolerated the procedure well. Scope In: Scope Out: Findings:      The esophagus and gastroesophageal junction were examined with white       light and narrow band imaging (NBI) from a forward view and retroflexed       position. There were esophageal mucosal changes secondary to established       long-segment Barrett's disease, classified as Barrett's stage C9-M10 per       [place] criteria. These changes involved the mucosa extending to an       irregular Z-line (25 cm from the incisors). Circumferential       salmon-colored mucosa was present from 26 to 35 cm, three tongues of       salmon-colored mucosa were present from 25 to 26 cm and erosion was       present at 25 cm. The maximum longitudinal extent of these esophageal       mucosal changes was 10 cm in length. Mucosa was biopsied with a cold       forceps for histology in 4 quadrants at intervals of 2 cm. A total of 5       specimen bottles were sent to pathology. Verification of patient       identification for the specimen was done. Estimated blood loss was       minimal.      A medium-sized sliding hiatal hernia was found. The proximal extent of       the gastric folds (end of tubular  esophagus) was 35 cm from the       incisors. The hiatal narrowing was 40 cm from the incisors.      The exam was otherwise without abnormality.      The cardia and gastric fundus were otherwise normal on retroflexion. Impression:               - Esophageal mucosal changes secondary to                            established long-segment Barrett's disease,                             classified as Barrett's stage C9-M10 per [place]                            criteria. Biopsied. Notihing suspicious on white                            light and NBI                           - Medium-sized sliding hiatal hernia.                           - The examination was otherwise normal. No obvious                            cause upper abdominal pain Moderate Sedation:      Not Applicable - Patient had care per Anesthesia. Recommendation:           - Patient has a contact number available for                            emergencies. The signs and symptoms of potential                            delayed complications were discussed with the                            patient. Return to normal activities tomorrow.                            Written discharge instructions were provided to the                            patient.                           - Resume previous diet.                           - Continue present medications.                           - Await pathology results.                           -  See the other procedure note for documentation of                            additional recommendations. colonoscopy next                           - may need to restart PPI - she stopped as not                            helping upper abdominal pain Procedure Code(s):        --- Professional ---                           [phone], Esophagogastroduodenoscopy, flexible,                            transoral; with biopsy, single or multiple Diagnosis Code(s):        --- Professional ---                           K22.70, Barrett's esophagus without dysplasia                           K44.9, Diaphragmatic hernia without obstruction or                            gangrene                           R10.10, Upper abdominal pain, unspecified CPT copyright 2019 American Medical Association. All rights reserved. The codes documented in this report are preliminary and upon  coder review may  be revised to meet current compliance requirements. Iva Boop, MD 07/26/2019 11:36:36 AM This report has been signed electronically. Number of Addenda: 0

## 2019-07-26 NOTE — Transfer of Care (Signed)
Immediate Anesthesia Transfer of Care Note  Patient: [name]  Procedure(s) Performed: ESOPHAGOGASTRODUODENOSCOPY (EGD) WITH PROPOFOL (N/A ) COLONOSCOPY WITH PROPOFOL (N/A ) BIOPSY POLYPECTOMY  Patient Location: PACU  Anesthesia Type:MAC  Level of Consciousness: sedated  Airway & Oxygen Therapy: Patient Spontanous Breathing and Patient connected to face mask oxygen  Post-op Assessment: Report given to RN and Post -op Vital signs reviewed and stable  Post vital signs: Reviewed and stable  Last Vitals:  Vitals Value Taken Time  BP 159/88 07/26/19 1126  Temp    Pulse 63 07/26/19 1128  Resp 13 07/26/19 1128  SpO2 100 % 07/26/19 1128  Vitals shown include unvalidated device data.  Last Pain:  Vitals:   07/26/19 1128  TempSrc: (P) Axillary  PainSc:          Complications: No apparent anesthesia complications

## 2019-07-26 NOTE — Anesthesia Postprocedure Evaluation (Signed)
Anesthesia Post Note  Patient: DIAHN WAIDELICH  Procedure(s) Performed: ESOPHAGOGASTRODUODENOSCOPY (EGD) WITH PROPOFOL (N/A ) COLONOSCOPY WITH PROPOFOL (N/A ) BIOPSY POLYPECTOMY     Patient location during evaluation: Endoscopy Anesthesia Type: MAC Level of consciousness: awake and alert Pain management: pain level controlled Vital Signs Assessment: post-procedure vital signs reviewed and stable Respiratory status: spontaneous breathing, nonlabored ventilation and respiratory function stable Cardiovascular status: blood pressure returned to baseline and stable Postop Assessment: no apparent nausea or vomiting Anesthetic complications: no    Last Vitals:  Vitals:   07/26/19 1140 07/26/19 1150  BP: (!) 161/77 (!) 174/89  Pulse: 64 (!) 57  Resp: 15 14  Temp:    SpO2: 93% 96%    Last Pain:  Vitals:   07/26/19 1150  TempSrc:   PainSc: 0-No pain                 Lidia Collum

## 2019-07-26 NOTE — Discharge Instructions (Signed)
The upper exam showed the Barrett's esophagus and a hiatal hernia. I took biopsies of the Barrett's esophagus.  The colonoscopy revealed 3 tiny polyps that I removed, diverticulosis, and was otherwise normal. I took biopsies of the colon lining to see if  you have a microscopic colitis (colon inflammation) causing your symptoms.  Once I see all the biopsy results will inform you and also of recommendations.  I appreciate the opportunity to care for you. Gatha Mayer, MD, FACG  YOU HAD AN ENDOSCOPIC PROCEDURE TODAY: Refer to the procedure report and other information in the discharge instructions given to you for any specific questions about what was found during the examination. If this information does not answer your questions, please call Dr. Celesta Aver office at [phone] to clarify.   YOU SHOULD EXPECT: Some feelings of bloating in the abdomen. Passage of more gas than usual. Walking can help get rid of the air that was put into your GI tract during the procedure and reduce the bloating. If you had a lower endoscopy (such as a colonoscopy or flexible sigmoidoscopy) you may notice spotting of blood in your stool or on the toilet paper. Some abdominal soreness may be present for a day or two, also.  DIET: Your first meal following the procedure should be a light meal and then it is ok to progress to your normal diet. A half-sandwich or bowl of soup is an example of a good first meal. Heavy or fried foods are harder to digest and may make you feel nauseous or bloated. Drink plenty of fluids but you should avoid alcoholic beverages for 24 hours.   ACTIVITY: Your care partner should take you home directly after the procedure. You should plan to take it easy, moving slowly for the rest of the day. You can resume normal activity the day after the procedure however YOU SHOULD NOT DRIVE, use power tools, machinery or perform tasks that involve climbing or major physical exertion for 24 hours (because  of the sedation medicines used during the test).   SYMPTOMS TO REPORT IMMEDIATELY: A gastroenterologist can be reached at any hour. Please call [phone]  for any of the following symptoms:  Following lower endoscopy (colonoscopy, flexible sigmoidoscopy) Excessive amounts of blood in the stool  Significant tenderness, worsening of abdominal pains  Swelling of the abdomen that is new, acute  Fever of 100 or higher  Following upper endoscopy (EGD, EUS, ERCP, esophageal dilation) Vomiting of blood or coffee ground material  New, significant abdominal pain  New, significant chest pain or pain under the shoulder blades  Painful or persistently difficult swallowing  New shortness of breath  Black, tarry-looking or red, bloody stools  FOLLOW UP:  If any biopsies were taken you will be contacted by phone or by letter within the next 1-3 weeks. Call [phone]  if you have not heard about the biopsies in 3 weeks.  Please also call with any specific questions about appointments or follow up tests.

## 2019-07-26 NOTE — H&P (Signed)
[facility] History and Physical   Primary Care Physician:  Eunice Blase, MD   Reason for Procedure:   upper abdominal pain, diarrhea  Plan:    EGD, colonoscopy     HPI: [name] is a 69 y.o. female here for evaluation of upper abdominal pain and diarrhea.   05/26/19 visit - rescheduled procedures after this because of side effects from Covid vaccine  [name] is here with continued complaints, last seen in 2018 with similar problems diagnosed with IBS and reports continued periumbilical and epigastric pain and diffuse upper abdominal hyperesthesia at times.  In addition she is rarely constipated but most of the time will have problems where she has 4 bowel movements in the morning start out normal progressively looser ending up with a fourth watery bowel movement and sometimes there are coffee grounds in it which is alarming.  I had recommended she try pelvic floor physical therapy because previous CT scanning demonstrated some pelvic floor laxity.  She had had a small capacity bladder diagnosed by Dr. Karsten Ro of urology, pelvic floor physical therapy was recommended by him as well and she did not follow through with that.  That was before I saw her.  She has a history of Barrett's which is stable without dysplasia.  Last colonoscopy Dr. Michail Sermon 2011.  No polyps then but an apparent polyp previous to that.  Sometimes she has rectal bleeding usually on the rare episodes when she is constipated.  IBgard dicyclomine have not been helpful.  Dr. Junius Roads perform stool studies with culture and C. difficile testing these were negative.  Past Medical History:  Diagnosis Date  . Abnormal thyroid function test   . Allergy   . Anemia    she attributes previously to fibroids, she declines  . Barrett's esophagus   . Blood transfusion without reported diagnosis   . DDD (degenerative disc disease), cervical   . DJD (degenerative joint disease)   . GERD (gastroesophageal reflux  disease)   . Heart murmur   . History of hiatal hernia   . Hx of adenomatous polyp of colon 03/01/2002  . OA (osteoarthritis)   . Obese   . Paroxysmal atrial fibrillation (HCC)   . PE (pulmonary embolism) 12/21/2015  . Plantar fasciitis   . PONV (postoperative nausea and vomiting)    pt. reports that she woke up during surgery  . Pre-diabetes     Past Surgical History:  Procedure Laterality Date  . ABDOMINAL HYSTERECTOMY    . Arthroscopic surgery left knee    . BACK SURGERY     2005 ,  2017  . CESAREAN SECTION    . CHOLECYSTECTOMY    . COLONOSCOPY  2003, 2011   3 mm adenoma 2011  . ESOPHAGOGASTRODUODENOSCOPY  multiple since 2003  . IR GENERIC HISTORICAL  12/19/2015   IR ANGIOGRAM SELECTIVE EACH ADDITIONAL VESSEL 12/19/2015 Greggory Keen, MD [facility]  . IR GENERIC HISTORICAL  12/19/2015   IR ANGIOGRAM PULMONARY BILATERAL SELECTIVE 12/19/2015 Greggory Keen, MD [facility]  . IR GENERIC HISTORICAL  12/19/2015   IR INFUSION THROMBOL ARTERIAL INITIAL (MS) 12/19/2015 Greggory Keen, MD [facility]  . IR GENERIC HISTORICAL  12/19/2015   IR US GUIDE VASC ACCESS RIGHT 12/19/2015 Greggory Keen, MD [facility]  . IR GENERIC HISTORICAL  12/19/2015   IR ANGIOGRAM SELECTIVE EACH ADDITIONAL VESSEL 12/19/2015 Greggory Keen, MD [facility]  . IR GENERIC HISTORICAL  12/20/2015   IR THROMB F/U EVAL ART/VEN FINAL DAY (MS) 12/20/2015 Corrie Mckusick,  DO [facility]  . IR GENERIC HISTORICAL  12/19/2015   IR INFUSION THROMBOL ARTERIAL INITIAL (MS) 12/19/2015 Greggory Keen, MD [facility]  . PLANTAR FASCIA RELEASE     right  . Right knee replacement    . SPINAL FUSION  10/31/2015   revision at [place]    . TOTAL KNEE ARTHROPLASTY Left 10/13/2017   Procedure: LEFT TOTAL KNEE ARTHROPLASTY;  Surgeon: Mcarthur Rossetti, MD;  Location: [facility];  Service: Orthopedics;  Laterality: Left;  . ULNAR TUNNEL RELEASE     right  . VENOUS ABLATION     x 2    Prior to  Admission medications   Medication Sig Start Date End Date Taking? Authorizing Provider  Cholecalciferol (VITAMIN D-3) 1000 units CAPS Take 1,000 Units by mouth daily in the afternoon.    Yes [provider]  MAGNESIUM PO Take 1 tablet by mouth daily as needed (MAGNESIUM SUPPLEMENTATION.).   Yes [provider]  Potassium 95 MG TABS Take 95 mg by mouth daily as needed (POTASSIUM SUPPLEMENT.).    Yes [provider]  [employer] LUBRICANT DROPS 1 % GEL Place 1 drop into both eyes 3 (three) times daily as needed (dry/irritated eyes.).  05/23/17   [provider]  diphenoxylate-atropine (LOMOTIL) 2.5-0.025 MG tablet Take 1-2 tablets by mouth 4 (four) times daily as needed for diarrhea or loose stools. Patient not taking: Reported on 07/20/2019 03/09/19   Hilts, Legrand Como, MD    Current Facility-Administered Medications  Medication Dose Route Frequency Provider Last Rate Last Admin  . 0.9 %  sodium chloride infusion   Intravenous Continuous Gatha Mayer, MD      . lactated ringers infusion   Intravenous Continuous Gatha Mayer, MD 10 mL/hr at 07/26/19 0952 1,000 mL at 07/26/19 0952    Allergies as of 06/30/2019 - Review Complete 06/27/2019  Allergen Reaction Noted  . Ciprofloxacin Anaphylaxis 11/11/2010  . Doxycycline Anaphylaxis 01/21/2016  . Strawberry extract Anaphylaxis 10/29/2015  . Tetracyclines & related Anaphylaxis 01/21/2016    Family History  Problem Relation Age of Onset  . Stroke Maternal Grandmother   . Bladder Cancer Maternal Grandmother 34  . Esophageal cancer Maternal Grandmother 78  . Hyperthyroidism Mother        Radioactive Iodine Treatment  . Hypothyroidism Mother   . Cervical cancer Maternal Aunt 50  . Ovarian cancer Daughter 84  . Colon cancer Neg Hx   . Pancreatic cancer Neg Hx   . Rectal cancer Neg Hx   . Stomach cancer Neg Hx   . Diabetes Neg Hx     Social History   Social History Narrative   Married.   3 children, 2  grandchildren.   Work's in a call center.   Enjoys reading, sewing, spending time with her grandchildren.     Review of Systems:  All other review of systems negative except as mentioned in the HPI.  Physical Exam: Vital signs in last 24 hours: Temp:  [98 F (36.7 C)] 98 F (36.7 C) (04/06 0917) Pulse Rate:  [73] 73 (04/06 0917) Resp:  [21] 21 (04/06 0917) BP: (164)/(86) 164/86 (04/06 0917) SpO2:  [100 %] 100 % (04/06 0917) Weight:  [117.9 kg] 117.9 kg (04/06 0917)   General:   Alert,  Well-developed, well-nourished, pleasant and cooperative in NAD - obese Lungs:  Clear throughout to auscultation.   Heart:  Regular rate and rhythm; no murmurs, clicks, rubs,  or gallops. Abdomen:  Soft, nontender and nondistended. Normal bowel sounds.  - obese Neuro/Psych:  Alert and cooperative. Normal mood and affect. A and O x 3   @Gildo Crisco  Simonne Maffucci, MD, [facility] [phone] (pager) 07/26/2019 10:09 AM@

## 2019-07-26 NOTE — Anesthesia Preprocedure Evaluation (Signed)
Anesthesia Evaluation  Patient identified by MRN, date of birth, ID band Patient awake    Reviewed: Allergy & Precautions, NPO status , Patient's Chart, lab work & pertinent test results  History of Anesthesia Complications (+) PONVNegative for: history of anesthetic complications  Airway Mallampati: III  TM Distance: >3 FB Neck ROM: Full    Dental   Metal implant anchors upper and lower:   Pulmonary PE (2017)   Pulmonary exam normal        Cardiovascular Normal cardiovascular exam+ dysrhythmias Atrial Fibrillation      Neuro/Psych negative neurological ROS  negative psych ROS   GI/Hepatic Neg liver ROS, hiatal hernia, GERD (Barrett's esophagus)  ,  Endo/Other  diabetes, Type 2Morbid obesity (BMI 47)  Renal/GU negative Renal ROS  negative genitourinary   Musculoskeletal  (+) Arthritis , Osteoarthritis,    Abdominal   Peds  Hematology negative hematology ROS (+)   Anesthesia Other Findings  Low risk stress test 2018  Echo 2018: Normal LV size with EF 55-60%. Mildly dilated RV with normal systolic function. No significant valvular abnormalities.   Reproductive/Obstetrics                             Anesthesia Physical Anesthesia Plan  ASA: III  Anesthesia Plan: MAC   Post-op Pain Management:    Induction: Intravenous  PONV Risk Score and Plan: 3 and Propofol infusion, TIVA and Treatment may vary due to age or medical condition  Airway Management Planned: Natural Airway, Nasal Cannula and Simple Face Mask  Additional Equipment: None  Intra-op Plan:   Post-operative Plan:   Informed Consent: I have reviewed the patients History and Physical, chart, labs and discussed the procedure including the risks, benefits and alternatives for the proposed anesthesia with the patient or authorized representative who has indicated his/her understanding and acceptance.       Plan  Discussed with:   Anesthesia Plan Comments:         Anesthesia Quick Evaluation

## 2019-07-27 LAB — SURGICAL PATHOLOGY

## 2019-08-05 ENCOUNTER — Encounter: Payer: Self-pay | Admitting: Family Medicine

## 2019-08-05 ENCOUNTER — Other Ambulatory Visit: Payer: Self-pay

## 2019-08-05 ENCOUNTER — Ambulatory Visit: Payer: Self-pay

## 2019-08-05 ENCOUNTER — Ambulatory Visit (INDEPENDENT_AMBULATORY_CARE_PROVIDER_SITE_OTHER): Payer: PRIVATE HEALTH INSURANCE | Admitting: Family Medicine

## 2019-08-05 DIAGNOSIS — M25552 Pain in left hip: Secondary | ICD-10-CM

## 2019-08-05 DIAGNOSIS — M25559 Pain in unspecified hip: Secondary | ICD-10-CM

## 2019-08-05 NOTE — Progress Notes (Signed)
Patient has had pain for two weeks. Walking around on the heel, unable to flex foot forward.  Pain on side of left hip and goes into groin and down the front of the leg to the knee

## 2019-08-05 NOTE — Progress Notes (Signed)
Office Visit Note   Patient: [name]           Date of Birth: Jul 26, 1950           MRN: [id number] Visit Date: 08/05/2019 Requested by: Lavada Mesi, MD [address] [place],  [place] [address] PCP: Lavada Mesi, MD  Subjective: Chief Complaint  Patient presents with  . Left Hip - Pain    HPI: She is here with left hip pain.  Symptoms started about 2 weeks ago, no injury.  Pain on the lateral hip with weightbearing, but also at rest.  No radicular symptoms.  No significant groin pain.              ROS:   All other systems were reviewed and are negative.  Objective: Vital Signs: There were no vitals taken for this visit.  Physical Exam:  General:  Alert and oriented, in no acute distress. Pulm:  Breathing unlabored. Psy:  Normal mood, congruent affect.  Left hip: Negative straight leg raise, lower extremity strength and reflexes are normal.  She has mild pain with passive internal hip rotation but maximum tenderness is over the greater trochanter.  Imaging: X-rays left hip: Moderate DJD with periarticular spurring and joint space narrowing, no sign of stress fracture.  Assessment & Plan: 1.  Left hip greater trochanter syndrome, cannot rule out upper lumbar disc protrusion. -Discussed options with her and she wants to try an injection.  If pain persist, consider MRI scan. -Out of work for 1 week.     Procedures: Left hip injection: After sterile prep with Betadine, injected 8 cc 1% lidocaine without epinephrine and 40 mg methylprednisolone into the area of maximum tenderness at the left greater trochanter.  She felt numbness in her foot after work and had difficulty walking.  She required transportation by wheelchair and assistance getting into her car due to the numbness.  She was still able to dorsiflex and evert her foot.    PMFS History: Patient Active Problem List   Diagnosis Date Noted  . Diarrhea   . B12 deficiency 01/08/2018  . Vitamin D  deficiency 01/08/2018  . History of pulmonary embolism 01/08/2018  . Status post total left knee replacement 10/13/2017  . Family history of thyroid disease 08/25/2017  . Chronic pain of left knee 05/20/2017  . Unilateral primary osteoarthritis, left knee 05/20/2017  . Morbid obesity (HCC) 04/30/2017  . Family history of ovarian cancer 02/17/2017  . IBS (irritable bowel syndrome) 01/21/2017  . Paroxysmal atrial fibrillation (HCC) 06/18/2016  . Type 2 diabetes mellitus without complication, without long-term current use of insulin (HCC) 01/21/2016  . H/O Spinal surgery 12/21/2015  . Barrett's esophagus 12/21/2015  . Hx of adenomatous polyp of colon 03/01/2002   Past Medical History:  Diagnosis Date  . Abnormal thyroid function test   . Allergy   . Anemia    she attributes previously to fibroids, she declines  . Barrett's esophagus   . Blood transfusion without reported diagnosis   . DDD (degenerative disc disease), cervical   . DJD (degenerative joint disease)   . GERD (gastroesophageal reflux disease)   . Heart murmur   . History of hiatal hernia   . Hx of adenomatous polyp of colon 03/01/2002  . OA (osteoarthritis)   . Obese   . Paroxysmal atrial fibrillation (HCC)   . PE (pulmonary embolism) 12/21/2015  . Plantar fasciitis   . PONV (postoperative nausea and vomiting)    pt. reports that she  woke up during surgery  . Pre-diabetes     Family History  Problem Relation Age of Onset  . Stroke Maternal Grandmother   . Bladder Cancer Maternal Grandmother 50  . Esophageal cancer Maternal Grandmother 50  . Hyperthyroidism Mother        Radioactive Iodine Treatment  . Hypothyroidism Mother   . Cervical cancer Maternal Aunt 50  . Ovarian cancer Daughter 15  . Colon cancer Neg Hx   . Pancreatic cancer Neg Hx   . Rectal cancer Neg Hx   . Stomach cancer Neg Hx   . Diabetes Neg Hx     Past Surgical History:  Procedure Laterality Date  . ABDOMINAL HYSTERECTOMY    .  Arthroscopic surgery left knee    . BACK SURGERY     2005 ,  2017  . BIOPSY  07/26/2019   Procedure: BIOPSY;  Surgeon: Iva Boop, MD;  Location: [facility];  Service: Endoscopy;;  . CESAREAN SECTION    . CHOLECYSTECTOMY    . COLONOSCOPY  2003, 2011   3 mm adenoma 2011  . COLONOSCOPY WITH PROPOFOL N/A 07/26/2019   Procedure: COLONOSCOPY WITH PROPOFOL;  Surgeon: Iva Boop, MD;  Location: [facility];  Service: Endoscopy;  Laterality: N/A;  . ESOPHAGOGASTRODUODENOSCOPY  multiple since 2003  . ESOPHAGOGASTRODUODENOSCOPY (EGD) WITH PROPOFOL N/A 07/26/2019   Procedure: ESOPHAGOGASTRODUODENOSCOPY (EGD) WITH PROPOFOL;  Surgeon: Iva Boop, MD;  Location: [facility];  Service: Endoscopy;  Laterality: N/A;  . IR GENERIC HISTORICAL  12/19/2015   IR ANGIOGRAM SELECTIVE EACH ADDITIONAL VESSEL 12/19/2015 [name], MD [facility]  . IR GENERIC HISTORICAL  12/19/2015   IR ANGIOGRAM PULMONARY BILATERAL SELECTIVE 12/19/2015 [name], MD [facility]  . IR GENERIC HISTORICAL  12/19/2015   IR INFUSION THROMBOL ARTERIAL INITIAL (MS) 12/19/2015 [name], MD [facility]  . IR GENERIC HISTORICAL  12/19/2015   IR US GUIDE VASC ACCESS RIGHT 12/19/2015 [name], MD [facility]  . IR GENERIC HISTORICAL  12/19/2015   IR ANGIOGRAM SELECTIVE EACH ADDITIONAL VESSEL 12/19/2015 [name], MD [facility]  . IR GENERIC HISTORICAL  12/20/2015   IR THROMB F/U EVAL ART/VEN FINAL DAY (MS) 12/20/2015 Gilmer Mor, DO [facility]  . IR GENERIC HISTORICAL  12/19/2015   IR INFUSION THROMBOL ARTERIAL INITIAL (MS) 12/19/2015 [name], MD [facility]  . PLANTAR FASCIA RELEASE     right  . POLYPECTOMY  07/26/2019   Procedure: POLYPECTOMY;  Surgeon: Iva Boop, MD;  Location: [facility];  Service: Endoscopy;;  . Right knee replacement    . SPINAL FUSION  10/31/2015   revision at [facility]   . TONSILLECTOMY    . TOTAL KNEE ARTHROPLASTY Left 10/13/2017   Procedure: LEFT  TOTAL KNEE ARTHROPLASTY;  Surgeon: Kathryne Hitch, MD;  Location: [facility];  Service: Orthopedics;  Laterality: Left;  . ULNAR TUNNEL RELEASE     right  . VENOUS ABLATION     x 2   Social History   Occupational History    Comment: Telephone sales  Tobacco Use  . Smoking status: Never Smoker  . Smokeless tobacco: Never Used  Substance and Sexual Activity  . Alcohol use: No  . Drug use: No  . Sexual activity: Not Currently    Partners: Male    Birth control/protection: Surgical    Comment: Hysterectomy

## 2019-08-09 ENCOUNTER — Telehealth: Payer: Self-pay | Admitting: Family Medicine

## 2019-08-09 NOTE — Telephone Encounter (Signed)
[place] form received via fax. Sent to [employer].

## 2019-08-10 ENCOUNTER — Encounter: Payer: Self-pay | Admitting: Family Medicine

## 2019-08-10 DIAGNOSIS — M25559 Pain in unspecified hip: Secondary | ICD-10-CM

## 2019-08-10 MED ORDER — TRAMADOL HCL 50 MG PO TABS: 50.0000 mg | ORAL_TABLET | Freq: Four times a day (QID) | ORAL | 0 refills | Status: DC | PRN

## 2019-08-10 MED ORDER — BACLOFEN 10 MG PO TABS: 5.0000 mg | ORAL_TABLET | Freq: Three times a day (TID) | ORAL | 3 refills | Status: DC | PRN

## 2019-08-10 NOTE — Addendum Note (Signed)
Addended by: Hortencia Pilar on: 08/10/2019 04:10 PM   Modules accepted: Orders

## 2019-08-12 NOTE — Addendum Note (Signed)
Addended by: Hortencia Pilar on: 08/12/2019 03:47 PM   Modules accepted: Orders

## 2019-08-24 ENCOUNTER — Encounter: Payer: Self-pay | Admitting: Family Medicine

## 2019-08-24 DIAGNOSIS — N6310 Unspecified lump in the right breast, unspecified quadrant: Secondary | ICD-10-CM

## 2019-08-25 ENCOUNTER — Other Ambulatory Visit: Payer: Self-pay | Admitting: Family Medicine

## 2019-08-25 DIAGNOSIS — N6310 Unspecified lump in the right breast, unspecified quadrant: Secondary | ICD-10-CM

## 2019-08-26 ENCOUNTER — Emergency Department (HOSPITAL_COMMUNITY)
Admission: EM | Admit: 2019-08-26 | Discharge: 2019-08-26 | Disposition: A | Discharge status: Home / Self Care | Payer: No Typology Code available for payment source | Attending: Emergency Medicine | Admitting: Emergency Medicine

## 2019-08-26 ENCOUNTER — Other Ambulatory Visit: Payer: Self-pay

## 2019-08-26 ENCOUNTER — Ambulatory Visit (HOSPITAL_BASED_OUTPATIENT_CLINIC_OR_DEPARTMENT_OTHER)
Admit: 2019-08-26 | Discharge: 2019-08-26 | Disposition: A | Discharge status: Home / Self Care | Payer: No Typology Code available for payment source | Attending: Emergency Medicine | Admitting: Emergency Medicine

## 2019-08-26 ENCOUNTER — Encounter (HOSPITAL_COMMUNITY): Payer: Self-pay

## 2019-08-26 DIAGNOSIS — E119 Type 2 diabetes mellitus without complications: Secondary | ICD-10-CM | POA: Diagnosis not present

## 2019-08-26 DIAGNOSIS — I808 Phlebitis and thrombophlebitis of other sites: Secondary | ICD-10-CM | POA: Diagnosis not present

## 2019-08-26 DIAGNOSIS — Z86711 Personal history of pulmonary embolism: Secondary | ICD-10-CM | POA: Diagnosis not present

## 2019-08-26 DIAGNOSIS — L539 Erythematous condition, unspecified: Secondary | ICD-10-CM | POA: Diagnosis present

## 2019-08-26 DIAGNOSIS — Z86718 Personal history of other venous thrombosis and embolism: Secondary | ICD-10-CM | POA: Diagnosis not present

## 2019-08-26 DIAGNOSIS — R52 Pain, unspecified: Secondary | ICD-10-CM

## 2019-08-26 DIAGNOSIS — M7989 Other specified soft tissue disorders: Secondary | ICD-10-CM

## 2019-08-26 DIAGNOSIS — Z6841 Body Mass Index (BMI) 40.0 and over, adult: Secondary | ICD-10-CM | POA: Insufficient documentation

## 2019-08-26 DIAGNOSIS — Z79899 Other long term (current) drug therapy: Secondary | ICD-10-CM | POA: Insufficient documentation

## 2019-08-26 DIAGNOSIS — B3789 Other sites of candidiasis: Secondary | ICD-10-CM | POA: Diagnosis not present

## 2019-08-26 DIAGNOSIS — R1030 Lower abdominal pain, unspecified: Secondary | ICD-10-CM | POA: Insufficient documentation

## 2019-08-26 DIAGNOSIS — R600 Localized edema: Secondary | ICD-10-CM | POA: Insufficient documentation

## 2019-08-26 DIAGNOSIS — M79601 Pain in right arm: Secondary | ICD-10-CM

## 2019-08-26 LAB — CBC WITH DIFFERENTIAL/PLATELET
Abs Immature Granulocytes: 0.02 10*3/uL (ref 0.00–0.07)
Basophils Absolute: 0.1 10*3/uL (ref 0.0–0.1)
Basophils Relative: 1 %
Eosinophils Absolute: 0.2 10*3/uL (ref 0.0–0.5)
Eosinophils Relative: 3 %
HCT: 42.6 % (ref 36.0–46.0)
Hemoglobin: 14 g/dL (ref 12.0–15.0)
Immature Granulocytes: 0 %
Lymphocytes Relative: 26 %
Lymphs Abs: 1.7 10*3/uL (ref 0.7–4.0)
MCH: 28.1 pg (ref 26.0–34.0)
MCHC: 32.9 g/dL (ref 30.0–36.0)
MCV: 85.4 fL (ref 80.0–100.0)
Monocytes Absolute: 0.4 10*3/uL (ref 0.1–1.0)
Monocytes Relative: 7 %
Neutro Abs: 4.1 10*3/uL (ref 1.7–7.7)
Neutrophils Relative %: 63 %
Platelets: 221 10*3/uL (ref 150–400)
RBC: 4.99 MIL/uL (ref 3.87–5.11)
RDW: 13.2 % (ref 11.5–15.5)
WBC: 6.5 10*3/uL (ref 4.0–10.5)
nRBC: 0 % (ref 0.0–0.2)

## 2019-08-26 LAB — COMPREHENSIVE METABOLIC PANEL
ALT: 16 U/L (ref 0–44)
AST: 17 U/L (ref 15–41)
Albumin: 3.6 g/dL (ref 3.5–5.0)
Alkaline Phosphatase: 70 U/L (ref 38–126)
Anion gap: 10 (ref 5–15)
BUN: 11 mg/dL (ref 8–23)
CO2: 26 mmol/L (ref 22–32)
Calcium: 9.4 mg/dL (ref 8.9–10.3)
Chloride: 106 mmol/L (ref 98–111)
Creatinine, Ser: 0.89 mg/dL (ref 0.44–1.00)
GFR calc Af Amer: >60 mL/min (ref >60)
GFR calc non Af Amer: >60 mL/min (ref >60)
Glucose, Bld: 110 mg/dL — ABNORMAL HIGH (ref 70–99)
Potassium: 4.3 mmol/L (ref 3.5–5.1)
Sodium: 142 mmol/L (ref 135–145)
Total Bilirubin: 1.3 mg/dL — ABNORMAL HIGH (ref 0.3–1.2)
Total Protein: 6.9 g/dL (ref 6.5–8.1)

## 2019-08-26 LAB — LACTIC ACID, PLASMA: Lactic Acid, Venous: 1.1 mmol/L (ref 0.5–1.9)

## 2019-08-26 MED ORDER — NYSTATIN 100000 UNIT/GM EX CREA: TOPICAL_CREAM | CUTANEOUS | 1 refills | Status: DC

## 2019-08-26 MED ORDER — HYDROCODONE-ACETAMINOPHEN 5-325 MG PO TABS: 1.0000 | ORAL_TABLET | Freq: Four times a day (QID) | ORAL | 0 refills | Status: DC | PRN

## 2019-08-26 MED ORDER — HYDROCODONE-ACETAMINOPHEN 5-325 MG PO TABS
2.0000 | ORAL_TABLET | Freq: Once | ORAL | Status: AC
  Administered 2019-08-26: 2 via ORAL
  Filled 2019-08-26: qty 2

## 2019-08-26 NOTE — ED Provider Notes (Signed)
[facility] EMERGENCY DEPARTMENT Provider Note   CSN: [id number] Arrival date & time: 08/26/19  1238     History Chief Complaint  Patient presents with  . Arm Pain    [name] is a 69 y.o. female.  HPI Patient reports that yesterday she noticed a swollen lymph node on the right anterior portion of her chest and upper breast.  She did a full exam and found that this was a very focal area of swelling.  Rest of the breast was not swollen or tender.  She then noted that a red streak and increasing firm little nodule started developing out towards her arm and underarm.  She then became very sore in her underarm and felt that her hand and arm were aching too.  Patient reports that she did have a IV in her right hand 1 month earlier.  That was done for an upper endoscopy.  She did not notice any problems with redness or swelling around the IV sites within the week following the procedure.  She reports she also developed some pain in her right hip a couple weeks ago and she was not sure if this was related.  He reports that that then seemed to migrate into her groin and then both groin seemed uncomfortable and aching in the high upper thigh.  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  She had a complicated course with saddle PE and required treatment with TPA.  She was on Eliquis until a year ago.  She denies she has been experiencing any chest pain or shortness of breath.  No fever no cough.  She does report she has felt a little achy and hot and cold at times.    Past Medical History:  Diagnosis Date  . Abnormal thyroid function test   . Allergy   . Anemia    she attributes previously to fibroids, she declines  . Barrett's esophagus   . Blood transfusion without reported diagnosis   . DDD (degenerative disc disease), cervical   . DJD (degenerative joint disease)   . GERD (gastroesophageal reflux disease)   . Heart murmur   . History of hiatal hernia   . Hx of adenomatous  polyp of colon 03/01/2002  . OA (osteoarthritis)   . Obese   . Paroxysmal atrial fibrillation (HCC)   . PE (pulmonary embolism) 12/21/2015  . Plantar fasciitis   . PONV (postoperative nausea and vomiting)    pt. reports that she woke up during surgery  . Pre-diabetes     Patient Active Problem List   Diagnosis Date Noted  . Diarrhea   . B12 deficiency 01/08/2018  . Vitamin D deficiency 01/08/2018  . History of pulmonary embolism 01/08/2018  . Status post total left knee replacement 10/13/2017  . Family history of thyroid disease 08/25/2017  . Chronic pain of left knee 05/20/2017  . Unilateral primary osteoarthritis, left knee 05/20/2017  . Morbid obesity ([place]) 04/30/2017  . Family history of ovarian cancer 02/17/2017  . IBS (irritable bowel syndrome) 01/21/2017  . Paroxysmal atrial fibrillation ([place]) 06/18/2016  . Type 2 diabetes mellitus without complication, without long-term current use of insulin ([place]) 01/21/2016  . H/O Spinal surgery 12/21/2015  . Barrett's esophagus 12/21/2015  . Hx of adenomatous polyp of colon 03/01/2002    Past Surgical History:  Procedure Laterality Date  . ABDOMINAL HYSTERECTOMY    . Arthroscopic surgery left knee    . BACK SURGERY     2005 ,  2017  . BIOPSY  07/26/2019   Procedure: BIOPSY;  Surgeon: Gatha Mayer, MD;  Location: [facility];  Service: Endoscopy;;  . CESAREAN SECTION    . CHOLECYSTECTOMY    . COLONOSCOPY  2003, 2011   3 mm adenoma 2011  . COLONOSCOPY WITH PROPOFOL N/A 07/26/2019   Procedure: COLONOSCOPY WITH PROPOFOL;  Surgeon: Gatha Mayer, MD;  Location: [facility];  Service: Endoscopy;  Laterality: N/A;  . ESOPHAGOGASTRODUODENOSCOPY  multiple since 2003  . ESOPHAGOGASTRODUODENOSCOPY (EGD) WITH PROPOFOL N/A 07/26/2019   Procedure: ESOPHAGOGASTRODUODENOSCOPY (EGD) WITH PROPOFOL;  Surgeon: Gatha Mayer, MD;  [facility]: [facility];  Service: Endoscopy;  Laterality: N/A;  . IR GENERIC HISTORICAL  12/19/2015   IR  ANGIOGRAM SELECTIVE EACH ADDITIONAL VESSEL 12/19/2015 Greggory Keen, MD [facility]  . IR GENERIC HISTORICAL  12/19/2015   IR ANGIOGRAM PULMONARY BILATERAL SELECTIVE 12/19/2015 Greggory Keen, MD [facility]  . IR GENERIC HISTORICAL  12/19/2015   IR INFUSION THROMBOL ARTERIAL INITIAL (MS) 12/19/2015 Greggory Keen, MD [facility]  . IR GENERIC HISTORICAL  12/19/2015   IR US GUIDE VASC ACCESS RIGHT 12/19/2015 Greggory Keen, MD [facility]  . IR GENERIC HISTORICAL  12/19/2015   IR ANGIOGRAM SELECTIVE EACH ADDITIONAL VESSEL 12/19/2015 Greggory Keen, MD [facility]  . IR GENERIC HISTORICAL  12/20/2015   IR THROMB F/U EVAL ART/VEN FINAL DAY (MS) 12/20/2015 Corrie Mckusick, DO [facility]  . IR GENERIC HISTORICAL  12/19/2015   IR INFUSION THROMBOL ARTERIAL INITIAL (MS) 12/19/2015 Greggory Keen, MD [facility]  . PLANTAR FASCIA RELEASE     right  . POLYPECTOMY  07/26/2019   Procedure: POLYPECTOMY;  Surgeon: Gatha Mayer, MD;  Location: [facility];  Service: Endoscopy;;  . Right knee replacement    . SPINAL FUSION  10/31/2015   revision at [facility]    . TOTAL KNEE ARTHROPLASTY Left 10/13/2017   Procedure: LEFT TOTAL KNEE ARTHROPLASTY;  Surgeon: Mcarthur Rossetti, MD;  Location: [facility];  Service: Orthopedics;  Laterality: Left;  . ULNAR TUNNEL RELEASE     right  . VENOUS ABLATION     x 2     OB History    Gravida  4   Para  3   Term  2   Preterm  1   AB  1   Living  3     SAB  1   TAB      Ectopic      Multiple      Live Births  3           Family History  Problem Relation Age of Onset  . Stroke Maternal Grandmother   . Bladder Cancer Maternal Grandmother 19  . Esophageal cancer Maternal Grandmother 92  . Hyperthyroidism Mother        Radioactive Iodine Treatment  . Hypothyroidism Mother   . Cervical cancer Maternal Aunt 50  . Ovarian cancer Daughter 24  . Colon cancer Neg Hx   . Pancreatic cancer Neg Hx   . Rectal cancer  Neg Hx   . Stomach cancer Neg Hx   . Diabetes Neg Hx     Social History   Tobacco Use  . Smoking status: Never Smoker  . Smokeless tobacco: Never Used  Substance Use Topics  . Alcohol use: No  . Drug use: No    Home Medications Prior to Admission medications   Medication Sig Start Date End Date Taking? Authorizing Provider  baclofen (  LIORESAL) 10 MG tablet Take 0.5-1 tablets (5-10 mg total) by mouth 3 (three) times daily as needed for muscle spasms. 08/10/19   Hilts, Legrand Como, MD  Cholecalciferol (VITAMIN D-3) 1000 units CAPS Take 1,000 Units by mouth daily in the afternoon.     [provider]  [employer] LUBRICANT DROPS 1 % GEL Place 1 drop into both eyes 3 (three) times daily as needed (dry/irritated eyes.).  05/23/17   [provider]  diphenoxylate-atropine (LOMOTIL) 2.5-0.025 MG tablet Take 1-2 tablets by mouth 4 (four) times daily as needed for diarrhea or loose stools. Patient not taking: Reported on 07/20/2019 03/09/19   Hilts, Legrand Como, MD  HYDROcodone-acetaminophen (NORCO/VICODIN) 5-325 MG tablet Take 1-2 tablets by mouth every 6 (six) hours as needed for moderate pain or severe pain. 08/26/19   Charlesetta Shanks, MD  MAGNESIUM PO Take 1 tablet by mouth daily as needed (MAGNESIUM SUPPLEMENTATION.).    [provider]  nystatin cream (MYCOSTATIN) Apply to affected area 2 times daily 08/26/19   Charlesetta Shanks, MD  Potassium 95 MG TABS Take 95 mg by mouth daily as needed (POTASSIUM SUPPLEMENT.).     [provider]  traMADol (ULTRAM) 50 MG tablet Take 1 tablet (50 mg total) by mouth every 6 (six) hours as needed. 08/10/19   Hilts, Legrand Como, MD    Allergies    Ciprofloxacin, Doxycycline, Strawberry extract, and Tetracyclines & related  Review of Systems   Review of Systems 10 Systems reviewed and are negative for acute change except as noted in the HPI.  Physical Exam Updated Vital Signs BP (!) 141/80 (BP Location: Left Arm)   Pulse 68   Temp 98.2 F  (36.8 C) (Oral)   Resp 18   Ht 5\' 2"  (1.575 m)   Wt 117.9 kg   SpO2 99%   BMI 47.55 kg/m   Physical Exam Constitutional:      Comments: Alert nontoxic.  Clinically well in appearance.  HENT:     Head: Normocephalic and atraumatic.     Mouth/Throat:     Mouth: Mucous membranes are moist.     Pharynx: Oropharynx is clear.  Eyes:     Extraocular Movements: Extraocular movements intact.     Conjunctiva/sclera: Conjunctivae normal.  Neck:     Comments: Neck is supple.  No cervical lymphadenopathy. Cardiovascular:     Rate and Rhythm: Normal rate and regular rhythm.  Pulmonary:     Effort: Pulmonary effort is normal.     Breath sounds: Normal breath sounds.     Comments: Patient's right anterior chest wall has a firm, nodular ribbon of tender superficial vein which is about 10 cm in length.  There is diffuse pink erythema over this and just to the anterior  aspect of the axilla.  No significant axillary lymphadenopathy or tenderness.  Streak does not continue down the arm.  Soft tissues of the upper arm and forearm are nontender.  Pulses 2+.  The hand is warm and dry.  No edema of the hand. Abdominal:     General: There is no distension.     Palpations: Abdomen is soft.     Tenderness: There is no abdominal tenderness. There is no guarding.     Comments: Patient has mild to moderate intertriginous yeast rash in both groin folds and beneath the pannus.  Musculoskeletal:        General: No swelling or tenderness. Normal range of motion.     Right lower leg: Edema present.  Left lower leg: Edema present.     Comments: Patient has large edema bilaterally of the lower extremities.  This appears symmetric.  She reports chronic lymphedema.  No significant tenderness of the lower portion of the calf.  He does endorse discomfort to palpation within the high medial thigh and groin bilaterally.  Radial pulses 2+ and symmetric.  Skin:    General: Skin is warm and dry.  Neurological:      General: No focal deficit present.     Mental Status: She is oriented to person, place, and time.     Coordination: Coordination normal.  Psychiatric:        Mood and Affect: Mood normal.     ED Results / Procedures / Treatments   Labs (all labs ordered are listed, but only abnormal results are displayed) Labs Reviewed  COMPREHENSIVE METABOLIC PANEL - Abnormal; Notable for the following components:      Result Value   Glucose, Bld 110 (*)    Total Bilirubin 1.3 (*)    All other components within normal limits  LACTIC ACID, PLASMA  CBC WITH DIFFERENTIAL/PLATELET  LACTIC ACID, PLASMA  PROTIME-INR    EKG None  Radiology No results found.  Procedures Procedures (including critical care time)  Medications Ordered in ED Medications  HYDROcodone-acetaminophen (NORCO/VICODIN) 5-325 MG per tablet 2 tablet (has no administration in time range)    ED Course  I have reviewed the triage vital signs and the nursing notes.  Pertinent labs & imaging results that were available during my care of the patient were reviewed by me and considered in my medical decision making (see chart for details).    MDM Rules/Calculators/A&P                     Physical exam very suggestive of superficial thrombophlebitis of the right upper chest.  This is confirmed by ultrasound.  Only apparent risk was IV start 1 months ago.  No other procedures reported.  Patient does have history of DVT and by her report massive PE with heart strain requiring thrombolytics.  She has been off Eliquis for a year.  The patient and I reviewed the risk benefits of initiating Eliquis in light of the fact that she has a superficial thrombophlebitis with DVT ruled out by ultrasound.  She feels much more comfortable restarting Eliquis due to severity of her past DVT with pulmonary embolus and she reports that it seems to have propagated fairly quickly from 1 nodule out to her axilla.  Patient is aware of the risks of Eliquis.   She is also aware that superficial thrombophlebitis does not routinely necessitate anticoagulation.  Patient also has Candida rash in the groin.  Will recommend use of nystatin cream and mechanical measures to minimize moisture in the area.  Return precautions reviewed.  Patient will be following up with her PCP to further discuss risk benefits of continued Eliquis use and monitoring for propagation of clot.  Pain control with short course of Vicodin. Final Clinical Impression(s) / ED Diagnoses Final diagnoses:  Superficial thrombophlebitis of right upper extremity  Groin pain, unspecified laterality  History of DVT (deep vein thrombosis)  Candida rash of groin    Rx / DC Orders ED Discharge Orders         Ordered    HYDROcodone-acetaminophen (NORCO/VICODIN) 5-325 MG tablet  Every 6 hours PRN     08/26/19 1939    nystatin cream (MYCOSTATIN)     08/26/19 1940  Charlesetta Shanks, MD 08/26/19 2001

## 2019-08-26 NOTE — Progress Notes (Signed)
Right upper extremity and bilateral lower extremity venous duplexes have been completed. Preliminary results can be found in CV Proc through chart review.  Results were given to Dr. Johnney Killian.  08/26/19 8:21 PM Carlos Levering RVT

## 2019-08-26 NOTE — ED Triage Notes (Signed)
Pt reports right arm pain and swelling in her axilla. States her lymph nodes and swollen and there is a red streak around her axilla going down to her arm for the past week.

## 2019-08-26 NOTE — Discharge Instructions (Addendum)
1.  You may restart your Eliquis per your home prescription.  Call your doctor on Monday to discuss the risks and benefits of taking Eliquis for superficial thrombophlebitis in your particular case. 2.  Take Vicodin as prescribed for pain.  You may apply warm compresses to help with inflammation. 3.  Avoid combining NSAIDs such as naproxen, ibuprofen, meloxicam with Eliquis due to increased bleeding risk. 4.  You have been given a prescription for nystatin to use twice daily for yeast rash in your groin area.  At times when there is increased heat and moisture, you may take clean washcloth and place them in the skin folds to avoid skin on skin contact.  This can be helpful in minimizing stimulation of moisture and heat. 5.  Return to the emergency department if you develop shortness of breath, chest pain, fever, any worsening or concerning symptoms.

## 2019-08-28 ENCOUNTER — Encounter: Payer: Self-pay | Admitting: Family Medicine

## 2019-09-02 ENCOUNTER — Other Ambulatory Visit: Payer: PRIVATE HEALTH INSURANCE

## 2019-09-10 ENCOUNTER — Encounter: Payer: Self-pay | Admitting: Family Medicine

## 2019-09-13 ENCOUNTER — Ambulatory Visit: Payer: PRIVATE HEALTH INSURANCE | Admitting: Physician Assistant

## 2019-09-13 ENCOUNTER — Encounter: Payer: Self-pay | Admitting: Orthopaedic Surgery

## 2019-09-13 ENCOUNTER — Other Ambulatory Visit: Payer: Self-pay

## 2019-09-13 DIAGNOSIS — M25552 Pain in left hip: Secondary | ICD-10-CM

## 2019-09-13 NOTE — Progress Notes (Signed)
Office Visit Note   Patient: [name]           Date of Birth: January 07, 1951           MRN: [id number] Visit Date: 09/13/2019              Requested by: Lavada Mesi, MD [address] [place],  [place] [address] PCP: Lavada Mesi, MD   Assessment & Plan: Visit Diagnoses:  1. Pain in left hip     Plan: MRI left hip to evaluate hip cartilage. Follow up after MRI to go over results. Offered intra articular injection but wants MRI and is not interested in another injection.   Follow-Up Instructions: Return after MRI.   Orders:  No orders of the defined types were placed in this encounter.  No orders of the defined types were placed in this encounter.     Procedures: No procedures performed   Clinical Data: No additional findings.   Subjective: Chief Complaint  Patient presents with  . Left Knee - Pain  . Left Hip - Pain    HPI Patient comes in today due to left hip pain. Was seen by Dr. Prince Rome 08/05/19 given trochanteric injection left hip without any relief. Having left lateral hip that goes obliquely across here left thigh then travels down her left leg along the medial side into her foot. Denies any back pain. No acute injury. States that pain is coming from her hip not her back.  Pain described as knife like pain. Pain awakens her at night. Occurs when sitting , standing or lying down. Has taken a prednisone pak with minimal relief. Ambulates with a cane.  Radiographs left hip 08/05/19 showed no acute findings. Mild narrowing of hip joint. Periarticular spurring.   Review of Systems See HPI  Objective: Vital Signs: There were no vitals taken for this visit.  Physical Exam Constitutional:      Appearance: She is not ill-appearing or diaphoretic.  Pulmonary:     Effort: Pulmonary effort is normal.  Neurological:     Mental Status: She is alert and oriented to person, place, and time.  Psychiatric:        Behavior: Behavior normal.     Ortho  Exam Weakness 4/5 left great toe extension against resistance. Otherwise lower extremity strengths 5/5 bilaterally. Good range of motion bilateral hips, extremal rotation left hip causes discomfort.Tenderness over left trochanteric region. Positive straight leg raise on the left. Tenderness over left lower lumbar Paraspinous region.     Specialty Comments:  No specialty comments available.  Imaging: No results found.   PMFS History: Patient Active Problem List   Diagnosis Date Noted  . Diarrhea   . B12 deficiency 01/08/2018  . Vitamin D deficiency 01/08/2018  . History of pulmonary embolism 01/08/2018  . Status post total left knee replacement 10/13/2017  . Family history of thyroid disease 08/25/2017  . Chronic pain of left knee 05/20/2017  . Unilateral primary osteoarthritis, left knee 05/20/2017  . Morbid obesity (HCC) 04/30/2017  . Family history of ovarian cancer 02/17/2017  . IBS (irritable bowel syndrome) 01/21/2017  . Paroxysmal atrial fibrillation (HCC) 06/18/2016  . Type 2 diabetes mellitus without complication, without long-term current use of insulin (HCC) 01/21/2016  . H/O Spinal surgery 12/21/2015  . Barrett's esophagus 12/21/2015  . Hx of adenomatous polyp of colon 03/01/2002   Past Medical History:  Diagnosis Date  . Abnormal thyroid function test   . Allergy   . Anemia  she attributes previously to fibroids, she declines  . Barrett's esophagus   . Blood transfusion without reported diagnosis   . DDD (degenerative disc disease), cervical   . DJD (degenerative joint disease)   . GERD (gastroesophageal reflux disease)   . Heart murmur   . History of hiatal hernia   . Hx of adenomatous polyp of colon 03/01/2002  . OA (osteoarthritis)   . Obese   . Paroxysmal atrial fibrillation (HCC)   . PE (pulmonary embolism) 12/21/2015  . Plantar fasciitis   . PONV (postoperative nausea and vomiting)    pt. reports that she woke up during surgery  . Pre-diabetes      Family History  Problem Relation Age of Onset  . Stroke Maternal Grandmother   . Bladder Cancer Maternal Grandmother 50  . Esophageal cancer Maternal Grandmother 50  . Hyperthyroidism Mother        Radioactive Iodine Treatment  . Hypothyroidism Mother   . Cervical cancer Maternal Aunt 50  . Ovarian cancer Daughter 6  . Colon cancer Neg Hx   . Pancreatic cancer Neg Hx   . Rectal cancer Neg Hx   . Stomach cancer Neg Hx   . Diabetes Neg Hx     Past Surgical History:  Procedure Laterality Date  . ABDOMINAL HYSTERECTOMY    . Arthroscopic surgery left knee    . BACK SURGERY     2005 ,  2017  . BIOPSY  07/26/2019   Procedure: BIOPSY;  Surgeon: Iva Boop, MD;  Location: [facility];  Service: Endoscopy;;  . CESAREAN SECTION    . CHOLECYSTECTOMY    . COLONOSCOPY  2003, 2011   3 mm adenoma 2011  . COLONOSCOPY WITH PROPOFOL N/A 07/26/2019   Procedure: COLONOSCOPY WITH PROPOFOL;  Surgeon: Iva Boop, MD;  Location: [facility];  Service: Endoscopy;  Laterality: N/A;  . ESOPHAGOGASTRODUODENOSCOPY  multiple since 2003  . ESOPHAGOGASTRODUODENOSCOPY (EGD) WITH PROPOFOL N/A 07/26/2019   Procedure: ESOPHAGOGASTRODUODENOSCOPY (EGD) WITH PROPOFOL;  Surgeon: Iva Boop, MD;  Location: [facility];  Service: Endoscopy;  Laterality: N/A;  . IR GENERIC HISTORICAL  12/19/2015   IR ANGIOGRAM SELECTIVE EACH ADDITIONAL VESSEL 12/19/2015 [name], MD [facility]  . IR GENERIC HISTORICAL  12/19/2015   IR ANGIOGRAM PULMONARY BILATERAL SELECTIVE 12/19/2015 [name], MD [facility]  . IR GENERIC HISTORICAL  12/19/2015   IR INFUSION THROMBOL ARTERIAL INITIAL (MS) 12/19/2015 [name], MD [facility]  . IR GENERIC HISTORICAL  12/19/2015   IR US GUIDE VASC ACCESS RIGHT 12/19/2015 [name], MD [facility]  . IR GENERIC HISTORICAL  12/19/2015   IR ANGIOGRAM SELECTIVE EACH ADDITIONAL VESSEL 12/19/2015 [name], MD [facility]  . IR GENERIC HISTORICAL  12/20/2015   IR  THROMB F/U EVAL ART/VEN FINAL DAY (MS) 12/20/2015 Gilmer Mor, DO [facility]  . IR GENERIC HISTORICAL  12/19/2015   IR INFUSION THROMBOL ARTERIAL INITIAL (MS) 12/19/2015 [name], MD [facility]  . PLANTAR FASCIA RELEASE     right  . POLYPECTOMY  07/26/2019   Procedure: POLYPECTOMY;  Surgeon: Iva Boop, MD;  Location: [facility];  Service: Endoscopy;;  . Right knee replacement    . SPINAL FUSION  10/31/2015   revision at [facility]   . TONSILLECTOMY    . TOTAL KNEE ARTHROPLASTY Left 10/13/2017   Procedure: LEFT TOTAL KNEE ARTHROPLASTY;  Surgeon: Kathryne Hitch, MD;  Location: [facility];  Service: Orthopedics;  Laterality: Left;  . ULNAR TUNNEL RELEASE  right  . VENOUS ABLATION     x 2   Social History   Occupational History    Comment: Telephone sales  Tobacco Use  . Smoking status: Never Smoker  . Smokeless tobacco: Never Used  Substance and Sexual Activity  . Alcohol use: No  . Drug use: No  . Sexual activity: Not Currently    Partners: Male    Birth control/protection: Surgical    Comment: Hysterectomy

## 2019-09-16 ENCOUNTER — Other Ambulatory Visit: Payer: Self-pay

## 2019-09-16 ENCOUNTER — Ambulatory Visit: Payer: PRIVATE HEALTH INSURANCE | Attending: Family Medicine

## 2019-09-16 DIAGNOSIS — R2689 Other abnormalities of gait and mobility: Secondary | ICD-10-CM | POA: Insufficient documentation

## 2019-09-16 DIAGNOSIS — M25552 Pain in left hip: Secondary | ICD-10-CM | POA: Diagnosis present

## 2019-09-16 NOTE — Therapy (Signed)
[facility] [address], [place], [address] Phone: [phone]   Fax:  [phone]  Physical Therapy Evaluation  Patient Details  Name: [name] MRN: [id number] Date of Birth: 09-08-1950 Referring Provider (PT): Eunice Blase, MD Zollie Beckers?)   Encounter Date: 09/16/2019  PT End of Session - 09/16/19 1006    Visit Number  1    Number of Visits  16    Date for PT Re-Evaluation  11/11/19    Authorization Type  [employer]    Authorization Time Period  09/16/19-11/11/19    PT Start Time  0810    PT Stop Time  0915    PT Time Calculation (min)  65 min    Activity Tolerance  Patient limited by pain    Behavior During Therapy  [facility] for tasks assessed/performed       Past Medical History:  Diagnosis Date  . Abnormal thyroid function test   . Allergy   . Anemia    she attributes previously to fibroids, she declines  . Barrett's esophagus   . Blood transfusion without reported diagnosis   . DDD (degenerative disc disease), cervical   . DJD (degenerative joint disease)   . GERD (gastroesophageal reflux disease)   . Heart murmur   . History of hiatal hernia   . Hx of adenomatous polyp of colon 03/01/2002  . OA (osteoarthritis)   . Obese   . Paroxysmal atrial fibrillation (HCC)   . PE (pulmonary embolism) 12/21/2015  . Plantar fasciitis   . PONV (postoperative nausea and vomiting)    pt. reports that she woke up during surgery  . Pre-diabetes     Past Surgical History:  Procedure Laterality Date  . ABDOMINAL HYSTERECTOMY    . Arthroscopic surgery left knee    . BACK SURGERY     2005 ,  2017  . BIOPSY  07/26/2019   Procedure: BIOPSY;  Surgeon: Gatha Mayer, MD;  Location: [facility];  Service: Endoscopy;;  . CESAREAN SECTION    . CHOLECYSTECTOMY    . COLONOSCOPY  2003, 2011   3 mm adenoma 2011  . COLONOSCOPY WITH PROPOFOL N/A 07/26/2019   Procedure: COLONOSCOPY WITH PROPOFOL;  Surgeon:  Gatha Mayer, MD;  Location: [facility];  Service: Endoscopy;  Laterality: N/A;  . ESOPHAGOGASTRODUODENOSCOPY  multiple since 2003  . ESOPHAGOGASTRODUODENOSCOPY (EGD) WITH PROPOFOL N/A 07/26/2019   Procedure: ESOPHAGOGASTRODUODENOSCOPY (EGD) WITH PROPOFOL;  Surgeon: Gatha Mayer, MD;  Location: [facility];  Service: Endoscopy;  Laterality: N/A;  . IR GENERIC HISTORICAL  12/19/2015   IR ANGIOGRAM SELECTIVE EACH ADDITIONAL VESSEL 12/19/2015 Greggory Keen, MD [facility]  . IR GENERIC HISTORICAL  12/19/2015   IR ANGIOGRAM PULMONARY BILATERAL SELECTIVE 12/19/2015 Greggory Keen, MD [facility]  . IR GENERIC HISTORICAL  12/19/2015   IR INFUSION THROMBOL ARTERIAL INITIAL (MS) 12/19/2015 Greggory Keen, MD [facility]  . IR GENERIC HISTORICAL  12/19/2015   IR US GUIDE VASC ACCESS RIGHT 12/19/2015 Greggory Keen, MD [facility]  . IR GENERIC HISTORICAL  12/19/2015   IR ANGIOGRAM SELECTIVE EACH ADDITIONAL VESSEL 12/19/2015 Greggory Keen, MD [facility]  . IR GENERIC HISTORICAL  12/20/2015   IR THROMB F/U EVAL ART/VEN FINAL DAY (MS) 12/20/2015 Corrie Mckusick, DO [facility]  . IR GENERIC HISTORICAL  12/19/2015   IR INFUSION THROMBOL ARTERIAL INITIAL (MS) 12/19/2015 Greggory Keen, MD [facility]  . PLANTAR FASCIA RELEASE     right  . POLYPECTOMY  end-stage hip OA. Pt will benefit from skilled PT intervention to address hypomobility, strength impairment, and limited functional activity tolerance to help restore pt to baseline level of function in ADL/IADL.    Personal Factors and Comorbidities  Age;Behavior Pattern;Comorbidity 1;Fitness;Past/Current Experience    Comorbidities  Hx of back surgery    Examination-Activity Limitations  Bathing;Bed Mobility;Squat;Stairs;Bend;Locomotion Level;Reach Overhead;Toileting;Stand;Carry;Transfers    Examination-Participation Restrictions  Other;Community Activity;Interpersonal Relationship   basketball   Stability/Clinical Decision Making  Evolving/Moderate complexity    Rehab Potential  Fair    PT Frequency  2x / week    PT Duration  8 weeks    PT Treatment/Interventions  ADLs/Self Care Home  Management;Electrical Stimulation;Gait training;Stair training;Functional mobility training;Therapeutic activities;Therapeutic exercise;Balance training;Neuromuscular re-education;Cognitive remediation;Patient/family education;Passive range of motion    PT Next Visit Plan  Establish HEP for pain self management    PT Home Exercise Plan  none this date    Consulted and Agree with Plan of Care  Patient       Patient will benefit from skilled therapeutic intervention in order to improve the following deficits and impairments:  Decreased balance, Abnormal gait, Decreased mobility, Decreased activity tolerance, Decreased range of motion, Difficulty walking, Obesity  Visit Diagnosis: Pain in left hip  Other abnormalities of gait and mobility     Problem List Patient Active Problem List   Diagnosis Date Noted  . Diarrhea   . B12 deficiency 01/08/2018  . Vitamin D deficiency 01/08/2018  . History of pulmonary embolism 01/08/2018  . Status post total left knee replacement 10/13/2017  . Family history of thyroid disease 08/25/2017  . Chronic pain of left knee 05/20/2017  . Unilateral primary osteoarthritis, left knee 05/20/2017  . Morbid obesity ([place]) 04/30/2017  . Family history of ovarian cancer 02/17/2017  . IBS (irritable bowel syndrome) 01/21/2017  . Paroxysmal atrial fibrillation ([place]) 06/18/2016  . Type 2 diabetes mellitus without complication, without long-term current use of insulin ([place]) 01/21/2016  . H/O Spinal surgery 12/21/2015  . Barrett's esophagus 12/21/2015  . Hx of adenomatous polyp of colon 03/01/2002   12:43 PM, 09/16/19 Etta Grandchild, PT, DPT Physical Therapist - [place] [phone] (Office)   Buccola,Allan C 09/16/2019, 12:38 PM  [facility] [address] [place], [address] Phone: [phone]   Fax:  [phone]  Name: DEZRAE GILLOCK MRN: [id number] Date of Birth:  [birth year]  end-stage hip OA. Pt will benefit from skilled PT intervention to address hypomobility, strength impairment, and limited functional activity tolerance to help restore pt to baseline level of function in ADL/IADL.    Personal Factors and Comorbidities  Age;Behavior Pattern;Comorbidity 1;Fitness;Past/Current Experience    Comorbidities  Hx of back surgery    Examination-Activity Limitations  Bathing;Bed Mobility;Squat;Stairs;Bend;Locomotion Level;Reach Overhead;Toileting;Stand;Carry;Transfers    Examination-Participation Restrictions  Other;Community Activity;Interpersonal Relationship   basketball   Stability/Clinical Decision Making  Evolving/Moderate complexity    Rehab Potential  Fair    PT Frequency  2x / week    PT Duration  8 weeks    PT Treatment/Interventions  ADLs/Self Care Home  Management;Electrical Stimulation;Gait training;Stair training;Functional mobility training;Therapeutic activities;Therapeutic exercise;Balance training;Neuromuscular re-education;Cognitive remediation;Patient/family education;Passive range of motion    PT Next Visit Plan  Establish HEP for pain self management    PT Home Exercise Plan  none this date    Consulted and Agree with Plan of Care  Patient       Patient will benefit from skilled therapeutic intervention in order to improve the following deficits and impairments:  Decreased balance, Abnormal gait, Decreased mobility, Decreased activity tolerance, Decreased range of motion, Difficulty walking, Obesity  Visit Diagnosis: Pain in left hip  Other abnormalities of gait and mobility     Problem List Patient Active Problem List   Diagnosis Date Noted  . Diarrhea   . B12 deficiency 01/08/2018  . Vitamin D deficiency 01/08/2018  . History of pulmonary embolism 01/08/2018  . Status post total left knee replacement 10/13/2017  . Family history of thyroid disease 08/25/2017  . Chronic pain of left knee 05/20/2017  . Unilateral primary osteoarthritis, left knee 05/20/2017  . Morbid obesity ([place]) 04/30/2017  . Family history of ovarian cancer 02/17/2017  . IBS (irritable bowel syndrome) 01/21/2017  . Paroxysmal atrial fibrillation ([place]) 06/18/2016  . Type 2 diabetes mellitus without complication, without long-term current use of insulin ([place]) 01/21/2016  . H/O Spinal surgery 12/21/2015  . Barrett's esophagus 12/21/2015  . Hx of adenomatous polyp of colon 03/01/2002   12:43 PM, 09/16/19 Etta Grandchild, PT, DPT Physical Therapist - [place] [phone] (Office)   Buccola,Allan C 09/16/2019, 12:38 PM  [facility] [address] [place], [address] Phone: [phone]   Fax:  [phone]  Name: DEZRAE GILLOCK MRN: [id number] Date of Birth:  [birth year]  [facility] [address], [place], [address] Phone: [phone]   Fax:  [phone]  Physical Therapy Evaluation  Patient Details  Name: [name] MRN: [id number] Date of Birth: 09-08-1950 Referring Provider (PT): Eunice Blase, MD Zollie Beckers?)   Encounter Date: 09/16/2019  PT End of Session - 09/16/19 1006    Visit Number  1    Number of Visits  16    Date for PT Re-Evaluation  11/11/19    Authorization Type  [employer]    Authorization Time Period  09/16/19-11/11/19    PT Start Time  0810    PT Stop Time  0915    PT Time Calculation (min)  65 min    Activity Tolerance  Patient limited by pain    Behavior During Therapy  [facility] for tasks assessed/performed       Past Medical History:  Diagnosis Date  . Abnormal thyroid function test   . Allergy   . Anemia    she attributes previously to fibroids, she declines  . Barrett's esophagus   . Blood transfusion without reported diagnosis   . DDD (degenerative disc disease), cervical   . DJD (degenerative joint disease)   . GERD (gastroesophageal reflux disease)   . Heart murmur   . History of hiatal hernia   . Hx of adenomatous polyp of colon 03/01/2002  . OA (osteoarthritis)   . Obese   . Paroxysmal atrial fibrillation (HCC)   . PE (pulmonary embolism) 12/21/2015  . Plantar fasciitis   . PONV (postoperative nausea and vomiting)    pt. reports that she woke up during surgery  . Pre-diabetes     Past Surgical History:  Procedure Laterality Date  . ABDOMINAL HYSTERECTOMY    . Arthroscopic surgery left knee    . BACK SURGERY     2005 ,  2017  . BIOPSY  07/26/2019   Procedure: BIOPSY;  Surgeon: Gatha Mayer, MD;  Location: [facility];  Service: Endoscopy;;  . CESAREAN SECTION    . CHOLECYSTECTOMY    . COLONOSCOPY  2003, 2011   3 mm adenoma 2011  . COLONOSCOPY WITH PROPOFOL N/A 07/26/2019   Procedure: COLONOSCOPY WITH PROPOFOL;  Surgeon:  Gatha Mayer, MD;  Location: [facility];  Service: Endoscopy;  Laterality: N/A;  . ESOPHAGOGASTRODUODENOSCOPY  multiple since 2003  . ESOPHAGOGASTRODUODENOSCOPY (EGD) WITH PROPOFOL N/A 07/26/2019   Procedure: ESOPHAGOGASTRODUODENOSCOPY (EGD) WITH PROPOFOL;  Surgeon: Gatha Mayer, MD;  Location: [facility];  Service: Endoscopy;  Laterality: N/A;  . IR GENERIC HISTORICAL  12/19/2015   IR ANGIOGRAM SELECTIVE EACH ADDITIONAL VESSEL 12/19/2015 Greggory Keen, MD [facility]  . IR GENERIC HISTORICAL  12/19/2015   IR ANGIOGRAM PULMONARY BILATERAL SELECTIVE 12/19/2015 Greggory Keen, MD [facility]  . IR GENERIC HISTORICAL  12/19/2015   IR INFUSION THROMBOL ARTERIAL INITIAL (MS) 12/19/2015 Greggory Keen, MD [facility]  . IR GENERIC HISTORICAL  12/19/2015   IR US GUIDE VASC ACCESS RIGHT 12/19/2015 Greggory Keen, MD [facility]  . IR GENERIC HISTORICAL  12/19/2015   IR ANGIOGRAM SELECTIVE EACH ADDITIONAL VESSEL 12/19/2015 Greggory Keen, MD [facility]  . IR GENERIC HISTORICAL  12/20/2015   IR THROMB F/U EVAL ART/VEN FINAL DAY (MS) 12/20/2015 Corrie Mckusick, DO [facility]  . IR GENERIC HISTORICAL  12/19/2015   IR INFUSION THROMBOL ARTERIAL INITIAL (MS) 12/19/2015 Greggory Keen, MD [facility]  . PLANTAR FASCIA RELEASE     right  . POLYPECTOMY  [facility] [address], [place], [address] Phone: [phone]   Fax:  [phone]  Physical Therapy Evaluation  Patient Details  Name: [name] MRN: [id number] Date of Birth: 09-08-1950 Referring Provider (PT): Eunice Blase, MD Zollie Beckers?)   Encounter Date: 09/16/2019  PT End of Session - 09/16/19 1006    Visit Number  1    Number of Visits  16    Date for PT Re-Evaluation  11/11/19    Authorization Type  [employer]    Authorization Time Period  09/16/19-11/11/19    PT Start Time  0810    PT Stop Time  0915    PT Time Calculation (min)  65 min    Activity Tolerance  Patient limited by pain    Behavior During Therapy  [facility] for tasks assessed/performed       Past Medical History:  Diagnosis Date  . Abnormal thyroid function test   . Allergy   . Anemia    she attributes previously to fibroids, she declines  . Barrett's esophagus   . Blood transfusion without reported diagnosis   . DDD (degenerative disc disease), cervical   . DJD (degenerative joint disease)   . GERD (gastroesophageal reflux disease)   . Heart murmur   . History of hiatal hernia   . Hx of adenomatous polyp of colon 03/01/2002  . OA (osteoarthritis)   . Obese   . Paroxysmal atrial fibrillation (HCC)   . PE (pulmonary embolism) 12/21/2015  . Plantar fasciitis   . PONV (postoperative nausea and vomiting)    pt. reports that she woke up during surgery  . Pre-diabetes     Past Surgical History:  Procedure Laterality Date  . ABDOMINAL HYSTERECTOMY    . Arthroscopic surgery left knee    . BACK SURGERY     2005 ,  2017  . BIOPSY  07/26/2019   Procedure: BIOPSY;  Surgeon: Gatha Mayer, MD;  Location: [facility];  Service: Endoscopy;;  . CESAREAN SECTION    . CHOLECYSTECTOMY    . COLONOSCOPY  2003, 2011   3 mm adenoma 2011  . COLONOSCOPY WITH PROPOFOL N/A 07/26/2019   Procedure: COLONOSCOPY WITH PROPOFOL;  Surgeon:  Gatha Mayer, MD;  Location: [facility];  Service: Endoscopy;  Laterality: N/A;  . ESOPHAGOGASTRODUODENOSCOPY  multiple since 2003  . ESOPHAGOGASTRODUODENOSCOPY (EGD) WITH PROPOFOL N/A 07/26/2019   Procedure: ESOPHAGOGASTRODUODENOSCOPY (EGD) WITH PROPOFOL;  Surgeon: Gatha Mayer, MD;  Location: [facility];  Service: Endoscopy;  Laterality: N/A;  . IR GENERIC HISTORICAL  12/19/2015   IR ANGIOGRAM SELECTIVE EACH ADDITIONAL VESSEL 12/19/2015 Gre[facility] Keen, MD [facility]  . IR GENERIC HISTORICAL  12/19/2015   IR ANGIOGRAM PULMONARY BILATERAL SELECTIVE 12/19/2015 Greggory Keen, MD [facility]  . IR GENERIC HISTORICAL  12/19/2015   IR INFUSION THROMBOL ARTERIAL INITIAL (MS) 12/19/2015 Greggory Keen, MD [facility]  . IR GENERIC HISTORICAL  12/19/2015   IR US GUIDE VASC ACCESS RIGHT 12/19/2015 Greggory Keen, MD [facility]  . IR GENERIC HISTORICAL  12/19/2015   IR ANGIOGRAM SELECTIVE EACH ADDITIONAL VESSEL 12/19/2015 Greggory Keen, MD [facility]  . IR GENERIC HISTORICAL  12/20/2015   IR THROMB F/U EVAL ART/VEN FINAL DAY (MS) 12/20/2015 Corrie Mckusick, DO [facility]  . IR GENERIC HISTORICAL  12/19/2015   IR INFUSION THROMBOL ARTERIAL INITIAL (MS) 12/19/2015 Greggory Keen, MD [facility]  . PLANTAR FASCIA RELEASE     right  . POLYPECTOMY  [facility] [address], [place], [address] Phone: [phone]   Fax:  [phone]  Physical Therapy Evaluation  Patient Details  Name: [name] MRN: [id number] Date of Birth: 09-08-1950 Referring Provider (PT): Eunice Blase, MD Zollie Beckers?)   Encounter Date: 09/16/2019  PT End of Session - 09/16/19 1006    Visit Number  1    Number of Visits  16    Date for PT Re-Evaluation  11/11/19    Authorization Type  [employer]    Authorization Time Period  09/16/19-11/11/19    PT Start Time  0810    PT Stop Time  0915    PT Time Calculation (min)  65 min    Activity Tolerance  Patient limited by pain    Behavior During Therapy  [facility] for tasks assessed/performed       Past Medical History:  Diagnosis Date  . Abnormal thyroid function test   . Allergy   . Anemia    she attributes previously to fibroids, she declines  . Barrett's esophagus   . Blood transfusion without reported diagnosis   . DDD (degenerative disc disease), cervical   . DJD (degenerative joint disease)   . GERD (gastroesophageal reflux disease)   . Heart murmur   . History of hiatal hernia   . Hx of adenomatous polyp of colon 03/01/2002  . OA (osteoarthritis)   . Obese   . Paroxysmal atrial fibrillation (HCC)   . PE (pulmonary embolism) 12/21/2015  . Plantar fasciitis   . PONV (postoperative nausea and vomiting)    pt. reports that she woke up during surgery  . Pre-diabetes     Past Surgical History:  Procedure Laterality Date  . ABDOMINAL HYSTERECTOMY    . Arthroscopic surgery left knee    . BACK SURGERY     2005 ,  2017  . BIOPSY  07/26/2019   Procedure: BIOPSY;  Surgeon: Gatha Mayer, MD;  Location: [facility];  Service: Endoscopy;;  . CESAREAN SECTION    . CHOLECYSTECTOMY    . COLONOSCOPY  2003, 2011   3 mm adenoma 2011  . COLONOSCOPY WITH PROPOFOL N/A 07/26/2019   Procedure: COLONOSCOPY WITH PROPOFOL;  Surgeon:  Gatha Mayer, MD;  Location: [facility];  Service: Endoscopy;  Laterality: N/A;  . ESOPHAGOGASTRODUODENOSCOPY  multiple since 2003  . ESOPHAGOGASTRODUODENOSCOPY (EGD) WITH PROPOFOL N/A 07/26/2019   Procedure: ESOPHAGOGASTRODUODENOSCOPY (EGD) WITH PROPOFOL;  Surgeon: Gatha Mayer, MD;  Location: [facility];  Service: Endoscopy;  Laterality: N/A;  . IR GENERIC HISTORICAL  12/19/2015   IR ANGIOGRAM SELECTIVE EACH ADDITIONAL VESSEL 12/19/2015 Greggory Keen, MD [facility]  . IR GENERIC HISTORICAL  12/19/2015   IR ANGIOGRAM PULMONARY BILATERAL SELECTIVE 12/19/2015 Greggory Keen, MD [facility]  . IR GENERIC HISTORICAL  12/19/2015   IR INFUSION THROMBOL ARTERIAL INITIAL (MS) 12/19/2015 Greggory Keen, MD [facility]  . IR GENERIC HISTORICAL  12/19/2015   IR US GUIDE VASC ACCESS RIGHT 12/19/2015 Greggory Keen, MD [facility]  . IR GENERIC HISTORICAL  12/19/2015   IR ANGIOGRAM SELECTIVE EACH ADDITIONAL VESSEL 12/19/2015 Greggory Keen, MD [facility]  . IR GENERIC HISTORICAL  12/20/2015   IR THROMB F/U EVAL ART/VEN FINAL DAY (MS) 12/20/2015 Corrie Mckusick, DO [facility]  . IR GENERIC HISTORICAL  12/19/2015   IR INFUSION THROMBOL ARTERIAL INITIAL (MS) 12/19/2015 Greggory Keen, MD [facility]  . PLANTAR FASCIA RELEASE     right  . POLYPECTOMY

## 2019-09-21 ENCOUNTER — Ambulatory Visit: Payer: PRIVATE HEALTH INSURANCE | Admitting: Physical Therapy

## 2019-09-23 ENCOUNTER — Encounter: Payer: Self-pay | Admitting: Physical Therapy

## 2019-09-23 ENCOUNTER — Other Ambulatory Visit: Payer: Self-pay

## 2019-09-23 ENCOUNTER — Encounter: Payer: PRIVATE HEALTH INSURANCE | Admitting: Physical Therapy

## 2019-09-23 ENCOUNTER — Ambulatory Visit: Payer: PRIVATE HEALTH INSURANCE | Attending: Family Medicine | Admitting: Physical Therapy

## 2019-09-23 DIAGNOSIS — G8929 Other chronic pain: Secondary | ICD-10-CM | POA: Diagnosis present

## 2019-09-23 DIAGNOSIS — M25662 Stiffness of left knee, not elsewhere classified: Secondary | ICD-10-CM | POA: Diagnosis present

## 2019-09-23 DIAGNOSIS — M25552 Pain in left hip: Secondary | ICD-10-CM | POA: Insufficient documentation

## 2019-09-23 DIAGNOSIS — R2689 Other abnormalities of gait and mobility: Secondary | ICD-10-CM | POA: Insufficient documentation

## 2019-09-23 DIAGNOSIS — M25562 Pain in left knee: Secondary | ICD-10-CM | POA: Diagnosis present

## 2019-09-23 NOTE — Therapy (Signed)
[facility] [address] [place], [address] Phone: [phone]   Fax:  [phone]  Physical Therapy Treatment  Patient Details  Name: [name] MRN: [id number] Date of Birth: June 05, 1950 Referring Provider (PT): Eunice Blase, MD Zollie Beckers?)   Encounter Date: 09/23/2019  PT End of Session - 09/23/19 1008    Visit Number  2    Number of Visits  16    Date for PT Re-Evaluation  11/11/19    Authorization Type  [employer]    Authorization Time Period  09/16/19-11/11/19    PT Start Time  1000    PT Stop Time  1040    PT Time Calculation (min)  40 min    Activity Tolerance  Patient limited by pain    Behavior During Therapy  [facility] for tasks assessed/performed       Past Medical History:  Diagnosis Date  . Abnormal thyroid function test   . Allergy   . Anemia    she attributes previously to fibroids, she declines  . Barrett's esophagus   . Blood transfusion without reported diagnosis   . DDD (degenerative disc disease), cervical   . DJD (degenerative joint disease)   . GERD (gastroesophageal reflux disease)   . Heart murmur   . History of hiatal hernia   . Hx of adenomatous polyp of colon 03/01/2002  . OA (osteoarthritis)   . Obese   . Paroxysmal atrial fibrillation (HCC)   . PE (pulmonary embolism) 12/21/2015  . Plantar fasciitis   . PONV (postoperative nausea and vomiting)    pt. reports that she woke up during surgery  . Pre-diabetes     Past Surgical History:  Procedure Laterality Date  . ABDOMINAL HYSTERECTOMY    . Arthroscopic surgery left knee    . BACK SURGERY     2005 ,  2017  . BIOPSY  07/26/2019   Procedure: BIOPSY;  Surgeon: Gatha Mayer, MD;  [facility];  Service: Endoscopy;;  . CESAREAN SECTION    . CHOLECYSTECTOMY    . COLONOSCOPY  2003, 2011   3 mm adenoma 2011  . COLONOSCOPY WITH PROPOFOL N/A 07/26/2019   Procedure: COLONOSCOPY WITH PROPOFOL;  Surgeon:  Gatha Mayer, MD;  [facility];  Service: Endoscopy;  Laterality: N/A;  . ESOPHAGOGASTRODUODENOSCOPY  multiple since 2003  . ESOPHAGOGASTRODUODENOSCOPY (EGD) WITH PROPOFOL N/A 07/26/2019   Procedure: ESOPHAGOGASTRODUODENOSCOPY (EGD) WITH PROPOFOL;  Surgeon: Gatha Mayer, MD;  [facility];  Service: Endoscopy;  Laterality: N/A;  . IR GENERIC HISTORICAL  12/19/2015   IR ANGIOGRAM SELECTIVE EACH ADDITIONAL VESSEL 12/19/2015 Greggory Keen, MD [facility]  . IR GENERIC HISTORICAL  12/19/2015   IR ANGIOGRAM PULMONARY BILATERAL SELECTIVE 12/19/2015 Greggory Keen, MD [facility]  . IR GENERIC HISTORICAL  12/19/2015   IR INFUSION THROMBOL ARTERIAL INITIAL (MS) 12/19/2015 Greggory Keen, MD [facility]  . IR GENERIC HISTORICAL  12/19/2015   IR US GUIDE VASC ACCESS RIGHT 12/19/2015 Greggory Keen, MD [facility]  . IR GENERIC HISTORICAL  12/19/2015   IR ANGIOGRAM SELECTIVE EACH ADDITIONAL VESSEL 12/19/2015 Greggory Keen, MD [facility]  . IR GENERIC HISTORICAL  12/20/2015   IR THROMB F/U EVAL ART/VEN FINAL DAY (MS) 12/20/2015 Corrie Mckusick, DO [facility]  . IR GENERIC HISTORICAL  12/19/2015   IR INFUSION THROMBOL ARTERIAL INITIAL (MS) 12/19/2015 Greggory Keen, MD [facility]  . PLANTAR FASCIA RELEASE     right  . POLYPECTOMY  and impairments:  Decreased balance, Abnormal gait, Decreased mobility,  Decreased activity tolerance, Decreased range of motion, Difficulty walking, Obesity  Visit Diagnosis: Pain in left hip  Other abnormalities of gait and mobility  Stiffness of left knee, not elsewhere classified  Chronic pain of left knee     Problem List Patient Active Problem List   Diagnosis Date Noted  . Diarrhea   . B12 deficiency 01/08/2018  . Vitamin D deficiency 01/08/2018  . History of pulmonary embolism 01/08/2018  . Status post total left knee replacement 10/13/2017  . Family history of thyroid disease 08/25/2017  . Chronic pain of left knee 05/20/2017  . Unilateral primary osteoarthritis, left knee 05/20/2017  . Morbid obesity ([place]) 04/30/2017  . Family history of ovarian cancer 02/17/2017  . IBS (irritable bowel syndrome) 01/21/2017  . Paroxysmal atrial fibrillation ([place]) 06/18/2016  . Type 2 diabetes mellitus without complication, without long-term current use of insulin ([place]) 01/21/2016  . H/O Spinal surgery 12/21/2015  . Barrett's esophagus 12/21/2015  . Hx of adenomatous polyp of colon 03/01/2002   Shelton Silvas PT, DPT Shelton Silvas 09/23/2019, 10:38 AM  [place] [facility] [address], [place], [address] Phone: [phone]   Fax:  [phone]  Name: [name] MRN: [id number] Date of Birth: 1950/07/28  and impairments:  Decreased balance, Abnormal gait, Decreased mobility,  Decreased activity tolerance, Decreased range of motion, Difficulty walking, Obesity  Visit Diagnosis: Pain in left hip  Other abnormalities of gait and mobility  Stiffness of left knee, not elsewhere classified  Chronic pain of left knee     Problem List Patient Active Problem List   Diagnosis Date Noted  . Diarrhea   . B12 deficiency 01/08/2018  . Vitamin D deficiency 01/08/2018  . History of pulmonary embolism 01/08/2018  . Status post total left knee replacement 10/13/2017  . Family history of thyroid disease 08/25/2017  . Chronic pain of left knee 05/20/2017  . Unilateral primary osteoarthritis, left knee 05/20/2017  . Morbid obesity ([place]) 04/30/2017  . Family history of ovarian cancer 02/17/2017  . IBS (irritable bowel syndrome) 01/21/2017  . Paroxysmal atrial fibrillation ([place]) 06/18/2016  . Type 2 diabetes mellitus without complication, without long-term current use of insulin ([place]) 01/21/2016  . H/O Spinal surgery 12/21/2015  . Barrett's esophagus 12/21/2015  . Hx of adenomatous polyp of colon 03/01/2002   Shelton Silvas PT, DPT Shelton Silvas 09/23/2019, 10:38 AM  [place] [facility] [facility] [address], [place], [address] Phone: [phone]   Fax:  [phone]  Name: [name] MRN: [id number] Date of Birth: 1950/07/28  and impairments:  Decreased balance, Abnormal gait, Decreased mobility,  Decreased activity tolerance, Decreased range of motion, Difficulty walking, Obesity  Visit Diagnosis: Pain in left hip  Other abnormalities of gait and mobility  Stiffness of left knee, not elsewhere classified  Chronic pain of left knee     Problem List Patient Active Problem List   Diagnosis Date Noted  . Diarrhea   . B12 deficiency 01/08/2018  . Vitamin D deficiency 01/08/2018  . History of pulmonary embolism 01/08/2018  . Status post total left knee replacement 10/13/2017  . Family history of thyroid disease 08/25/2017  . Chronic pain of left knee 05/20/2017  . Unilateral primary osteoarthritis, left knee 05/20/2017  . Morbid obesity ([place]) 04/30/2017  . Family history of ovarian cancer 02/17/2017  . IBS (irritable bowel syndrome) 01/21/2017  . Paroxysmal atrial fibrillation ([place]) 06/18/2016  . Type 2 diabetes mellitus without complication, without long-term current use of insulin ([place]) 01/21/2016  . H/O Spinal surgery 12/21/2015  . Barrett's esophagus 12/21/2015  . Hx of adenomatous polyp of colon 03/01/2002   Shelton Silvas PT, DPT Shelton Silvas 09/23/2019, 10:38 AM  [place] [facility] [address], [place], [address] Phone: [phone]   Fax:  [phone]  Name: [name] MRN: [id number] Date of Birth: 1950/07/28

## 2019-09-30 ENCOUNTER — Ambulatory Visit: Payer: PRIVATE HEALTH INSURANCE | Admitting: Physical Therapy

## 2019-09-30 ENCOUNTER — Other Ambulatory Visit: Payer: Self-pay

## 2019-09-30 ENCOUNTER — Encounter: Payer: Self-pay | Admitting: Physical Therapy

## 2019-09-30 DIAGNOSIS — G8929 Other chronic pain: Secondary | ICD-10-CM

## 2019-09-30 DIAGNOSIS — M25562 Pain in left knee: Secondary | ICD-10-CM

## 2019-09-30 DIAGNOSIS — M25552 Pain in left hip: Secondary | ICD-10-CM | POA: Diagnosis not present

## 2019-09-30 DIAGNOSIS — M25662 Stiffness of left knee, not elsewhere classified: Secondary | ICD-10-CM

## 2019-09-30 DIAGNOSIS — R2689 Other abnormalities of gait and mobility: Secondary | ICD-10-CM

## 2019-09-30 NOTE — Therapy (Signed)
pulmonary embolism 01/08/2018  . Status post total  left knee replacement 10/13/2017  . Family history of thyroid disease 08/25/2017  . Chronic pain of left knee 05/20/2017  . Unilateral primary osteoarthritis, left knee 05/20/2017  . Morbid obesity ([place]) 04/30/2017  . Family history of ovarian cancer 02/17/2017  . IBS (irritable bowel syndrome) 01/21/2017  . Paroxysmal atrial fibrillation ([place]) 06/18/2016  . Type 2 diabetes mellitus without complication, without long-term current use of insulin ([place]) 01/21/2016  . H/O Spinal surgery 12/21/2015  . Barrett's esophagus 12/21/2015  . Hx of adenomatous polyp of colon 03/01/2002   Durwin Reges DPT  Durwin Reges 09/30/2019, 9:00 AM  [facility] [address], [place], [address] Phone: [phone]   Fax:  [phone]  Name: [name] MRN: [id number] Date of Birth: [birth year]  pulmonary embolism 01/08/2018  . Status post total  left knee replacement 10/13/2017  . Family history of thyroid disease 08/25/2017  . Chronic pain of left knee 05/20/2017  . Unilateral primary osteoarthritis, left knee 05/20/2017  . Morbid obesity ([place]) 04/30/2017  . Family history of ovarian cancer 02/17/2017  . IBS (irritable bowel syndrome) 01/21/2017  . Paroxysmal atrial fibrillation ([place]) 06/18/2016  . Type 2 diabetes mellitus without complication, without long-term current use of insulin ([place]) 01/21/2016  . H/O Spinal surgery 12/21/2015  . Barrett's esophagus 12/21/2015  . Hx of adenomatous polyp of colon 03/01/2002   Durwin Reges DPT  Durwin Reges 09/30/2019, 9:00 AM  [facility] [address], [place], [address] Phone: [phone]   Fax:  [phone]  Name: [name] MRN: [id number] Date of Birth: [birth year]  [place] [facility] [address], [place], [address] Phone: [phone]   Fax:  [phone]  Physical Therapy Treatment  Patient Details  Name: PHYLLICIA DUDEK MRN: [id number] Date of Birth: [birth year] Referring Provider (PT): Eunice Blase, MD Zollie Beckers?)   Encounter Date: 09/30/2019   PT End of Session - 09/30/19 0815    Visit Number 3    Number of Visits 16    Date for PT Re-Evaluation 11/11/19    Authorization Type [employer]    Authorization Time Period 09/16/19-11/11/19    PT Start Time 0804    PT Stop Time 0843    PT Time Calculation (min) 39 min    Activity Tolerance Patient limited by pain    Behavior During Therapy [facility] for tasks assessed/performed           Past Medical History:  Diagnosis Date  . Abnormal thyroid function test   . Allergy   . Anemia    she attributes previously to fibroids, she declines  . Barrett's esophagus   . Blood transfusion without reported diagnosis   . DDD (degenerative disc disease), cervical   . DJD (degenerative joint disease)   . GERD (gastroesophageal reflux disease)   . Heart murmur   . History of hiatal hernia   . Hx of adenomatous polyp of colon 03/01/2002  . OA (osteoarthritis)   . Obese   . Paroxysmal atrial fibrillation (HCC)   . PE (pulmonary embolism) 12/21/2015  . Plantar fasciitis   . PONV (postoperative nausea and vomiting)    pt. reports that she woke up during surgery  . Pre-diabetes     Past Surgical History:  Procedure Laterality Date  . ABDOMINAL HYSTERECTOMY    . Arthroscopic surgery left knee    . BACK SURGERY     2005 ,  2017  . BIOPSY  07/26/2019   Procedure: BIOPSY;  Surgeon: Gatha Mayer, MD;  [facility]: [facility];  Service: Endoscopy;;  . CESAREAN SECTION    . CHOLECYSTECTOMY    . COLONOSCOPY  2003, 2011   3 mm adenoma 2011  . COLONOSCOPY WITH PROPOFOL N/A 07/26/2019   Procedure: COLONOSCOPY WITH PROPOFOL;  Surgeon:  Gatha Mayer, MD;  [facility];  Service: Endoscopy;  Laterality: N/A;  . ESOPHAGOGASTRODUODENOSCOPY  multiple since 2003  . ESOPHAGOGASTRODUODENOSCOPY (EGD) WITH PROPOFOL N/A 07/26/2019   Procedure: ESOPHAGOGASTRODUODENOSCOPY (EGD) WITH PROPOFOL;  Surgeon: Gatha Mayer, MD;  [facility];  Service: Endoscopy;  Laterality: N/A;  . IR GENERIC HISTORICAL  12/19/2015   IR ANGIOGRAM SELECTIVE EACH ADDITIONAL VESSEL 12/19/2015 Gregg[facility] Keen, MD [facility]  . IR GENERIC HISTORICAL  12/19/2015   IR ANGIOGRAM PULMONARY BILATERAL SELECTIVE 12/19/2015 Greggory Keen, MD [facility]  . IR GENERIC HISTORICAL  12/19/2015   IR INFUSION THROMBOL ARTERIAL INITIAL (MS) 12/19/2015 Greggory Keen, MD [facility]  . IR GENERIC HISTORICAL  12/19/2015   IR US GUIDE VASC ACCESS RIGHT 12/19/2015 Greggory Keen, MD [facility]  . IR GENERIC HISTORICAL  12/19/2015   IR ANGIOGRAM SELECTIVE EACH ADDITIONAL VESSEL 12/19/2015 Greggory Keen, MD [facility]  . IR GENERIC HISTORICAL  12/20/2015   IR THROMB F/U EVAL ART/VEN FINAL DAY (MS) 12/20/2015 Corrie Mckusick, DO [facility]  . IR GENERIC HISTORICAL  12/19/2015   IR INFUSION THROMBOL ARTERIAL INITIAL (MS) 12/19/2015 Greggory Keen, MD [facility]  . PLANTAR FASCIA RELEASE     right  . POLYPECTOMY  07/26/2019   Procedure:  pulmonary embolism 01/08/2018  . Status post total  left knee replacement 10/13/2017  . Family history of thyroid disease 08/25/2017  . Chronic pain of left knee 05/20/2017  . Unilateral primary osteoarthritis, left knee 05/20/2017  . Morbid obesity ([place]) 04/30/2017  . Family history of ovarian cancer 02/17/2017  . IBS (irritable bowel syndrome) 01/21/2017  . Paroxysmal atrial fibrillation ([place]) 06/18/2016  . Type 2 diabetes mellitus without complication, without long-term current use of insulin ([place]) 01/21/2016  . H/O Spinal surgery 12/21/2015  . Barrett's esophagus 12/21/2015  . Hx of adenomatous polyp of colon 03/01/2002   Durwin Reges DPT  Durwin Reges 09/30/2019, 9:00 AM  [facility] [address], [place], [address] Phone: [phone]   Fax:  [phone]  Name: [name] MRN: [id number] Date of Birth: [birth year]

## 2019-10-02 ENCOUNTER — Other Ambulatory Visit: Payer: Self-pay | Admitting: Family Medicine

## 2019-10-03 MED ORDER — DIPHENOXYLATE-ATROPINE 2.5-0.025 MG PO TABS: 1.0000 | ORAL_TABLET | Freq: Four times a day (QID) | ORAL | 0 refills | Status: DC | PRN

## 2019-10-03 NOTE — Telephone Encounter (Signed)
Please advise 

## 2019-10-07 ENCOUNTER — Ambulatory Visit: Payer: PRIVATE HEALTH INSURANCE | Admitting: Physical Therapy

## 2019-10-12 ENCOUNTER — Telehealth: Payer: Self-pay | Admitting: *Deleted

## 2019-10-12 NOTE — Telephone Encounter (Signed)
Received fax from [place] with [facility] stating no clinicals have been received yet on this pt for review to authorize MRI and to fax the clinicals to [phone] or will have to non cert for LMI.   Clinicals were faxed to attn [place].

## 2019-10-14 ENCOUNTER — Ambulatory Visit: Payer: PRIVATE HEALTH INSURANCE

## 2019-10-14 ENCOUNTER — Other Ambulatory Visit: Payer: Self-pay

## 2019-10-14 DIAGNOSIS — M25552 Pain in left hip: Secondary | ICD-10-CM

## 2019-10-14 DIAGNOSIS — R2689 Other abnormalities of gait and mobility: Secondary | ICD-10-CM

## 2019-10-14 DIAGNOSIS — M25662 Stiffness of left knee, not elsewhere classified: Secondary | ICD-10-CM

## 2019-10-14 DIAGNOSIS — G8929 Other chronic pain: Secondary | ICD-10-CM

## 2019-10-14 NOTE — Therapy (Signed)
[facility] [address] [place], [address] Phone: [phone]   Fax:  [phone]  Physical Therapy Treatment  Patient Details  Name: [name] MRN: [id number] Date of Birth: 08-Mar-1951 Referring Provider (PT): Eunice Blase, MD Zollie Beckers?)   Encounter Date: 10/14/2019   PT End of Session - 10/14/19 0925    Visit Number 4    Number of Visits 16    Date for PT Re-Evaluation 11/11/19    Authorization Type [employer]    Authorization Time Period 09/16/19-11/11/19    PT Start Time 0915    PT Stop Time 1000    PT Time Calculation (min) 45 min    Activity Tolerance Patient limited by pain    Behavior During Therapy [facility] for tasks assessed/performed           Past Medical History:  Diagnosis Date  . Abnormal thyroid function test   . Allergy   . Anemia    she attributes previously to fibroids, she declines  . Barrett's esophagus   . Blood transfusion without reported diagnosis   . DDD (degenerative disc disease), cervical   . DJD (degenerative joint disease)   . GERD (gastroesophageal reflux disease)   . Heart murmur   . History of hiatal hernia   . Hx of adenomatous polyp of colon 03/01/2002  . OA (osteoarthritis)   . Obese   . Paroxysmal atrial fibrillation (HCC)   . PE (pulmonary embolism) 12/21/2015  . Plantar fasciitis   . PONV (postoperative nausea and vomiting)    pt. reports that she woke up during surgery  . Pre-diabetes     Past Surgical History:  Procedure Laterality Date  . ABDOMINAL HYSTERECTOMY    . Arthroscopic surgery left knee    . BACK SURGERY     2005 ,  2017  . BIOPSY  07/26/2019   Procedure: BIOPSY;  Surgeon: Gatha Mayer, MD;  [facility]: [facility];  Service: Endoscopy;;  . CESAREAN SECTION    . CHOLECYSTECTOMY    . COLONOSCOPY  2003, 2011   3 mm adenoma 2011  . COLONOSCOPY WITH PROPOFOL N/A 07/26/2019   Procedure: COLONOSCOPY WITH PROPOFOL;  Surgeon:  Gatha Mayer, MD;  [facility];  Service: Endoscopy;  Laterality: N/A;  . ESOPHAGOGASTRODUODENOSCOPY  multiple since 2003  . ESOPHAGOGASTRODUODENOSCOPY (EGD) WITH PROPOFOL N/A 07/26/2019   Procedure: ESOPHAGOGASTRODUODENOSCOPY (EGD) WITH PROPOFOL;  Surgeon: Gatha Mayer, MD;  [facility];  Service: Endoscopy;  Laterality: N/A;  . IR GENERIC HISTORICAL  12/19/2015   IR ANGIOGRAM SELECTIVE EACH ADDITIONAL VESSEL 12/19/2015 [facility] Keen, MD [facility]  . IR GENERIC HISTORICAL  12/19/2015   IR ANGIOGRAM PULMONARY BILATERAL SELECTIVE 12/19/2015 Greggory Keen, MD [facility]  . IR GENERIC HISTORICAL  12/19/2015   IR INFUSION THROMBOL ARTERIAL INITIAL (MS) 12/19/2015 Greggory Keen, MD [facility]  . IR GENERIC HISTORICAL  12/19/2015   IR US GUIDE VASC ACCESS RIGHT 12/19/2015 Greggory Keen, MD [facility]  . IR GENERIC HISTORICAL  12/19/2015   IR ANGIOGRAM SELECTIVE EACH ADDITIONAL VESSEL 12/19/2015 Greggory Keen, MD [facility]  . IR GENERIC HISTORICAL  12/20/2015   IR THROMB F/U EVAL ART/VEN FINAL DAY (MS) 12/20/2015 Corrie Mckusick, DO [facility]  . IR GENERIC HISTORICAL  12/19/2015   IR INFUSION THROMBOL ARTERIAL INITIAL (MS) 12/19/2015 Greggory Keen, MD [facility]  . PLANTAR FASCIA RELEASE     right  . POLYPECTOMY  07/26/2019   Procedure:  [facility] [address] [place], [address] Phone: [phone]   Fax:  [phone]  Physical Therapy Treatment  Patient Details  Name: [name] MRN: [id number] Date of Birth: 08-Mar-1951 Referring Provider (PT): Eunice Blase, MD Zollie Beckers?)   Encounter Date: 10/14/2019   PT End of Session - 10/14/19 0925    Visit Number 4    Number of Visits 16    Date for PT Re-Evaluation 11/11/19    Authorization Type [employer]    Authorization Time Period 09/16/19-11/11/19    PT Start Time 0915    PT Stop Time 1000    PT Time Calculation (min) 45 min    Activity Tolerance Patient limited by pain    Behavior During Therapy [facility] for tasks assessed/performed           Past Medical History:  Diagnosis Date  . Abnormal thyroid function test   . Allergy   . Anemia    she attributes previously to fibroids, she declines  . Barrett's esophagus   . Blood transfusion without reported diagnosis   . DDD (degenerative disc disease), cervical   . DJD (degenerative joint disease)   . GERD (gastroesophageal reflux disease)   . Heart murmur   . History of hiatal hernia   . Hx of adenomatous polyp of colon 03/01/2002  . OA (osteoarthritis)   . Obese   . Paroxysmal atrial fibrillation (HCC)   . PE (pulmonary embolism) 12/21/2015  . Plantar fasciitis   . PONV (postoperative nausea and vomiting)    pt. reports that she woke up during surgery  . Pre-diabetes     Past Surgical History:  Procedure Laterality Date  . ABDOMINAL HYSTERECTOMY    . Arthroscopic surgery left knee    . BACK SURGERY     2005 ,  2017  . BIOPSY  07/26/2019   Procedure: BIOPSY;  Surgeon: Gatha Mayer, MD;  [facility];  Service: Endoscopy;;  . CESAREAN SECTION    . CHOLECYSTECTOMY    . COLONOSCOPY  2003, 2011   3 mm adenoma 2011  . COLONOSCOPY WITH PROPOFOL N/A 07/26/2019   Procedure: COLONOSCOPY WITH PROPOFOL;  Surgeon:  Gatha Mayer, MD;  [facility];  Service: Endoscopy;  Laterality: N/A;  . ESOPHAGOGASTRODUODENOSCOPY  multiple since 2003  . ESOPHAGOGASTRODUODENOSCOPY (EGD) WITH PROPOFOL N/A 07/26/2019   Procedure: ESOPHAGOGASTRODUODENOSCOPY (EGD) WITH PROPOFOL;  Surgeon: Gatha Mayer, MD;  [facility];  Service: Endoscopy;  Laterality: N/A;  . IR GENERIC HISTORICAL  12/19/2015   IR ANGIOGRAM SELECTIVE EACH ADDITIONAL VESSEL 12/19/2015 Gregg[facility] Keen, MD [facility]  . IR GENERIC HISTORICAL  12/19/2015   IR ANGIOGRAM PULMONARY BILATERAL SELECTIVE 12/19/2015 Greggory Keen, MD [facility]  . IR GENERIC HISTORICAL  12/19/2015   IR INFUSION THROMBOL ARTERIAL INITIAL (MS) 12/19/2015 Greggory Keen, MD [facility]  . IR GENERIC HISTORICAL  12/19/2015   IR US GUIDE VASC ACCESS RIGHT 12/19/2015 Greggory Keen, MD [facility]  . IR GENERIC HISTORICAL  12/19/2015   IR ANGIOGRAM SELECTIVE EACH ADDITIONAL VESSEL 12/19/2015 Greggory Keen, MD [facility]  . IR GENERIC HISTORICAL  12/20/2015   IR THROMB F/U EVAL ART/VEN FINAL DAY (MS) 12/20/2015 Corrie Mckusick, DO [facility]  . IR GENERIC HISTORICAL  12/19/2015   IR INFUSION THROMBOL ARTERIAL INITIAL (MS) 12/19/2015 Greggory Keen, MD [facility]  . PLANTAR FASCIA RELEASE     right  . POLYPECTOMY  07/26/2019   Procedure:  Status New      PT LONG TERM GOAL #3   Title Pt wil limprove functional tolerance to basic mobility, ADL, and IADL performance AEB FOTO score improvement to >60/100    Baseline FOTO in 40s at eval.    Time 8    Period Weeks    Status New    Target Date 11/11/19                 Plan - 10/14/19 0934    Clinical Impression Statement Decision made to end session early as patient is having severe pain increase this weak and tolerance to Nustep activity after 4 minutes causing more pain. Pt also mentions Rheumatology not being able to r/o a "hairline fracture" in the xray (although other 2 docs did not voice this concern). Pt now has MRI scheduled tomorrow. As presentation is worsening without explanation and response to therapy has been inconsistent, decision made to end session early and await MRI results. Pt is agreeable.           Patient will benefit from skilled therapeutic intervention in order to improve the following deficits and impairments:     Visit Diagnosis: Pain in left hip  Other abnormalities of gait and mobility  Stiffness of left knee, not elsewhere classified  Chronic pain of left knee     Problem List Patient Active Problem List   Diagnosis Date Noted  . Diarrhea   . B12 deficiency 01/08/2018  . Vitamin D deficiency 01/08/2018  . History of pulmonary embolism 01/08/2018  . Status post total left knee replacement 10/13/2017  . Family history of thyroid disease 08/25/2017  . Chronic pain of left knee 05/20/2017  . Unilateral primary osteoarthritis, left knee 05/20/2017  . Morbid obesity ([place]) 04/30/2017  . Family  history of ovarian cancer 02/17/2017  . IBS (irritable bowel syndrome) 01/21/2017  . Paroxysmal atrial fibrillation ([place]) 06/18/2016  . Type 2 diabetes mellitus without complication, without long-term current use of insulin ([place]) 01/21/2016  . H/O Spinal surgery 12/21/2015  . Barrett's esophagus 12/21/2015  . Hx of adenomatous polyp of colon 03/01/2002   9:49 AM, 10/14/19 [name], PT, DPT Physical Therapist - [place] [phone] (Office)    [name] 10/14/2019, 9:48 AM  [place] [facility] [address] [place], [address] Phone: [phone]   Fax:  [phone]  Name: [name] MRN: [id number] Date of Birth: Jul 17, 1950

## 2019-10-15 ENCOUNTER — Ambulatory Visit
Admission: RE | Admit: 2019-10-15 | Discharge: 2019-10-15 | Disposition: A | Discharge status: Home / Self Care | Payer: PRIVATE HEALTH INSURANCE | Source: Ambulatory Visit | Attending: Physician Assistant | Admitting: Physician Assistant

## 2019-10-15 DIAGNOSIS — M25552 Pain in left hip: Secondary | ICD-10-CM

## 2019-10-21 ENCOUNTER — Encounter: Payer: PRIVATE HEALTH INSURANCE | Admitting: Physical Therapy

## 2019-10-21 ENCOUNTER — Ambulatory Visit: Payer: PRIVATE HEALTH INSURANCE | Admitting: Physical Therapy

## 2019-10-28 ENCOUNTER — Ambulatory Visit: Payer: PRIVATE HEALTH INSURANCE | Admitting: Physical Therapy

## 2019-11-01 ENCOUNTER — Ambulatory Visit: Payer: PRIVATE HEALTH INSURANCE | Admitting: Orthopaedic Surgery

## 2019-11-01 ENCOUNTER — Other Ambulatory Visit: Payer: Self-pay

## 2019-11-01 ENCOUNTER — Encounter: Payer: Self-pay | Admitting: Orthopaedic Surgery

## 2019-11-01 DIAGNOSIS — M1612 Unilateral primary osteoarthritis, left hip: Secondary | ICD-10-CM | POA: Diagnosis not present

## 2019-11-01 DIAGNOSIS — M25552 Pain in left hip: Secondary | ICD-10-CM | POA: Diagnosis not present

## 2019-11-01 MED ORDER — HYDROCODONE-ACETAMINOPHEN 5-325 MG PO TABS: 1.0000 | ORAL_TABLET | Freq: Four times a day (QID) | ORAL | 0 refills | Status: DC | PRN

## 2019-11-01 NOTE — Progress Notes (Signed)
The patient comes into today to go over an MRI of her left hip.  She has had debilitating hip pain for many months now.  She has tried and failed conservative treatment including an intra-articular steroid injection in that left hip.  She is work on activity modification as well as try to lose weight.  She is someone who is obese.  She is a diabetic but under good control with a hemoglobin A1c below 7.  She does take Eliquis twice daily.  On examination of her left hip she has severe pain with internal and external rotation of the hip as well as compressing the hip joint.  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  There is edema in the femoral head and acetabulum of the weightbearing areas.  There is also particular osteophytes and joint space narrowing.  At this point we recommended hip replacement surgery given the severity of her pain and the MRI findings combined with the failure conservative treatment.  I explained in detail what the surgery involves.  I showed her hip model and described the interoperative and postoperative course as well as described the risk and benefits of surgery.  This is something that she is interested in having in the near future.  We will send in some hydrocodone for pain to use sparingly.  She will need to be out of work at least 6 to 12 weeks after surgery depending on her recovery.  All question concerns were answered and addressed.

## 2019-11-02 ENCOUNTER — Encounter: Payer: Self-pay | Admitting: Orthopaedic Surgery

## 2019-11-04 ENCOUNTER — Ambulatory Visit: Payer: PRIVATE HEALTH INSURANCE | Admitting: Physical Therapy

## 2019-11-07 ENCOUNTER — Encounter: Payer: Self-pay | Admitting: Orthopaedic Surgery

## 2019-11-11 ENCOUNTER — Encounter: Payer: PRIVATE HEALTH INSURANCE | Admitting: Physical Therapy

## 2019-11-18 ENCOUNTER — Encounter: Payer: PRIVATE HEALTH INSURANCE | Admitting: Physical Therapy

## 2019-11-23 ENCOUNTER — Telehealth: Payer: Self-pay | Admitting: Family Medicine

## 2019-11-23 NOTE — Telephone Encounter (Signed)
Per patients request, re-faxed [employer] forms signed 5/25 (scanned 6/1) [phone]

## 2019-11-25 ENCOUNTER — Encounter: Payer: PRIVATE HEALTH INSURANCE | Admitting: Physical Therapy

## 2019-12-02 ENCOUNTER — Other Ambulatory Visit: Payer: Self-pay

## 2019-12-02 ENCOUNTER — Encounter: Payer: PRIVATE HEALTH INSURANCE | Admitting: Physical Therapy

## 2019-12-07 ENCOUNTER — Telehealth: Payer: Self-pay | Admitting: Orthopaedic Surgery

## 2019-12-07 ENCOUNTER — Encounter: Payer: Self-pay | Admitting: Orthopaedic Surgery

## 2019-12-07 NOTE — Telephone Encounter (Signed)
[employer] forms received. Sent to [employer] 

## 2019-12-16 ENCOUNTER — Other Ambulatory Visit (HOSPITAL_COMMUNITY): Payer: PRIVATE HEALTH INSURANCE

## 2019-12-16 ENCOUNTER — Encounter (HOSPITAL_COMMUNITY): Admission: RE | Admit: 2019-12-16 | Payer: PRIVATE HEALTH INSURANCE | Source: Ambulatory Visit

## 2019-12-20 ENCOUNTER — Encounter (HOSPITAL_COMMUNITY): Admission: RE | Payer: Self-pay | Source: Home / Self Care

## 2019-12-20 ENCOUNTER — Ambulatory Visit (HOSPITAL_COMMUNITY)
Admission: RE | Admit: 2019-12-20 | Payer: PRIVATE HEALTH INSURANCE | Source: Home / Self Care | Admitting: Orthopaedic Surgery

## 2019-12-20 SURGERY — ARTHROPLASTY, HIP, TOTAL, ANTERIOR APPROACH
Anesthesia: Spinal | Site: Hip | Laterality: Left

## 2019-12-27 ENCOUNTER — Encounter: Payer: Self-pay | Admitting: Orthopaedic Surgery

## 2019-12-29 ENCOUNTER — Other Ambulatory Visit: Payer: Self-pay

## 2019-12-29 ENCOUNTER — Encounter: Payer: Self-pay | Admitting: Emergency Medicine

## 2019-12-29 ENCOUNTER — Ambulatory Visit: Payer: PRIVATE HEALTH INSURANCE | Admitting: Emergency Medicine

## 2019-12-29 DIAGNOSIS — R0600 Dyspnea, unspecified: Secondary | ICD-10-CM | POA: Diagnosis not present

## 2019-12-29 MED ORDER — APIXABAN 5 MG PO TABS
5.0000 mg | ORAL_TABLET | Freq: Two times a day (BID) | ORAL | 2 refills | Status: DC
Start: 2019-12-29 — End: 2020-04-09

## 2019-12-29 NOTE — Assessment & Plan Note (Signed)
Suspect this is multifactorial.  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  She has a history of PE and associated secondary pulmonary hypertension.  She is been on Eliquis up until about 10 days ago so my suspicion for recurrent acute or throbbing PE is low.  Some evidence for scattered groundglass versus air trapping on CT chest from 06/2019, need to rule out obstructive lung disease.  We will arrange for pulmonary function testing.  Suspect she may have OSA/OHS.  Would consider formal sleep study going forward.  We will perform walking oximetry today to ensure that she does not have occult desaturation.  Walking oximetry today on room air.  We will arrange for full pulmonary function testing You should restart your Eliquis 5mg  twice a day now. We can determine when to stop it depending on when your hip surgery gets scheduled.  Follow with Dr. Lamonte Sakai next available with full pulmonary function testing on the same day.

## 2019-12-29 NOTE — Addendum Note (Signed)
Addended by: Vanessa Barbara on: 12/29/2019 04:17 PM   Modules accepted: Orders

## 2019-12-29 NOTE — Progress Notes (Signed)
Subjective:    Patient ID: [name], female    DOB: 1951/02/15, 69 y.o.   MRN: [id number]  HPI 69 year old never smoker who carries a history of asthma that was made about 20+ years ago. She has used albuterol.  Had pulmonary embolism 11/2015 that was provoked in the setting of a revision back surgery.  This was a saddle PE complicated by cor pulmonale for which she underwent catheter directed thrombolysis.  She also has atrial fibrillation and remained on Eliquis until late August - it was held in prep for a possible hip surgery.  Other past medical history significant for osteoarthritis, allergic rhinitis, GERD with Barrett's esophagus.   She reports that since Spring 2021 she has had more dyspnea. Can happen at rest, supine or w exertion. Feels difficult to get a good breath in. She has begun to have some R subscapular pain over the last week. No cough or wheeze. Some sensation of food getting stuck in swallowing. She feels better in the [facility], esp in her car. More difficult in the car. She snores lightly per her husband. No witnessed apneas. No consistent relief from albuterol.   A repeat CT-PA chest done 06/27/2019 reviewed by me, showed no evidence of recurrent PE, small to moderate hiatal hernia, some mild interstitial thickening and patchy areas of hazy opacity, possible air trapping Lower extremity Doppler ultrasound 08/26/2019 was negative for DVT bilaterally  Review of Systems As per HPI  Past Medical History:  Diagnosis Date  . Abnormal thyroid function test   . Allergy   . Anemia    she attributes previously to fibroids, she declines  . Barrett's esophagus   . Blood transfusion without reported diagnosis   . DDD (degenerative disc disease), cervical   . DJD (degenerative joint disease)   . GERD (gastroesophageal reflux disease)   . Heart murmur   . History of hiatal hernia   . Hx of adenomatous polyp of colon 03/01/2002  . OA (osteoarthritis)   . Obese   . Paroxysmal  atrial fibrillation (HCC)   . PE (pulmonary embolism) 12/21/2015  . Plantar fasciitis   . PONV (postoperative nausea and vomiting)    pt. reports that she woke up during surgery  . Pre-diabetes   . Pulmonary embolism (HCC)      Family History  Problem Relation Age of Onset  . Stroke Maternal Grandmother   . Bladder Cancer Maternal Grandmother 50  . Esophageal cancer Maternal Grandmother 50  . Hyperthyroidism Mother        Radioactive Iodine Treatment  . Hypothyroidism Mother   . Cervical cancer Maternal Aunt 50  . Ovarian cancer Daughter 17  . Colon cancer Neg Hx   . Pancreatic cancer Neg Hx   . Rectal cancer Neg Hx   . Stomach cancer Neg Hx   . Diabetes Neg Hx      Social History   Socioeconomic History  . Marital status: Married    Spouse name: Not on file  . Number of children: 3  . Years of education: Not on file  . Highest education level: Not on file  Occupational History    Comment: Telephone sales  Tobacco Use  . Smoking status: Never Smoker  . Smokeless tobacco: Never Used  Vaping Use  . Vaping Use: Never used  Substance and Sexual Activity  . Alcohol use: No  . Drug use: No  . Sexual activity: Not Currently    Partners: Male    Birth control/protection:  Surgical    Comment: Hysterectomy  Other Topics Concern  . Not on file  Social History Narrative   Married.   3 children, 2 grandchildren.   Work's in a call center.   Enjoys reading, sewing, spending time with her grandchildren.   Social Determinants of Health   Financial Resource Strain:   . Difficulty of Paying Living Expenses: Not on file  Food Insecurity:   . Worried About Programme researcher, broadcasting/film/video in the Last Year: Not on file  . Ran Out of Food in the Last Year: Not on file  Transportation Needs:   . Lack of Transportation (Medical): Not on file  . Lack of Transportation (Non-Medical): Not on file  Physical Activity:   . Days of Exercise per Week: Not on file  . Minutes of Exercise per  Session: Not on file  Stress:   . Feeling of Stress : Not on file  Social Connections:   . Frequency of Communication with Friends and Family: Not on file  . Frequency of Social Gatherings with Friends and Family: Not on file  . Attends Religious Services: Not on file  . Active Member of Clubs or Organizations: Not on file  . Attends Banker Meetings: Not on file  . Marital Status: Not on file  Intimate Partner Violence:   . Fear of Current or Ex-Partner: Not on file  . Emotionally Abused: Not on file  . Physically Abused: Not on file  . Sexually Abused: Not on file     Allergies  Allergen Reactions  . Ciprofloxacin Anaphylaxis  . Doxycycline Anaphylaxis  . Strawberry Extract Anaphylaxis  . Tetracyclines & Related Anaphylaxis     Outpatient Medications Prior to Visit  Medication Sig Dispense Refill  . apixaban (ELIQUIS) 5 MG TABS tablet Take 5 mg by mouth at bedtime.     . baclofen (LIORESAL) 10 MG tablet Take 0.5-1 tablets (5-10 mg total) by mouth 3 (three) times daily as needed for muscle spasms. 30 each 3  . [employer] LUBRICANT DROPS 1 % GEL Place 1 drop into both eyes 3 (three) times daily as needed (dry/irritated eyes.).   12  . diphenoxylate-atropine (LOMOTIL) 2.5-0.025 MG tablet Take 1-2 tablets by mouth 4 (four) times daily as needed for diarrhea or loose stools. 30 tablet 0  . HYDROcodone-acetaminophen (NORCO/VICODIN) 5-325 MG tablet Take 1 tablet by mouth every 6 (six) hours as needed for moderate pain or severe pain. 30 tablet 0  . Multiple Vitamin (MULTIVITAMIN WITH MINERALS) TABS tablet Take 1 tablet by mouth daily.    Marland Kitchen nystatin cream (MYCOSTATIN) Apply to affected area 2 times daily 15 g 1  . traMADol (ULTRAM) 50 MG tablet Take 1 tablet (50 mg total) by mouth every 6 (six) hours as needed. (Patient not taking: Reported on 12/13/2019) 30 tablet 0   No facility-administered medications prior to visit.        Objective:   Physical Exam Vitals:   12/29/19  1540  BP: 120/86  Pulse: 87  Temp: 98.2 F (36.8 C)  SpO2: 98%  Weight: 264 lb 12.8 oz (120.1 kg)  Height: 5\' 2"  (1.575 m)   Gen: Pleasant, morbidly obese, in no distress,  normal affect  ENT: No lesions,  mouth clear,  oropharynx clear, no postnasal drip  Neck: No JVD, no stridor  Lungs: No use of accessory muscles, no crackles or wheezing on normal respiration, no wheeze on forced expiration  Cardiovascular: RRR, heart sounds normal, no murmur or gallops,  no peripheral edema  Musculoskeletal: No deformities, no cyanosis or clubbing  Neuro: alert, awake, non focal  Skin: Warm, no lesions or rash       Assessment & Plan:  Dyspnea Suspect this is multifactorial.  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  She has a history of PE and associated secondary pulmonary hypertension.  She is been on Eliquis up until about 10 days ago so my suspicion for recurrent acute or throbbing PE is low.  Some evidence for scattered groundglass versus air trapping on CT chest from 06/2019, need to rule out obstructive lung disease.  We will arrange for pulmonary function testing.  Suspect she may have OSA/OHS.  Would consider formal sleep study going forward.  We will perform walking oximetry today to ensure that she does not have occult desaturation.  Walking oximetry today on room air.  We will arrange for full pulmonary function testing You should restart your Eliquis 5mg  twice a day now. We can determine when to stop it depending on when your hip surgery gets scheduled.  Follow with Dr. Delton Coombes next available with full pulmonary function testing on the same day.   Levy Pupa, MD, PhD 12/29/2019, 4:14 PM [facility] [phone] or if no answer [phone]

## 2019-12-29 NOTE — Patient Instructions (Addendum)
Walking oximetry today on room air.  We will arrange for full pulmonary function testing You should restart your Eliquis 5mg  twice a day now. We can determine when to stop it depending on when your hip surgery gets scheduled.  Follow with Dr. Lamonte Sakai next available with full pulmonary function testing on the same day.

## 2019-12-30 ENCOUNTER — Encounter: Payer: Self-pay | Admitting: Family Medicine

## 2019-12-30 MED ORDER — AZITHROMYCIN 250 MG PO TABS: ORAL_TABLET | ORAL | 0 refills | Status: DC

## 2020-01-03 ENCOUNTER — Inpatient Hospital Stay: Payer: PRIVATE HEALTH INSURANCE | Admitting: Orthopaedic Surgery

## 2020-01-13 ENCOUNTER — Ambulatory Visit: Payer: PRIVATE HEALTH INSURANCE | Admitting: Internal Medicine

## 2020-01-30 ENCOUNTER — Encounter: Payer: Self-pay | Admitting: Orthopaedic Surgery

## 2020-01-31 ENCOUNTER — Encounter: Payer: Self-pay | Admitting: Orthopaedic Surgery

## 2020-02-01 ENCOUNTER — Telehealth: Payer: Self-pay | Admitting: Orthopaedic Surgery

## 2020-02-01 NOTE — Telephone Encounter (Signed)
[employer] forms received. Sent to [employer] 

## 2020-02-02 ENCOUNTER — Other Ambulatory Visit: Payer: Self-pay

## 2020-02-02 ENCOUNTER — Ambulatory Visit: Payer: PRIVATE HEALTH INSURANCE | Admitting: Emergency Medicine

## 2020-02-02 NOTE — Progress Notes (Signed)
Please place surgical orders in epic for surgery scheduled on 02/17/2020. Patient has PAT appt scheduled for Monday 02/06/2020. Thanks.

## 2020-02-02 NOTE — Patient Instructions (Addendum)
DUE TO COVID-19 ONLY ONE VISITOR IS ALLOWED TO COME WITH YOU AND STAY IN THE WAITING ROOM ONLY DURING PRE OP AND PROCEDURE DAY OF SURGERY. THE 1 VISITOR  MAY VISIT WITH YOU AFTER SURGERY IN YOUR PRIVATE ROOM DURING VISITING HOURS ONLY!  YOU NEED TO HAVE A COVID 19 TEST ON Tuesday February 14, 2020 @ 10:50 am, THIS TEST MUST BE DONE BEFORE SURGERY,  COVID TESTING SITE [address] [place] [address], IT IS ON THE RIGHT GOING OUT [address] APPROXIMATELY  2 MINUTES PAST [employer] ON THE RIGHT. ONCE YOUR COVID TEST IS COMPLETED,  PLEASE BEGIN THE QUARANTINE INSTRUCTIONS AS OUTLINED IN YOUR HANDOUT.                [name]  02/02/2020   Your procedure is scheduled on: Friday February 17, 2020   Report to [facility] Main  Entrance   Report to admitting at [facility] AM     Call this number if you have problems the morning of surgery [phone]    Remember: Do not eat food after midnight. May have clear liquids from [place] till 0645 morning of procedure then nothing by mouth.  BRUSH YOUR TEETH MORNING OF SURGERY AND RINSE YOUR MOUTH OUT, NO CHEWING GUM CANDY OR MINTS.     Take these medicines the morning of surgery with A SIP OF WATER: NONE                                You may not have any metal on your body including hair pins and              piercings  Do not wear jewelry, make-up, lotions, powders or perfumes, deodorant             Do not wear nail polish on your fingernails.  Do not shave  48 hours prior to surgery.     Do not bring valuables to the hospital. [place].  Contacts, dentures or bridgework may not be worn into surgery.  Leave suitcase in the car. After surgery it may be brought to your room.     Patients discharged the day of surgery will not be allowed to drive home. IF YOU ARE HAVING SURGERY AND GOING HOME THE SAME DAY, YOU MUST HAVE AN ADULT TO DRIVE YOU HOME AND BE WITH  YOU FOR 24 HOURS. YOU MAY GO HOME BY TAXI OR UBER OR ORTHERWISE, BUT AN ADULT MUST ACCOMPANY YOU HOME AND STAY WITH YOU FOR 24 HOURS.  _____________________________________________________________________             [facility] - Preparing for Surgery Before surgery, you can play an important role.  Because skin is not sterile, your skin needs to be as free of germs as possible.  You can reduce the number of germs on your skin by washing with CHG (chlorahexidine gluconate) soap before surgery.  CHG is an antiseptic cleaner which kills germs and bonds with the skin to continue killing germs even after washing. Please DO NOT use if you have an allergy to CHG or antibacterial soaps.  If your skin becomes reddened/irritated stop using the CHG and inform your nurse when you arrive at Short Stay. Do not shave (including legs and underarms) for at least 48  hours prior to the first CHG shower.  You may shave your face/neck. Please follow these instructions carefully:  1.  Shower with CHG Soap the night before surgery and the  morning of Surgery.  2.  If you choose to wash your hair, wash your hair first as usual with your  normal  shampoo.  3.  After you shampoo, rinse your hair and body thoroughly to remove the  shampoo.                           4.  Use CHG as you would any other liquid soap.  You can apply chg directly  to the skin and wash                       Gently with a scrungie or clean washcloth.  5.  Apply the CHG Soap to your body ONLY FROM THE NECK DOWN.   Do not use on face/ open                           Wound or open sores. Avoid contact with eyes, ears mouth and genitals (private parts).                       Wash face,  Genitals (private parts) with your normal soap.             6.  Wash thoroughly, paying special attention to the area where your surgery  will be performed.  7.  Thoroughly rinse your body with warm water from the neck down.  8.  DO NOT shower/wash with your normal  soap after using and rinsing off  the CHG Soap.                9.  Pat yourself dry with a clean towel.            10.  Wear clean pajamas.            11.  Place clean sheets on your bed the night of your first shower and do not  sleep with pets. Day of Surgery : Do not apply any lotions/deodorants the morning of surgery.  Please wear clean clothes to the hospital/surgery center.  FAILURE TO FOLLOW THESE INSTRUCTIONS MAY RESULT IN THE CANCELLATION OF YOUR SURGERY PATIENT SIGNATURE_________________________________  NURSE SIGNATURE__________________________________  ________________________________________________________________________ [facility] - Preparing for Surgery Before surgery, you can play an important role.  Because skin is not sterile, your skin needs to be as free of germs as possible.  You can reduce the number of germs on your skin by washing with CHG (chlorahexidine gluconate) soap before surgery.  CHG is an antiseptic cleaner which kills germs and bonds with the skin to continue killing germs even after washing. Please DO NOT use if you have an allergy to CHG or antibacterial soaps.  If your skin becomes reddened/irritated stop using the CHG and inform your nurse when you arrive at Short Stay. Do not shave (including legs and underarms) for at least 48 hours prior to the first CHG shower.  You may shave your face/neck. Please follow these instructions carefully:  1.  Shower with CHG Soap the night before surgery and the  morning of Surgery.  2.  If you choose to wash your hair, wash your hair first as usual with your  normal  shampoo.  3.  After you shampoo, rinse your hair and body thoroughly to remove the  shampoo.                           4.  Use CHG as you would any other liquid soap.  You can apply chg directly  to the skin and wash                       Gently with a scrungie or clean washcloth.  5.  Apply the CHG Soap to your body ONLY FROM THE NECK DOWN.   Do not use  on face/ open                           Wound or open sores. Avoid contact with eyes, ears mouth and genitals (private parts).                       Wash face,  Genitals (private parts) with your normal soap.             6.  Wash thoroughly, paying special attention to the area where your surgery  will be performed.  7.  Thoroughly rinse your body with warm water from the neck down.  8.  DO NOT shower/wash with your normal soap after using and rinsing off  the CHG Soap.                9.  Pat yourself dry with a clean towel.            10.  Wear clean pajamas.            11.  Place clean sheets on your bed the night of your first shower and do not  sleep with pets. Day of Surgery : Do not apply any lotions/deodorants the morning of surgery.  Please wear clean clothes to the hospital/surgery center.  FAILURE TO FOLLOW THESE INSTRUCTIONS MAY RESULT IN THE CANCELLATION OF YOUR SURGERY PATIENT SIGNATURE_________________________________  NURSE SIGNATURE__________________________________  ________________________________________________________________________    CLEAR LIQUID DIET   Foods Allowed                                                                     Foods Excluded  Coffee and tea, regular and decaf                             liquids that you cannot  Plain Jell-O any favor except red or purple                                           see through such as: Fruit ices (not with fruit pulp)                                     milk, soups, orange juice  Iced Popsicles  All solid food Carbonated beverages, regular and diet                                    Cranberry, grape and apple juices Sports drinks like Gatorade Lightly seasoned clear broth or consume(fat free) Sugar, honey syrup  Sample Menu Breakfast                                Lunch                                     Supper Cranberry juice                    Beef broth                             Chicken broth Jell-O                                     Grape juice                           Apple juice Coffee or tea                        Jell-O                                      Popsicle                                                Coffee or tea                        Coffee or tea  _____________________________________________________________________

## 2020-02-06 ENCOUNTER — Other Ambulatory Visit: Payer: Self-pay

## 2020-02-06 ENCOUNTER — Encounter (HOSPITAL_COMMUNITY): Payer: Self-pay

## 2020-02-06 ENCOUNTER — Encounter (HOSPITAL_COMMUNITY)
Admission: RE | Admit: 2020-02-06 | Discharge: 2020-02-06 | Disposition: A | Discharge status: Home / Self Care | Payer: PRIVATE HEALTH INSURANCE | Source: Ambulatory Visit | Attending: Orthopaedic Surgery | Admitting: Orthopaedic Surgery

## 2020-02-06 DIAGNOSIS — Z7901 Long term (current) use of anticoagulants: Secondary | ICD-10-CM | POA: Insufficient documentation

## 2020-02-06 DIAGNOSIS — Z01812 Encounter for preprocedural laboratory examination: Secondary | ICD-10-CM | POA: Diagnosis present

## 2020-02-06 HISTORY — DX: Cardiac arrhythmia, unspecified: I49.9

## 2020-02-06 HISTORY — DX: Acute myocardial infarction, unspecified: I21.9

## 2020-02-06 HISTORY — DX: Pneumonia, unspecified organism: J18.9

## 2020-02-06 HISTORY — DX: Personal history of other diseases of the respiratory system: Z87.09

## 2020-02-06 LAB — CBC
HCT: 42.8 % (ref 36.0–46.0)
Hemoglobin: 14 g/dL (ref 12.0–15.0)
MCH: 28.3 pg (ref 26.0–34.0)
MCHC: 32.7 g/dL (ref 30.0–36.0)
MCV: 86.5 fL (ref 80.0–100.0)
Platelets: 233 10*3/uL (ref 150–400)
RBC: 4.95 MIL/uL (ref 3.87–5.11)
RDW: 13 % (ref 11.5–15.5)
WBC: 6.3 10*3/uL (ref 4.0–10.5)
nRBC: 0 % (ref 0.0–0.2)

## 2020-02-06 LAB — BASIC METABOLIC PANEL
Anion gap: 9 (ref 5–15)
BUN: 16 mg/dL (ref 8–23)
CO2: 26 mmol/L (ref 22–32)
Calcium: 9.4 mg/dL (ref 8.9–10.3)
Chloride: 107 mmol/L (ref 98–111)
Creatinine, Ser: 0.92 mg/dL (ref 0.44–1.00)
GFR, Estimated: >60 mL/min (ref >60)
Glucose, Bld: 106 mg/dL — ABNORMAL HIGH (ref 70–99)
Potassium: 4.1 mmol/L (ref 3.5–5.1)
Sodium: 142 mmol/L (ref 135–145)

## 2020-02-06 LAB — SURGICAL PCR SCREEN
MRSA, PCR: NEGATIVE
Staphylococcus aureus: NEGATIVE

## 2020-02-06 NOTE — Progress Notes (Signed)
EKG (epic) 06/27/2019 CXR (epic) 06/27/2019  Pt is on Eliquis managed by Dr Lamonte Sakai / pulmonary. Pt verbalized she will contact to see when she needs to stop prior to procedure.  Patient verbalized understanding of insttructions that were given to them at the PAT appointment. Patient was also instructed that they will need to review over the PAT instructions again at home before surgery.

## 2020-02-07 ENCOUNTER — Other Ambulatory Visit: Payer: Self-pay | Admitting: Physician Assistant

## 2020-02-07 LAB — TYPE AND SCREEN
ABO/RH(D): A NEG
Antibody Screen: NEGATIVE

## 2020-02-09 ENCOUNTER — Encounter: Payer: Self-pay | Admitting: Family Medicine

## 2020-02-09 NOTE — Progress Notes (Addendum)
Anesthesia Chart Review   Case: [id number] Date/Time: 02/17/20 0930   Procedure: LEFT TOTAL HIP ARTHROPLASTY ANTERIOR APPROACH (Left Hip)   Anesthesia type: Spinal   Pre-op diagnosis: Osteoarthritis Left Hip   Location: [facility]OM 09 / [facility]   Surgeons: Mcarthur Rossetti, MD     Addendum: - Pt saw pcp on 02/13/20 for evaluation of elevated BP. Dr. Junius Roads felt BP may be related to pain. Started pt on metoprolol; ok to proceed with surgery.   Willeen Cass, PhD, FNP-BC [facility]/Anesthesiology Phone: [phone] 02/13/2020 3:10 PM    DISCUSSION:69 y.o. never smoker with h/o PONV, GERD, asthma, PE, PAF (on Eliquis), left hip OA scheduled for above procedure 02/17/2020 with Dr. Jean Rosenthal.   Last seen by pulmonology 12/29/2019.  Per OV dyspnea multifactorial; restrictive disease due to obesity likely contributing.  Clearance received from Dr. Lamonte Sakai which states pt is cleared for surgery from pulmonary standpoint.  "Ok to stop Eliquis 4 days prior and can restart when surgeon deems appropriate."  Elevated BP at PAT visit.  Pt advised to follow up with PCP.  Pt has an appointment with PCP 02/13/2020.   VS: BP (S) (!) 189/111 Comment: sitting per right arm; rechecked per left arm sitting 148/100  Pulse 66   Temp 36.8 C (Oral)   Resp 16   Ht 5\' 2"  (1.575 m)   Wt 120.8 kg   SpO2 99%   BMI 48.73 kg/m   PROVIDERS: Hilts, Michael, MD is PCP   Baltazar Apo, MD is Pulmonologist  LABS: Labs reviewed: Acceptable for surgery. (all labs ordered are listed, but only abnormal results are displayed)  Labs Reviewed  BASIC METABOLIC PANEL - Abnormal; Notable for the following components:      Result Value   Glucose, Bld 106 (*)    All other components within normal limits  SURGICAL PCR SCREEN  CBC  TYPE AND SCREEN     IMAGES:   EKG: 06/27/2019 Rate 86 bpm  Sinus rhythm  Left axis deviation  Left anterior fascicular block  No acute st/t changes  compared to prior ecg   CV: Stress Test 09/03/2016  Nuclear stress EF: 67%.  There was no ST segment deviation noted during stress.  This is a low risk study.  The left ventricular ejection fraction is hyperdynamic (>65%).   1. EF 67%, normal wall motion.  2. Fixed small, mild apical lateral perfusion defect. No ischemia.  Given normal wall motion, this appears more likely to represent attenuation than prior MI.  Low risk study  Echo 07/15/2016 Study Conclusions   - Left ventricle: The cavity size was normal. Wall thickness was  normal. Systolic function was normal. The estimated ejection  fraction was in the range of 55% to 60%. Wall motion was normal;  there were no regional wall motion abnormalities. Doppler  parameters are consistent with abnormal left ventricular  relaxation (grade 1 diastolic dysfunction).  - Aortic valve: There was no stenosis.  - Mitral valve: There was no significant regurgitation.  - Right ventricle: The cavity size was mildly dilated. Systolic  function was normal.  - Tricuspid valve: Peak RV-RA gradient (S): 19 mm Hg.  - Pulmonary arteries: PA peak pressure: 22 mm Hg (S).  - Inferior vena cava: The vessel was normal in size. The  respirophasic diameter changes were in the normal range (>= 50%),  consistent with normal central venous pressure.   Impressions:   - Normal LV size with EF 55-60%. Mildly  dilated RV with normal  systolic function. No significant valvular abnormalities. Past Medical History:  Diagnosis Date  . Abnormal thyroid function test   . Allergy   . Anemia    she attributes previously to fibroids, she declines  . Barrett's esophagus   . Blood transfusion without reported diagnosis   . DDD (degenerative disc disease), cervical   . DJD (degenerative joint disease)   . Dysrhythmia   . GERD (gastroesophageal reflux disease)   . Heart murmur   . History of bronchitis   . History of hiatal hernia   . Hx of  adenomatous polyp of colon 03/01/2002  . Myocardial infarction ([place])   . OA (osteoarthritis)   . Obese   . Paroxysmal atrial fibrillation (HCC)   . PE (pulmonary embolism) 12/21/2015  . Plantar fasciitis   . Pneumonia   . PONV (postoperative nausea and vomiting)    pt. reports that she woke up during surgery  . Pre-diabetes   . Pulmonary embolism [facility])     Past Surgical History:  Procedure Laterality Date  . ABDOMINAL HYSTERECTOMY    . Arthroscopic surgery left knee    . BACK SURGERY     2005 ,  2017  . BIOPSY  07/26/2019   Procedure: BIOPSY;  Surgeon: Gatha Mayer, MD;  Location: [facility];  Service: Endoscopy;;  . CESAREAN SECTION    . CHOLECYSTECTOMY    . COLONOSCOPY  2003, 2011   3 mm adenoma 2011  . COLONOSCOPY WITH PROPOFOL N/A 07/26/2019   Procedure: COLONOSCOPY WITH PROPOFOL;  Surgeon: Gatha Mayer, MD;  Location: [facility];  Service: Endoscopy;  Laterality: N/A;  . ESOPHAGOGASTRODUODENOSCOPY  multiple since 2003  . ESOPHAGOGASTRODUODENOSCOPY (EGD) WITH PROPOFOL N/A 07/26/2019   Procedure: ESOPHAGOGASTRODUODENOSCOPY (EGD) WITH PROPOFOL;  Surgeon: Gatha Mayer, MD;  [facility];  Service: Endoscopy;  Laterality: N/A;  . IR GENERIC HISTORICAL  12/19/2015   IR ANGIOGRAM SELECTIVE EACH ADDITIONAL VESSEL 12/19/2015 Greggory Keen, MD [facility]  . IR GENERIC HISTORICAL  12/19/2015   IR ANGIOGRAM PULMONARY BILATERAL SELECTIVE 12/19/2015 Greggory Keen, MD [facility]  . IR GENERIC HISTORICAL  12/19/2015   IR INFUSION THROMBOL ARTERIAL INITIAL (MS) 12/19/2015 Greggory Keen, MD [facility]  . IR GENERIC HISTORICAL  12/19/2015   IR US GUIDE VASC ACCESS RIGHT 12/19/2015 Greggory Keen, MD [facility]  . IR GENERIC HISTORICAL  12/19/2015   IR ANGIOGRAM SELECTIVE EACH ADDITIONAL VESSEL 12/19/2015 Greggory Keen, MD [facility]  . IR GENERIC HISTORICAL  12/20/2015   IR THROMB F/U EVAL ART/VEN FINAL DAY (MS) 12/20/2015 Corrie Mckusick, DO [facility]  . IR GENERIC  HISTORICAL  12/19/2015   IR INFUSION THROMBOL ARTERIAL INITIAL (MS) 12/19/2015 Greggory Keen, MD [facility]  . PLANTAR FASCIA RELEASE     right  . POLYPECTOMY  07/26/2019   Procedure: POLYPECTOMY;  Surgeon: Gatha Mayer, MD;  [facility];  Service: Endoscopy;;  . Right knee replacement    . SPINAL FUSION  10/31/2015   revision at [facility] [place]    . TOTAL KNEE ARTHROPLASTY Left 10/13/2017   Procedure: LEFT TOTAL KNEE ARTHROPLASTY;  Surgeon: Mcarthur Rossetti, MD;  Location: [facility];  Service: Orthopedics;  Laterality: Left;  . ULNAR TUNNEL RELEASE     right  . VENOUS ABLATION     x 2    MEDICATIONS: . apixaban (ELIQUIS) 5 MG TABS tablet  . apixaban (ELIQUIS) 5 MG TABS tablet  . azithromycin (  ZITHROMAX) 250 MG tablet  . baclofen (LIORESAL) 10 MG tablet  . [employer] LUBRICANT DROPS 1 % GEL  . diphenoxylate-atropine (LOMOTIL) 2.5-0.025 MG tablet  . HYDROcodone-acetaminophen (NORCO/VICODIN) 5-325 MG tablet  . Multiple Vitamin (MULTIVITAMIN WITH MINERALS) TABS tablet  . nystatin cream (MYCOSTATIN)   No current facility-administered medications for this encounter.     Konrad Felix, PA-C [facility] [phone]

## 2020-02-13 ENCOUNTER — Ambulatory Visit: Payer: Self-pay

## 2020-02-13 ENCOUNTER — Ambulatory Visit (INDEPENDENT_AMBULATORY_CARE_PROVIDER_SITE_OTHER): Payer: PRIVATE HEALTH INSURANCE | Admitting: Family Medicine

## 2020-02-13 ENCOUNTER — Encounter: Payer: Self-pay | Admitting: Family Medicine

## 2020-02-13 ENCOUNTER — Other Ambulatory Visit: Payer: Self-pay

## 2020-02-13 VITALS — BP 164/111 | HR 93

## 2020-02-13 DIAGNOSIS — M25552 Pain in left hip: Secondary | ICD-10-CM | POA: Diagnosis not present

## 2020-02-13 DIAGNOSIS — I10 Essential (primary) hypertension: Secondary | ICD-10-CM | POA: Diagnosis not present

## 2020-02-13 MED ORDER — METOPROLOL TARTRATE 25 MG PO TABS: ORAL_TABLET | ORAL | 6 refills | Status: DC

## 2020-02-13 NOTE — Anesthesia Preprocedure Evaluation (Addendum)
Anesthesia Evaluation  Patient identified by MRN, date of birth, ID band Patient awake    Reviewed: Allergy & Precautions, NPO status , Patient's Chart, lab work & pertinent test results, reviewed documented beta blocker date and time   History of Anesthesia Complications (+) PONV, AWARENESS UNDER ANESTHESIA and history of anesthetic complications  Airway Mallampati: II  TM Distance: >3 FB Neck ROM: Full    Dental  (+) Dental Advisory Given   Pulmonary PE   Pulmonary exam normal        Cardiovascular + Past MI and + DVT  Normal cardiovascular exam+ dysrhythmias Atrial Fibrillation    '18 Myoperfusion - Nuclear stress EF: 67%. There was no ST segment deviation noted during stress. This is a low risk study.     Neuro/Psych negative neurological ROS  negative psych ROS   GI/Hepatic Neg liver ROS, hiatal hernia, GERD  Medicated and Controlled,  Endo/Other  Morbid obesity Pre-DM   Renal/GU negative Renal ROS     Musculoskeletal  (+) Arthritis ,  Multiple back surgeries    Abdominal   Peds  Hematology  On eliquis, last dose x 5 days    Anesthesia Other Findings Covid test negative   Reproductive/Obstetrics                            Anesthesia Physical Anesthesia Plan  ASA: III  Anesthesia Plan: General   Post-op Pain Management:    Induction: Intravenous  PONV Risk Score and Plan: 4 or greater and Treatment may vary due to age or medical condition, Ondansetron and Dexamethasone  Airway Management Planned: Oral ETT  Additional Equipment: None  Intra-op Plan:   Post-operative Plan: Extubation in OR  Informed Consent: I have reviewed the patients History and Physical, chart, labs and discussed the procedure including the risks, benefits and alternatives for the proposed anesthesia with the patient or authorized representative who has indicated his/her understanding and  acceptance.     Dental advisory given  Plan Discussed with: CRNA and Anesthesiologist  Anesthesia Plan Comments:        Anesthesia Quick Evaluation

## 2020-02-13 NOTE — Progress Notes (Signed)
Blood pressure was high at her preop exam last week. Having left [facility] 02/17/20 with Dr. Ninfa Linden.

## 2020-02-13 NOTE — Progress Notes (Signed)
Office Visit Note   Patient: [name]           Date of Birth: October 26, 1950           MRN: [id number] Visit Date: 02/13/2020 Requested by: Lavada Mesi, MD [address] [place],  [place] [address] PCP: Lavada Mesi, MD  Subjective: Chief Complaint  Patient presents with  . Blood Pressure Check    HPI: She is here with blood pressure concerns.  During her preoperative visit, her blood pressure was very high.  She has not had high blood pressure since she had preeclampsia with her daughter.  She does have a history of atrial fibrillation and was on metoprolol for that, but only for rate control.  She is asymptomatic from a blood pressure standpoint.              ROS:   All other systems were reviewed and are negative.  Objective: Vital Signs: There were no vitals taken for this visit.  Physical Exam:  General:  Alert and oriented, in no acute distress. Pulm:  Breathing unlabored. Psy:  Normal mood, congruent affect.  CV: Regular rate and rhythm without murmurs, rubs, or gallops.  2+ radial and posterior tibial pulses. Lungs: Clear to auscultation throughout with no wheezing or areas of consolidation.    Imaging: No results found.  Assessment & Plan: 1.  Elevated blood pressure, possibly related to pain -We will treat with metoprolol.  Proceed with surgery as scheduled, we will monitor her blood pressure and taper her off metoprolol when ready.     Procedures: No procedures performed  No notes on file     PMFS History: Patient Active Problem List   Diagnosis Date Noted  . Diarrhea   . B12 deficiency 01/08/2018  . Vitamin D deficiency 01/08/2018  . History of pulmonary embolism 01/08/2018  . Status post total left knee replacement 10/13/2017  . Family history of thyroid disease 08/25/2017  . Chronic pain of left knee 05/20/2017  . Unilateral primary osteoarthritis, left knee 05/20/2017  . Morbid obesity (HCC) 04/30/2017  . Family history of ovarian  cancer 02/17/2017  . IBS (irritable bowel syndrome) 01/21/2017  . Dyspnea 10/08/2016  . Paroxysmal atrial fibrillation (HCC) 06/18/2016  . Type 2 diabetes mellitus without complication, without long-term current use of insulin (HCC) 01/21/2016  . H/O Spinal surgery 12/21/2015  . Barrett's esophagus 12/21/2015  . Hx of adenomatous polyp of colon 03/01/2002   Past Medical History:  Diagnosis Date  . Abnormal thyroid function test   . Allergy   . Anemia    she attributes previously to fibroids, she declines  . Barrett's esophagus   . Blood transfusion without reported diagnosis   . DDD (degenerative disc disease), cervical   . DJD (degenerative joint disease)   . Dysrhythmia   . GERD (gastroesophageal reflux disease)   . Heart murmur   . History of bronchitis   . History of hiatal hernia   . Hx of adenomatous polyp of colon 03/01/2002  . Myocardial infarction (HCC)   . OA (osteoarthritis)   . Obese   . Paroxysmal atrial fibrillation (HCC)   . PE (pulmonary embolism) 12/21/2015  . Plantar fasciitis   . Pneumonia   . PONV (postoperative nausea and vomiting)    pt. reports that she woke up during surgery  . Pre-diabetes   . Pulmonary embolism (HCC)     Family History  Problem Relation Age of Onset  . Stroke Maternal Grandmother   .  Bladder Cancer Maternal Grandmother 50  . Esophageal cancer Maternal Grandmother 50  . Hyperthyroidism Mother        Radioactive Iodine Treatment  . Hypothyroidism Mother   . Cervical cancer Maternal Aunt 50  . Ovarian cancer Daughter 64  . Colon cancer Neg Hx   . Pancreatic cancer Neg Hx   . Rectal cancer Neg Hx   . Stomach cancer Neg Hx   . Diabetes Neg Hx     Past Surgical History:  Procedure Laterality Date  . ABDOMINAL HYSTERECTOMY    . Arthroscopic surgery left knee    . BACK SURGERY     2005 ,  2017  . BIOPSY  07/26/2019   Procedure: BIOPSY;  Surgeon: Iva Boop, MD;  Location[facility];  Service: Endoscopy;;  . CESAREAN  SECTION    . CHOLECYSTECTOMY    . COLONOSCOPY  2003, 2011   3 mm adenoma 2011  . COLONOSCOPY WITH PROPOFOL N/A 07/26/2019   Procedure: COLONOSCOPY WITH PROPOFOL;  Surgeon: Iva Boop, MD;  [facility];  Service: Endoscopy;  Laterality: N/A;  . ESOPHAGOGASTRODUODENOSCOPY  multiple since 2003  . ESOPHAGOGASTRODUODENOSCOPY (EGD) WITH PROPOFOL N/A 07/26/2019   Procedure: ESOPHAGOGASTRODUODENOSCOPY (EGD) WITH PROPOFOL;  Surgeon: Iva Boop, MD;  [facility];  Service: Endoscopy;  Laterality: N/A;  . IR GENERIC HISTORICAL  12/19/2015   IR ANGIOGRAM SELECTIVE EACH ADDITIONAL VESSEL 12/19/2015 [name], MD [facility]  . IR GENERIC HISTORICAL  12/19/2015   IR ANGIOGRAM PULMONARY BILATERAL SELECTIVE 12/19/2015 [name], MD [facility]  . IR GENERIC HISTORICAL  12/19/2015   IR INFUSION THROMBOL ARTERIAL INITIAL (MS) 12/19/2015 [name], MD [facility]  . IR GENERIC HISTORICAL  12/19/2015   IR US GUIDE VASC ACCESS RIGHT 12/19/2015 [name], MD [facility]  . IR GENERIC HISTORICAL  12/19/2015   IR ANGIOGRAM SELECTIVE EACH ADDITIONAL VESSEL 12/19/2015 [name], MD [facility]  . IR GENERIC HISTORICAL  12/20/2015   IR THROMB F/U EVAL ART/VEN FINAL DAY (MS) 12/20/2015 Gilmer Mor, DO [facility]  . IR GENERIC HISTORICAL  12/19/2015   IR INFUSION THROMBOL ARTERIAL INITIAL (MS) 12/19/2015 [name], MD [facility]  . PLANTAR FASCIA RELEASE     right  . POLYPECTOMY  07/26/2019   Procedure: POLYPECTOMY;  Surgeon: Iva Boop, MD;  Location: [facility];  Service: Endoscopy;;  . Right knee replacement    . SPINAL FUSION  10/31/2015   revision at [facility]   . TONSILLECTOMY    . TOTAL KNEE ARTHROPLASTY Left 10/13/2017   Procedure: LEFT TOTAL KNEE ARTHROPLASTY;  Surgeon: Kathryne Hitch, MD;  [facility];  Service: Orthopedics;  Laterality: Left;  . ULNAR TUNNEL RELEASE     right  . VENOUS ABLATION     x 2   Social  History   Occupational History    Comment: Telephone sales  Tobacco Use  . Smoking status: Never Smoker  . Smokeless tobacco: Never Used  Vaping Use  . Vaping Use: Never used  Substance and Sexual Activity  . Alcohol use: No  . Drug use: No  . Sexual activity: Not Currently    Partners: Male    Birth control/protection: Surgical    Comment: Hysterectomy

## 2020-02-14 ENCOUNTER — Other Ambulatory Visit (HOSPITAL_COMMUNITY)
Admission: RE | Admit: 2020-02-14 | Discharge: 2020-02-14 | Disposition: A | Discharge status: Home / Self Care | Payer: PRIVATE HEALTH INSURANCE | Source: Ambulatory Visit | Attending: Orthopaedic Surgery | Admitting: Orthopaedic Surgery

## 2020-02-14 ENCOUNTER — Encounter: Payer: Self-pay | Admitting: Orthopaedic Surgery

## 2020-02-14 DIAGNOSIS — Z20822 Contact with and (suspected) exposure to covid-19: Secondary | ICD-10-CM | POA: Insufficient documentation

## 2020-02-14 DIAGNOSIS — Z01812 Encounter for preprocedural laboratory examination: Secondary | ICD-10-CM | POA: Insufficient documentation

## 2020-02-14 LAB — SARS CORONAVIRUS 2 (TAT 6-24 HRS): SARS Coronavirus 2: NEGATIVE

## 2020-02-15 NOTE — Telephone Encounter (Signed)
RB - can we "clear" the pt for surgery based her last OV in September?

## 2020-02-16 ENCOUNTER — Encounter: Payer: Self-pay | Admitting: Orthopaedic Surgery

## 2020-02-16 DIAGNOSIS — M1612 Unilateral primary osteoarthritis, left hip: Secondary | ICD-10-CM

## 2020-02-16 MED ORDER — DEXTROSE 5 % IV SOLN
3.0000 g | Freq: Once | INTRAVENOUS | Status: AC
  Administered 2020-02-17: 3 g via INTRAVENOUS
  Filled 2020-02-16: qty 3

## 2020-02-16 NOTE — H&P (Signed)
TOTAL HIP ADMISSION H&P  Patient is admitted for left total hip arthroplasty.  Subjective:  Chief Complaint: left hip pain  HPI: [name], 69 y.o. female, has a history of pain and functional disability in the left hip(s) due to arthritis and patient has failed non-surgical conservative treatments for greater than 12 weeks to include NSAID's and/or analgesics, corticosteriod injections, flexibility and strengthening excercises, supervised PT with diminished ADL's post treatment, use of assistive devices, weight reduction as appropriate and activity modification.  Onset of symptoms was gradual starting 3 years ago with gradually worsening course since that time.The patient noted no past surgery on the left hip(s).  Patient currently rates pain in the left hip at 10 out of 10 with activity. Patient has night pain, worsening of pain with activity and weight bearing, trendelenberg gait, pain that interfers with activities of daily living and pain with passive range of motion. Patient has evidence of subchondral sclerosis, periarticular osteophytes and joint space narrowing by imaging studies. This condition presents safety issues increasing the risk of falls.  There is no current active infection.  Patient Active Problem List   Diagnosis Date Noted  . Unilateral primary osteoarthritis, left hip 02/16/2020  . Diarrhea   . B12 deficiency 01/08/2018  . Vitamin D deficiency 01/08/2018  . History of pulmonary embolism 01/08/2018  . Status post total left knee replacement 10/13/2017  . Family history of thyroid disease 08/25/2017  . Chronic pain of left knee 05/20/2017  . Unilateral primary osteoarthritis, left knee 05/20/2017  . Morbid obesity ([place]) 04/30/2017  . Family history of ovarian cancer 02/17/2017  . IBS (irritable bowel syndrome) 01/21/2017  . Dyspnea 10/08/2016  . Paroxysmal atrial fibrillation ([place]) 06/18/2016  . Type 2 diabetes mellitus without complication, without long-term  current use of insulin ([place]) 01/21/2016  . H/O Spinal surgery 12/21/2015  . Barrett's esophagus 12/21/2015  . Hx of adenomatous polyp of colon 03/01/2002   Past Medical History:  Diagnosis Date  . Abnormal thyroid function test   . Allergy   . Anemia    she attributes previously to fibroids, she declines  . Barrett's esophagus   . Blood transfusion without reported diagnosis   . DDD (degenerative disc disease), cervical   . DJD (degenerative joint disease)   . Dysrhythmia   . GERD (gastroesophageal reflux disease)   . Heart murmur   . History of bronchitis   . History of hiatal hernia   . Hx of adenomatous polyp of colon 03/01/2002  . Myocardial infarction ([place])   . OA (osteoarthritis)   . Obese   . Paroxysmal atrial fibrillation ([facility])   . PE (pulmonary embolism) 12/21/2015  . Plantar fasciitis   . Pneumonia   . PONV (postoperative nausea and vomiting)    pt. reports that she woke up during surgery  . Pre-diabetes   . Pulmonary embolism [facility])     Past Surgical History:  Procedure Laterality Date  . ABDOMINAL HYSTERECTOMY    . Arthroscopic surgery left knee    . BACK SURGERY     2005 ,  2017  . BIOPSY  07/26/2019   Procedure: BIOPSY;  Surgeon: Gatha Mayer, MD;  Location: [facility];  Service: Endoscopy;;  . CESAREAN SECTION    . CHOLECYSTECTOMY    . COLONOSCOPY  2003, 2011   3 mm adenoma 2011  . COLONOSCOPY WITH PROPOFOL N/A 07/26/2019   Procedure: COLONOSCOPY WITH PROPOFOL;  Surgeon: Gatha Mayer, MD;  Location: [facility];  Service: Endoscopy;  Laterality: N/A;  . ESOPHAGOGASTRODUODENOSCOPY  multiple since 2003  . ESOPHAGOGASTRODUODENOSCOPY (EGD) WITH PROPOFOL N/A 07/26/2019   Procedure: ESOPHAGOGASTRODUODENOSCOPY (EGD) WITH PROPOFOL;  Surgeon: Gatha Mayer, MD;  Location: [facility];  Service: Endoscopy;  Laterality: N/A;  . IR GENERIC HISTORICAL  12/19/2015   IR ANGIOGRAM SELECTIVE EACH ADDITIONAL VESSEL 12/19/2015 Greggory Keen, MD [facility]  . IR  GENERIC HISTORICAL  12/19/2015   IR ANGIOGRAM PULMONARY BILATERAL SELECTIVE 12/19/2015 Greggory Keen, MD [facility]  . IR GENERIC HISTORICAL  12/19/2015   IR INFUSION THROMBOL ARTERIAL INITIAL (MS) 12/19/2015 Greggory Keen, MD [facility]  . IR GENERIC HISTORICAL  12/19/2015   IR US GUIDE VASC ACCESS RIGHT 12/19/2015 Greggory Keen, MD [facility]  . IR GENERIC HISTORICAL  12/19/2015   IR ANGIOGRAM SELECTIVE EACH ADDITIONAL VESSEL 12/19/2015 Greggory Keen, MD [facility]  . IR GENERIC HISTORICAL  12/20/2015   IR THROMB F/U EVAL ART/VEN FINAL DAY (MS) 12/20/2015 Corrie Mckusick, DO [facility]  . IR GENERIC HISTORICAL  12/19/2015   IR INFUSION THROMBOL ARTERIAL INITIAL (MS) 12/19/2015 Greggory Keen, MD [facility]  . PLANTAR FASCIA RELEASE     right  . POLYPECTOMY  07/26/2019   Procedure: POLYPECTOMY;  Surgeon: Gatha Mayer, MD;  Location: [facility];  Service: Endoscopy;;  . Right knee replacement    . SPINAL FUSION  10/31/2015   revision at [place]    . TOTAL KNEE ARTHROPLASTY Left 10/13/2017   Procedure: LEFT TOTAL KNEE ARTHROPLASTY;  Surgeon: Mcarthur Rossetti, MD;  Location: [place];  Service: Orthopedics;  Laterality: Left;  . ULNAR TUNNEL RELEASE     right  . VENOUS ABLATION     x 2    Current Facility-Administered Medications  Medication Dose Route Frequency Provider Last Rate Last Admin  . [START ON 02/17/2020] ceFAZolin (ANCEF) 3 g in dextrose 5 % 50 mL IVPB  3 g Intravenous Once Mcarthur Rossetti, MD       Current Outpatient Medications  Medication Sig Dispense Refill Last Dose  . apixaban (ELIQUIS) 5 MG TABS tablet Take 1 tablet (5 mg total) by mouth 2 (two) times daily. 60 tablet 2   . apixaban (ELIQUIS) 5 MG TABS tablet Take 5 mg by mouth at bedtime.  (Patient not taking: Reported on 02/02/2020)   Not Taking at Unknown time  . baclofen (LIORESAL) 10 MG tablet Take 0.5-1 tablets (5-10 mg total) by mouth 3 (three) times daily as  needed for muscle spasms. (Patient not taking: Reported on 02/02/2020) 30 each 3 Not Taking at Unknown time  . [employer] LUBRICANT DROPS 1 % GEL Place 1 drop into both eyes 3 (three) times daily as needed (dry/irritated eyes.).  (Patient not taking: Reported on 02/02/2020)  12 Not Taking at Unknown time  . diphenoxylate-atropine (LOMOTIL) 2.5-0.025 MG tablet Take 1-2 tablets by mouth 4 (four) times daily as needed for diarrhea or loose stools. (Patient not taking: Reported on 02/02/2020) 30 tablet 0 Not Taking at Unknown time  . HYDROcodone-acetaminophen (NORCO/VICODIN) 5-325 MG tablet Take 1 tablet by mouth every 6 (six) hours as needed for moderate pain or severe pain. (Patient not taking: Reported on 02/02/2020) 30 tablet 0 Not Taking at Unknown time  . metoprolol tartrate (LOPRESSOR) 25 MG tablet 1 PO BID, may increase to 2 PO BID if needed. 60 tablet 6   . Multiple Vitamin (MULTIVITAMIN WITH MINERALS) TABS tablet Take 1 tablet by mouth daily. (Patient not taking: Reported on  02/02/2020)   Not Taking at Unknown time  . nystatin cream (MYCOSTATIN) Apply to affected area 2 times daily (Patient not taking: Reported on 02/02/2020) 15 g 1 Not Taking at Unknown time   Allergies  Allergen Reactions  . Ciprofloxacin Anaphylaxis  . Doxycycline Anaphylaxis  . Strawberry Extract Anaphylaxis  . Tetracyclines & Related Anaphylaxis    Social History   Tobacco Use  . Smoking status: Never Smoker  . Smokeless tobacco: Never Used  Substance Use Topics  . Alcohol use: No    Family History  Problem Relation Age of Onset  . Stroke Maternal Grandmother   . Bladder Cancer Maternal Grandmother 45  . Esophageal cancer Maternal Grandmother 77  . Hyperthyroidism Mother        Radioactive Iodine Treatment  . Hypothyroidism Mother   . Cervical cancer Maternal Aunt 50  . Ovarian cancer Daughter 6  . Colon cancer Neg Hx   . Pancreatic cancer Neg Hx   . Rectal cancer Neg Hx   . Stomach cancer Neg Hx   .  Diabetes Neg Hx      Review of Systems  Musculoskeletal: Positive for gait problem.  All other systems reviewed and are negative.   Objective:  Physical Exam Vitals reviewed.  Constitutional:      Appearance: Normal appearance.  HENT:     Head: Normocephalic and atraumatic.  Eyes:     Extraocular Movements: Extraocular movements intact.     Pupils: Pupils are equal, round, and reactive to light.  Cardiovascular:     Rate and Rhythm: Normal rate and regular rhythm.  Pulmonary:     Effort: Pulmonary effort is normal.     Breath sounds: Normal breath sounds.  Abdominal:     Palpations: Abdomen is soft.  Musculoskeletal:     Cervical back: Normal range of motion.     Left hip: Tenderness and bony tenderness present. Decreased range of motion. Decreased strength.  Neurological:     Mental Status: She is alert and oriented to person, place, and time.  Psychiatric:        Behavior: Behavior normal.     Vital signs in last 24 hours:    Labs:   Estimated body mass index is 48.73 kg/m as calculated from the following:   Height as of 02/06/20: 5\' 2"  (1.575 m).   Weight as of 02/06/20: 120.8 kg.   Imaging Review Plain radiographs demonstrate severe degenerative joint disease of the left hip(s). The bone quality appears to be good for age and reported activity level.      Assessment/Plan:  End stage arthritis, left hip(s)  The patient history, physical examination, clinical judgement of the provider and imaging studies are consistent with end stage degenerative joint disease of the left hip(s) and total hip arthroplasty is deemed medically necessary. The treatment options including medical management, injection therapy, arthroscopy and arthroplasty were discussed at length. The risks and benefits of total hip arthroplasty were presented and reviewed. The risks due to aseptic loosening, infection, stiffness, dislocation/subluxation,  thromboembolic complications and other  imponderables were discussed.  The patient acknowledged the explanation, agreed to proceed with the plan and consent was signed. Patient is being admitted for inpatient treatment for surgery, pain control, PT, OT, prophylactic antibiotics, VTE prophylaxis, progressive ambulation and ADL's and discharge planning.The patient is planning to be discharged home with home health services

## 2020-02-17 ENCOUNTER — Encounter (HOSPITAL_COMMUNITY)
Admission: AD | Disposition: A | Discharge status: Home / Self Care | Payer: Self-pay | Source: Home / Self Care | Attending: Orthopaedic Surgery

## 2020-02-17 ENCOUNTER — Ambulatory Visit (HOSPITAL_COMMUNITY): Payer: No Typology Code available for payment source | Admitting: Certified Registered Nurse Anesthetist

## 2020-02-17 ENCOUNTER — Ambulatory Visit (HOSPITAL_COMMUNITY): Payer: No Typology Code available for payment source | Admitting: Physician Assistant

## 2020-02-17 ENCOUNTER — Other Ambulatory Visit: Payer: Self-pay

## 2020-02-17 ENCOUNTER — Ambulatory Visit (HOSPITAL_COMMUNITY): Payer: No Typology Code available for payment source

## 2020-02-17 ENCOUNTER — Observation Stay (HOSPITAL_COMMUNITY): Payer: No Typology Code available for payment source

## 2020-02-17 ENCOUNTER — Encounter (HOSPITAL_COMMUNITY): Payer: Self-pay | Admitting: Orthopaedic Surgery

## 2020-02-17 ENCOUNTER — Inpatient Hospital Stay (HOSPITAL_COMMUNITY)
Admission: AD | Admit: 2020-02-17 | Discharge: 2020-02-21 | DRG: 470 | Disposition: A | Discharge status: Home / Self Care | Payer: No Typology Code available for payment source | Attending: Orthopaedic Surgery | Admitting: Orthopaedic Surgery

## 2020-02-17 DIAGNOSIS — Z888 Allergy status to other drugs, medicaments and biological substances status: Secondary | ICD-10-CM

## 2020-02-17 DIAGNOSIS — E559 Vitamin D deficiency, unspecified: Secondary | ICD-10-CM | POA: Diagnosis present

## 2020-02-17 DIAGNOSIS — M1612 Unilateral primary osteoarthritis, left hip: Principal | ICD-10-CM

## 2020-02-17 DIAGNOSIS — Z86711 Personal history of pulmonary embolism: Secondary | ICD-10-CM

## 2020-02-17 DIAGNOSIS — Z96652 Presence of left artificial knee joint: Secondary | ICD-10-CM | POA: Diagnosis present

## 2020-02-17 DIAGNOSIS — Z981 Arthrodesis status: Secondary | ICD-10-CM

## 2020-02-17 DIAGNOSIS — Z86010 Personal history of colonic polyps: Secondary | ICD-10-CM

## 2020-02-17 DIAGNOSIS — Z419 Encounter for procedure for purposes other than remedying health state, unspecified: Secondary | ICD-10-CM

## 2020-02-17 DIAGNOSIS — Z823 Family history of stroke: Secondary | ICD-10-CM

## 2020-02-17 DIAGNOSIS — E538 Deficiency of other specified B group vitamins: Secondary | ICD-10-CM | POA: Diagnosis present

## 2020-02-17 DIAGNOSIS — K219 Gastro-esophageal reflux disease without esophagitis: Secondary | ICD-10-CM | POA: Diagnosis present

## 2020-02-17 DIAGNOSIS — Z96642 Presence of left artificial hip joint: Secondary | ICD-10-CM

## 2020-02-17 DIAGNOSIS — Z8349 Family history of other endocrine, nutritional and metabolic diseases: Secondary | ICD-10-CM

## 2020-02-17 DIAGNOSIS — E119 Type 2 diabetes mellitus without complications: Secondary | ICD-10-CM | POA: Diagnosis present

## 2020-02-17 DIAGNOSIS — Z6841 Body Mass Index (BMI) 40.0 and over, adult: Secondary | ICD-10-CM

## 2020-02-17 DIAGNOSIS — Z79899 Other long term (current) drug therapy: Secondary | ICD-10-CM

## 2020-02-17 HISTORY — PX: TOTAL HIP ARTHROPLASTY: SHX124

## 2020-02-17 LAB — ABO/RH: ABO/RH(D): A NEG

## 2020-02-17 SURGERY — ARTHROPLASTY, HIP, TOTAL, ANTERIOR APPROACH
Anesthesia: General | Site: Hip | Laterality: Left

## 2020-02-17 MED ORDER — CEFAZOLIN SODIUM-DEXTROSE 2-4 GM/100ML-% IV SOLN
2.0000 g | Freq: Four times a day (QID) | INTRAVENOUS | Status: AC
  Administered 2020-02-17 (×2): 2 g via INTRAVENOUS
  Filled 2020-02-17 (×2): qty 100

## 2020-02-17 MED ORDER — PHENOL 1.4 % MT LIQD: 1.0000 | OROMUCOSAL | Status: DC | PRN

## 2020-02-17 MED ORDER — HYDROMORPHONE HCL 2 MG/ML IJ SOLN
INTRAMUSCULAR | Status: AC
  Filled 2020-02-17: qty 1

## 2020-02-17 MED ORDER — DOCUSATE SODIUM 100 MG PO CAPS
100.0000 mg | ORAL_CAPSULE | Freq: Two times a day (BID) | ORAL | Status: DC
  Administered 2020-02-17 – 2020-02-21 (×7): 100 mg via ORAL
  Filled 2020-02-17 (×8): qty 1

## 2020-02-17 MED ORDER — POLYETHYLENE GLYCOL 3350 17 G PO PACK: 17.0000 g | PACK | Freq: Every day | ORAL | Status: DC | PRN

## 2020-02-17 MED ORDER — OXYCODONE HCL 5 MG/5ML PO SOLN: 5.0000 mg | Freq: Once | ORAL | Status: DC | PRN

## 2020-02-17 MED ORDER — APIXABAN 5 MG PO TABS
5.0000 mg | ORAL_TABLET | Freq: Two times a day (BID) | ORAL | Status: DC
  Administered 2020-02-18 – 2020-02-21 (×7): 5 mg via ORAL
  Filled 2020-02-17 (×7): qty 1

## 2020-02-17 MED ORDER — PROPOFOL 10 MG/ML IV BOLUS
INTRAVENOUS | Status: AC
  Filled 2020-02-17: qty 20

## 2020-02-17 MED ORDER — OXYCODONE HCL 5 MG PO TABS
5.0000 mg | ORAL_TABLET | ORAL | Status: DC | PRN
  Administered 2020-02-20 – 2020-02-21 (×3): 10 mg via ORAL
  Filled 2020-02-17 (×8): qty 2

## 2020-02-17 MED ORDER — FENTANYL CITRATE (PF) 100 MCG/2ML IJ SOLN
25.0000 ug | INTRAMUSCULAR | Status: DC | PRN
  Administered 2020-02-17: 25 ug via INTRAVENOUS
  Administered 2020-02-17: 50 ug via INTRAVENOUS

## 2020-02-17 MED ORDER — ROCURONIUM BROMIDE 10 MG/ML (PF) SYRINGE
PREFILLED_SYRINGE | INTRAVENOUS | Status: DC | PRN
  Administered 2020-02-17: 60 mg via INTRAVENOUS

## 2020-02-17 MED ORDER — DIPHENHYDRAMINE HCL 12.5 MG/5ML PO ELIX: 12.5000 mg | ORAL_SOLUTION | ORAL | Status: DC | PRN

## 2020-02-17 MED ORDER — 0.9 % SODIUM CHLORIDE (POUR BTL) OPTIME
TOPICAL | Status: DC | PRN
  Administered 2020-02-17: 1000 mL

## 2020-02-17 MED ORDER — ALUM & MAG HYDROXIDE-SIMETH 200-200-20 MG/5ML PO SUSP: 30.0000 mL | ORAL | Status: DC | PRN

## 2020-02-17 MED ORDER — METHOCARBAMOL 500 MG PO TABS
500.0000 mg | ORAL_TABLET | Freq: Four times a day (QID) | ORAL | Status: DC | PRN
  Administered 2020-02-17 – 2020-02-19 (×5): 500 mg via ORAL
  Filled 2020-02-17 (×6): qty 1

## 2020-02-17 MED ORDER — FENTANYL CITRATE (PF) 100 MCG/2ML IJ SOLN
INTRAMUSCULAR | Status: DC | PRN
  Administered 2020-02-17: 100 ug via INTRAVENOUS

## 2020-02-17 MED ORDER — FENTANYL CITRATE (PF) 100 MCG/2ML IJ SOLN
INTRAMUSCULAR | Status: AC
  Filled 2020-02-17: qty 2

## 2020-02-17 MED ORDER — FENTANYL CITRATE (PF) 100 MCG/2ML IJ SOLN
INTRAMUSCULAR | Status: AC
Start: 2020-02-17 — End: 2020-02-18
  Filled 2020-02-17: qty 2

## 2020-02-17 MED ORDER — ACETAMINOPHEN 325 MG PO TABS
325.0000 mg | ORAL_TABLET | Freq: Four times a day (QID) | ORAL | Status: DC | PRN
  Administered 2020-02-18 – 2020-02-19 (×3): 650 mg via ORAL
  Filled 2020-02-17 (×3): qty 2

## 2020-02-17 MED ORDER — EPHEDRINE SULFATE-NACL 50-0.9 MG/10ML-% IV SOSY
PREFILLED_SYRINGE | INTRAVENOUS | Status: DC | PRN
  Administered 2020-02-17: 10 mg via INTRAVENOUS

## 2020-02-17 MED ORDER — PROPOFOL 10 MG/ML IV BOLUS
INTRAVENOUS | Status: DC | PRN
  Administered 2020-02-17: 150 mg via INTRAVENOUS

## 2020-02-17 MED ORDER — POVIDONE-IODINE 10 % EX SWAB
2.0000 "application " | Freq: Once | CUTANEOUS | Status: AC
  Administered 2020-02-17: 2 via TOPICAL

## 2020-02-17 MED ORDER — METHOCARBAMOL 500 MG IVPB - SIMPLE MED
500.0000 mg | Freq: Four times a day (QID) | INTRAVENOUS | Status: DC | PRN
  Filled 2020-02-17: qty 50

## 2020-02-17 MED ORDER — CHLORHEXIDINE GLUCONATE 0.12 % MT SOLN
15.0000 mL | Freq: Once | OROMUCOSAL | Status: AC
  Administered 2020-02-17: 15 mL via OROMUCOSAL

## 2020-02-17 MED ORDER — ONDANSETRON HCL 4 MG/2ML IJ SOLN: 4.0000 mg | Freq: Once | INTRAMUSCULAR | Status: DC | PRN

## 2020-02-17 MED ORDER — SUGAMMADEX SODIUM 200 MG/2ML IV SOLN
INTRAVENOUS | Status: DC | PRN
  Administered 2020-02-17: 200 mg via INTRAVENOUS

## 2020-02-17 MED ORDER — OXYCODONE HCL 5 MG PO TABS
10.0000 mg | ORAL_TABLET | ORAL | Status: DC | PRN
  Administered 2020-02-17: 10 mg via ORAL
  Administered 2020-02-17: 15 mg via ORAL
  Administered 2020-02-18 (×2): 10 mg via ORAL
  Administered 2020-02-18 (×2): 15 mg via ORAL
  Administered 2020-02-19 – 2020-02-20 (×3): 10 mg via ORAL
  Filled 2020-02-17: qty 3
  Filled 2020-02-17: qty 2
  Filled 2020-02-17: qty 3
  Filled 2020-02-17: qty 2
  Filled 2020-02-17: qty 3

## 2020-02-17 MED ORDER — MENTHOL 3 MG MT LOZG: 1.0000 | LOZENGE | OROMUCOSAL | Status: DC | PRN

## 2020-02-17 MED ORDER — PANTOPRAZOLE SODIUM 40 MG PO TBEC
40.0000 mg | DELAYED_RELEASE_TABLET | Freq: Every day | ORAL | Status: DC
  Administered 2020-02-17 – 2020-02-21 (×5): 40 mg via ORAL
  Filled 2020-02-17 (×5): qty 1

## 2020-02-17 MED ORDER — LACTATED RINGERS IV SOLN: INTRAVENOUS | Status: DC

## 2020-02-17 MED ORDER — PNEUMOCOCCAL VAC POLYVALENT 25 MCG/0.5ML IJ INJ
0.5000 mL | INJECTION | INTRAMUSCULAR | Status: DC | PRN
  Filled 2020-02-17 (×2): qty 0.5

## 2020-02-17 MED ORDER — MIDAZOLAM HCL 2 MG/2ML IJ SOLN
INTRAMUSCULAR | Status: AC
  Filled 2020-02-17: qty 2

## 2020-02-17 MED ORDER — OXYCODONE HCL 5 MG PO TABS: 5.0000 mg | ORAL_TABLET | Freq: Once | ORAL | Status: DC | PRN

## 2020-02-17 MED ORDER — ONDANSETRON HCL 4 MG PO TABS: 4.0000 mg | ORAL_TABLET | Freq: Four times a day (QID) | ORAL | Status: DC | PRN

## 2020-02-17 MED ORDER — ONDANSETRON HCL 4 MG/2ML IJ SOLN
4.0000 mg | Freq: Four times a day (QID) | INTRAMUSCULAR | Status: DC | PRN
  Administered 2020-02-17 – 2020-02-18 (×2): 4 mg via INTRAVENOUS
  Filled 2020-02-17 (×2): qty 2

## 2020-02-17 MED ORDER — HYDROMORPHONE HCL 1 MG/ML IJ SOLN
0.5000 mg | INTRAMUSCULAR | Status: DC | PRN
  Administered 2020-02-17 (×2): 1 mg via INTRAVENOUS
  Administered 2020-02-18: 0.5 mg via INTRAVENOUS
  Filled 2020-02-17 (×4): qty 1

## 2020-02-17 MED ORDER — LIDOCAINE 2% (20 MG/ML) 5 ML SYRINGE
INTRAMUSCULAR | Status: DC | PRN
  Administered 2020-02-17: 60 mg via INTRAVENOUS

## 2020-02-17 MED ORDER — SODIUM CHLORIDE 0.9 % IV SOLN: INTRAVENOUS | Status: DC

## 2020-02-17 MED ORDER — METOPROLOL TARTRATE 25 MG PO TABS
25.0000 mg | ORAL_TABLET | Freq: Two times a day (BID) | ORAL | Status: DC
  Administered 2020-02-17 – 2020-02-21 (×9): 25 mg via ORAL
  Filled 2020-02-17 (×9): qty 1

## 2020-02-17 MED ORDER — METOCLOPRAMIDE HCL 5 MG/ML IJ SOLN
5.0000 mg | Freq: Three times a day (TID) | INTRAMUSCULAR | Status: DC | PRN
  Administered 2020-02-17: 10 mg via INTRAVENOUS
  Filled 2020-02-17: qty 2

## 2020-02-17 MED ORDER — ORAL CARE MOUTH RINSE: 15.0000 mL | Freq: Once | OROMUCOSAL | Status: AC

## 2020-02-17 MED ORDER — METOCLOPRAMIDE HCL 5 MG PO TABS: 5.0000 mg | ORAL_TABLET | Freq: Three times a day (TID) | ORAL | Status: DC | PRN

## 2020-02-17 MED ORDER — ONDANSETRON HCL 4 MG/2ML IJ SOLN
INTRAMUSCULAR | Status: DC | PRN
  Administered 2020-02-17: 4 mg via INTRAVENOUS

## 2020-02-17 MED ORDER — INFLUENZA VAC A&B SA ADJ QUAD 0.5 ML IM PRSY
0.5000 mL | PREFILLED_SYRINGE | INTRAMUSCULAR | Status: DC | PRN
  Filled 2020-02-17: qty 0.5

## 2020-02-17 MED ORDER — DEXAMETHASONE SODIUM PHOSPHATE 10 MG/ML IJ SOLN
INTRAMUSCULAR | Status: DC | PRN
  Administered 2020-02-17: 10 mg via INTRAVENOUS

## 2020-02-17 MED ORDER — TRANEXAMIC ACID-NACL 1000-0.7 MG/100ML-% IV SOLN
1000.0000 mg | INTRAVENOUS | Status: AC
  Administered 2020-02-17: 1000 mg via INTRAVENOUS
  Filled 2020-02-17: qty 100

## 2020-02-17 MED ORDER — STERILE WATER FOR IRRIGATION IR SOLN
Status: DC | PRN
  Administered 2020-02-17: 2000 mL

## 2020-02-17 MED ORDER — MIDAZOLAM HCL 5 MG/5ML IJ SOLN
INTRAMUSCULAR | Status: DC | PRN
  Administered 2020-02-17: 2 mg via INTRAVENOUS

## 2020-02-17 MED ORDER — HYDROMORPHONE HCL 1 MG/ML IJ SOLN
INTRAMUSCULAR | Status: DC | PRN
Start: 2020-02-17 — End: 2020-02-17
  Administered 2020-02-17 (×4): .5 mg via INTRAVENOUS

## 2020-02-17 MED ORDER — SODIUM CHLORIDE 0.9 % IR SOLN
Status: DC | PRN
  Administered 2020-02-17: 1000 mL

## 2020-02-17 SURGICAL SUPPLY — 43 items
APL SKNCLS STERI-STRIP NONHPOA (GAUZE/BANDAGES/DRESSINGS)
BAG SPEC THK2 15X12 ZIP CLS (MISCELLANEOUS)
BAG ZIPLOCK 12X15 (MISCELLANEOUS) IMPLANT
BENZOIN TINCTURE PRP APPL 2/3 (GAUZE/BANDAGES/DRESSINGS) IMPLANT
BLADE SAW SGTL 18X1.27X75 (BLADE) ×2 IMPLANT
COVER PERINEAL POST (MISCELLANEOUS) ×2 IMPLANT
COVER SURGICAL LIGHT HANDLE (MISCELLANEOUS) ×2 IMPLANT
COVER WAND RF STERILE (DRAPES) ×2 IMPLANT
CUP SECTOR GRIPTON 50MM (Cup) ×2 IMPLANT
DRAPE STERI IOBAN 125X83 (DRAPES) ×2 IMPLANT
DRAPE U-SHAPE 47X51 STRL (DRAPES) ×4 IMPLANT
DRSG AQUACEL AG ADV 3.5X10 (GAUZE/BANDAGES/DRESSINGS) ×2 IMPLANT
DURAPREP 26ML APPLICATOR (WOUND CARE) ×2 IMPLANT
ELECT REM PT RETURN 15FT ADLT (MISCELLANEOUS) ×2 IMPLANT
GAUZE XEROFORM 1X8 LF (GAUZE/BANDAGES/DRESSINGS) ×2 IMPLANT
GLOVE BIO SURGEON STRL SZ7.5 (GLOVE) ×2 IMPLANT
GLOVE BIOGEL PI IND STRL 8 (GLOVE) ×2 IMPLANT
GLOVE BIOGEL PI INDICATOR 8 (GLOVE) ×2
GLOVE ECLIPSE 8.0 STRL XLNG CF (GLOVE) ×2 IMPLANT
GOWN STRL REUS W/TWL XL LVL3 (GOWN DISPOSABLE) ×4 IMPLANT
HANDPIECE INTERPULSE COAX TIP (DISPOSABLE) ×2
HEAD FEM STD 32X+1 STRL (Hips) ×2 IMPLANT
HOLDER FOLEY CATH W/STRAP (MISCELLANEOUS) ×2 IMPLANT
KIT TURNOVER KIT A (KITS) IMPLANT
LINER ACET PNNCL PLUS4 NEUTRAL (Hips) ×1 IMPLANT
PACK ANTERIOR HIP CUSTOM (KITS) ×2 IMPLANT
PENCIL SMOKE EVACUATOR (MISCELLANEOUS) ×2 IMPLANT
PINNACLE PLUS 4 NEUTRAL (Hips) ×2 IMPLANT
SET HNDPC FAN SPRY TIP SCT (DISPOSABLE) ×1 IMPLANT
SPONGE SURGIFOAM ABS GEL 12-7 (HEMOSTASIS) ×2 IMPLANT
STAPLER VISISTAT 35W (STAPLE) IMPLANT
STEM CORAIL KLA11 (Stem) ×2 IMPLANT
STRIP CLOSURE SKIN 1/2X4 (GAUZE/BANDAGES/DRESSINGS) IMPLANT
SUT ETHIBOND NAB CT1 #1 30IN (SUTURE) ×2 IMPLANT
SUT ETHILON 2 0 PS N (SUTURE) IMPLANT
SUT MNCRL AB 4-0 PS2 18 (SUTURE) IMPLANT
SUT VIC AB 0 CT1 36 (SUTURE) ×2 IMPLANT
SUT VIC AB 1 CT1 36 (SUTURE) ×2 IMPLANT
SUT VIC AB 2-0 CT1 27 (SUTURE) ×4
SUT VIC AB 2-0 CT1 TAPERPNT 27 (SUTURE) ×2 IMPLANT
SYR BULB IRRIG 60ML STRL (SYRINGE) ×2 IMPLANT
TRAY FOLEY MTR SLVR 14FR STAT (SET/KITS/TRAYS/PACK) ×2 IMPLANT
TRAY FOLEY MTR SLVR 16FR STAT (SET/KITS/TRAYS/PACK) IMPLANT

## 2020-02-17 NOTE — Progress Notes (Signed)
Orthopedic Tech Progress Note Patient Details:  [name] 1950/12/27 [id number]  Ortho Devices Ortho Device/Splint Location: applied overhead frame to bed Ortho Device/Splint Interventions: Ordered, Application       Braulio Bosch 02/17/2020, 2:00 PM

## 2020-02-17 NOTE — Evaluation (Signed)
Physical Therapy Evaluation Patient Details Name: [name] MRN: [id number] DOB: [birth year] Today's Date: 02/17/2020   History of Present Illness  s/p L DA THA. PMH: L TKA, obesity, heart murmur, PE, anemia, spine surgery  Clinical Impression  Pt is s/p THA resulting in the deficits listed below (see PT Problem List). Pt amb ~ 20' with RW and min assist. Fatigues easily. Household ambulator prior to surgery. Pt reports she is planning for SNF post acute. Will continue to follow in acute setting and assess for post acute needs.   Pt will benefit from skilled PT to increase their independence and safety with mobility to allow discharge to the venue listed below.      Follow Up Recommendations Follow surgeon's recommendation for DC plan and follow-up therapies (pt reports she desires SNF)    Equipment Recommendations  None recommended by PT    Recommendations for Other Services       Precautions / Restrictions Precautions Precautions: Fall Restrictions Weight Bearing Restrictions: No LLE Weight Bearing: Weight bearing as tolerated Other Position/Activity Restrictions: WBAT      Mobility  Bed Mobility Overal bed mobility: Needs Assistance Bed Mobility: Rolling;Sidelying to Sit Rolling: Min assist Sidelying to sit: Min assist       General bed mobility comments: assist to roll, use of rail, assist to elevate trunk    Transfers Overall transfer level: Needs assistance Equipment used: Rolling walker (2 wheeled) Transfers: Sit to/from Stand Sit to Stand: Min guard         General transfer comment: cues for hand placement and safety  Ambulation/Gait Ambulation/Gait assistance: Min assist Gait Distance (Feet): 22 Feet Assistive device: Rolling walker (2 wheeled) Gait Pattern/deviations: Step-to pattern;Decreased stance time - left;Wide base of support     General Gait Details: cues for sequence, RW position, limited by fatigue  Stairs             Wheelchair Mobility    Modified Rankin (Stroke Patients Only)       Balance Overall balance assessment: Needs assistance         Standing balance support: Bilateral upper extremity supported Standing balance-Leahy Scale: Poor Standing balance comment: reliant on UEs                             Pertinent Vitals/Pain Pain Assessment: 0-10 Pain Score: 6  Pain Location: left hip Pain Descriptors / Indicators: Grimacing;Sore Pain Intervention(s): Limited activity within patient's tolerance;Monitored during session;Premedicated before session;Repositioned;Ice applied    Home Living Family/patient expects to be discharged to:: Skilled nursing facility Living Arrangements: Spouse/significant other   Type of Home: House Home Access: Stairs to enter   [employer] of Steps: 5 and 1 Home Layout: One level Home Equipment: Cane - single point;Walker - 2 wheels Additional Comments: pt reports she needs to go to rehab, her husband cannot take care of her and she doesn't want HHPT bc her 90yo mother lives with them (she does not want her "exposed")    Prior Function           Comments: furniture walking at home prior to surgery     Hand Dominance        Extremity/Trunk Assessment   Upper Extremity Assessment Upper Extremity Assessment: Overall WFL for tasks assessed (pt reported L UE injury recently (triceps))    Lower Extremity Assessment Lower Extremity Assessment: LLE deficits/detail LLE Deficits / Details: ankle WFL, knee and hip 2+/5 grossly  limited by post op pain and weakness       Communication   Communication: No difficulties  Cognition Arousal/Alertness: Awake/alert Behavior During Therapy: WFL for tasks assessed/performed;Flat affect Overall Cognitive Status: Within Functional Limits for tasks assessed                                        General Comments      Exercises Total Joint Exercises Ankle  Circles/Pumps: AROM;15 reps;Both   Assessment/Plan    PT Assessment Patient needs continued PT services  PT Problem List Decreased strength;Decreased activity tolerance;Decreased mobility;Pain;Obesity       PT Treatment Interventions DME instruction;Therapeutic activities;Gait training;Functional mobility training;Therapeutic exercise;Patient/family education;Stair training    PT Goals (Current goals can be found in the Care Plan section)  Acute Rehab PT Goals Patient Stated Goal: home after rehab PT Goal Formulation: With patient/family Time For Goal Achievement: 02/24/20 Potential to Achieve Goals: Good    Frequency 7X/week   Barriers to discharge        Co-evaluation               AM-PAC PT "6 Clicks" Mobility  Outcome Measure Help needed turning from your back to your side while in a flat bed without using bedrails?: A Little Help needed moving from lying on your back to sitting on the side of a flat bed without using bedrails?: A Little Help needed moving to and from a bed to a chair (including a wheelchair)?: A Little Help needed standing up from a chair using your arms (e.g., wheelchair or bedside chair)?: A Little Help needed to walk in hospital room?: A Little Help needed climbing 3-5 steps with a railing? : A Lot 6 Click Score: 17    End of Session Equipment Utilized During Treatment: Gait belt Activity Tolerance: Patient tolerated treatment well Patient left: in chair;with call bell/phone within reach;with chair alarm set;with family/visitor present Nurse Communication: Mobility status PT Visit Diagnosis: Difficulty in walking, not elsewhere classified (R26.2)    Time: 2595-6387 PT Time Calculation (min) (ACUTE ONLY): 23 min   Charges:   PT Evaluation $PT Eval Low Complexity: 1 Low PT Treatments $Gait Training: 8-22 mins        Delice Bison, PT  Acute Rehab Dept ([facility]) [phone] Pager [phone]  02/17/2020   [facility] 02/17/2020,  3:45 PM

## 2020-02-17 NOTE — Op Note (Signed)
NAME: VONCILE, DENBOW MEDICAL RECORD [id number] ACCOUNT [id number] DATE OF BIRTH:1950-09-08 FACILITY: [facility] LOCATION: [facility] PHYSICIAN:Khali Albanese Aretha Parrot, MD  OPERATIVE REPORT  DATE OF PROCEDURE:  02/17/2020  PREOPERATIVE DIAGNOSIS:  Primary osteoarthritis and degenerative joint disease, left hip.  POSTOPERATIVE DIAGNOSIS:  Primary osteoarthritis and degenerative joint disease, left hip.  PROCEDURE:  Left total hip arthroplasty through direct anterior approach.  IMPLANTS:  DePuy Sector Gription acetabular component size 50, size 32+4 neutral polyethylene liner, size 11 Corail femoral component with varus offset, size 32+1 metal hip ball.  SURGEON:  Doneen Poisson, MD  ASSISTANT:  Richardean Canal, PA-C  ANESTHESIA:  General.  ANTIBIOTICS:  3 grams IV Ancef.  ESTIMATED BLOOD LOSS:  400 mL.  COMPLICATIONS:  None.  INDICATIONS:  The patient is a 69 year old morbidly obese female with debilitating arthritis involving her left hip.  She has had extensive back surgery in the past and has had both her knees replaced.  At this point, her left hip pain is daily and is  detrimentally affecting her mobility, her quality of life and her activities of daily living.  She has difficulty mobilizing and losing weight at this point, given the severe arthritis of her left hip.  She does wish to proceed with a total hip  arthroplasty.  She understands fully this is going to be quite a complicated surgery due to her obesity and BMI of 48.  The main risk would be implant failure, dislocation and skin and soft tissue issues as well as acute blood loss anemia.  The goals of  surgery are hopefully decreased pain, improved mobility and overall improved quality of life.  DESCRIPTION OF PROCEDURE:  After informed consent was obtained and appropriate left hip was marked.  She was brought to the operating room and general anesthesia was obtained while she was on her stretcher.  A Foley catheter  was placed and traction boots  were placed on both her feet.  Next, she was placed supine on the Hana fracture table, the perineal post in place and both legs in line skeletal traction device and no traction applied.  Her left operative hip was prepped and draped with DuraPrep and  sterile drapes.  A time-out was called and she was identified as correct patient, correct left hip.  I then made an incision just inferior and posterior to the anterior superior iliac spine and carried this obliquely down the leg.  We dissected down  tensor fascia lata muscle.  Tensor fascia was then divided longitudinally to proceed with direct anterior approach to the hip.  We identified and cauterized circumflex vessels and identified the hip capsule.  I did open up the hip capsule in an L-type  format, finding moderate joint effusion.  I placed Cobra retractors within the joint capsule on the medial and lateral femoral neck.  I made a femoral neck cut with an oscillating saw just proximal to the lesser trochanter and completed this with  osteotome.  I placed a corkscrew guide in the femoral head and removed the femoral head in its entirety and found a wide area devoid of cartilage.  Of note, that hip was significantly stiff.  When I examined the left hip, comparing the left and right  hips under general anesthesia.  I then placed a bent Hohmann over the medial acetabular rim and removed remnants of the acetabular labrum and other debris.  I then began reaming under direct visualization from a size 44 reamer in stepwise increments up  to a size  49 with all reamers under direct visualization, the last reamer was placed under direct fluoroscopy as well, so we could obtain our depth of reaming, our inclination and anteversion.  I then placed the real DePuy Sector Gription acetabular  component size 50 and a 32+4 neutral polyethylene liner for that size acetabular component.  Attention was then turned to the femur.  With the leg  externally rotated to 120 degrees, extended and adducted, I was able to place a Mueller retractor medially  and Hohmann retractor above the greater trochanter.  We released the lateral joint capsule and used a box-cutting osteotome to enter the femoral canal and a rongeur to lateralize.  We then began broaching using the Corail broaching system from a size 8  going up to a size 11.  With the 11 in place, we trialed a standard offset femoral neck and a 32+1 hip ball.  That definitely made her too long.  We felt that we should go with the real varus neck, which would shorten her.  She was stable on exam.  We  removed all trial instrumentation after dislocating the hip and then placed the real Corail femoral component size 11 with varus offset and a 32+1 metal hip ball and again reduced this in the acetabulum.  We were pleased with the leg length, offset,  range of motion and stability assessed mechanically and radiographically.  We then irrigated the soft tissue with normal saline solution using pulsatile lavage.  We closed the joint capsule with interrupted #1 Ethibond suture, followed by the tensor  fascia was closed with #1 Vicryl.  A 0 Vicryl was used to close the deep tissue and 2-0 Vicryl was used to close the subcutaneous tissue.  The skin was reapproximated with 2-0 nylon suture.  Xeroform well-padded, sterile dressing was applied.  She was  taken off the Hana table, awakened, extubated, and taken to recovery room in stable condition with all final counts being correct.  No complications noted.  Note Rexene Edison, PA-C did assist during the entire case and assistance was crucial for  facilitating all aspects of this case from beginning to end.  HN/NUANCE  D:02/17/2020 T:02/17/2020 JOB:[id number]

## 2020-02-17 NOTE — Brief Op Note (Signed)
02/17/2020  11:48 AM  PATIENT:  [name]  69 y.o. female  PRE-OPERATIVE DIAGNOSIS:  Osteoarthritis Left Hip  POST-OPERATIVE DIAGNOSIS:  Osteoarthritis Left Hip  PROCEDURE:  Procedure(s): LEFT TOTAL HIP ARTHROPLASTY ANTERIOR APPROACH (Left)  SURGEON:  Surgeon(s) and Role:    Mcarthur Rossetti, MD - Primary  PHYSICIAN ASSISTANT:  Benita Stabile, PA-C  ANESTHESIA:   general  EBL:  400 mL   COUNTS:  YES  DICTATION: .Other Dictation: Dictation Number [id number]  PLAN OF CARE: Admit for overnight observation  PATIENT DISPOSITION:  PACU - hemodynamically stable.   Delay start of Pharmacological VTE agent (>24hrs) due to surgical blood loss or risk of bleeding: no

## 2020-02-17 NOTE — Telephone Encounter (Signed)
Got this late yesterday.  Her surgery was approved as overnight observation.  Insurance will need to be notified if she stays longer and is inpatient.  Thanks!  Geraline Halberstadt

## 2020-02-17 NOTE — Progress Notes (Signed)
Chaplain engaged in initial visit with [name] and her husband.  Chaplain explained her role and offered support.  [name] expressed that she just had a hip procedure due to arthritis.  Her husband conveyed that they had gotten to the hospital early today in preparation for the procedure.  He expressed that they often stay up all night watching [name] but that they got to go to sleep early last night to be at the hospital.  He also conveyed their love of watching sports and keeping up with games.  Chaplain offered the ministries of presence and listening.  Chaplain will follow-up.    02/17/20 1500  Clinical Encounter Type  Visited With Patient and family together  Visit Type Initial

## 2020-02-17 NOTE — Anesthesia Procedure Notes (Addendum)
Procedure Name: Intubation Date/Time: 02/17/2020 10:11 AM Performed by: Gerald Leitz, CRNA Pre-anesthesia Checklist: Patient identified, Patient being monitored, Timeout performed, Emergency Drugs available and Suction available Patient Re-evaluated:Patient Re-evaluated prior to induction Oxygen Delivery Method: Circle system utilized Preoxygenation: Pre-oxygenation with 100% oxygen Induction Type: IV induction Ventilation: Mask ventilation without difficulty Laryngoscope Size: Mac and 3 Grade View: Grade I Tube type: Oral Tube size: 7.0 mm Number of attempts: 1 Placement Confirmation: ETT inserted through vocal cords under direct vision,  positive ETCO2 and breath sounds checked- equal and bilateral Secured at: 21 cm Tube secured with: Tape Dental Injury: Teeth and Oropharynx as per pre-operative assessment

## 2020-02-17 NOTE — Interval H&P Note (Signed)
History and Physical Interval Note: The patient understands that she is here for a left total hip arthroplasty to treat the severe arthritis of her left hip.  She is morbidly obese and understands this will be quite a difficult procedure due to her obesity.  There has been no interval change in her health status.  See recent H&P.  The risk and benefits of surgery been discussed in detail and informed consent was obtained.  02/17/2020 8:43 AM  [name]  has presented today for surgery, with the diagnosis of Osteoarthritis Left Hip.  The various methods of treatment have been discussed with the patient and family. After consideration of risks, benefits and other options for treatment, the patient has consented to  Procedure(s): LEFT TOTAL HIP ARTHROPLASTY ANTERIOR APPROACH (Left) as a surgical intervention.  The patient's history has been reviewed, patient examined, no change in status, stable for surgery.  I have reviewed the patient's chart and labs.  Questions were answered to the patient's satisfaction.     Mcarthur Rossetti

## 2020-02-17 NOTE — Anesthesia Postprocedure Evaluation (Signed)
Anesthesia Post Note  Patient: [name]  Procedure(s) Performed: LEFT TOTAL HIP ARTHROPLASTY ANTERIOR APPROACH (Left Hip)     Patient location during evaluation: PACU Anesthesia Type: General Level of consciousness: awake and alert Pain management: pain level controlled Vital Signs Assessment: post-procedure vital signs reviewed and stable Respiratory status: spontaneous breathing, nonlabored ventilation and respiratory function stable Cardiovascular status: blood pressure returned to baseline and stable Postop Assessment: no apparent nausea or vomiting Anesthetic complications: no   No complications documented.  Last Vitals:  Vitals:   02/17/20 1346 02/17/20 1444  BP: (!) 168/93 (!) 141/91  Pulse: 82 76  Resp: 16 18  Temp: 36.5 C 36.6 C  SpO2: 95% 100%    Last Pain:  Vitals:   02/17/20 1500  TempSrc:   PainSc: [place]

## 2020-02-17 NOTE — Transfer of Care (Signed)
Immediate Anesthesia Transfer of Care Note  Patient: [name]  Procedure(s) Performed: Procedure(s): LEFT TOTAL HIP ARTHROPLASTY ANTERIOR APPROACH (Left)  Patient Location: PACU  Anesthesia Type:General  Level of Consciousness: Alert, Awake, Oriented  Airway & Oxygen Therapy: Patient Spontanous Breathing  Post-op Assessment: Report given to RN  Post vital signs: Reviewed and stable  Last Vitals:  Vitals:   02/17/20 0751  BP: (!) 159/72  Pulse: 86  Resp: 16  Temp: 36.6 C  SpO2: 70%    Complications: No apparent anesthesia complications

## 2020-02-18 DIAGNOSIS — Z79899 Other long term (current) drug therapy: Secondary | ICD-10-CM | POA: Diagnosis not present

## 2020-02-18 DIAGNOSIS — Z888 Allergy status to other drugs, medicaments and biological substances status: Secondary | ICD-10-CM | POA: Diagnosis not present

## 2020-02-18 DIAGNOSIS — K219 Gastro-esophageal reflux disease without esophagitis: Secondary | ICD-10-CM | POA: Diagnosis present

## 2020-02-18 DIAGNOSIS — Z981 Arthrodesis status: Secondary | ICD-10-CM | POA: Diagnosis not present

## 2020-02-18 DIAGNOSIS — Z86711 Personal history of pulmonary embolism: Secondary | ICD-10-CM | POA: Diagnosis not present

## 2020-02-18 DIAGNOSIS — E538 Deficiency of other specified B group vitamins: Secondary | ICD-10-CM | POA: Diagnosis present

## 2020-02-18 DIAGNOSIS — Z6841 Body Mass Index (BMI) 40.0 and over, adult: Secondary | ICD-10-CM | POA: Diagnosis not present

## 2020-02-18 DIAGNOSIS — Z8349 Family history of other endocrine, nutritional and metabolic diseases: Secondary | ICD-10-CM | POA: Diagnosis not present

## 2020-02-18 DIAGNOSIS — Z86010 Personal history of colonic polyps: Secondary | ICD-10-CM | POA: Diagnosis not present

## 2020-02-18 DIAGNOSIS — E119 Type 2 diabetes mellitus without complications: Secondary | ICD-10-CM | POA: Diagnosis present

## 2020-02-18 DIAGNOSIS — M1612 Unilateral primary osteoarthritis, left hip: Secondary | ICD-10-CM | POA: Diagnosis present

## 2020-02-18 DIAGNOSIS — E559 Vitamin D deficiency, unspecified: Secondary | ICD-10-CM | POA: Diagnosis present

## 2020-02-18 DIAGNOSIS — Z96652 Presence of left artificial knee joint: Secondary | ICD-10-CM | POA: Diagnosis present

## 2020-02-18 DIAGNOSIS — Z823 Family history of stroke: Secondary | ICD-10-CM | POA: Diagnosis not present

## 2020-02-18 LAB — BASIC METABOLIC PANEL
Anion gap: 11 (ref 5–15)
BUN: 15 mg/dL (ref 8–23)
CO2: 25 mmol/L (ref 22–32)
Calcium: 8.7 mg/dL — ABNORMAL LOW (ref 8.9–10.3)
Chloride: 102 mmol/L (ref 98–111)
Creatinine, Ser: 0.86 mg/dL (ref 0.44–1.00)
GFR, Estimated: >60 mL/min (ref >60)
Glucose, Bld: 139 mg/dL — ABNORMAL HIGH (ref 70–99)
Potassium: 4.3 mmol/L (ref 3.5–5.1)
Sodium: 138 mmol/L (ref 135–145)

## 2020-02-18 LAB — CBC
HCT: 35.2 % — ABNORMAL LOW (ref 36.0–46.0)
Hemoglobin: 11.5 g/dL — ABNORMAL LOW (ref 12.0–15.0)
MCH: 28.3 pg (ref 26.0–34.0)
MCHC: 32.7 g/dL (ref 30.0–36.0)
MCV: 86.5 fL (ref 80.0–100.0)
Platelets: 218 10*3/uL (ref 150–400)
RBC: 4.07 MIL/uL (ref 3.87–5.11)
RDW: 13 % (ref 11.5–15.5)
WBC: 12.2 10*3/uL — ABNORMAL HIGH (ref 4.0–10.5)
nRBC: 0 % (ref 0.0–0.2)

## 2020-02-18 MED ORDER — SODIUM CHLORIDE 0.9 % IV BOLUS
500.0000 mL | Freq: Once | INTRAVENOUS | Status: AC
  Administered 2020-02-18: 500 mL via INTRAVENOUS

## 2020-02-18 NOTE — Progress Notes (Signed)
Physical Therapy Treatment Patient Details Name: [name] MRN: [id number] DOB: March 31, 1951 Today's Date: 02/18/2020    History of Present Illness s/p L DA THA. PMH: L TKA, obesity, heart murmur, PE, anemia, spine surgery    PT Comments    incr tolerance to activity/incr gait distance however continues to be limited by pain and fatigue. Will benefit from SNF post acute.   Follow Up Recommendations  Follow surgeon's recommendation for DC plan and follow-up therapies;SNF     Equipment Recommendations  None recommended by PT    Recommendations for Other Services       Precautions / Restrictions Precautions Precautions: Fall Restrictions Weight Bearing Restrictions: No LLE Weight Bearing: Weight bearing as tolerated    Mobility  Bed Mobility Overal bed mobility: Needs Assistance Bed Mobility: Supine to Sit     Supine to sit: Min assist;HOB elevated;Mod assist     General bed mobility comments: assist with LLE off bed,  light assist with trunk, incr time   Transfers Overall transfer level: Needs assistance Equipment used: Rolling walker (2 wheeled) Transfers: Sit to/from Stand Sit to Stand: Min assist         General transfer comment: cues for hand placement and safety  Ambulation/Gait Ambulation/Gait assistance: Min assist Gait Distance (Feet): 40 Feet Assistive device: Rolling walker (2 wheeled) Gait Pattern/deviations: Step-to pattern;Decreased stance time - left;Wide base of support     General Gait Details: cues for sequence, RW position, limited by fatigue and pain   Stairs             Wheelchair Mobility    Modified Rankin (Stroke Patients Only)       Balance           Standing balance support: Bilateral upper extremity supported Standing balance-Leahy Scale: Poor Standing balance comment: reliant on UEs                            Cognition Arousal/Alertness: Awake/alert Behavior During Therapy: WFL for  tasks assessed/performed;Flat affect Overall Cognitive Status: Within Functional Limits for tasks assessed                                        Exercises Total Joint Exercises Ankle Circles/Pumps: AROM;15 reps;Both    General Comments        Pertinent Vitals/Pain Pain Assessment: 0-10 Pain Score: 5  Pain Location: left hip Pain Descriptors / Indicators: Grimacing;Sore Pain Intervention(s): Limited activity within patient's tolerance;Monitored during session;Premedicated before session;Repositioned;Ice applied    Home Living                      Prior Function            PT Goals (current goals can now be found in the care plan section) Acute Rehab PT Goals Patient Stated Goal: home after rehab PT Goal Formulation: With patient/family Time For Goal Achievement: 02/24/20 Potential to Achieve Goals: Good Progress towards PT goals: Progressing toward goals    Frequency    7X/week      PT Plan Current plan remains appropriate    Co-evaluation              AM-PAC PT "6 Clicks" Mobility   Outcome Measure  Help needed turning from your back to your side while in a flat bed without using bedrails?: A  Little Help needed moving from lying on your back to sitting on the side of a flat bed without using bedrails?: A Little Help needed moving to and from a bed to a chair (including a wheelchair)?: A Little Help needed standing up from a chair using your arms (e.g., wheelchair or bedside chair)?: A Little Help needed to walk in hospital room?: A Little Help needed climbing 3-5 steps with a railing? : A Lot 6 Click Score: 17    End of Session Equipment Utilized During Treatment: Gait belt Activity Tolerance: Patient tolerated treatment well Patient left: in chair;with call bell/phone within reach;with chair alarm set Nurse Communication: Mobility status PT Visit Diagnosis: Difficulty in walking, not elsewhere classified (R26.2)      Time: 1610-9604 PT Time Calculation (min) (ACUTE ONLY): 13 min  Charges:  $Gait Training: 8-22 mins                     Delice Bison, PT  Acute Rehab Dept ([facility]) [phone] Pager [phone]  02/18/2020    [facility] 02/18/2020, 11:06 AM

## 2020-02-18 NOTE — Discharge Instructions (Signed)

## 2020-02-18 NOTE — NC FL2 (Signed)
[place] MEDICAID FL2 LEVEL OF CARE SCREENING TOOL     IDENTIFICATION  Patient Name: [name] Birthdate: 1950/07/09 Sex: female Admission Date (Current Location): 02/17/2020  [facility] and [place] Number:  Producer, television/film/video and Address:  [facility],  [address] [place], [place] [address]      Provider Number: [id number]  Attending Physician Name and Address:  Kathryne Hitch,*  Relative Name and Phone Number:  Glatt,George (Spouse) [phone]    Current Level of Care: Hospital Recommended Level of Care: Skilled Nursing Facility Prior Approval Number:    Date Approved/Denied:   PASRR Number: [id number]  Discharge Plan: SNF    Current Diagnoses: Patient Active Problem List   Diagnosis Date Noted  . Status post total replacement of left hip 02/17/2020  . Unilateral primary osteoarthritis, left hip 02/16/2020  . Diarrhea   . B12 deficiency 01/08/2018  . Vitamin D deficiency 01/08/2018  . History of pulmonary embolism 01/08/2018  . Status post total left knee replacement 10/13/2017  . Family history of thyroid disease 08/25/2017  . Chronic pain of left knee 05/20/2017  . Unilateral primary osteoarthritis, left knee 05/20/2017  . Morbid obesity (HCC) 04/30/2017  . Family history of ovarian cancer 02/17/2017  . IBS (irritable bowel syndrome) 01/21/2017  . Dyspnea 10/08/2016  . Paroxysmal atrial fibrillation (HCC) 06/18/2016  . Type 2 diabetes mellitus without complication, without long-term current use of insulin (HCC) 01/21/2016  . H/O Spinal surgery 12/21/2015  . Barrett's esophagus 12/21/2015  . Hx of adenomatous polyp of colon 03/01/2002    Orientation RESPIRATION BLADDER Height & Weight     Self, Time, Situation, Place  Normal Continent Weight: 266 lb 6.4 oz (120.8 kg) Height:  5\' 2"  (157.5 cm)  BEHAVIORAL SYMPTOMS/MOOD NEUROLOGICAL BOWEL NUTRITION STATUS      Continent Diet  AMBULATORY STATUS COMMUNICATION OF NEEDS Skin    Limited Assist Verbally Normal                       Personal Care Assistance Level of Assistance  Bathing, Feeding, Dressing Bathing Assistance: Limited assistance Feeding assistance: Independent Dressing Assistance: Limited assistance     Functional Limitations Info  Sight, Hearing, Speech Sight Info: Adequate Hearing Info: Adequate Speech Info: Adequate    SPECIAL CARE FACTORS FREQUENCY                       Contractures Contractures Info: Not present    Additional Factors Info                  Current Medications (02/18/2020):  This is the current hospital active medication list Current Facility-Administered Medications  Medication Dose Route Frequency Provider Last Rate Last Admin  . 0.9 %  sodium chloride infusion   Intravenous Continuous Kathryne Hitch, MD 10 mL/hr at 02/18/20 0746 Rate Verify at 02/18/20 0746  . acetaminophen (TYLENOL) tablet 325-650 mg  325-650 mg Oral Q6H PRN Kathryne Hitch, MD   650 mg at 02/18/20 0817  . alum & mag hydroxide-simeth (MAALOX/MYLANTA) 200-200-20 MG/5ML suspension 30 mL  30 mL Oral Q4H PRN Kathryne Hitch, MD      . apixaban Everlene Balls) tablet 5 mg  5 mg Oral BID Kathryne Hitch, MD   5 mg at 02/18/20 0817  . diphenhydrAMINE (BENADRYL) 12.5 MG/5ML elixir 12.5-25 mg  12.5-25 mg Oral Q4H PRN Kathryne Hitch, MD      . docusate sodium (COLACE)  capsule 100 mg  100 mg Oral BID Kathryne Hitch, MD   100 mg at 02/18/20 0817  . HYDROmorphone (DILAUDID) injection 0.5-1 mg  0.5-1 mg Intravenous Q4H PRN Kathryne Hitch, MD   0.5 mg at 02/18/20 1244  . influenza vaccine adjuvanted (FLUAD) injection 0.5 mL  0.5 mL Intramuscular Prior to discharge Tilden Fossa, RN      . menthol-cetylpyridinium (CEPACOL) lozenge 3 mg  1 lozenge Oral PRN Kathryne Hitch, MD       Or  . phenol (CHLORASEPTIC) mouth spray 1 spray  1 spray Mouth/Throat PRN Kathryne Hitch, MD       . methocarbamol (ROBAXIN) tablet 500 mg  500 mg Oral Q6H PRN Kathryne Hitch, MD   500 mg at 02/18/20 1155   Or  . methocarbamol (ROBAXIN) 500 mg in dextrose 5 % 50 mL IVPB  500 mg Intravenous Q6H PRN Kathryne Hitch, MD      . metoCLOPramide (REGLAN) tablet 5-10 mg  5-10 mg Oral Q8H PRN Kathryne Hitch, MD       Or  . metoCLOPramide (REGLAN) injection 5-10 mg  5-10 mg Intravenous Q8H PRN Kathryne Hitch, MD   10 mg at 02/17/20 2025  . metoprolol tartrate (LOPRESSOR) tablet 25 mg  25 mg Oral BID Kathryne Hitch, MD   25 mg at 02/18/20 0818  . ondansetron (ZOFRAN) tablet 4 mg  4 mg Oral Q6H PRN Kathryne Hitch, MD       Or  . ondansetron [facility]) injection 4 mg  4 mg Intravenous Q6H PRN Kathryne Hitch, MD   4 mg at 02/18/20 0252  . oxyCODONE (Oxy IR/ROXICODONE) immediate release tablet 10-15 mg  10-15 mg Oral Q4H PRN Kathryne Hitch, MD   10 mg at 02/18/20 1056  . oxyCODONE (Oxy IR/ROXICODONE) immediate release tablet 5-10 mg  5-10 mg Oral Q4H PRN Kathryne Hitch, MD      . pantoprazole (PROTONIX) EC tablet 40 mg  40 mg Oral Daily Kathryne Hitch, MD   40 mg at 02/18/20 0818  . pneumococcal 23 valent vaccine (PNEUMOVAX-23) injection 0.5 mL  0.5 mL Intramuscular Prior to discharge Tilden Fossa, RN      . polyethylene glycol (MIRALAX / GLYCOLAX) packet 17 g  17 g Oral Daily PRN Kathryne Hitch, MD         Discharge Medications: Please see discharge summary for a list of discharge medications.  Relevant Imaging Results:  Relevant Lab Results:   Additional Information SS# [id number]  Joseph Art, LCSWA

## 2020-02-18 NOTE — Plan of Care (Signed)
  Problem: Pain Management: Goal: Pain level will decrease with appropriate interventions Outcome: Progressing   Problem: Clinical Measurements: Goal: Will remain free from infection Outcome: Progressing   Problem: Coping: Goal: Level of anxiety will decrease Outcome: Progressing   Problem: Safety: Goal: Ability to remain free from injury will improve Outcome: Progressing   Problem: Skin Integrity: Goal: Risk for impaired skin integrity will decrease Outcome: Progressing

## 2020-02-18 NOTE — Progress Notes (Signed)
Subjective: 1 Day Post-Op Procedure(s) (LRB): LEFT TOTAL HIP ARTHROPLASTY ANTERIOR APPROACH (Left) Patient reports pain as moderate.    Objective: Vital signs in last 24 hours: Temp:  [97.5 F (36.4 C)-99 F (37.2 C)] 98.7 F (37.1 C) (10/30 0957) Pulse Rate:  [63-115] 78 (10/30 0957) Resp:  [11-18] 18 (10/30 0957) BP: (105-184)/(49-102) 105/49 (10/30 0957) SpO2:  [87 %-100 %] 92 % (10/30 0957)  Intake/Output from previous day: 10/29 0701 - 10/30 0700 In: 3832.2 [P.O.:780; I.V.:2702.2; IV Piggyback:350] Out: 1700 [Urine:1000; Emesis/NG output:300; Blood:400] Intake/Output this shift: Total I/O In: 312.1 [P.O.:240; I.V.:72.1] Out: -   Recent Labs    02/18/20 0314  HGB 11.5*   Recent Labs    02/18/20 0314  WBC 12.2*  RBC 4.07  HCT 35.2*  PLT 218   Recent Labs    02/18/20 0314  NA 138  K 4.3  CL 102  CO2 25  BUN 15  CREATININE 0.86  GLUCOSE 139*  CALCIUM 8.7*   No results for input(s): LABPT, INR in the last 72 hours.  Sensation intact distally Intact pulses distally Dorsiflexion/Plantar flexion intact Incision: dressing C/D/I   Assessment/Plan: 1 Day Post-Op Procedure(s) (LRB): LEFT TOTAL HIP ARTHROPLASTY ANTERIOR APPROACH (Left) Up with therapy The patient is insistent that she be discharged to skilled nursing facility for short-term acute rehab due to having no assistance at home and anyone that can "help take care of her". We will thus need to consult the transitional care team for short-term skilled nursing placement. She will also need to be made an inpatient admission since she will be here through the weekend.     Mcarthur Rossetti 02/18/2020, 10:23 AM

## 2020-02-18 NOTE — TOC Initial Note (Addendum)
Transition of Care [facility]) - Initial/Assessment Note    Patient Details  Name: [name] MRN: [id number] Date of Birth: Nov 18, 1950  [facility] CM/SW Contact:    Marina Goodell Phone Number: [phone] 02/18/2020, 12:48 PM  Clinical Narrative:                  CSW spoke with patient and explained role of TOC in patient care. Patient stated she wanted to go to SNF placement after d/c.  CSW explained SNF placement process, including how insurance could affect which SNF facilities would offer a bed. Patient stated she has been placed in SNF with her current insurance in the past and that she would like [facility] as her preferred SNF.  CSW verbalized understanding and told patient I would include [facility] in the search for SNF placement.  CSW inquired about Medicare and patient stated she is still working and has not applied for [employer]. Patient is able to perform all ADLs and is able to work.  Patient's main concern about returning home before SNF placement, is the level of care and assistance she will require her husband will not be able to provide.   FL2 signed, PASRR# [id number], SNF info sent, preferred SNF is [facility]  Expected Discharge Plan: Skilled Nursing Facility Barriers to Discharge: Continued Medical Work up   Patient Goals and CMS Choice Patient states their goals for this hospitalization and ongoing recovery are:: Patient states she woudl like to go to a SNF, "like [facility], because my husband cannot take care of me." CMS Medicare.gov Compare Post Acute Care list provided to:: Patient Choice offered to / list presented to : Patient  Expected Discharge Plan and Services Expected Discharge Plan: Skilled Nursing Facility In-house Referral: Clinical Social Work   Post Acute Care Choice: Skilled Nursing Facility Living arrangements for the past 2 months: Single Family Home                                      Prior  Living Arrangements/Services Living arrangements for the past 2 months: Single Family Home Lives with:: Spouse (Vanvleck,George (Spouse) [phone]) Patient language and need for interpreter reviewed:: Yes Do you feel safe going back to the place where you live?: Yes      Need for Family Participation in Patient Care: Yes (Comment) Care giver support system in place?: Yes (comment)   Criminal Activity/Legal Involvement Pertinent to Current Situation/Hospitalization: No - Comment as needed  Activities of Daily Living Home Assistive Devices/Equipment: Eyeglasses, Cane (specify quad or straight), Walker (specify type), Dentures (specify type), Grab bars around toilet, Grab bars in shower, Hand-held shower hose, Shower chair with back ADL Screening (condition at time of admission) Patient's cognitive ability adequate to safely complete daily activities?: Yes Is the patient deaf or have difficulty hearing?: No Does the patient have difficulty seeing, even when wearing glasses/contacts?: No Does the patient have difficulty concentrating, remembering, or making decisions?: No Patient able to express need for assistance with ADLs?: Yes Does the patient have difficulty dressing or bathing?: No Independently performs ADLs?: Yes (appropriate for developmental age) Does the patient have difficulty walking or climbing stairs?: Yes Weakness of Legs: Left Weakness of Arms/Hands: None  Permission Sought/Granted Permission sought to share information with : Family Supports    Share Information with NAME: Britz,George (Spouse) [phone]  Emotional Assessment Appearance:: Appears stated age Attitude/Demeanor/Rapport: Engaged Affect (typically observed): Pleasant Orientation: : Oriented to Self, Oriented to Place, Oriented to  Time, Oriented to Situation Alcohol / Substance Use: Not Applicable Psych Involvement: No (comment)  Admission diagnosis:  Status post total replacement  of left hip [Z61.096] Patient Active Problem List   Diagnosis Date Noted  . Status post total replacement of left hip 02/17/2020  . Unilateral primary osteoarthritis, left hip 02/16/2020  . Diarrhea   . B12 deficiency 01/08/2018  . Vitamin D deficiency 01/08/2018  . History of pulmonary embolism 01/08/2018  . Status post total left knee replacement 10/13/2017  . Family history of thyroid disease 08/25/2017  . Chronic pain of left knee 05/20/2017  . Unilateral primary osteoarthritis, left knee 05/20/2017  . Morbid obesity (HCC) 04/30/2017  . Family history of ovarian cancer 02/17/2017  . IBS (irritable bowel syndrome) 01/21/2017  . Dyspnea 10/08/2016  . Paroxysmal atrial fibrillation (HCC) 06/18/2016  . Type 2 diabetes mellitus without complication, without long-term current use of insulin (HCC) 01/21/2016  . H/O Spinal surgery 12/21/2015  . Barrett's esophagus 12/21/2015  . Hx of adenomatous polyp of colon 03/01/2002   PCP:  Lavada Mesi, MD Pharmacy:   [facility] [address], [place] - [address] [place] [address] Phone: [phone] Fax: [phone]     Social Determinants of Health (SDOH) Interventions    Readmission Risk Interventions No flowsheet data found.

## 2020-02-18 NOTE — Progress Notes (Signed)
Dr. Ninfa Linden notified of pt not being able to void to this point in the shift. Bolus ordered and will be given.

## 2020-02-18 NOTE — Progress Notes (Signed)
   02/18/20 1500  PT Visit Information  Last PT Received On 02/18/20  Pt reports being fatigued this afternoon, declines OOB, is now eating lunch. Agreeable to exercises in bed. Reviewed THA HEP, pt tol well. Will continue to follow and proceed with PT POC  Assistance Needed +1  History of Present Illness s/p L DA THA. PMH: L TKA, obesity, heart murmur, PE, anemia, spine surgery  Subjective Data  Patient Stated Goal home after rehab  Precautions  Precautions Fall  Restrictions  LLE Weight Bearing WBAT  Pain Assessment  Pain Assessment 0-10  Pain Score 5  Pain Location left hip  Pain Descriptors / Indicators Grimacing;Sore  Pain Intervention(s) Limited activity within patient's tolerance;Monitored during session;Premedicated before session;Repositioned  Cognition  Arousal/Alertness Awake/alert  Behavior During Therapy WFL for tasks assessed/performed;Flat affect  Overall Cognitive Status Within Functional Limits for tasks assessed  Total Joint Exercises  Ankle Circles/Pumps AROM;15 reps;Both  Quad Sets AROM;10 reps;Both  Heel Slides AAROM;Left;10 reps  Hip ABduction/ADduction Left;10 reps;AAROM  PT - End of Session  Activity Tolerance Patient tolerated treatment well  Patient left with call bell/phone within reach;in bed;with bed alarm set  Nurse Communication Mobility status   PT - Assessment/Plan  PT Plan Current plan remains appropriate  PT Visit Diagnosis Difficulty in walking, not elsewhere classified (R26.2)  PT Frequency (ACUTE ONLY) 7X/week  Follow Up Recommendations Follow surgeon's recommendation for DC plan and follow-up therapies;SNF  PT equipment None recommended by PT  AM-PAC PT "6 Clicks" Mobility Outcome Measure (Version 2)  Help needed turning from your back to your side while in a flat bed without using bedrails? 3  Help needed moving from lying on your back to sitting on the side of a flat bed without using bedrails? 3  Help needed moving to and from a bed  to a chair (including a wheelchair)? 3  Help needed standing up from a chair using your arms (e.g., wheelchair or bedside chair)? 3  Help needed to walk in hospital room? 3  Help needed climbing 3-5 steps with a railing?  2  6 Click Score 17  Consider Recommendation of Discharge To: Home with [facility]  PT Goal Progression  Progress towards PT goals Progressing toward goals  Acute Rehab PT Goals  PT Goal Formulation With patient/family  Time For Goal Achievement 02/24/20  Potential to Achieve Goals Good  PT Time Calculation  PT Start Time (ACUTE ONLY) 1506  PT Stop Time (ACUTE ONLY) 1518  PT Time Calculation (min) (ACUTE ONLY) 12 min  PT General Charges  $$ ACUTE PT VISIT 1 Visit  PT Treatments  $Therapeutic Exercise 8-22 mins

## 2020-02-19 NOTE — Plan of Care (Signed)
  Problem: Clinical Measurements: Goal: Postoperative complications will be avoided or minimized Outcome: Progressing   Problem: Pain Management: Goal: Pain level will decrease with appropriate interventions Outcome: Progressing   Problem: Clinical Measurements: Goal: Ability to maintain clinical measurements within normal limits will improve Outcome: Progressing   Problem: Clinical Measurements: Goal: Will remain free from infection Outcome: Progressing   Problem: Activity: Goal: Risk for activity intolerance will decrease Outcome: Progressing   Problem: Nutrition: Goal: Adequate nutrition will be maintained Outcome: Progressing   Problem: Skin Integrity: Goal: Risk for impaired skin integrity will decrease Outcome: Progressing

## 2020-02-19 NOTE — Plan of Care (Signed)
Plan of care reviewed and discussed with the patient. 

## 2020-02-19 NOTE — Progress Notes (Signed)
  Subjective: Pt stable - pain ok - made it to second square outside room   Objective: Vital signs in last 24 hours: Temp:  [98.6 F (37 C)-101.1 F (38.4 C)] 99.4 F (37.4 C) (10/31 0529) Pulse Rate:  [78-97] 84 (10/31 0529) Resp:  [14-20] 20 (10/31 0529) BP: (105-143)/(49-63) 131/52 (10/31 0529) SpO2:  [90 %-95 %] 95 % (10/31 0529)  Intake/Output from previous day: 10/30 0701 - 10/31 0700 In: 2851.3 [P.O.:1420; I.V.:864.5; IV Piggyback:566.8] Out: 1150 [Urine:1150] Intake/Output this shift: Total I/O In: 360 [P.O.:360] Out: 300 [Urine:300]  Exam:  Sensation intact distally Dorsiflexion/Plantar flexion intact  Labs: Recent Labs    02/18/20 0314  HGB 11.5*   Recent Labs    02/18/20 0314  WBC 12.2*  RBC 4.07  HCT 35.2*  PLT 218   Recent Labs    02/18/20 0314  NA 138  K 4.3  CL 102  CO2 25  BUN 15  CREATININE 0.86  GLUCOSE 139*  CALCIUM 8.7*   No results for input(s): LABPT, INR in the last 72 hours.  Assessment/Plan: Plan for PT this am and tentative dctomorrow am   [name] 02/19/2020, 8:31 AM

## 2020-02-19 NOTE — Progress Notes (Signed)
   02/19/20 1400  PT Visit Information  Last PT Received On 02/19/20  Pt progressing toward PT goals. Continue to recommend SNF post acute to maximize independence and safety prior to return home. Pt prefers [employer] Needed +1  History of Present Illness s/p L DA THA. PMH: L TKA, obesity, heart murmur, PE, anemia, spine surgery  Subjective Data  Patient Stated Goal home after rehab  Precautions  Precautions Fall  Restrictions  Other Position/Activity Restrictions WBAT  Pain Assessment  Pain Assessment 0-10  Pain Score 4  Pain Location left hip  Pain Descriptors / Indicators Grimacing;Sore  Pain Intervention(s) Limited activity within patient's tolerance;Monitored during session;Premedicated before session;Ice applied  Cognition  Arousal/Alertness Awake/alert  Behavior During Therapy WFL for tasks assessed/performed;Flat affect  Overall Cognitive Status Within Functional Limits for tasks assessed  Total Joint Exercises  Ankle Circles/Pumps AROM;15 reps;Both  Quad Sets AROM;10 reps;Both  Heel Slides AAROM;Left;10 reps  Hip ABduction/ADduction Left;10 reps;AAROM  Gluteal Sets AROM;Both;10 reps  Short Arc [employer];Left;10 reps  PT - End of Session  Activity Tolerance Patient tolerated treatment well  Patient left with call bell/phone within reach;in bed;with bed alarm set  Nurse Communication Mobility status   PT - Assessment/Plan  PT Plan Current plan remains appropriate  PT Visit Diagnosis Difficulty in walking, not elsewhere classified (R26.2)  PT Frequency (ACUTE ONLY) 7X/week  Follow Up Recommendations Follow surgeon's recommendation for DC plan and follow-up therapies;SNF  PT equipment None recommended by PT  AM-PAC PT "6 Clicks" Mobility Outcome Measure (Version 2)  Help needed turning from your back to your side while in a flat bed without using bedrails? 3  Help needed moving from lying on your back to sitting on the side of a flat bed without using  bedrails? 3  Help needed moving to and from a bed to a chair (including a wheelchair)? 3  Help needed standing up from a chair using your arms (e.g., wheelchair or bedside chair)? 3  Help needed to walk in hospital room? 3  Help needed climbing 3-5 steps with a railing?  2  6 Click Score 17  Consider Recommendation of Discharge To: Home with [facility]  PT Goal Progression  Progress towards PT goals Progressing toward goals  Acute Rehab PT Goals  PT Goal Formulation With patient/family  Time For Goal Achievement 02/24/20  Potential to Achieve Goals Good  PT Time Calculation  PT Start Time (ACUTE ONLY) 1438  PT Stop Time (ACUTE ONLY) 1452  PT Time Calculation (min) (ACUTE ONLY) 14 min  PT General Charges  $$ ACUTE PT VISIT 1 Visit  PT Treatments  $Therapeutic Exercise 8-22 mins

## 2020-02-19 NOTE — Progress Notes (Signed)
Physical Therapy Treatment Patient Details Name: [name] MRN: [id number] DOB: [birth year] Today's Date: 02/19/2020    History of Present Illness s/p L DA THA. PMH: L TKA, obesity, heart murmur, PE, anemia, spine surgery    PT Comments    Pt progressing toward PT goals. incr gait distance with 2 standing rest breaks. Continue to follow    Follow Up Recommendations  Follow surgeon's recommendation for DC plan and follow-up therapies;SNF     Equipment Recommendations  None recommended by PT    Recommendations for Other Services       Precautions / Restrictions Precautions Precautions: Fall Restrictions LLE Weight Bearing: Weight bearing as tolerated    Mobility  Bed Mobility Overal bed mobility: Needs Assistance Bed Mobility: Supine to Sit;Sit to Supine     Supine to sit: Min assist;HOB elevated Sit to supine: Min assist;Mod assist   General bed mobility comments: assist with LLE off bed, incr time, HOB elevated. assist to bring bil LEs on to bed.   Transfers Overall transfer level: Needs assistance Equipment used: Rolling walker (2 wheeled) Transfers: Sit to/from [employer] Sit to Stand: Min assist;Min guard Stand pivot transfers: Min guard       General transfer comment: cues for hand placement and safety. incr time, min/guard for safety with stand step pivot to [facility]  Ambulation/Gait Ambulation/Gait assistance: Min assist;Min guard Gait Distance (Feet): 60 Feet Assistive device: Rolling walker (2 wheeled) Gait Pattern/deviations: Step-to pattern;Decreased stance time - left;Wide base of support     General Gait Details: cues for sequence, RW position, limited by fatigue and pain. 2 standing rest breaks d/t fatigue    Stairs             Wheelchair Mobility    Modified Rankin (Stroke Patients Only)       Balance           Standing balance support: Bilateral upper extremity supported Standing balance-Leahy Scale:  Fair Standing balance comment: able to stand and don mask without LOB                            Cognition Arousal/Alertness: Awake/alert Behavior During Therapy: WFL for tasks assessed/performed;Flat affect Overall Cognitive Status: Within Functional Limits for tasks assessed                                        Exercises      General Comments        Pertinent Vitals/Pain Pain Assessment: 0-10 Pain Score: 5  Pain Location: left hip Pain Descriptors / Indicators: Grimacing;Sore Pain Intervention(s): Limited activity within patient's tolerance;Monitored during session;Premedicated before session    Home Living                      Prior Function            PT Goals (current goals can now be found in the care plan section) Acute Rehab PT Goals Patient Stated Goal: home after rehab PT Goal Formulation: With patient/family Time For Goal Achievement: 02/24/20 Potential to Achieve Goals: Good Progress towards PT goals: Progressing toward goals    Frequency    7X/week      PT Plan Current plan remains appropriate    Co-evaluation              AM-PAC PT "  6 Clicks" Mobility   Outcome Measure  Help needed turning from your back to your side while in a flat bed without using bedrails?: A Little Help needed moving from lying on your back to sitting on the side of a flat bed without using bedrails?: A Little Help needed moving to and from a bed to a chair (including a wheelchair)?: A Little Help needed standing up from a chair using your arms (e.g., wheelchair or bedside chair)?: A Little Help needed to walk in hospital room?: A Little Help needed climbing 3-5 steps with a railing? : A Lot 6 Click Score: 17    End of Session Equipment Utilized During Treatment: Gait belt Activity Tolerance: Patient tolerated treatment well Patient left: with call bell/phone within reach;in bed;with bed alarm set Nurse Communication:  Mobility status PT Visit Diagnosis: Difficulty in walking, not elsewhere classified (R26.2)     Time: 4098-1191 PT Time Calculation (min) (ACUTE ONLY): 23 min  Charges:  $Gait Training: 23-37 mins                     Delice Bison, PT  Acute Rehab Dept ([facility]) [phone] Pager [phone]  02/19/2020    [facility] 02/19/2020, 11:46 AM

## 2020-02-20 MED ORDER — METHOCARBAMOL 500 MG PO TABS: 500.0000 mg | ORAL_TABLET | Freq: Four times a day (QID) | ORAL | 0 refills | Status: DC | PRN

## 2020-02-20 MED ORDER — OXYCODONE HCL 5 MG PO TABS
5.0000 mg | ORAL_TABLET | ORAL | 0 refills | Status: DC | PRN
Start: 2020-02-20 — End: 2020-02-21

## 2020-02-20 NOTE — Plan of Care (Signed)
  Problem: Education: Goal: Knowledge of General Education information will improve Description Including pain rating scale, medication(s)/side effects and non-pharmacologic comfort measures Outcome: Progressing   

## 2020-02-20 NOTE — Progress Notes (Signed)
Physical Therapy Treatment Patient Details Name: [name] MRN: [id number] DOB: 1950-11-08 Today's Date: 02/20/2020    History of Present Illness s/p L DA THA. PMH: L TKA, obesity, heart murmur, PE, anemia, spine surgery    PT Comments    Pt progressing well. Reduced assist required with bed mobility. incr tol to activity   Follow Up Recommendations  Follow surgeon's recommendation for DC plan and follow-up therapies;SNF     Equipment Recommendations  None recommended by PT    Recommendations for Other Services       Precautions / Restrictions Precautions Precautions: Fall Restrictions Weight Bearing Restrictions: No LLE Weight Bearing: Weight bearing as tolerated Other Position/Activity Restrictions: WBAT    Mobility  Bed Mobility Overal bed mobility: Needs Assistance Bed Mobility: Supine to Sit     Supine to sit: Min assist;HOB elevated Sit to supine: Min assist   General bed mobility comments: cues for technique and to self assist  LLE with use of gait belt as leg lifter   Transfers Overall transfer level: Needs assistance Equipment used: Rolling walker (2 wheeled) Transfers: Sit to/from Stand Sit to Stand: Min assist         General transfer comment: cues for hand placement and safety. incr time, min/guard for safety with stand step pivot to [facility]  Ambulation/Gait Ambulation/Gait assistance: Min guard;Supervision Gait Distance (Feet): 50 Feet Assistive device: Rolling walker (2 wheeled) Gait Pattern/deviations: Step-to pattern;Decreased stance time - left;Wide base of support     General Gait Details: cues for sequence,  and RW position and posture   Stairs             Wheelchair Mobility    Modified Rankin (Stroke Patients Only)       Balance           Standing balance support: Bilateral upper extremity supported Standing balance-Leahy Scale: Fair Standing balance comment: able to stand and don mask without LOB                             Cognition Arousal/Alertness: Awake/alert Behavior During Therapy: WFL for tasks assessed/performed;Flat affect Overall Cognitive Status: Within Functional Limits for tasks assessed                                        Exercises Total Joint Exercises Ankle Circles/Pumps: AROM;15 reps;Both    General Comments        Pertinent Vitals/Pain Pain Assessment: 0-10 Pain Score: 4  Pain Location: left hip Pain Descriptors / Indicators: Grimacing;Sore Pain Intervention(s): Limited activity within patient's tolerance;Monitored during session;Premedicated before session;Ice applied    Home Living                      Prior Function            PT Goals (current goals can now be found in the care plan section) Acute Rehab PT Goals Patient Stated Goal: home after rehab PT Goal Formulation: With patient/family Time For Goal Achievement: 02/24/20 Potential to Achieve Goals: Good Progress towards PT goals: Progressing toward goals    Frequency    7X/week      PT Plan Current plan remains appropriate    Co-evaluation              AM-PAC PT "6 Clicks" Mobility   Outcome Measure  Help needed turning from your back to your side while in a flat bed without using bedrails?: A Little Help needed moving from lying on your back to sitting on the side of a flat bed without using bedrails?: A Little Help needed moving to and from a bed to a chair (including a wheelchair)?: A Little Help needed standing up from a chair using your arms (e.g., wheelchair or bedside chair)?: A Little Help needed to walk in hospital room?: A Little Help needed climbing 3-5 steps with a railing? : A Lot 6 Click Score: 17    End of Session Equipment Utilized During Treatment: Gait belt Activity Tolerance: Patient tolerated treatment well Patient left: with call bell/phone within reach;in bed;with bed alarm set Nurse Communication: Mobility  status PT Visit Diagnosis: Difficulty in walking, not elsewhere classified (R26.2)     Time: 2376-2831 PT Time Calculation (min) (ACUTE ONLY): 18 min  Charges:  $Gait Training: 8-22 mins                     Delice Bison, PT  Acute Rehab Dept ([facility]) [phone] Pager [phone]  02/20/2020    [facility] 02/20/2020, 12:19 PM

## 2020-02-20 NOTE — Progress Notes (Signed)
   02/20/20 1400  PT Visit Information  Last PT Received On 02/20/20  Assistance Needed +1  History of Present Illness s/p L DA THA. PMH: L TKA, obesity, heart murmur, PE, anemia, spine surgery  Subjective Data  Patient Stated Goal home after rehab  Precautions  Precautions Fall  Restrictions  Weight Bearing Restrictions No  Other Position/Activity Restrictions WBAT  Pain Assessment  Pain Assessment 0-10  Pain Score 4  Pain Location left hip  Pain Descriptors / Indicators Grimacing;Sore  Pain Intervention(s) Limited activity within patient's tolerance;Monitored during session;Ice applied  Cognition  Arousal/Alertness Awake/alert  Behavior During Therapy WFL for tasks assessed/performed;Flat affect  Overall Cognitive Status Within Functional Limits for tasks assessed  Total Joint Exercises  Ankle Circles/Pumps AROM;15 reps;Both  Quad Sets AROM;10 reps;Both  Heel Slides AAROM;Left;10 reps  Hip ABduction/ADduction Left;10 reps;AAROM  Gluteal Sets AROM;Both;10 reps  Short Arc [employer];Left;10 reps  PT - End of Session  Activity Tolerance Patient tolerated treatment well  Patient left with call bell/phone within reach;in bed;with bed alarm set  Nurse Communication Mobility status   PT - Assessment/Plan  PT Plan Current plan remains appropriate  PT Visit Diagnosis Difficulty in walking, not elsewhere classified (R26.2)  PT Frequency (ACUTE ONLY) 7X/week  Follow Up Recommendations Follow surgeon's recommendation for DC plan and follow-up therapies;SNF  PT equipment None recommended by PT  AM-PAC PT "6 Clicks" Mobility Outcome Measure (Version 2)  Help needed turning from your back to your side while in a flat bed without using bedrails? 3  Help needed moving from lying on your back to sitting on the side of a flat bed without using bedrails? 3  Help needed moving to and from a bed to a chair (including a wheelchair)? 3  Help needed standing up from a chair using your arms (e.g.,  wheelchair or bedside chair)? 3  Help needed to walk in hospital room? 3  Help needed climbing 3-5 steps with a railing?  2  6 Click Score 17  Consider Recommendation of Discharge To: Home with [facility]  PT Goal Progression  Progress towards PT goals Progressing toward goals  Acute Rehab PT Goals  PT Goal Formulation With patient/family  Time For Goal Achievement 02/24/20  Potential to Achieve Goals Good  PT Time Calculation  PT Start Time (ACUTE ONLY) 1440  PT Stop Time (ACUTE ONLY) 1455  PT Time Calculation (min) (ACUTE ONLY) 15 min  PT General Charges  $$ ACUTE PT VISIT 1 Visit  PT Treatments  $Therapeutic Exercise 8-22 mins

## 2020-02-20 NOTE — TOC Progression Note (Signed)
Transition of Care [facility]) - Progression Note    Patient Details  Name: [name] MRN: [id number] Date of Birth: 01/12/1951  Transition of Care [facility]) CM/SW [place], [place] Phone Number: 02/20/2020, 2:07 PM  Clinical Narrative:    Patient gave CSW permission to contact her insurance company for in network SNF providers. CSW reached out and was notified the closest SNF provider is [facility] in [place], [place]. The additional SNF's are in [place] and [place].  The patient does have out of network benefits. The insurance will cover 60% and the patient is responsible for 40% of the cost.   CSW provided the patient with a list of in network SNF providers and notified her of the findings.  Patient declined all in network SNF's. She report they are too far. The patient declines out of network SNF's due to her inability to pay the 40%.  CSW provided information for the only SNF bed offer- [facility] (out of network). Patient declined and prefers to go home w/OPPT. Patient reports she went to [facility] in [place], [place] and prefers to use them again. Patient declines Home Health in fear her 27 year old mother will be exposed to covid-19.   Nurse to notify the physician.    Expected Discharge Plan: [place] Barriers to Discharge: No SNF bed, Insurance Authorization  Expected Discharge Plan and Services Expected Discharge Plan: [place] In-house Referral: Clinical Social Work   Post Acute Care Choice: [place] Living arrangements for the past 2 months: Single Family Home Expected Discharge Date: 02/20/20                                     Social Determinants of Health (SDOH) Interventions    Readmission Risk Interventions No flowsheet data found.

## 2020-02-20 NOTE — TOC Progression Note (Addendum)
Transition of Care [facility]) - Progression Note    Patient Details  Name: [name] MRN: [id number] Date of Birth: [birth year]  Transition of Care [facility]) CM/SW [place], [place] Phone Number: 02/20/2020, 9:20 AM  Clinical Narrative:    Patient first SNF choice-[facility] is not accepting new admissions at this time.  Patient gave csw permission to make referral to other SNF's in the area. Patient not sure what facilities are in network with her insurance.  CSW will follow up with bed offers/SNF will have to initiate authorization for insurance approval for rehab.   Expected Discharge Plan: [place] Barriers to Discharge: No SNF bed, Insurance Authorization  Expected Discharge Plan and Services Expected Discharge Plan: [place] In-house Referral: Clinical Social Work   Post Acute Care Choice: [place] Living arrangements for the past 2 months: Single Family Home Expected Discharge Date: 02/20/20                                     Social Determinants of Health (SDOH) Interventions    Readmission Risk Interventions No flowsheet data found.

## 2020-02-20 NOTE — Progress Notes (Signed)
Per [name], pt ok to stay another night d/t going home with out patient PT instead of SNF. Pt requesting another night to get things ready for her arrival home.

## 2020-02-20 NOTE — Progress Notes (Signed)
Patient ID: [name], female   DOB: 06-Apr-1951, 69 y.o.   MRN: [id number] No acute changes.  Left hip stable and vitals stable.  Dressing clean and dry.  Can be discharged to short-term skilled nursing today if bed available.

## 2020-02-20 NOTE — Progress Notes (Signed)
Physical Therapy Treatment Patient Details Name: LOLITTA [name] MRN: [id number] DOB: 1950-06-07 Today's Date: 02/20/2020    History of Present Illness s/p L DA THA. PMH: L TKA, obesity, heart murmur, PE, anemia, spine surgery    PT Comments    Continues to progress toward PT goals. Recommend SNF post acute.  Follow Up Recommendations  Follow surgeon's recommendation for DC plan and follow-up therapies;SNF     Equipment Recommendations  None recommended by PT    Recommendations for Other Services       Precautions / Restrictions Precautions Precautions: Fall Restrictions Weight Bearing Restrictions: No Other Position/Activity Restrictions: WBAT    Mobility  Bed Mobility Overal bed mobility: Needs Assistance Bed Mobility: Supine to Sit;Sit to Supine     Supine to sit: Min assist;HOB elevated Sit to supine: Min assist   General bed mobility comments: cues for technique and to self assist LLE with use of gait belt as leg lifter, continues to require min assist to bring LLE off and on to bed  Transfers Overall transfer level: Needs assistance Equipment used: Rolling walker (2 wheeled) Transfers: Sit to/from [employer] Sit to Stand: Min assist;Min guard         General transfer comment: cues for hand placement and safety. incr time, min/guard for safety with stand step pivot to [facility]  Ambulation/Gait Ambulation/Gait assistance: Min guard;Supervision Gait Distance (Feet): 40 Feet Assistive device: Rolling walker (2 wheeled) Gait Pattern/deviations: Step-to pattern;Decreased stance time - left;Wide base of support Gait velocity: decr   General Gait Details: cues for sequence,  and RW position and posture   Stairs             Wheelchair Mobility    Modified Rankin (Stroke Patients Only)       Balance           Standing balance support: Bilateral upper extremity supported Standing balance-Leahy Scale: Fair Standing balance  comment: able to stand and don mask without LOB                            Cognition Arousal/Alertness: Awake/alert Behavior During Therapy: WFL for tasks assessed/performed;Flat affect Overall Cognitive Status: Within Functional Limits for tasks assessed                                        Exercises Total Joint Exercises Ankle Circles/Pumps: AROM;15 reps;Both   General Comments        Pertinent Vitals/Pain Pain Assessment: 0-10 Pain Score: 5  Pain Location: left hip Pain Descriptors / Indicators: Grimacing;Sore Pain Intervention(s): Limited activity within patient's tolerance;Monitored during session;Ice applied;Patient requesting pain meds-RN notified    Home Living                      Prior Function            PT Goals (current goals can now be found in the care plan section) Acute Rehab PT Goals Patient Stated Goal: home after rehab PT Goal Formulation: With patient/family Time For Goal Achievement: 02/24/20 Potential to Achieve Goals: Good Progress towards PT goals: Progressing toward goals    Frequency    7X/week      PT Plan Current plan remains appropriate    Co-evaluation              AM-PAC PT "6  Clicks" Mobility   Outcome Measure  Help needed turning from your back to your side while in a flat bed without using bedrails?: A Little Help needed moving from lying on your back to sitting on the side of a flat bed without using bedrails?: A Little Help needed moving to and from a bed to a chair (including a wheelchair)?: A Little Help needed standing up from a chair using your arms (e.g., wheelchair or bedside chair)?: A Little Help needed to walk in hospital room?: A Little Help needed climbing 3-5 steps with a railing? : A Lot 6 Click Score: 17    End of Session Equipment Utilized During Treatment: Gait belt Activity Tolerance: Patient tolerated treatment well Patient left: with call bell/phone  within reach;in bed;with bed alarm set Nurse Communication: Mobility status PT Visit Diagnosis: Difficulty in walking, not elsewhere classified (R26.2)     Time: 7829-5621 PT Time Calculation (min) (ACUTE ONLY): 18 min  Charges:  $Gait Training: 8-22 mins $Therapeutic Exercise: 8-22 mins                     Delice Bison, PT  Acute Rehab Dept ([facility]) [phone] Pager [phone]  02/20/2020    [facility] 02/20/2020, 4:15 PM

## 2020-02-20 NOTE — Discharge Summary (Signed)
Patient ID: [name] MRN: [id number] DOB/AGE: 10-31-1950 69 y.o.  Admit date: 02/17/2020 Discharge date: 02/20/2020  Admission Diagnoses:  Principal Problem:   Unilateral primary osteoarthritis, left hip Active Problems:   Status post total replacement of left hip   Discharge Diagnoses:  Same  Past Medical History:  Diagnosis Date  . Abnormal thyroid function test   . Allergy   . Anemia    she attributes previously to fibroids, she declines  . Barrett's esophagus   . Blood transfusion without reported diagnosis   . DDD (degenerative disc disease), cervical   . DJD (degenerative joint disease)   . Dysrhythmia   . GERD (gastroesophageal reflux disease)   . Heart murmur   . History of bronchitis   . History of hiatal hernia   . Hx of adenomatous polyp of colon 03/01/2002  . Myocardial infarction (HCC)   . OA (osteoarthritis)   . Obese   . Paroxysmal atrial fibrillation (HCC)   . PE (pulmonary embolism) 12/21/2015  . Plantar fasciitis   . Pneumonia   . PONV (postoperative nausea and vomiting)    pt. reports that she woke up during surgery  . Pre-diabetes   . Pulmonary embolism (HCC)     Surgeries: Procedure(s): LEFT TOTAL HIP ARTHROPLASTY ANTERIOR APPROACH on 02/17/2020   Consultants:   Discharged Condition: Improved  Hospital Course: [name] is an 69 y.o. female who was admitted 02/17/2020 for operative treatment ofUnilateral primary osteoarthritis, left hip. Patient has severe unremitting pain that affects sleep, daily activities, and work/hobbies. After pre-op clearance the patient was taken to the operating room on 02/17/2020 and underwent  Procedure(s): LEFT TOTAL HIP ARTHROPLASTY ANTERIOR APPROACH.    Patient was given perioperative antibiotics:  Anti-infectives (From admission, onward)   Start     Dose/Rate Route Frequency Ordered Stop   02/17/20 1600  ceFAZolin (ANCEF) IVPB 2g/100 mL premix        2 g 200 mL/hr over 30 Minutes  Intravenous Every 6 hours 02/17/20 1222 02/17/20 2148   02/17/20 0600  ceFAZolin (ANCEF) 3 g in dextrose 5 % 50 mL IVPB        3 g 100 mL/hr over 30 Minutes Intravenous  Once 02/16/20 0757 02/17/20 1019       Patient was given sequential compression devices, early ambulation, and chemoprophylaxis to prevent DVT.  Patient benefited maximally from hospital stay and there were no complications.    Recent vital signs:  Patient Vitals for the past 24 hrs:  BP Temp Temp src Pulse Resp SpO2  02/20/20 0611 133/66 98 F (36.7 C) -- 82 18 94 %  02/19/20 2054 (!) 145/63 99 F (37.2 C) -- 94 18 96 %  02/19/20 1348 (!) 121/55 99 F (37.2 C) Oral 80 17 97 %     Recent laboratory studies:  Recent Labs    02/18/20 0314  WBC 12.2*  HGB 11.5*  HCT 35.2*  PLT 218  NA 138  K 4.3  CL 102  CO2 25  BUN 15  CREATININE 0.86  GLUCOSE 139*  CALCIUM 8.7*     Discharge Medications:   Allergies as of 02/20/2020      Reactions   Ciprofloxacin Anaphylaxis   Doxycycline Anaphylaxis   Strawberry Extract Anaphylaxis   Tetracyclines & Related Anaphylaxis      Medication List    STOP taking these medications   HYDROcodone-acetaminophen 5-325 MG tablet Commonly known as: NORCO/VICODIN     TAKE these medications  apixaban 5 MG Tabs tablet Commonly known as: ELIQUIS Take 1 tablet (5 mg total) by mouth 2 (two) times daily.   methocarbamol 500 MG tablet Commonly known as: ROBAXIN Take 1 tablet (500 mg total) by mouth every 6 (six) hours as needed for muscle spasms.   metoprolol tartrate 25 MG tablet Commonly known as: LOPRESSOR 1 PO BID, may increase to 2 PO BID if needed.   multivitamin with minerals Tabs tablet Take 1 tablet by mouth daily.   oxyCODONE 5 MG immediate release tablet Commonly known as: Oxy IR/ROXICODONE Take 1-2 tablets (5-10 mg total) by mouth every 4 (four) hours as needed for moderate pain (pain score 4-6).            Durable Medical Equipment  (From  admission, onward)         Start     Ordered   02/17/20 1337  DME 3 n 1  Once        02/17/20 1336   02/17/20 1337  DME Walker rolling  Once       Question Answer Comment  Walker: With 5 Inch Wheels   Patient needs a walker to treat with the following condition Status post total replacement of left hip      02/17/20 1336          Diagnostic Studies: DG Pelvis Portable  Result Date: 02/17/2020 CLINICAL DATA:  Status post left total hip replacement EXAM: PORTABLE PELVIS 1-2 VIEWS COMPARISON:  Intraoperative left hip images February 17, 2020; MR left hip region October 15, 2019 FINDINGS: Frontal view of mid to lower pelvis and hips obtained. There is a total hip replacement on the left with prosthetic components well-seated. No fracture or dislocation evident on frontal view. Unremarkable right hip joint on frontal view. IMPRESSION: Status post total hip replacement on the left with prosthetic components well-seated on frontal view. No fracture or dislocation evident. Electronically Signed   By: Bretta Bang III M.D.   On: 02/17/2020 14:08   DG C-Arm 1-60 Min-No Report  Result Date: 02/17/2020 Fluoroscopy was utilized by the requesting physician.  No radiographic interpretation.   DG HIP OPERATIVE UNILAT W OR W/O PELVIS LEFT  Result Date: 02/17/2020 CLINICAL DATA:  Left hip replacement EXAM: OPERATIVE left HIP (WITH PELVIS IF PERFORMED) 3.  VIEWS TECHNIQUE: Fluoroscopic spot image(s) were submitted for interpretation post-operatively. COMPARISON:  08/05/2019 FINDINGS: Left hip replacement in satisfactory position alignment. No fracture or complication IMPRESSION: Satisfactory left hip replacement. Electronically Signed   By: Marlan Palau M.D.   On: 02/17/2020 11:53    Disposition: Discharge disposition: 03-Skilled Nursing Facility          Follow-up Information    Kathryne Hitch, MD Follow up in 2 week(s).   Specialty: Orthopedic Surgery Contact  information: [address] [place] [address] [phone]                Signed: Kathryne Hitch 02/20/2020, 7:29 AM

## 2020-02-21 ENCOUNTER — Ambulatory Visit: Payer: PRIVATE HEALTH INSURANCE | Admitting: Internal Medicine

## 2020-02-21 ENCOUNTER — Other Ambulatory Visit: Payer: Self-pay

## 2020-02-21 ENCOUNTER — Encounter: Payer: Self-pay | Admitting: Orthopaedic Surgery

## 2020-02-21 ENCOUNTER — Encounter (HOSPITAL_COMMUNITY): Payer: Self-pay | Admitting: Orthopaedic Surgery

## 2020-02-21 DIAGNOSIS — Z96642 Presence of left artificial hip joint: Secondary | ICD-10-CM

## 2020-02-21 MED ORDER — OXYCODONE HCL 5 MG PO TABS: 5.0000 mg | ORAL_TABLET | ORAL | 0 refills | Status: DC | PRN

## 2020-02-21 MED ORDER — METHOCARBAMOL 500 MG PO TABS
500.0000 mg | ORAL_TABLET | Freq: Four times a day (QID) | ORAL | 0 refills | Status: DC | PRN
Start: 2020-02-21 — End: 2020-10-29

## 2020-02-21 NOTE — Plan of Care (Signed)
  Problem: Education: Goal: Knowledge of the prescribed therapeutic regimen will improve Outcome: Progressing Goal: Understanding of discharge needs will improve Outcome: Progressing   Problem: Activity: Goal: Ability to avoid complications of mobility impairment will improve Outcome: Progressing Goal: Ability to tolerate increased activity will improve Outcome: Progressing   Problem: Clinical Measurements: Goal: Postoperative complications will be avoided or minimized Outcome: Progressing   Problem: Pain Management: Goal: Pain level will decrease with appropriate interventions Outcome: Progressing   Problem: Skin Integrity: Goal: Will show signs of wound healing Outcome: Progressing   Problem: Education: Goal: Knowledge of General Education information will improve Description: Including pain rating scale, medication(s)/side effects and non-pharmacologic comfort measures Outcome: Progressing   Problem: Health Behavior/Discharge Planning: Goal: Ability to manage health-related needs will improve Outcome: Progressing   Problem: Clinical Measurements: Goal: Ability to maintain clinical measurements within normal limits will improve Outcome: Progressing Goal: Will remain free from infection Outcome: Progressing Goal: Diagnostic test results will improve Outcome: Progressing Goal: Respiratory complications will improve Outcome: Progressing   Problem: Activity: Goal: Risk for activity intolerance will decrease Outcome: Progressing   Problem: Elimination: Goal: Will not experience complications related to bowel motility Outcome: Progressing   Problem: Pain Managment: Goal: General experience of comfort will improve Outcome: Progressing   Problem: Safety: Goal: Ability to remain free from injury will improve Outcome: Progressing   Problem: Skin Integrity: Goal: Risk for impaired skin integrity will decrease Outcome: Progressing

## 2020-02-21 NOTE — Progress Notes (Signed)
Pt Physical Therapy Treatment Patient Details Name: [name] MRN: [id number] DOB: [birth year] Today's Date: 02/21/2020    History of Present Illness s/p L DA THA. PMH: L TKA, obesity, heart murmur, PE, anemia, spine surgery    PT Comments    POD # 4 am session Assisted OOB.  General bed mobility comments: demonstrated and instructed on how to use belt to self assist LE off bed.  General transfer comment: 25% VC's on proper tech and safety with turns pt was self able.  General Gait Details: cues for sequence,  and RW position and posture.  General stair comments: first practiced 2 steps with 2 rails and one step with walker. Then returned to room to perform some TE's following HEP handout.  Instructed on proper tech, freq as well as use of ICE.   Addressed all mobility questions, discussed appropriate activity, educated on use of ICE.  Pt ready for D/C to home. Pt ready for D/C to home after one PT session today.   Follow Up Recommendations  Follow surgeon's recommendation for DC plan and follow-up therapies;SNF     Equipment Recommendations  None recommended by PT    Recommendations for Other Services       Precautions / Restrictions Precautions Precautions: Fall    Mobility  Bed Mobility Overal bed mobility: Needs Assistance Bed Mobility: Supine to Sit     Supine to sit: Min guard     General bed mobility comments: demonstrated and instructed on how to use belt to self assist LE off bed  Transfers Overall transfer level: Needs assistance Equipment used: Rolling walker (2 wheeled) Transfers: Sit to/from [employer] Sit to Stand: Supervision Stand pivot transfers: Supervision;Min guard       General transfer comment: 25% VC's on proper tech and safety with turns pt was self able  Ambulation/Gait Ambulation/Gait assistance: Min guard;Supervision Gait Distance (Feet): 45 Feet Assistive device: Rolling walker (2 wheeled) Gait  Pattern/deviations: Step-to pattern;Decreased stance time - left;Wide base of support Gait velocity: decr   General Gait Details: cues for sequence,  and RW position and posture   Stairs Stairs: Yes Stairs assistance: Min guard Stair Management: Two rails;Forwards Number of Stairs: 5 General stair comments: first practiced 2 steps with 2 rails and one step with walker.   Wheelchair Mobility    Modified Rankin (Stroke Patients Only)       Balance                                            Cognition   Behavior During Therapy: WFL for tasks assessed/performed;Flat affect Overall Cognitive Status: Within Functional Limits for tasks assessed                                 General Comments: AxO x3 very apprehensive about goinf home (crying)      Exercises   Total Hip Replacement TE's following HEP Handout 10 reps ankle pumps 05 reps knee presses 05 reps heel slides 05 reps SAQ's 05 reps ABD Instructed how to use a belt loop to assist  Followed by ICE     General Comments        Pertinent Vitals/Pain Pain Assessment: 0-10 Pain Score: 5  Pain Location: left hip Pain Descriptors / Indicators: Grimacing;Sore;Crying Pain Intervention(s): Monitored during session;Premedicated  before session;Repositioned;Ice applied    Home Living                      Prior Function            PT Goals (current goals can now be found in the care plan section) Progress towards PT goals: Progressing toward goals    Frequency    7X/week      PT Plan Current plan remains appropriate    Co-evaluation              AM-PAC PT "6 Clicks" Mobility   Outcome Measure  Help needed turning from your back to your side while in a flat bed without using bedrails?: A Little Help needed moving from lying on your back to sitting on the side of a flat bed without using bedrails?: A Little Help needed moving to and from a bed to a chair  (including a wheelchair)?: A Little Help needed standing up from a chair using your arms (e.g., wheelchair or bedside chair)?: A Little Help needed to walk in hospital room?: A Little Help needed climbing 3-5 steps with a railing? : A Little 6 Click Score: 18    End of Session Equipment Utilized During Treatment: Gait belt Activity Tolerance: Patient tolerated treatment well Patient left: with call bell/phone within reach;in bed;with bed alarm set Nurse Communication: Mobility status PT Visit Diagnosis: Difficulty in walking, not elsewhere classified (R26.2)     Time: 1610-9604 PT Time Calculation (min) (ACUTE ONLY): 30 min  Charges:  $Gait Training: 8-22 mins $Therapeutic Exercise: 8-22 mins                     Felecia Shelling  PTA Acute  Rehabilitation Services Pager      [phone] Office      [phone]

## 2020-02-21 NOTE — Progress Notes (Signed)
Patient ID: [name], female   DOB: 10/08/1950, 69 y.o.   MRN: [id number] The patient is awake and alert this morning with stable vital signs.  Her left operative hip is also stable.  I was contacted by nursing yesterday stating that the patient wanted to now go home.  Apparently there was not a skilled nursing facility bed available at where she helped to go.  She expressed a desire to go home instead.  At the bedside this morning, she states that she wants to stay 1 more day said therapy can help with her walking.  She also stated that social work was going to look for a facility in [place] potentially for her.  I am uncertain as to the patient's to disposition and I am hoping nursing and the transitional care team can help clarify so I can discharge the patient to the appropriate location.

## 2020-02-21 NOTE — Plan of Care (Signed)
Plan of care reviewed and discussed with the patient. 

## 2020-02-21 NOTE — TOC Transition Note (Addendum)
Transition of Care [facility]) - CM/SW Discharge Note   Patient Details  Name: [name] MRN: [id number] Date of Birth: 11/18/1950  [facility] CM/SW Contact:  Lia Hopping, [place] Phone Number: 02/21/2020, 9:29 AM   Clinical Narrative:    CSW explain to the patient again, there are no SNF bed offers in the [place] area. Patient only SNF option in the area is [facility]. Patient declines bed due to the facility 1 star rating and inability to private pay 40% of the SNF stay.Patient declines SNF's in network with [employer] insurance because of the long distance.  Patient request to go home with OPPT services preferably [facility] where she has rehab in the past. Charge nurse to notify the physician.   Patient has a RW. 3 in1 delivered to the patient bedside by [employer]. Patient reports her family will transport her home.  No other needs identified. CSW signing off.    Addendum: 4:00pm 3 in1 provided to the patient by [employer]/Form completed.    Final next level of care: OP Rehab Barriers to Discharge: Barriers Resolved   Patient Goals and CMS Choice Patient states their goals for this hospitalization and ongoing recovery are:: Patient states she woudl like to go to a SNF, "like [facility], because my husband cannot take care of me." CMS Medicare.gov Compare Post Acute Care list provided to:: Patient Choice offered to / list presented to : Patient  Discharge Placement                       Discharge Plan and Services In-house Referral: Clinical Social Work   Post Acute Care Choice: [place]            DME Agency: [employer] Date DME Agency Contacted: 02/21/20 Time DME Agency Contacted: [phone] Representative spoke with at DME Agency: [place] ([place]) Interventions     Readmission Risk Interventions No flowsheet data found.

## 2020-02-22 ENCOUNTER — Telehealth: Payer: Self-pay

## 2020-02-22 ENCOUNTER — Encounter: Payer: Self-pay | Admitting: Orthopaedic Surgery

## 2020-02-22 NOTE — Telephone Encounter (Signed)
Changed order to PT [address]

## 2020-02-22 NOTE — Telephone Encounter (Signed)
Can we change her PT location to [address] please?

## 2020-02-25 ENCOUNTER — Other Ambulatory Visit: Payer: Self-pay | Admitting: Family Medicine

## 2020-02-29 ENCOUNTER — Encounter (HOSPITAL_COMMUNITY): Payer: Self-pay | Admitting: Orthopaedic Surgery

## 2020-03-01 ENCOUNTER — Ambulatory Visit (INDEPENDENT_AMBULATORY_CARE_PROVIDER_SITE_OTHER): Payer: PRIVATE HEALTH INSURANCE | Admitting: Orthopaedic Surgery

## 2020-03-01 ENCOUNTER — Encounter: Payer: Self-pay | Admitting: Orthopaedic Surgery

## 2020-03-01 ENCOUNTER — Telehealth: Payer: Self-pay | Admitting: Orthopaedic Surgery

## 2020-03-01 DIAGNOSIS — Z96642 Presence of left artificial hip joint: Secondary | ICD-10-CM

## 2020-03-01 MED ORDER — OXYCODONE HCL 5 MG PO TABS
5.0000 mg | ORAL_TABLET | ORAL | 0 refills | Status: DC | PRN
Start: 2020-03-01 — End: 2020-04-05

## 2020-03-01 NOTE — Telephone Encounter (Signed)
Patient submitted medical release form, and check in the amount of $25.00 for [employer]. [employer] disability paperwork was faxed to [place].  [place] and medical release received 03/01/2020

## 2020-03-01 NOTE — Progress Notes (Signed)
The patient comes in today 2 weeks tomorrow status post a left total hip arthroplasty.  She states that her pain is worse than when she had back surgery.  Examination of her hip incision actually looks good.  She does have a moderate seroma.  She is on Eliquis.  I was able to aspirate about 80 cc of fluid from around her hip.  Her leg lengths are equal.  She was able to get up and down on her own but she is having pain.  She is morbidly obese which does contribute to her lack of mobility.  I will send in some more oxycodone for her.  We put Aquacel dressing over her incision.  I would like to see her back in 1 week for suture removal.

## 2020-03-08 ENCOUNTER — Ambulatory Visit (INDEPENDENT_AMBULATORY_CARE_PROVIDER_SITE_OTHER): Payer: PRIVATE HEALTH INSURANCE | Admitting: Physician Assistant

## 2020-03-08 ENCOUNTER — Encounter: Payer: Self-pay | Admitting: Physician Assistant

## 2020-03-08 DIAGNOSIS — Z96642 Presence of left artificial hip joint: Secondary | ICD-10-CM

## 2020-03-08 NOTE — Progress Notes (Signed)
HPI: [name] returns today status post left total hip arthroplasty 02/17/2020.  She states that she feels she has a recurrent fluid accumulation in the left hip.  She is here for wound incision check and removal of suture from the wound.  Left hip: Slight seroma.  62 cc of serous sanguinous fluid is aspirated today patient tolerates well.  Sutures removed Steri-Strips applied.    Impression: Status post left total hip arthroplasty 02/17/2020.  Plan: She is to wash the wound with an antibacterial soap daily.  We will see her back in 1 month sooner if there is any questions concerns.  Patient was able to ambulate on her home about the room using a walker.

## 2020-03-12 ENCOUNTER — Other Ambulatory Visit: Payer: Self-pay

## 2020-03-12 ENCOUNTER — Ambulatory Visit: Payer: PRIVATE HEALTH INSURANCE | Attending: Orthopaedic Surgery

## 2020-03-12 DIAGNOSIS — M6281 Muscle weakness (generalized): Secondary | ICD-10-CM

## 2020-03-12 DIAGNOSIS — R262 Difficulty in walking, not elsewhere classified: Secondary | ICD-10-CM | POA: Diagnosis present

## 2020-03-12 DIAGNOSIS — M25552 Pain in left hip: Secondary | ICD-10-CM

## 2020-03-12 DIAGNOSIS — R2689 Other abnormalities of gait and mobility: Secondary | ICD-10-CM | POA: Diagnosis present

## 2020-03-12 DIAGNOSIS — Z96642 Presence of left artificial hip joint: Secondary | ICD-10-CM | POA: Diagnosis present

## 2020-03-12 NOTE — Therapy (Signed)
[facility] [address] [place], [place], [address] Phone: [phone]   Fax:  [phone]  Physical Therapy Evaluation  Patient Details  Name: [name] MRN: [id number] Date of Birth: 01-Dec-1950 Referring Provider (PT): Doneen Poisson, MD   Encounter Date: 03/12/2020   PT End of Session - 03/12/20 1245    Visit Number 1    Number of Visits 24    Date for PT Re-Evaluation 06/01/20    Authorization Type [employer]    PT Start Time 1215    PT Stop Time 1255    PT Time Calculation (min) 40 min    Activity Tolerance Patient limited by pain    Behavior During Therapy [facility] for tasks assessed/performed           Past Medical History:  Diagnosis Date  . Abnormal thyroid function test   . Allergy   . Anemia    she attributes previously to fibroids, she declines  . Barrett's esophagus   . Blood transfusion without reported diagnosis   . DDD (degenerative disc disease), cervical   . DJD (degenerative joint disease)   . Dysrhythmia   . GERD (gastroesophageal reflux disease)   . Heart murmur   . History of bronchitis   . History of hiatal hernia   . Hx of adenomatous polyp of colon 03/01/2002  . Myocardial infarction (HCC)   . OA (osteoarthritis)   . Obese   . Paroxysmal atrial fibrillation (HCC)   . PE (pulmonary embolism) 12/21/2015  . Plantar fasciitis   . Pneumonia   . PONV (postoperative nausea and vomiting)    pt. reports that she woke up during surgery  . Pre-diabetes   . Pulmonary embolism [facility])     Past Surgical History:  Procedure Laterality Date  . ABDOMINAL HYSTERECTOMY    . Arthroscopic surgery left knee    . BACK SURGERY     2005 ,  2017  . BIOPSY  07/26/2019   Procedure: BIOPSY;  Surgeon: Iva Boop, MD;  Location: [facility];  Service: Endoscopy;;  . CESAREAN SECTION    . CHOLECYSTECTOMY    . COLONOSCOPY  2003, 2011   3 mm adenoma 2011  . COLONOSCOPY WITH PROPOFOL N/A 07/26/2019    Procedure: COLONOSCOPY WITH PROPOFOL;  Surgeon: Iva Boop, MD;  [facility];  Service: Endoscopy;  Laterality: N/A;  . ESOPHAGOGASTRODUODENOSCOPY  multiple since 2003  . ESOPHAGOGASTRODUODENOSCOPY (EGD) WITH PROPOFOL N/A 07/26/2019   Procedure: ESOPHAGOGASTRODUODENOSCOPY (EGD) WITH PROPOFOL;  Surgeon: Iva Boop, MD;  [facility];  Service: Endoscopy;  Laterality: N/A;  . IR GENERIC HISTORICAL  12/19/2015   IR ANGIOGRAM SELECTIVE EACH ADDITIONAL VESSEL 12/19/2015 [name], MD [facility]  . IR GENERIC HISTORICAL  12/19/2015   IR ANGIOGRAM PULMONARY BILATERAL SELECTIVE 12/19/2015 [name], MD [facility]  . IR GENERIC HISTORICAL  12/19/2015   IR INFUSION THROMBOL ARTERIAL INITIAL (MS) 12/19/2015 [name], MD [facility]  . IR GENERIC HISTORICAL  12/19/2015   IR US GUIDE VASC ACCESS RIGHT 12/19/2015 [name], MD [facility]  . IR GENERIC HISTORICAL  12/19/2015   IR ANGIOGRAM SELECTIVE EACH ADDITIONAL VESSEL 12/19/2015 [name], MD [facility]  . IR GENERIC HISTORICAL  12/20/2015   IR THROMB F/U EVAL ART/VEN FINAL DAY (MS) 12/20/2015 Gilmer Mor, DO [facility]  . IR GENERIC HISTORICAL  12/19/2015   IR INFUSION THROMBOL ARTERIAL INITIAL (MS) 12/19/2015 [name], MD [facility]  . PLANTAR FASCIA RELEASE  right  . POLYPECTOMY  07/26/2019   Procedure: POLYPECTOMY;  Surgeon: Iva Boop, MD;  [facility];  Service: Endoscopy;;  . Right knee replacement    . SPINAL FUSION  10/31/2015   revision at [facility]   . TONSILLECTOMY    . TOTAL HIP ARTHROPLASTY Left 02/17/2020   Procedure: LEFT TOTAL HIP ARTHROPLASTY ANTERIOR APPROACH;  Surgeon: Kathryne Hitch, MD;  Location: [facility];  Service: Orthopedics;  Laterality: Left;  . TOTAL KNEE ARTHROPLASTY Left 10/13/2017   Procedure: LEFT TOTAL KNEE ARTHROPLASTY;  Surgeon: Kathryne Hitch, MD;  [facility];  Service: Orthopedics;  Laterality: Left;   . ULNAR TUNNEL RELEASE     right  . VENOUS ABLATION     x 2    There were no vitals filed for this visit.    Subjective Assessment - 03/12/20 1221    Subjective She reports post THA Lt . She has had pain and fluid that has been removed at last MD visit.      She walks with RW and SPC now.    She reports doing the HEP issued from hospital  .    Limitations Walking;Standing   normal tioleting and etting out of bed   How long can you sit comfortably? 15 min    How long can you stand comfortably? none    How long can you walk comfortably? She only walks about 100 feet..   Prior to SX varied by day.    Patient Stated Goals Return to some  form of normalicity. She returns to work i/2022 and sits for 8 hours    Currently in Pain? Yes    Pain Score 7     Pain Location Hip    Pain Orientation Left;Lateral    Pain Descriptors / Indicators Aching;Throbbing;Tightness    Pain Type Surgical pain    Pain Onset More than a month ago    Pain Frequency Constant    Aggravating Factors  all positions and movement    Pain Relieving Factors medication , moving around short time frame.              [facility] PT Assessment - 03/12/20 0001      Assessment   Medical Diagnosis Left THA    Referring Provider (PT) Doneen Poisson, MD    Onset Date/Surgical Date 02/17/20    Hand Dominance Right    Next MD Visit 04/05/20    Prior Therapy None since hospital. Had OPPT earlier this year      Precautions   Precautions None      Restrictions   Weight Bearing Restrictions No      Balance Screen   Has the patient fallen in the past 6 months No      Home Environment   Living Environment Private residence    Living Arrangements Spouse/significant other    Available Help at Discharge Family    Type of Home House    Home Access Stairs to enter    Entrance Stairs-Number of Steps 5    Entrance Stairs-Rails Right;Left;Cannot reach both    Home Layout One level    Home Equipment [place] - single  point;Walker - 2 wheels;Walker - standard      Prior Function   Vocation Requirements sitting 8 hours daily    Leisure Gardening, reading       Observation/Other Assessments   Focus on Therapeutic Outcomes (FOTO)  33%  improve to 58%  Strength   Left Hip Flexion 2+/5    Left Hip Extension 3+/5    Left Hip ABduction 2/5    Left Hip ADduction 2+/5    Left Knee Flexion 5/5    Left Knee Extension 4+/5    Right Ankle Dorsiflexion 5/5    Left Ankle Dorsiflexion 5/5      Flexibility   Quadriceps chronic knee arthrofibrosis prohbiits formal testing      Ambulation/Gait   Ambulation/Gait Yes    Ambulation/Gait Assistance 6: Modified independent (Device/Increase time)    Ambulation Distance (Feet) 170 Feet    Assistive device Straight cane    Gait Pattern Step-through pattern    Gait velocity 1.72ft/sec    Gait Comments decr weight to LT       Standardized Balance Assessment   Standardized Balance Assessment Five Times Sit to Stand    Five times sit to stand comments  40 sec and used hands for 5th rep                      Objective measurements completed on examination: See above findings.       [facility] Adult PT Treatment/Exercise - 03/12/20 0001      Neuro Re-ed    Neuro Re-ed Details  standing with weight shift to Lt x 10, SPC for support.       Exercises   Exercises Knee/Hip      Knee/Hip Exercises: Supine   Quad Sets Left;10 reps    Heel Slides Left;5 reps    Straight Leg Raises Left;AAROM;5 reps    Other Supine Knee/Hip Exercises AAROM hp adduction /abduction x 10                  PT Education - 03/12/20 1309    Education Details POC , FOTO score and probable progressive improvement. , weight shift to Lt 3x/ per day    Person(s) Educated Patient    Methods Explanation;Verbal cues    Comprehension Verbalized understanding;Returned demonstration            PT Short Term Goals - 03/12/20 1249      PT SHORT TERM GOAL #1   Title After  4 weeks pt will demonstrate HEP compliance for symptoms management at home.    Time 4    Period Weeks    Status New      PT SHORT TERM GOAL #2   Title She will be able to walk 300 feet or more with min to mod pain safely with SPC    Time 4    Period Weeks    Status New      PT SHORT TERM GOAL #3   Title FOTO score will improve to    40 %    Time 4    Period Weeks    Status New      PT SHORT TERM GOAL #4   Title FOTO dicussed 1 visit after eval ,    Baseline discussed at eval    Time 1    Period Weeks    Status Achieved      PT SHORT TERM GOAL #5   Title Walking speed will increase to > 2 feeet per second.    Time 4    Period Weeks    Status New             PT Long Term Goals - 03/12/20 1315      PT LONG TERM GOAL #1  Title She will be indpendent with all hEP issued    Time 12    Period Weeks    Status New      PT LONG TERM GOAL #2   Title FOTO score improved to 58%    Time 12    Period Weeks    Status New      PT LONG TERM GOAL #3   Title She will be able to walk in home with no device safely and return to normal home tasks with pain <3/10    Time 12    Period Weeks    Status New      PT LONG TERM GOAL #4   Title She will be able to sit for 2 hours at a time due to decr pain for work.    Time 12    Period Weeks    Status New      PT LONG TERM GOAL #5   Title She will be able to stand for 15 min withoutLT hip pain stopping    Time 12    Period Weeks    Status New      PT LONG TERM GOAL #6   Title She will be able to trasfer from lying with min pain and effort.    Time 12    Period Weeks    Status New      PT LONG TERM GOAL #7   Title she will be able to sit to stand x5 in under 25 sec    Time 12    Period Weeks    Status New                  Plan - 03/12/20 1246    Clinical Impression Statement Ms Arrendondo presents post LT THA with Lt hip pain , weakness,  decreased walking and mobility and decreased tolerance to positions.  Also LBP and LT knee pain impact progress.   She will progress with skilled PT and consistent exercises to full independence.    Personal Factors and Comorbidities Age;Behavior Pattern;Comorbidity 1;Fitness;Past/Current Experience;Time since onset of injury/illness/exacerbation    Comorbidities Hx of back surgery    Examination-Activity Limitations Bathing;Bed Mobility;Squat;Stairs;Bend;Locomotion Level;Reach Overhead;Toileting;Stand;Carry;Transfers    Examination-Participation Restrictions Other;Community Activity;Interpersonal Relationship;Shop;Occupation    Stability/Clinical Decision Making Evolving/Moderate complexity    Clinical Decision Making Moderate    Rehab Potential Good    PT Frequency 2x / week    PT Duration 12 weeks    PT Treatment/Interventions ADLs/Self Care Home Management;Electrical Stimulation;Gait training;Stair training;Functional mobility training;Therapeutic activities;Therapeutic exercise;Balance training;Neuromuscular re-education;Cognitive remediation;Patient/family education;Passive range of motion    PT Next Visit Plan Establish HEP for pain management  and exercises. Manual ROM   Gait    PT Home Exercise Plan QS, abduction slides, heel slides,   weight shift to LT    Consulted and Agree with Plan of Care Patient           Patient will benefit from skilled therapeutic intervention in order to improve the following deficits and impairments:  Decreased balance, Abnormal gait, Decreased mobility, Decreased activity tolerance, Decreased range of motion, Difficulty walking, Obesity, Pain, Decreased strength  Visit Diagnosis: Pain in left hip - Plan: PT plan of care cert/re-cert  Other abnormalities of gait and mobility - Plan: PT plan of care cert/re-cert  History of total left hip replacement - Plan: PT plan of care cert/re-cert  Difficulty in walking, not elsewhere classified - Plan: PT plan of care cert/re-cert  Muscle weakness (  generalized) - Plan: PT plan  of care cert/re-cert     Problem List Patient Active Problem List   Diagnosis Date Noted  . Status post total replacement of left hip 02/17/2020  . Unilateral primary osteoarthritis, left hip 02/16/2020  . Diarrhea   . B12 deficiency 01/08/2018  . Vitamin D deficiency 01/08/2018  . History of pulmonary embolism 01/08/2018  . Status post total left knee replacement 10/13/2017  . Family history of thyroid disease 08/25/2017  . Chronic pain of left knee 05/20/2017  . Unilateral primary osteoarthritis, left knee 05/20/2017  . Morbid obesity (HCC) 04/30/2017  . Family history of ovarian cancer 02/17/2017  . IBS (irritable bowel syndrome) 01/21/2017  . Dyspnea 10/08/2016  . Paroxysmal atrial fibrillation (HCC) 06/18/2016  . Type 2 diabetes mellitus without complication, without long-term current use of insulin (HCC) 01/21/2016  . H/O Spinal surgery 12/21/2015  . Barrett's esophagus 12/21/2015  . Hx of adenomatous polyp of colon 03/01/2002    Caprice Red  PT 03/12/2020, 1:27 PM  [facility] [address] [place], [place], [address] Phone: [phone]   Fax:  [phone]  Name: [name] MRN: [id number] Date of Birth: [birth year]

## 2020-03-25 ENCOUNTER — Other Ambulatory Visit: Payer: Self-pay | Admitting: Family Medicine

## 2020-03-29 ENCOUNTER — Ambulatory Visit: Payer: PRIVATE HEALTH INSURANCE | Attending: Orthopaedic Surgery

## 2020-03-29 ENCOUNTER — Other Ambulatory Visit: Payer: Self-pay

## 2020-03-29 DIAGNOSIS — M25552 Pain in left hip: Secondary | ICD-10-CM | POA: Diagnosis present

## 2020-03-29 DIAGNOSIS — R262 Difficulty in walking, not elsewhere classified: Secondary | ICD-10-CM | POA: Insufficient documentation

## 2020-03-29 DIAGNOSIS — R2689 Other abnormalities of gait and mobility: Secondary | ICD-10-CM | POA: Insufficient documentation

## 2020-03-29 DIAGNOSIS — Z96642 Presence of left artificial hip joint: Secondary | ICD-10-CM | POA: Insufficient documentation

## 2020-03-29 DIAGNOSIS — M6281 Muscle weakness (generalized): Secondary | ICD-10-CM | POA: Insufficient documentation

## 2020-03-29 NOTE — Therapy (Signed)
[facility] [place], [place], [address] Phone: [phone]   Fax:  [phone]  Physical Therapy Treatment  Patient Details  Name: [name] MRN: [id number] Date of Birth: [birth year] Referring Provider (PT): Jean Rosenthal, MD   Encounter Date: 03/29/2020   PT End of Session - 03/29/20 1441    Visit Number 2    Number of Visits 24    Date for PT Re-Evaluation 06/01/20    Authorization Type [employer]    PT Start Time 0243    PT Stop Time 0330    PT Time Calculation (min) 47 min    Activity Tolerance Patient limited by pain    Behavior During Therapy [facility] for tasks assessed/performed           Past Medical History:  Diagnosis Date  . Abnormal thyroid function test   . Allergy   . Anemia    she attributes previously to fibroids, she declines  . Barrett's esophagus   . Blood transfusion without reported diagnosis   . DDD (degenerative disc disease), cervical   . DJD (degenerative joint disease)   . Dysrhythmia   . GERD (gastroesophageal reflux disease)   . Heart murmur   . History of bronchitis   . History of hiatal hernia   . Hx of adenomatous polyp of colon 03/01/2002  . Myocardial infarction ([place])   . OA (osteoarthritis)   . Obese   . Paroxysmal atrial fibrillation (HCC)   . PE (pulmonary embolism) 12/21/2015  . Plantar fasciitis   . Pneumonia   . PONV (postoperative nausea and vomiting)    pt. reports that she woke up during surgery  . Pre-diabetes   . Pulmonary embolism [facility])     Past Surgical History:  Procedure Laterality Date  . ABDOMINAL HYSTERECTOMY    . Arthroscopic surgery left knee    . BACK SURGERY     2005 ,  2017  . BIOPSY  07/26/2019   Procedure: BIOPSY;  Surgeon: Gatha Mayer, MD;  Location: [facility];  Service: Endoscopy;;  . CESAREAN SECTION    . CHOLECYSTECTOMY    . COLONOSCOPY  2003, 2011   3 mm adenoma 2011  . COLONOSCOPY WITH PROPOFOL N/A 07/26/2019    Procedure: COLONOSCOPY WITH PROPOFOL;  Surgeon: Gatha Mayer, MD;  Location: [facility];  Service: Endoscopy;  Laterality: N/A;  . ESOPHAGOGASTRODUODENOSCOPY  multiple since 2003  . ESOPHAGOGASTRODUODENOSCOPY (EGD) WITH PROPOFOL N/A 07/26/2019   Procedure: ESOPHAGOGASTRODUODENOSCOPY (EGD) WITH PROPOFOL;  Surgeon: Gatha Mayer, MD;  Location: [facility];  Service: Endoscopy;  Laterality: N/A;  . IR GENERIC HISTORICAL  12/19/2015   IR ANGIOGRAM SELECTIVE EACH ADDITIONAL VESSEL 12/19/2015 Greggory Keen, MD [facility]  . IR GENERIC HISTORICAL  12/19/2015   IR ANGIOGRAM PULMONARY BILATERAL SELECTIVE 12/19/2015 Greggory Keen, MD [facility]  . IR GENERIC HISTORICAL  12/19/2015   IR INFUSION THROMBOL ARTERIAL INITIAL (MS) 12/19/2015 Greggory Keen, MD [facility]  . IR GENERIC HISTORICAL  12/19/2015   IR US GUIDE VASC ACCESS RIGHT 12/19/2015 Greggory Keen, MD [facility]  . IR GENERIC HISTORICAL  12/19/2015   IR ANGIOGRAM SELECTIVE EACH ADDITIONAL VESSEL 12/19/2015 Greggory Keen, MD [facility]  . IR GENERIC HISTORICAL  12/20/2015   IR THROMB F/U EVAL ART/VEN FINAL DAY (MS) 12/20/2015 Corrie Mckusick, DO [facility]  . IR GENERIC HISTORICAL  12/19/2015   IR INFUSION THROMBOL ARTERIAL INITIAL (MS) 12/19/2015 Greggory Keen, MD [facility]  . PLANTAR FASCIA RELEASE  [facility] [place], [place], [address] Phone: [phone]   Fax:  [phone]  Physical Therapy Treatment  Patient Details  Name: [name] MRN: [id number] Date of Birth: [birth year] Referring Provider (PT): Jean Rosenthal, MD   Encounter Date: 03/29/2020   PT End of Session - 03/29/20 1441    Visit Number 2    Number of Visits 24    Date for PT Re-Evaluation 06/01/20    Authorization Type [employer]    PT Start Time 0243    PT Stop Time 0330    PT Time Calculation (min) 47 min    Activity Tolerance Patient limited by pain    Behavior During Therapy [facility] for tasks assessed/performed           Past Medical History:  Diagnosis Date  . Abnormal thyroid function test   . Allergy   . Anemia    she attributes previously to fibroids, she declines  . Barrett's esophagus   . Blood transfusion without reported diagnosis   . DDD (degenerative disc disease), cervical   . DJD (degenerative joint disease)   . Dysrhythmia   . GERD (gastroesophageal reflux disease)   . Heart murmur   . History of bronchitis   . History of hiatal hernia   . Hx of adenomatous polyp of colon 03/01/2002  . Myocardial infarction ([place])   . OA (osteoarthritis)   . Obese   . Paroxysmal atrial fibrillation (HCC)   . PE (pulmonary embolism) 12/21/2015  . Plantar fasciitis   . Pneumonia   . PONV (postoperative nausea and vomiting)    pt. reports that she woke up during surgery  . Pre-diabetes   . Pulmonary embolism [facility])     Past Surgical History:  Procedure Laterality Date  . ABDOMINAL HYSTERECTOMY    . Arthroscopic surgery left knee    . BACK SURGERY     2005 ,  2017  . BIOPSY  07/26/2019   Procedure: BIOPSY;  Surgeon: Gatha Mayer, MD;  Location: [facility];  Service: Endoscopy;;  . CESAREAN SECTION    . CHOLECYSTECTOMY    . COLONOSCOPY  2003, 2011   3 mm adenoma 2011  . COLONOSCOPY WITH PROPOFOL N/A 07/26/2019    Procedure: COLONOSCOPY WITH PROPOFOL;  Surgeon: Gatha Mayer, MD;  Location: [facility];  Service: Endoscopy;  Laterality: N/A;  . ESOPHAGOGASTRODUODENOSCOPY  multiple since 2003  . ESOPHAGOGASTRODUODENOSCOPY (EGD) WITH PROPOFOL N/A 07/26/2019   Procedure: ESOPHAGOGASTRODUODENOSCOPY (EGD) WITH PROPOFOL;  Surgeon: Gatha Mayer, MD;  Location: [facility];  Service: Endoscopy;  Laterality: N/A;  . IR GENERIC HISTORICAL  12/19/2015   IR ANGIOGRAM SELECTIVE EACH ADDITIONAL VESSEL 12/19/2015 Greggory Keen, MD [facility]  . IR GENERIC HISTORICAL  12/19/2015   IR ANGIOGRAM PULMONARY BILATERAL SELECTIVE 12/19/2015 Greggory Keen, MD [facility]  . IR GENERIC HISTORICAL  12/19/2015   IR INFUSION THROMBOL ARTERIAL INITIAL (MS) 12/19/2015 Greggory Keen, MD [facility]  . IR GENERIC HISTORICAL  12/19/2015   IR US GUIDE VASC ACCESS RIGHT 12/19/2015 Greggory Keen, MD [facility]  . IR GENERIC HISTORICAL  12/19/2015   IR ANGIOGRAM SELECTIVE EACH ADDITIONAL VESSEL 12/19/2015 Greggory Keen, MD [facility]  . IR GENERIC HISTORICAL  12/20/2015   IR THROMB F/U EVAL ART/VEN FINAL DAY (MS) 12/20/2015 Corrie Mckusick, DO [facility]  . IR GENERIC HISTORICAL  12/19/2015   IR INFUSION THROMBOL ARTERIAL INITIAL (MS) 12/19/2015 Greggory Keen, MD [facility]  . PLANTAR FASCIA RELEASE  Problem List Patient Active Problem List   Diagnosis Date Noted  . Status post total replacement of left hip 02/17/2020  . Unilateral primary osteoarthritis, left hip 02/16/2020  . Diarrhea   . B12 deficiency 01/08/2018  . Vitamin D deficiency 01/08/2018  . History of pulmonary embolism 01/08/2018  . Status post total left knee replacement 10/13/2017  . Family history of thyroid disease 08/25/2017  . Chronic pain of left knee 05/20/2017  . Unilateral primary osteoarthritis, left knee 05/20/2017  . Morbid obesity ([place]) 04/30/2017  . Family history of ovarian cancer 02/17/2017  . IBS (irritable bowel syndrome) 01/21/2017  . Dyspnea 10/08/2016  . Paroxysmal atrial fibrillation ([place]) 06/18/2016  . Type 2 diabetes mellitus without complication, without long-term current use of insulin ([place]) 01/21/2016  . H/O Spinal surgery 12/21/2015  . Barrett's esophagus 12/21/2015  . Hx of adenomatous polyp of colon 03/01/2002    Darrel Hoover    PT 03/29/2020, 3:39 PM  [facility] [address] [place], [place], [address] Phone: [phone]   Fax:  [phone]  Name: [name] MRN: [id number] Date of Birth: [birth year]  Problem List Patient Active Problem List   Diagnosis Date Noted  . Status post total replacement of left hip 02/17/2020  . Unilateral primary osteoarthritis, left hip 02/16/2020  . Diarrhea   . B12 deficiency 01/08/2018  . Vitamin D deficiency 01/08/2018  . History of pulmonary embolism 01/08/2018  . Status post total left knee replacement 10/13/2017  . Family history of thyroid disease 08/25/2017  . Chronic pain of left knee 05/20/2017  . Unilateral primary osteoarthritis, left knee 05/20/2017  . Morbid obesity ([place]) 04/30/2017  . Family history of ovarian cancer 02/17/2017  . IBS (irritable bowel syndrome) 01/21/2017  . Dyspnea 10/08/2016  . Paroxysmal atrial fibrillation ([place]) 06/18/2016  . Type 2 diabetes mellitus without complication, without long-term current use of insulin ([place]) 01/21/2016  . H/O Spinal surgery 12/21/2015  . Barrett's esophagus 12/21/2015  . Hx of adenomatous polyp of colon 03/01/2002    Darrel Hoover    PT 03/29/2020, 3:39 PM  [facility] [address] [place], [place], [address] Phone: [phone]   Fax:  [phone]  Name: [name] MRN: [id number] Date of Birth: [birth year]

## 2020-04-02 ENCOUNTER — Ambulatory Visit: Payer: PRIVATE HEALTH INSURANCE

## 2020-04-02 ENCOUNTER — Encounter: Payer: Self-pay | Admitting: Family Medicine

## 2020-04-04 ENCOUNTER — Ambulatory Visit: Payer: PRIVATE HEALTH INSURANCE | Admitting: Physical Therapy

## 2020-04-05 ENCOUNTER — Ambulatory Visit (INDEPENDENT_AMBULATORY_CARE_PROVIDER_SITE_OTHER): Payer: PRIVATE HEALTH INSURANCE | Admitting: Physician Assistant

## 2020-04-05 ENCOUNTER — Encounter: Payer: Self-pay | Admitting: Physician Assistant

## 2020-04-05 DIAGNOSIS — Z96642 Presence of left artificial hip joint: Secondary | ICD-10-CM

## 2020-04-05 MED ORDER — OXYCODONE HCL 5 MG PO TABS
5.0000 mg | ORAL_TABLET | ORAL | 0 refills | Status: DC | PRN
Start: 2020-04-05 — End: 2020-10-29

## 2020-04-05 NOTE — Progress Notes (Signed)
HPI: [name] returns today almost 7 weeks status post left total hip arthroplasty.  Mostly for incision check.  She states she is slowly improving.  Main problem at this point is she had a reaction to the metoprolol and Dr. Junius Roads is working on slowly weaning her off of this.  She does have some stiffness whenever she first begins to walk in the left hip.  He had no fevers chills.  She is ambulating with a cane.  She is on chronic Eliquis.  Needs a refill on her pain medicine.  Physical exam: Left hip good range of motion.  Calf supple nontender dorsiflexion plantarflexion intact on the left.  Surgical incisions well-healed no signs of infection no recurrent seroma.  Impression: Status post left total hip arthroplasty 02/17/2020  Plan: She will follow up with Dr. Ninfa Linden around January 13 to talk about her returning to work.  In the interim she will work on scar tissue mobilization gait balance and strengthening left hip.  Questions were encouraged and answered at length today refill on oxygen for none was given.

## 2020-04-09 ENCOUNTER — Ambulatory Visit: Payer: PRIVATE HEALTH INSURANCE

## 2020-04-09 ENCOUNTER — Other Ambulatory Visit: Payer: Self-pay | Admitting: Emergency Medicine

## 2020-04-11 ENCOUNTER — Ambulatory Visit: Payer: PRIVATE HEALTH INSURANCE | Admitting: Physical Therapy

## 2020-04-11 ENCOUNTER — Other Ambulatory Visit: Payer: Self-pay

## 2020-04-11 ENCOUNTER — Encounter: Payer: Self-pay | Admitting: Physical Therapy

## 2020-04-11 DIAGNOSIS — R2689 Other abnormalities of gait and mobility: Secondary | ICD-10-CM | POA: Diagnosis not present

## 2020-04-11 DIAGNOSIS — M25552 Pain in left hip: Secondary | ICD-10-CM

## 2020-04-11 DIAGNOSIS — M6281 Muscle weakness (generalized): Secondary | ICD-10-CM

## 2020-04-11 DIAGNOSIS — R262 Difficulty in walking, not elsewhere classified: Secondary | ICD-10-CM

## 2020-04-11 DIAGNOSIS — Z96642 Presence of left artificial hip joint: Secondary | ICD-10-CM

## 2020-04-11 NOTE — Patient Instructions (Signed)
Access Code: [email or web address] URL: [email or web address] Date: 04/11/2020 Prepared by: Hilda Blades  Exercises Supine March - 1-2 x daily - 7 x weekly - 2 sets - 20 reps Bridge - 1-2 x daily - 7 x weekly - 3 sets - 5 reps Bent Knee Fallouts - 1-2 x daily - 7 x weekly - 2 sets - 10 reps Sidelying Hip Abduction - 1-2 x daily - 7 x weekly - 3 sets - 5 reps Seated Long Arc Quad - 1-2 x daily - 7 x weekly - 2 sets - 15 reps Sit to Stand - 1-2 x daily - 7 x weekly - 3 sets - 5 reps

## 2020-04-11 NOTE — Therapy (Signed)
focused on progressing hip strength and balance with good tolerance. Patient exhibits improved strength of left hip and with 5xSTS, but continues to demonstrate gross weakness resulting in gait deviations. HEP progressed this visit. Patient would benefit from continued skilled PT to progress her mobility and strength in order to improve walking and maximize functional ability.    PT Treatment/Interventions ADLs/Self Care Home Management;Electrical Stimulation;Gait training;Stair training;Functional mobility training;Therapeutic activities;Therapeutic exercise;Balance training;Neuromuscular re-education;Cognitive remediation;Patient/family education;Passive range of motion    PT Next Visit Plan Active and assisted exercis as she is able   HEP for pain management  and exercises. Manual , ROM , balance,  Gait    PT Home Exercise Plan QS, abduction slides, heel slides,   weight shift to LT    Consulted and Agree with Plan of Care Patient           Patient will benefit from skilled therapeutic intervention in order to improve the following deficits and impairments:  Decreased balance,Abnormal gait,Decreased mobility,Decreased activity tolerance,Decreased range of motion,Difficulty walking,Obesity,Pain,Decreased strength  Visit Diagnosis: Other abnormalities of gait and mobility  Pain in left hip  History of total left hip replacement  Difficulty in walking, not elsewhere classified  Muscle weakness (generalized)     Problem  List Patient Active Problem List   Diagnosis Date Noted  . Status post total replacement of left hip 02/17/2020  . Unilateral primary osteoarthritis, left hip 02/16/2020  . Diarrhea   . B12 deficiency 01/08/2018  . Vitamin D deficiency 01/08/2018  . History of pulmonary embolism 01/08/2018  . Status post total left knee replacement 10/13/2017  . Family history of thyroid disease 08/25/2017  . Chronic pain of left knee 05/20/2017  . Unilateral primary osteoarthritis, left knee 05/20/2017  . Morbid obesity ([place]) 04/30/2017  . Family history of ovarian cancer 02/17/2017  . IBS (irritable bowel syndrome) 01/21/2017  . Dyspnea 10/08/2016  . Paroxysmal atrial fibrillation ([place]) 06/18/2016  . Type 2 diabetes mellitus without complication, without long-term current use of insulin ([place]) 01/21/2016  . H/O Spinal surgery 12/21/2015  . Barrett's esophagus 12/21/2015  . Hx of adenomatous polyp of colon 03/01/2002    Hilda Blades, PT, DPT, LAT, ATC 04/11/20  3:49 PM Phone: [phone] Fax: [facility] [address] [place], [place], [address] Phone: [phone]   Fax:  [phone]  Name: [name] MRN: [id number] Date of Birth: [birth year]  focused on progressing hip strength and balance with good tolerance. Patient exhibits improved strength of left hip and with 5xSTS, but continues to demonstrate gross weakness resulting in gait deviations. HEP progressed this visit. Patient would benefit from continued skilled PT to progress her mobility and strength in order to improve walking and maximize functional ability.    PT Treatment/Interventions ADLs/Self Care Home Management;Electrical Stimulation;Gait training;Stair training;Functional mobility training;Therapeutic activities;Therapeutic exercise;Balance training;Neuromuscular re-education;Cognitive remediation;Patient/family education;Passive range of motion    PT Next Visit Plan Active and assisted exercis as she is able   HEP for pain management  and exercises. Manual , ROM , balance,  Gait    PT Home Exercise Plan QS, abduction slides, heel slides,   weight shift to LT    Consulted and Agree with Plan of Care Patient           Patient will benefit from skilled therapeutic intervention in order to improve the following deficits and impairments:  Decreased balance,Abnormal gait,Decreased mobility,Decreased activity tolerance,Decreased range of motion,Difficulty walking,Obesity,Pain,Decreased strength  Visit Diagnosis: Other abnormalities of gait and mobility  Pain in left hip  History of total left hip replacement  Difficulty in walking, not elsewhere classified  Muscle weakness (generalized)     Problem  List Patient Active Problem List   Diagnosis Date Noted  . Status post total replacement of left hip 02/17/2020  . Unilateral primary osteoarthritis, left hip 02/16/2020  . Diarrhea   . B12 deficiency 01/08/2018  . Vitamin D deficiency 01/08/2018  . History of pulmonary embolism 01/08/2018  . Status post total left knee replacement 10/13/2017  . Family history of thyroid disease 08/25/2017  . Chronic pain of left knee 05/20/2017  . Unilateral primary osteoarthritis, left knee 05/20/2017  . Morbid obesity ([place]) 04/30/2017  . Family history of ovarian cancer 02/17/2017  . IBS (irritable bowel syndrome) 01/21/2017  . Dyspnea 10/08/2016  . Paroxysmal atrial fibrillation ([place]) 06/18/2016  . Type 2 diabetes mellitus without complication, without long-term current use of insulin ([place]) 01/21/2016  . H/O Spinal surgery 12/21/2015  . Barrett's esophagus 12/21/2015  . Hx of adenomatous polyp of colon 03/01/2002    Hilda Blades, PT, DPT, LAT, ATC 04/11/20  3:49 PM Phone: [phone] Fax: [facility] [address] [place], [place], [address] Phone: [phone]   Fax:  [phone]  Name: [name] MRN: [id number] Date of Birth: [birth year]  [place], [place], [address] Phone: [phone]   Fax:  [phone]  Physical Therapy Treatment  Patient Details  Name: SUMAIRA SURRIDGE MRN: [id number] Date of Birth: 04-23-1950 Referring Provider (PT): Jean Rosenthal, MD   Encounter Date: 04/11/2020   PT End of Session - 04/11/20 1453    Visit Number 3    Number of Visits 24    Date for PT Re-Evaluation 06/01/20    Authorization Type [employer]    PT Start Time 1445    PT Stop Time 1530    PT Time Calculation (min) 45 min    Activity Tolerance Patient tolerated treatment well    Behavior During Therapy [facility] for tasks assessed/performed           Past Medical History:  Diagnosis Date  . Abnormal thyroid function test   . Allergy   . Anemia    she attributes previously to fibroids, she declines  . Barrett's esophagus   . Blood transfusion without reported diagnosis   . DDD (degenerative disc disease), cervical   . DJD (degenerative joint disease)   . Dysrhythmia   . GERD (gastroesophageal reflux disease)   . Heart murmur   . History of bronchitis   . History of hiatal hernia   . Hx of adenomatous polyp of colon 03/01/2002  . Myocardial infarction ([place])   . OA (osteoarthritis)   . Obese   . Paroxysmal atrial fibrillation ([facility])   . PE (pulmonary embolism) 12/21/2015  . Plantar fasciitis   . Pneumonia   . PONV (postoperative nausea and vomiting)    pt. reports that she woke up during surgery  . Pre-diabetes   . Pulmonary embolism [facility])     Past Surgical History:  Procedure Laterality Date  . ABDOMINAL HYSTERECTOMY    . Arthroscopic surgery left knee    . BACK SURGERY     2005 ,  2017  . BIOPSY  07/26/2019   Procedure: BIOPSY;  Surgeon: Gatha Mayer, MD;  Location: [facility];  Service: Endoscopy;;  . CESAREAN SECTION    . CHOLECYSTECTOMY    . COLONOSCOPY  2003, 2011   3 mm adenoma 2011  . COLONOSCOPY WITH PROPOFOL N/A  07/26/2019   Procedure: COLONOSCOPY WITH PROPOFOL;  Surgeon: Gatha Mayer, MD;  Location: [facility];  Service: Endoscopy;  Laterality: N/A;  . ESOPHAGOGASTRODUODENOSCOPY  multiple since 2003  . ESOPHAGOGASTRODUODENOSCOPY (EGD) WITH PROPOFOL N/A 07/26/2019   Procedure: ESOPHAGOGASTRODUODENOSCOPY (EGD) WITH PROPOFOL;  Surgeon: Gatha Mayer, MD;  [facility];  Service: Endoscopy;  Laterality: N/A;  . IR GENERIC HISTORICAL  12/19/2015   IR ANGIOGRAM SELECTIVE EACH ADDITIONAL VESSEL 12/19/2015 Greggory Keen, MD [facility]  . IR GENERIC HISTORICAL  12/19/2015   IR ANGIOGRAM PULMONARY BILATERAL SELECTIVE 12/19/2015 Greggory Keen, MD [facility]  . IR GENERIC HISTORICAL  12/19/2015   IR INFUSION THROMBOL ARTERIAL INITIAL (MS) 12/19/2015 Greggory Keen, MD [facility]  . IR GENERIC HISTORICAL  12/19/2015   IR US GUIDE VASC ACCESS RIGHT 12/19/2015 Greggory Keen, MD [facility]  . IR GENERIC HISTORICAL  12/19/2015   IR ANGIOGRAM SELECTIVE EACH ADDITIONAL VESSEL 12/19/2015 Greggory Keen, MD [facility]  . IR GENERIC HISTORICAL  12/20/2015   IR THROMB F/U EVAL ART/VEN FINAL DAY (MS) 12/20/2015 Corrie Mckusick, DO [facility]  . IR GENERIC HISTORICAL  12/19/2015   IR INFUSION THROMBOL ARTERIAL INITIAL (MS) 12/19/2015 Greggory Keen, MD [facility]  . PLANTAR FASCIA RELEASE  focused on progressing hip strength and balance with good tolerance. Patient exhibits improved strength of left hip and with 5xSTS, but continues to demonstrate gross weakness resulting in gait deviations. HEP progressed this visit. Patient would benefit from continued skilled PT to progress her mobility and strength in order to improve walking and maximize functional ability.    PT Treatment/Interventions ADLs/Self Care Home Management;Electrical Stimulation;Gait training;Stair training;Functional mobility training;Therapeutic activities;Therapeutic exercise;Balance training;Neuromuscular re-education;Cognitive remediation;Patient/family education;Passive range of motion    PT Next Visit Plan Active and assisted exercis as she is able   HEP for pain management  and exercises. Manual , ROM , balance,  Gait    PT Home Exercise Plan QS, abduction slides, heel slides,   weight shift to LT    Consulted and Agree with Plan of Care Patient           Patient will benefit from skilled therapeutic intervention in order to improve the following deficits and impairments:  Decreased balance,Abnormal gait,Decreased mobility,Decreased activity tolerance,Decreased range of motion,Difficulty walking,Obesity,Pain,Decreased strength  Visit Diagnosis: Other abnormalities of gait and mobility  Pain in left hip  History of total left hip replacement  Difficulty in walking, not elsewhere classified  Muscle weakness (generalized)     Problem  List Patient Active Problem List   Diagnosis Date Noted  . Status post total replacement of left hip 02/17/2020  . Unilateral primary osteoarthritis, left hip 02/16/2020  . Diarrhea   . B12 deficiency 01/08/2018  . Vitamin D deficiency 01/08/2018  . History of pulmonary embolism 01/08/2018  . Status post total left knee replacement 10/13/2017  . Family history of thyroid disease 08/25/2017  . Chronic pain of left knee 05/20/2017  . Unilateral primary osteoarthritis, left knee 05/20/2017  . Morbid obesity ([place]) 04/30/2017  . Family history of ovarian cancer 02/17/2017  . IBS (irritable bowel syndrome) 01/21/2017  . Dyspnea 10/08/2016  . Paroxysmal atrial fibrillation ([place]) 06/18/2016  . Type 2 diabetes mellitus without complication, without long-term current use of insulin ([place]) 01/21/2016  . H/O Spinal surgery 12/21/2015  . Barrett's esophagus 12/21/2015  . Hx of adenomatous polyp of colon 03/01/2002    Hilda Blades, PT, DPT, LAT, ATC 04/11/20  3:49 PM Phone: [phone] Fax: [facility] [address] [place], [place], [address] Phone: [phone]   Fax:  [phone]  Name: [name] MRN: [id number] Date of Birth: [birth year]

## 2020-04-16 ENCOUNTER — Ambulatory Visit: Payer: PRIVATE HEALTH INSURANCE | Admitting: Physical Therapy

## 2020-04-18 ENCOUNTER — Ambulatory Visit: Payer: PRIVATE HEALTH INSURANCE

## 2020-04-24 ENCOUNTER — Ambulatory Visit: Payer: PRIVATE HEALTH INSURANCE | Admitting: Physical Therapy

## 2020-04-26 ENCOUNTER — Ambulatory Visit: Payer: PRIVATE HEALTH INSURANCE | Attending: Orthopaedic Surgery | Admitting: Physical Therapy

## 2020-04-26 ENCOUNTER — Encounter: Payer: Self-pay | Admitting: Physical Therapy

## 2020-04-26 ENCOUNTER — Other Ambulatory Visit: Payer: Self-pay

## 2020-04-26 DIAGNOSIS — Z96642 Presence of left artificial hip joint: Secondary | ICD-10-CM

## 2020-04-26 DIAGNOSIS — R2689 Other abnormalities of gait and mobility: Secondary | ICD-10-CM

## 2020-04-26 DIAGNOSIS — M6281 Muscle weakness (generalized): Secondary | ICD-10-CM | POA: Diagnosis present

## 2020-04-26 DIAGNOSIS — M25552 Pain in left hip: Secondary | ICD-10-CM | POA: Insufficient documentation

## 2020-04-26 DIAGNOSIS — R262 Difficulty in walking, not elsewhere classified: Secondary | ICD-10-CM

## 2020-04-26 NOTE — Therapy (Addendum)
[place], [place], [place], [address] Phone: [phone]   Fax:  [phone]  Physical Therapy Treatment / Discharge  Patient Details  Name: [name] MRN: [id number] Date of Birth: 1951/01/27 Referring Provider (PT): Jean Rosenthal, MD   Encounter Date: 04/26/2020   PT End of Session - 04/26/20 1526    Visit Number 4    Number of Visits 24    Date for PT Re-Evaluation 06/01/20    Authorization Type [employer]    PT Start Time 1530    PT Stop Time 1610    PT Time Calculation (min) 40 min    Activity Tolerance Patient tolerated treatment well    Behavior During Therapy [facility] for tasks assessed/performed           Past Medical History:  Diagnosis Date  . Abnormal thyroid function test   . Allergy   . Anemia    she attributes previously to fibroids, she declines  . Barrett's esophagus   . Blood transfusion without reported diagnosis   . DDD (degenerative disc disease), cervical   . DJD (degenerative joint disease)   . Dysrhythmia   . GERD (gastroesophageal reflux disease)   . Heart murmur   . History of bronchitis   . History of hiatal hernia   . Hx of adenomatous polyp of colon 03/01/2002  . Myocardial infarction ([place])   . OA (osteoarthritis)   . Obese   . Paroxysmal atrial fibrillation ([facility])   . PE (pulmonary embolism) 12/21/2015  . Plantar fasciitis   . Pneumonia   . PONV (postoperative nausea and vomiting)    pt. reports that she woke up during surgery  . Pre-diabetes   . Pulmonary embolism [facility])     Past Surgical History:  Procedure Laterality Date  . ABDOMINAL HYSTERECTOMY    . Arthroscopic surgery left knee    . BACK SURGERY     2005 ,  2017  . BIOPSY  07/26/2019   Procedure: BIOPSY;  Surgeon: Gatha Mayer, MD;  Location: [facility];  Service: Endoscopy;;  . CESAREAN SECTION    . CHOLECYSTECTOMY    . COLONOSCOPY  2003, 2011   3 mm adenoma 2011  . COLONOSCOPY WITH  PROPOFOL N/A 07/26/2019   Procedure: COLONOSCOPY WITH PROPOFOL;  Surgeon: Gatha Mayer, MD;  Location: [facility];  Service: Endoscopy;  Laterality: N/A;  . ESOPHAGOGASTRODUODENOSCOPY  multiple since 2003  . ESOPHAGOGASTRODUODENOSCOPY (EGD) WITH PROPOFOL N/A 07/26/2019   Procedure: ESOPHAGOGASTRODUODENOSCOPY (EGD) WITH PROPOFOL;  Surgeon: Gatha Mayer, MD;  [facility];  Service: Endoscopy;  Laterality: N/A;  . IR GENERIC HISTORICAL  12/19/2015   IR ANGIOGRAM SELECTIVE EACH ADDITIONAL VESSEL 12/19/2015 Greggory Keen, MD [facility]  . IR GENERIC HISTORICAL  12/19/2015   IR ANGIOGRAM PULMONARY BILATERAL SELECTIVE 12/19/2015 Greggory Keen, MD [facility]  . IR GENERIC HISTORICAL  12/19/2015   IR INFUSION THROMBOL ARTERIAL INITIAL (MS) 12/19/2015 Greggory Keen, MD [facility]  . IR GENERIC HISTORICAL  12/19/2015   IR US GUIDE VASC ACCESS RIGHT 12/19/2015 Greggory Keen, MD [facility]  . IR GENERIC HISTORICAL  12/19/2015   IR ANGIOGRAM SELECTIVE EACH ADDITIONAL VESSEL 12/19/2015 Greggory Keen, MD [facility]  . IR GENERIC HISTORICAL  12/20/2015   IR THROMB F/U EVAL ART/VEN FINAL DAY (MS) 12/20/2015 Corrie Mckusick, DO [facility]  . IR GENERIC HISTORICAL  12/19/2015   IR INFUSION THROMBOL ARTERIAL INITIAL (MS) 12/19/2015 Greggory Keen, MD [facility]  . PLANTAR  this visit. Continued focus on progress hip and general LE strength. Patient was able to tolerate some standing exercises his visit. She continues to ambulate with with [facility] with antalgic gait on on left, but demonstrates improved gait speed and distance walked safely. She is able to ambulate short distances without AD but with greater devations. No change to HEP this visit. Patient would benefit from continued skilled PT to progress her mobility and strength in order to improve walking and maximize functional ability.    PT Treatment/Interventions ADLs/Self Care Home Management;Electrical Stimulation;Gait training;Stair training;Functional mobility training;Therapeutic activities;Therapeutic exercise;Balance training;Neuromuscular re-education;Cognitive remediation;Patient/family education;Passive range of motion    PT Next Visit Plan Review HEP and progress PRN, continued strengthening for hip and general LE as tolerated, gait training, balance training    PT Home Exercise Plan [email or web address]    Consulted and Agree with Plan of Care Patient           Patient will benefit from skilled therapeutic intervention in order to improve the following deficits and impairments:  Decreased balance,Abnormal gait,Decreased mobility,Decreased activity tolerance,Decreased range of motion,Difficulty walking,Obesity,Pain,Decreased strength  Visit Diagnosis: Other abnormalities of gait and mobility  Pain in left hip  History of total left hip replacement  Difficulty in walking, not  elsewhere classified  Muscle weakness (generalized)     Problem List Patient Active Problem List   Diagnosis Date Noted  . Status post total replacement of left hip 02/17/2020  . Unilateral primary osteoarthritis, left hip 02/16/2020  . Diarrhea   . B12 deficiency 01/08/2018  . Vitamin D deficiency 01/08/2018  . History of pulmonary embolism 01/08/2018  . Status post total left knee replacement 10/13/2017  . Family history of thyroid disease 08/25/2017  . Chronic pain of left knee 05/20/2017  . Unilateral primary osteoarthritis, left knee 05/20/2017  . Morbid obesity ([place]) 04/30/2017  . Family history of ovarian cancer 02/17/2017  . IBS (irritable bowel syndrome) 01/21/2017  . Dyspnea 10/08/2016  . Paroxysmal atrial fibrillation ([place]) 06/18/2016  . Type 2 diabetes mellitus without complication, without long-term current use of insulin ([place]) 01/21/2016  . H/O Spinal surgery 12/21/2015  . Barrett's esophagus 12/21/2015  . Hx of adenomatous polyp of colon 03/01/2002    Hilda Blades, PT, DPT, LAT, ATC 04/26/20  4:13 PM Phone: [phone] Fax: [facility] [address] [place], [place], [address] Phone: [phone]   Fax:  [phone]  Name: ANJELI CASAD MRN: [id number] Date of Birth: 12-21-1950   PHYSICAL THERAPY DISCHARGE SUMMARY  Visits from Start of Care: 4  Current functional level related to goals / functional outcomes: See above   Remaining deficits: See above   Education / Equipment: HEP  Plan: Patient agrees to discharge.  Patient goals were not met. Patient is being discharged due to not returning since the last visit.  ?????    Hilda Blades, PT, DPT, LAT, ATC 06/04/20  10:42 AM Phone: [phone] Fax: [phone]  [place], [place], [place], [address] Phone: [phone]   Fax:  [phone]  Physical Therapy Treatment / Discharge  Patient Details  Name: [name] MRN: [id number] Date of Birth: 1951/01/27 Referring Provider (PT): Jean Rosenthal, MD   Encounter Date: 04/26/2020   PT End of Session - 04/26/20 1526    Visit Number 4    Number of Visits 24    Date for PT Re-Evaluation 06/01/20    Authorization Type [employer]    PT Start Time 1530    PT Stop Time 1610    PT Time Calculation (min) 40 min    Activity Tolerance Patient tolerated treatment well    Behavior During Therapy [facility] for tasks assessed/performed           Past Medical History:  Diagnosis Date  . Abnormal thyroid function test   . Allergy   . Anemia    she attributes previously to fibroids, she declines  . Barrett's esophagus   . Blood transfusion without reported diagnosis   . DDD (degenerative disc disease), cervical   . DJD (degenerative joint disease)   . Dysrhythmia   . GERD (gastroesophageal reflux disease)   . Heart murmur   . History of bronchitis   . History of hiatal hernia   . Hx of adenomatous polyp of colon 03/01/2002  . Myocardial infarction ([place])   . OA (osteoarthritis)   . Obese   . Paroxysmal atrial fibrillation ([facility])   . PE (pulmonary embolism) 12/21/2015  . Plantar fasciitis   . Pneumonia   . PONV (postoperative nausea and vomiting)    pt. reports that she woke up during surgery  . Pre-diabetes   . Pulmonary embolism [facility])     Past Surgical History:  Procedure Laterality Date  . ABDOMINAL HYSTERECTOMY    . Arthroscopic surgery left knee    . BACK SURGERY     2005 ,  2017  . BIOPSY  07/26/2019   Procedure: BIOPSY;  Surgeon: Gatha Mayer, MD;  Location: [facility];  Service: Endoscopy;;  . CESAREAN SECTION    . CHOLECYSTECTOMY    . COLONOSCOPY  2003, 2011   3 mm adenoma 2011  . COLONOSCOPY WITH  PROPOFOL N/A 07/26/2019   Procedure: COLONOSCOPY WITH PROPOFOL;  Surgeon: Gatha Mayer, MD;  [facility]: [facility];  Service: Endoscopy;  Laterality: N/A;  . ESOPHAGOGASTRODUODENOSCOPY  multiple since 2003  . ESOPHAGOGASTRODUODENOSCOPY (EGD) WITH PROPOFOL N/A 07/26/2019   Procedure: ESOPHAGOGASTRODUODENOSCOPY (EGD) WITH PROPOFOL;  Surgeon: Gatha Mayer, MD;  [facility];  Service: Endoscopy;  Laterality: N/A;  . IR GENERIC HISTORICAL  12/19/2015   IR ANGIOGRAM SELECTIVE EACH ADDITIONAL VESSEL 12/19/2015 Gregg[facility] Keen, MD [facility]  . IR GENERIC HISTORICAL  12/19/2015   IR ANGIOGRAM PULMONARY BILATERAL SELECTIVE 12/19/2015 Greggory Keen, MD [facility]  . IR GENERIC HISTORICAL  12/19/2015   IR INFUSION THROMBOL ARTERIAL INITIAL (MS) 12/19/2015 Greggory Keen, MD [facility]  . IR GENERIC HISTORICAL  12/19/2015   IR US GUIDE VASC ACCESS RIGHT 12/19/2015 Greggory Keen, MD [facility]  . IR GENERIC HISTORICAL  12/19/2015   IR ANGIOGRAM SELECTIVE EACH ADDITIONAL VESSEL 12/19/2015 Greggory Keen, MD [facility]  . IR GENERIC HISTORICAL  12/20/2015   IR THROMB F/U EVAL ART/VEN FINAL DAY (MS) 12/20/2015 Corrie Mckusick, DO [facility]  . IR GENERIC HISTORICAL  12/19/2015   IR INFUSION THROMBOL ARTERIAL INITIAL (MS) 12/19/2015 Greggory Keen, MD [facility]  . PLANTAR  this visit. Continued focus on progress hip and general LE strength. Patient was able to tolerate some standing exercises his visit. She continues to ambulate with with [facility] with antalgic gait on on left, but demonstrates improved gait speed and distance walked safely. She is able to ambulate short distances without AD but with greater devations. No change to HEP this visit. Patient would benefit from continued skilled PT to progress her mobility and strength in order to improve walking and maximize functional ability.    PT Treatment/Interventions ADLs/Self Care Home Management;Electrical Stimulation;Gait training;Stair training;Functional mobility training;Therapeutic activities;Therapeutic exercise;Balance training;Neuromuscular re-education;Cognitive remediation;Patient/family education;Passive range of motion    PT Next Visit Plan Review HEP and progress PRN, continued strengthening for hip and general LE as tolerated, gait training, balance training    PT Home Exercise Plan [email or web address]    Consulted and Agree with Plan of Care Patient           Patient will benefit from skilled therapeutic intervention in order to improve the following deficits and impairments:  Decreased balance,Abnormal gait,Decreased mobility,Decreased activity tolerance,Decreased range of motion,Difficulty walking,Obesity,Pain,Decreased strength  Visit Diagnosis: Other abnormalities of gait and mobility  Pain in left hip  History of total left hip replacement  Difficulty in walking, not  elsewhere classified  Muscle weakness (generalized)     Problem List Patient Active Problem List   Diagnosis Date Noted  . Status post total replacement of left hip 02/17/2020  . Unilateral primary osteoarthritis, left hip 02/16/2020  . Diarrhea   . B12 deficiency 01/08/2018  . Vitamin D deficiency 01/08/2018  . History of pulmonary embolism 01/08/2018  . Status post total left knee replacement 10/13/2017  . Family history of thyroid disease 08/25/2017  . Chronic pain of left knee 05/20/2017  . Unilateral primary osteoarthritis, left knee 05/20/2017  . Morbid obesity ([place]) 04/30/2017  . Family history of ovarian cancer 02/17/2017  . IBS (irritable bowel syndrome) 01/21/2017  . Dyspnea 10/08/2016  . Paroxysmal atrial fibrillation ([place]) 06/18/2016  . Type 2 diabetes mellitus without complication, without long-term current use of insulin ([place]) 01/21/2016  . H/O Spinal surgery 12/21/2015  . Barrett's esophagus 12/21/2015  . Hx of adenomatous polyp of colon 03/01/2002    Hilda Blades, PT, DPT, LAT, ATC 04/26/20  4:13 PM Phone: [phone] Fax: [facility] [address] [place], [place], [address] Phone: [phone]   Fax:  [phone]  Name: ANJELI CASAD MRN: [id number] Date of Birth: 12-21-1950   PHYSICAL THERAPY DISCHARGE SUMMARY  Visits from Start of Care: 4  Current functional level related to goals / functional outcomes: See above   Remaining deficits: See above   Education / Equipment: HEP  Plan: Patient agrees to discharge.  Patient goals were not met. Patient is being discharged due to not returning since the last visit.  ?????    Hilda Blades, PT, DPT, LAT, ATC 06/04/20  10:42 AM Phone: [phone] Fax: [phone]

## 2020-05-03 ENCOUNTER — Ambulatory Visit (INDEPENDENT_AMBULATORY_CARE_PROVIDER_SITE_OTHER): Payer: PRIVATE HEALTH INSURANCE

## 2020-05-03 ENCOUNTER — Ambulatory Visit (INDEPENDENT_AMBULATORY_CARE_PROVIDER_SITE_OTHER): Payer: PRIVATE HEALTH INSURANCE | Admitting: Physician Assistant

## 2020-05-03 DIAGNOSIS — Z96642 Presence of left artificial hip joint: Secondary | ICD-10-CM

## 2020-05-03 MED ORDER — TIZANIDINE HCL 2 MG PO CAPS: 2.0000 mg | ORAL_CAPSULE | Freq: Three times a day (TID) | ORAL | 1 refills | Status: DC

## 2020-05-03 NOTE — Progress Notes (Signed)
Office Visit Note   Patient: [name]           Date of Birth: 1950-09-19           MRN: [id number] Visit Date: 05/03/2020              Requested by: Lavada Mesi, MD [address] [place],  [place] [address] PCP: Lavada Mesi, MD   Assessment & Plan: Visit Diagnoses:  1. Status post total replacement of left hip     Plan: We will see her back in 4 weeks.  Did change her muscle relaxant to Zanaflex we will see if this helps with the muscle spasm and stiffness in the anterior thigh.  In regards to her work status we will have her return to work 4 hours a day until follow-up and reevaluate her work status at next office visit.  Questions were encouraged and answered at length.  Follow-Up Instructions: Return in about 4 weeks (around 05/31/2020).   Orders:  Orders Placed This Encounter  Procedures  . XR Pelvis 1-2 Views   Meds ordered this encounter  Medications  . tizanidine (ZANAFLEX) 2 MG capsule    Sig: Take 1 capsule (2 mg total) by mouth 3 (three) times daily.    Dispense:  60 capsule    Refill:  1      Procedures: No procedures performed   Clinical Data: No additional findings.   Subjective: Chief Complaint  Patient presents with  . Left Hip - Follow-up    HPI Mrs. Vonasek returns today follow-up for left total hip arthroplasty which she had on 02/17/2020.  She states that she has discomfort in the anterior aspect of her thigh as if it is stiff and pulling sensation states the muscles get tight and hard after she is walk for any period of time.  Also becomes painful if she sits too long.  She has been taking Robaxin but states this is upsetting her stomach.  She has had no new injury.  She denies any fevers chills. Review of Systems   Objective: Vital Signs: There were no vitals taken for this visit.  Physical Exam General: Well-developed well-nourished female no acute distress mood and affect appropriate. Ortho Exam Left hip good range of  motion.  She has tenderness over the anterior thigh.  There is no rashes skin lesions ulcerations erythema ecchymosis over the anterior thigh.  The surgical incision is healed well there is no signs of infection.  Specialty Comments:  No specialty comments available.  Imaging: XR Pelvis 1-2 Views  Result Date: 05/03/2020 Low AP pelvis: Left total hip arthroplasty components well-seated.  No acute fractures bony abnormalities.    PMFS History: Patient Active Problem List   Diagnosis Date Noted  . Status post total replacement of left hip 02/17/2020  . Unilateral primary osteoarthritis, left hip 02/16/2020  . Diarrhea   . B12 deficiency 01/08/2018  . Vitamin D deficiency 01/08/2018  . History of pulmonary embolism 01/08/2018  . Status post total left knee replacement 10/13/2017  . Family history of thyroid disease 08/25/2017  . Chronic pain of left knee 05/20/2017  . Unilateral primary osteoarthritis, left knee 05/20/2017  . Morbid obesity (HCC) 04/30/2017  . Family history of ovarian cancer 02/17/2017  . IBS (irritable bowel syndrome) 01/21/2017  . Dyspnea 10/08/2016  . Paroxysmal atrial fibrillation (HCC) 06/18/2016  . Type 2 diabetes mellitus without complication, without long-term current use of insulin (HCC) 01/21/2016  . H/O Spinal surgery 12/21/2015  .  Barrett's esophagus 12/21/2015  . Hx of adenomatous polyp of colon 03/01/2002   Past Medical History:  Diagnosis Date  . Abnormal thyroid function test   . Allergy   . Anemia    she attributes previously to fibroids, she declines  . Barrett's esophagus   . Blood transfusion without reported diagnosis   . DDD (degenerative disc disease), cervical   . DJD (degenerative joint disease)   . Dysrhythmia   . GERD (gastroesophageal reflux disease)   . Heart murmur   . History of bronchitis   . History of hiatal hernia   . Hx of adenomatous polyp of colon 03/01/2002  . Myocardial infarction (HCC)   . OA (osteoarthritis)    . Obese   . Paroxysmal atrial fibrillation (HCC)   . PE (pulmonary embolism) 12/21/2015  . Plantar fasciitis   . Pneumonia   . PONV (postoperative nausea and vomiting)    pt. reports that she woke up during surgery  . Pre-diabetes   . Pulmonary embolism (HCC)     Family History  Problem Relation Age of Onset  . Stroke Maternal Grandmother   . Bladder Cancer Maternal Grandmother 50  . Esophageal cancer Maternal Grandmother 50  . Hyperthyroidism Mother        Radioactive Iodine Treatment  . Hypothyroidism Mother   . Cervical cancer Maternal Aunt 50  . Ovarian cancer Daughter 28  . Colon cancer Neg Hx   . Pancreatic cancer Neg Hx   . Rectal cancer Neg Hx   . Stomach cancer Neg Hx   . Diabetes Neg Hx     Past Surgical History:  Procedure Laterality Date  . ABDOMINAL HYSTERECTOMY    . Arthroscopic surgery left knee    . BACK SURGERY     2005 ,  2017  . BIOPSY  07/26/2019   Procedure: BIOPSY;  Surgeon: Iva Boop, MD;  Location: [facility];  Service: Endoscopy;;  . CESAREAN SECTION    . CHOLECYSTECTOMY    . COLONOSCOPY  2003, 2011   3 mm adenoma 2011  . COLONOSCOPY WITH PROPOFOL N/A 07/26/2019   Procedure: COLONOSCOPY WITH PROPOFOL;  Surgeon: Iva Boop, MD;  Location: [facility];  Service: Endoscopy;  Laterality: N/A;  . ESOPHAGOGASTRODUODENOSCOPY  multiple since 2003  . ESOPHAGOGASTRODUODENOSCOPY (EGD) WITH PROPOFOL N/A 07/26/2019   Procedure: ESOPHAGOGASTRODUODENOSCOPY (EGD) WITH PROPOFOL;  Surgeon: Iva Boop, MD;  Location: [facility];  Service: Endoscopy;  Laterality: N/A;  . IR GENERIC HISTORICAL  12/19/2015   IR ANGIOGRAM SELECTIVE EACH ADDITIONAL VESSEL 12/19/2015 [name], MD [facility]  . IR GENERIC HISTORICAL  12/19/2015   IR ANGIOGRAM PULMONARY BILATERAL SELECTIVE 12/19/2015 [name], MD [facility]  . IR GENERIC HISTORICAL  12/19/2015   IR INFUSION THROMBOL ARTERIAL INITIAL (MS) 12/19/2015 [name], MD [facility]  . IR GENERIC  HISTORICAL  12/19/2015   IR US GUIDE VASC ACCESS RIGHT 12/19/2015 [name], MD [facility]  . IR GENERIC HISTORICAL  12/19/2015   IR ANGIOGRAM SELECTIVE EACH ADDITIONAL VESSEL 12/19/2015 [name], MD [facility]  . IR GENERIC HISTORICAL  12/20/2015   IR THROMB F/U EVAL ART/VEN FINAL DAY (MS) 12/20/2015 Gilmer Mor, DO [facility]  . IR GENERIC HISTORICAL  12/19/2015   IR INFUSION THROMBOL ARTERIAL INITIAL (MS) 12/19/2015 [name], MD [facility]  . PLANTAR FASCIA RELEASE     right  . POLYPECTOMY  07/26/2019   Procedure: POLYPECTOMY;  Surgeon: Iva Boop, MD;  Location: [facility];  Service: Endoscopy;;  . Right knee replacement    . SPINAL FUSION  10/31/2015   revision at [facility]   . TONSILLECTOMY    . TOTAL HIP ARTHROPLASTY Left 02/17/2020   Procedure: LEFT TOTAL HIP ARTHROPLASTY ANTERIOR APPROACH;  Surgeon: Kathryne Hitch, MD;  Location: [facility];  Service: Orthopedics;  Laterality: Left;  . TOTAL KNEE ARTHROPLASTY Left 10/13/2017   Procedure: LEFT TOTAL KNEE ARTHROPLASTY;  Surgeon: Kathryne Hitch, MD;  Location: [facility];  Service: Orthopedics;  Laterality: Left;  . ULNAR TUNNEL RELEASE     right  . VENOUS ABLATION     x 2   Social History   Occupational History    Comment: Telephone sales  Tobacco Use  . Smoking status: Never Smoker  . Smokeless tobacco: Never Used  Vaping Use  . Vaping Use: Never used  Substance and Sexual Activity  . Alcohol use: No  . Drug use: No  . Sexual activity: Not Currently    Partners: Male    Birth control/protection: Surgical    Comment: Hysterectomy

## 2020-05-04 ENCOUNTER — Telehealth: Payer: Self-pay | Admitting: Orthopaedic Surgery

## 2020-05-04 NOTE — Telephone Encounter (Signed)
Patient called requesting an updated return to work note be sent to her employer as soon as possible. Patient states her employer has tried several attempts to fax a request for updated note. Please send urgent to patient's employer. Updated return to work note needs to be dated for 05/08/20 and state no restrictions. Please call patient at [phone].

## 2020-05-06 ENCOUNTER — Other Ambulatory Visit: Payer: Self-pay | Admitting: Family Medicine

## 2020-05-31 ENCOUNTER — Encounter: Payer: Self-pay | Admitting: Orthopaedic Surgery

## 2020-05-31 ENCOUNTER — Ambulatory Visit (INDEPENDENT_AMBULATORY_CARE_PROVIDER_SITE_OTHER): Payer: PRIVATE HEALTH INSURANCE | Admitting: Orthopaedic Surgery

## 2020-05-31 DIAGNOSIS — Z96642 Presence of left artificial hip joint: Secondary | ICD-10-CM | POA: Diagnosis not present

## 2020-05-31 NOTE — Progress Notes (Signed)
The patient is now just over 3 months status post a left total hip arthroplasty.  She is working 4 hours per shift.  Given the pain that she is having I think she should continue this for 4 more weeks.  She is slowly getting around better.  She still has some thigh pain on that left side and this does affect her back as she walks around.  Her left hip moves smoothly and fluidly.  It does seem like it is getting better overall.  I gave her a note reflecting her continuing at her current restrictions in terms of hours but no other restrictions other than just limiting her daily work hours.  I will see her back in 4 weeks for repeat exam and we might as well have a standing low AP pelvis and lateral of her left hip at that visit.

## 2020-06-08 ENCOUNTER — Encounter: Payer: Self-pay | Admitting: Orthopaedic Surgery

## 2020-06-15 ENCOUNTER — Telehealth: Payer: Self-pay | Admitting: Orthopaedic Surgery

## 2020-06-15 NOTE — Telephone Encounter (Signed)
Patient submitted medical release form, short term disability form, and $25.00 check payment to [employer]. Accepted 06/15/20

## 2020-06-28 ENCOUNTER — Ambulatory Visit (INDEPENDENT_AMBULATORY_CARE_PROVIDER_SITE_OTHER): Payer: PRIVATE HEALTH INSURANCE | Admitting: Orthopaedic Surgery

## 2020-06-28 ENCOUNTER — Encounter: Payer: Self-pay | Admitting: Orthopaedic Surgery

## 2020-06-28 ENCOUNTER — Ambulatory Visit (INDEPENDENT_AMBULATORY_CARE_PROVIDER_SITE_OTHER): Payer: PRIVATE HEALTH INSURANCE

## 2020-06-28 DIAGNOSIS — Z96642 Presence of left artificial hip joint: Secondary | ICD-10-CM

## 2020-06-28 NOTE — Progress Notes (Signed)
The patient is almost 5 months status post a left total hip arthroplasty.  She still ambulate with a cane.  She is still only able to work about 4-hour days.  She is making good progress overall but she still has some stiffness and achiness.  The left operative hip does move more smoothly and fluidly.  She is having some pain over the IT band and thigh itself.  There is no instability.  An AP pelvis and lateral left hip shows a well-seated total hip arthroplasty with no complicating features.  She will continue to increase her activities as she tolerates.  I will give her a note to allow her to still work 4-hour days for the next 6 weeks.  Starting April 25 she can return to full work days without restrictions.  We will see her back in 6 months to see how she is doing overall with a final just low AP pelvis.

## 2020-07-23 ENCOUNTER — Other Ambulatory Visit: Payer: Self-pay | Admitting: Family Medicine

## 2020-07-26 ENCOUNTER — Ambulatory Visit (INDEPENDENT_AMBULATORY_CARE_PROVIDER_SITE_OTHER): Payer: PRIVATE HEALTH INSURANCE | Admitting: Family Medicine

## 2020-07-26 ENCOUNTER — Encounter: Payer: Self-pay | Admitting: Family Medicine

## 2020-07-26 ENCOUNTER — Other Ambulatory Visit: Payer: Self-pay

## 2020-07-26 VITALS — BP 153/87 | HR 91 | Wt 258.0 lb

## 2020-07-26 DIAGNOSIS — N83202 Unspecified ovarian cyst, left side: Secondary | ICD-10-CM

## 2020-07-26 NOTE — Progress Notes (Signed)
   Subjective:    Patient ID: [name] is a 70 y.o. female presenting with No chief complaint on file.  on 07/26/2020  HPI: Previous right sided cyst, underwent aspiration in 2018. Now with left sided 5 cm cyst, which appears simple on MRI.She reports it is causing her pain. The pain is in her right side and is severe at times, comes and goes. Worse with BMs. Has had her uterus removed. She is having some water weight gain and feels like she might have cycle. Denies any bleeding. Reports hair growth.  Review of Systems  Constitutional: Negative for chills and fever.  Respiratory: Negative for shortness of breath.   Cardiovascular: Negative for chest pain.  Gastrointestinal: Negative for abdominal pain, nausea and vomiting.  Genitourinary: Negative for dysuria.  Skin: Negative for rash.      Objective:    BP (!) 153/87   Pulse 91   Wt 258 lb (117 kg)   BMI 47.19 kg/m  Physical Exam Constitutional:      General: She is not in acute distress.    Appearance: She is well-developed.  HENT:     Head: Normocephalic and atraumatic.  Eyes:     General: No scleral icterus. Cardiovascular:     Rate and Rhythm: Normal rate.  Pulmonary:     Effort: Pulmonary effort is normal.  Abdominal:     Palpations: Abdomen is soft.  Musculoskeletal:     Cervical back: Neck supple.  Skin:    General: Skin is warm and dry.  Neurological:     Mental Status: She is alert and oriented to person, place, and time.         Assessment & Plan:   Problem List Items Addressed This Visit      Unprioritized   Cyst of left ovary - Primary    Has had in the past--and drained with IR--now on the other side. Pain is on right. Will check CA 125 and pelvic sonogram.      Relevant Orders   US PELVIC COMPLETE WITH TRANSVAGINAL   CA 125      Return in about 4 weeks (around 08/23/2020) for needs U/S, in person.  Donnamae Jude 07/27/2020 9:51 AM

## 2020-07-26 NOTE — Patient Instructions (Signed)

## 2020-07-27 ENCOUNTER — Encounter: Payer: Self-pay | Admitting: Family Medicine

## 2020-07-27 ENCOUNTER — Telehealth: Payer: Self-pay | Admitting: Radiology

## 2020-07-27 DIAGNOSIS — N83202 Unspecified ovarian cyst, left side: Secondary | ICD-10-CM | POA: Insufficient documentation

## 2020-07-27 DIAGNOSIS — N83201 Unspecified ovarian cyst, right side: Secondary | ICD-10-CM | POA: Insufficient documentation

## 2020-07-27 NOTE — Assessment & Plan Note (Signed)
Has had in the past--and drained with IR--now on the other side. Pain is on right. Will check CA 125 and pelvic sonogram.

## 2020-07-27 NOTE — Telephone Encounter (Signed)
Called and left voicemail (per pt request) with Ultrasound and follow up appointment details. Ultrasound 08/13/20 @ 12:45 [facility]- GYN f/u with Kennon Rounds 08/30/20 @ 3:15

## 2020-07-28 LAB — CA 125: Cancer Antigen (CA) 125: 9.5 U/mL (ref 0.0–38.1)

## 2020-08-13 ENCOUNTER — Other Ambulatory Visit: Payer: Self-pay

## 2020-08-13 ENCOUNTER — Ambulatory Visit
Admission: RE | Admit: 2020-08-13 | Discharge: 2020-08-13 | Disposition: A | Discharge status: Home / Self Care | Payer: PRIVATE HEALTH INSURANCE | Source: Ambulatory Visit | Attending: Family Medicine | Admitting: Family Medicine

## 2020-08-13 DIAGNOSIS — N83202 Unspecified ovarian cyst, left side: Secondary | ICD-10-CM

## 2020-08-16 ENCOUNTER — Telehealth: Payer: Self-pay | Admitting: *Deleted

## 2020-08-16 ENCOUNTER — Encounter: Payer: Self-pay | Admitting: *Deleted

## 2020-08-16 ENCOUNTER — Telehealth: Payer: Self-pay

## 2020-08-16 DIAGNOSIS — M546 Pain in thoracic spine: Secondary | ICD-10-CM

## 2020-08-16 NOTE — Telephone Encounter (Signed)
Reviewed RN Hildred Alamin note.  Patient following up with [facility] tomorrow.  Spoke full sentences without difficulty today with RN Hildred Alamin on telephone.  A&Ox3.

## 2020-08-16 NOTE — Telephone Encounter (Signed)
Per Dr. Junius Roads, I called the patient to get more information about her symptoms, as she is on the schedule for tomorrow for possible pneumonia. I left a voice mail, asking for a call back regarding this.

## 2020-08-16 NOTE — Telephone Encounter (Signed)
Please advise 

## 2020-08-16 NOTE — Telephone Encounter (Signed)
Pt called stating her lungs are hurting and she has pain going across both shoulder blades. Pt also mentioned having R side pain with a cough that comes and goes but its a non-productive cough. Pt did a at home covid test and that back negative so she would also like to have her anemia checked.   [phone]

## 2020-08-16 NOTE — Telephone Encounter (Addendum)
RN notified by HR that pt called out of work for today and tomorrow citing suspected PNA. Called and spoke with pt at home. She reports working remote typically but being required to come onsite last week for a few days. On Tuesday 4/26 she developed sx of PND, dry cough with R side pain and pain in back across both shoulder blades. She did home Covid test 4/27 that was negative. She does have an appt scheduled with her pcp tomorrow for this. Home pulse ox reading spO2 high 80's, low 90's. She reports her normal is 92-93%. She is speaking in full complete sentences without difficulty. Sts this does not feel like prior PE she had but does feel like PNA or pleurisy. Will review chart tomorrow for pcp recommendations.

## 2020-08-17 ENCOUNTER — Ambulatory Visit: Payer: PRIVATE HEALTH INSURANCE | Admitting: Family Medicine

## 2020-08-17 ENCOUNTER — Other Ambulatory Visit: Payer: Self-pay

## 2020-08-17 ENCOUNTER — Encounter: Payer: Self-pay | Admitting: Emergency Medicine

## 2020-08-17 ENCOUNTER — Emergency Department: Payer: No Typology Code available for payment source

## 2020-08-17 ENCOUNTER — Encounter: Payer: Self-pay | Admitting: Family Medicine

## 2020-08-17 ENCOUNTER — Ambulatory Visit: Payer: Self-pay

## 2020-08-17 ENCOUNTER — Emergency Department
Admission: EM | Admit: 2020-08-17 | Discharge: 2020-08-17 | Disposition: A | Discharge status: Home / Self Care | Payer: No Typology Code available for payment source | Attending: Emergency Medicine | Admitting: Emergency Medicine

## 2020-08-17 DIAGNOSIS — R0602 Shortness of breath: Secondary | ICD-10-CM | POA: Diagnosis not present

## 2020-08-17 DIAGNOSIS — Z96653 Presence of artificial knee joint, bilateral: Secondary | ICD-10-CM | POA: Insufficient documentation

## 2020-08-17 DIAGNOSIS — M546 Pain in thoracic spine: Secondary | ICD-10-CM | POA: Insufficient documentation

## 2020-08-17 DIAGNOSIS — Z7901 Long term (current) use of anticoagulants: Secondary | ICD-10-CM | POA: Insufficient documentation

## 2020-08-17 DIAGNOSIS — Z96642 Presence of left artificial hip joint: Secondary | ICD-10-CM | POA: Insufficient documentation

## 2020-08-17 DIAGNOSIS — E119 Type 2 diabetes mellitus without complications: Secondary | ICD-10-CM | POA: Diagnosis not present

## 2020-08-17 LAB — BASIC METABOLIC PANEL
Anion gap: 9 (ref 5–15)
BUN: 14 mg/dL (ref 8–23)
CO2: 21 mmol/L — ABNORMAL LOW (ref 22–32)
Calcium: 9.2 mg/dL (ref 8.9–10.3)
Chloride: 108 mmol/L (ref 98–111)
Creatinine, Ser: 0.77 mg/dL (ref 0.44–1.00)
GFR, Estimated: >60 mL/min (ref >60)
Glucose, Bld: 121 mg/dL — ABNORMAL HIGH (ref 70–99)
Potassium: 4 mmol/L (ref 3.5–5.1)
Sodium: 138 mmol/L (ref 135–145)

## 2020-08-17 LAB — CBC
HCT: 37.9 % (ref 36.0–46.0)
Hemoglobin: 12.5 g/dL (ref 12.0–15.0)
MCH: 26.4 pg (ref 26.0–34.0)
MCHC: 33 g/dL (ref 30.0–36.0)
MCV: 80 fL (ref 80.0–100.0)
Platelets: 219 10*3/uL (ref 150–400)
RBC: 4.74 MIL/uL (ref 3.87–5.11)
RDW: 13.8 % (ref 11.5–15.5)
WBC: 5.4 10*3/uL (ref 4.0–10.5)
nRBC: 0 % (ref 0.0–0.2)

## 2020-08-17 LAB — TROPONIN I (HIGH SENSITIVITY)
Troponin I (High Sensitivity): 3 ng/L (ref <18)
Troponin I (High Sensitivity): 3 ng/L (ref <18)

## 2020-08-17 MED ORDER — LIDOCAINE 5 % EX PTCH: 1.0000 | MEDICATED_PATCH | Freq: Two times a day (BID) | CUTANEOUS | 0 refills | Status: DC

## 2020-08-17 MED ORDER — IOHEXOL 350 MG/ML SOLN
100.0000 mL | Freq: Once | INTRAVENOUS | Status: AC | PRN
  Administered 2020-08-17: 100 mL via INTRAVENOUS

## 2020-08-17 NOTE — ED Triage Notes (Signed)
Pt reports cough that is non productive, pain that goes across her upper back into shoulder blades and some SOB since Tuesday. Pt concerned she either has pneumonia or bronchitis.

## 2020-08-17 NOTE — Telephone Encounter (Signed)
Left this full message on the patient's mobile voice mail. Advised her to give me a call back with any questions/concerns.

## 2020-08-17 NOTE — Telephone Encounter (Signed)
Noted that pcp office recommended UC instead if needing imaging or having ShOB so pt responded that they were going to UC in [place] later Friday afternoon.

## 2020-08-17 NOTE — Telephone Encounter (Signed)
The patient messaged back - she will be going to an urgent care in [place] this afternoon for evaluation. Cancelled her appointment with Dr. Junius Roads this morning.

## 2020-08-17 NOTE — Telephone Encounter (Signed)
She may need to go to urgent care for evaluation and x-rays, especially if short of breath and having fevers.  We can check for anemia when she's feeling better.

## 2020-08-17 NOTE — ED Provider Notes (Signed)
[facility] Emergency Department Provider Note  ____________________________________________  Time seen: Approximately 8:47 PM  I have reviewed the triage vital signs and the nursing notes.   HISTORY  Chief Complaint Shortness of Breath and Shoulder Pain    HPI [name] is a 70 y.o. female with a history of GERD, paroxysmal atrial fibrillation, saddle pulmonary embolism who comes ED with abrupt onset of upper back pain at the midline radiating to bilateral shoulders associated shortness of breath is been going on for the last 3 days, constant, worse with deep breathing.  No alleviating factors.  Not exertional.  No arm paresthesias or weakness, no neurologic deficits.  Rated as moderate intensity about 5/10.  Reports a negative hypercoagulability work-up, not currently on blood thinners other than 81 mg aspirin.      Past Medical History:  Diagnosis Date  . Abnormal thyroid function test   . Allergy   . Anemia    she attributes previously to fibroids, she declines  . Barrett's esophagus   . Blood transfusion without reported diagnosis   . DDD (degenerative disc disease), cervical   . DJD (degenerative joint disease)   . Dysrhythmia   . GERD (gastroesophageal reflux disease)   . Heart murmur   . History of bronchitis   . History of hiatal hernia   . Hx of adenomatous polyp of colon 03/01/2002  . Myocardial infarction ([place])   . OA (osteoarthritis)   . Obese   . Paroxysmal atrial fibrillation (HCC)   . PE (pulmonary embolism) 12/21/2015  . Plantar fasciitis   . Pneumonia   . PONV (postoperative nausea and vomiting)    pt. reports that she woke up during surgery  . Pre-diabetes   . Pulmonary embolism [facility])      Patient Active Problem List   Diagnosis Date Noted  . Cyst of left ovary 07/27/2020  . Status post total replacement of left hip 02/17/2020  . Unilateral primary osteoarthritis, left hip 02/16/2020  . Diarrhea   . B12  deficiency 01/08/2018  . Vitamin D deficiency 01/08/2018  . History of pulmonary embolism 01/08/2018  . Status post total left knee replacement 10/13/2017  . Family history of thyroid disease 08/25/2017  . Chronic pain of left knee 05/20/2017  . Unilateral primary osteoarthritis, left knee 05/20/2017  . Morbid obesity ([place]) 04/30/2017  . Family history of ovarian cancer 02/17/2017  . IBS (irritable bowel syndrome) 01/21/2017  . Dyspnea 10/08/2016  . Paroxysmal atrial fibrillation ([place]) 06/18/2016  . Type 2 diabetes mellitus without complication, without long-term current use of insulin ([place]) 01/21/2016  . H/O Spinal surgery 12/21/2015  . Barrett's esophagus 12/21/2015  . Hx of adenomatous polyp of colon 03/01/2002     Past Surgical History:  Procedure Laterality Date  . ABDOMINAL HYSTERECTOMY    . Arthroscopic surgery left knee    . BACK SURGERY     2005 ,  2017  . BIOPSY  07/26/2019   Procedure: BIOPSY;  Surgeon: Gatha Mayer, MD;  Location: [facility];  Service: Endoscopy;;  . CESAREAN SECTION    . CHOLECYSTECTOMY    . COLONOSCOPY  2003, 2011   3 mm adenoma 2011  . COLONOSCOPY WITH PROPOFOL N/A 07/26/2019   Procedure: COLONOSCOPY WITH PROPOFOL;  Surgeon: Gatha Mayer, MD;  Location: [facility];  Service: Endoscopy;  Laterality: N/A;  . ESOPHAGOGASTRODUODENOSCOPY  multiple since 2003  . ESOPHAGOGASTRODUODENOSCOPY (EGD) WITH PROPOFOL N/A 07/26/2019   Procedure: ESOPHAGOGASTRODUODENOSCOPY (EGD) WITH PROPOFOL;  Surgeon:  Gatha Mayer, MD;  Location: [facility];  Service: Endoscopy;  Laterality: N/A;  . IR GENERIC HISTORICAL  12/19/2015   IR ANGIOGRAM SELECTIVE EACH ADDITIONAL VESSEL 12/19/2015 Greggory Keen, MD [facility]  . IR GENERIC HISTORICAL  12/19/2015   IR ANGIOGRAM PULMONARY BILATERAL SELECTIVE 12/19/2015 Greggory Keen, MD [facility]  . IR GENERIC HISTORICAL  12/19/2015   IR INFUSION THROMBOL ARTERIAL INITIAL (MS) 12/19/2015 Greggory Keen, MD [facility]  .  IR GENERIC HISTORICAL  12/19/2015   IR US GUIDE VASC ACCESS RIGHT 12/19/2015 Greggory Keen, MD [facility]  . IR GENERIC HISTORICAL  12/19/2015   IR ANGIOGRAM SELECTIVE EACH ADDITIONAL VESSEL 12/19/2015 Greggory Keen, MD [facility]  . IR GENERIC HISTORICAL  12/20/2015   IR THROMB F/U EVAL ART/VEN FINAL DAY (MS) 12/20/2015 Corrie Mckusick, DO [facility]  . IR GENERIC HISTORICAL  12/19/2015   IR INFUSION THROMBOL ARTERIAL INITIAL (MS) 12/19/2015 Greggory Keen, MD [facility]  . PLANTAR FASCIA RELEASE     right  . POLYPECTOMY  07/26/2019   Procedure: POLYPECTOMY;  Surgeon: Gatha Mayer, MD;  Location: [facility];  Service: Endoscopy;;  . Right knee replacement    . SPINAL FUSION  10/31/2015   revision at [place]    . TOTAL HIP ARTHROPLASTY Left 02/17/2020   Procedure: LEFT TOTAL HIP ARTHROPLASTY ANTERIOR APPROACH;  Surgeon: Mcarthur Rossetti, MD;  Location: [facility];  Service: Orthopedics;  Laterality: Left;  . TOTAL KNEE ARTHROPLASTY Left 10/13/2017   Procedure: LEFT TOTAL KNEE ARTHROPLASTY;  Surgeon: Mcarthur Rossetti, MD;  Location: [place];  Service: Orthopedics;  Laterality: Left;  . ULNAR TUNNEL RELEASE     right  . VENOUS ABLATION     x 2     Prior to Admission medications   Medication Sig Start Date End Date Taking? Authorizing Provider  lidocaine (LIDODERM) 5 % Place 1 patch onto the skin every 12 (twelve) hours. Remove & Discard patch within 12 hours or as directed by MD 08/17/20  Yes Carrie Mew, MD  diphenoxylate-atropine (LOMOTIL) 2.5-0.025 MG tablet TAKE 1 TO 2 TABLETS BY MOUTH 4 TIMES A DAY AS NEEDED FOR DIARRHEA/LOOSE STOOLS Patient not taking: Reported on 07/26/2020 07/23/20   Hilts, Legrand Como, MD  ELIQUIS 5 MG TABS tablet TAKE 1 TABLET BY MOUTH TWICE A DAY 04/09/20   Byrum, Rose Fillers, MD  methocarbamol (ROBAXIN) 500 MG tablet Take 1 tablet (500 mg total) by mouth every 6 (six) hours as needed for muscle spasms. Patient not taking:  Reported on 07/26/2020 02/21/20   Mcarthur Rossetti, MD  metoprolol tartrate (LOPRESSOR) 25 MG tablet TAKE 1 TABLET BY MOUTH TWICE A DAY MAY INCREASE TO 2 TABLET TWICE A DAY AS NEEDED 05/06/20   Hilts, Legrand Como, MD  Multiple Vitamin (MULTIVITAMIN WITH MINERALS) TABS tablet Take 1 tablet by mouth daily.    [provider]  oxyCODONE (OXY IR/ROXICODONE) 5 MG immediate release tablet Take 1-2 tablets (5-10 mg total) by mouth every 4 (four) hours as needed for moderate pain (pain score 4-6). Patient not taking: Reported on 07/26/2020 04/05/20   Pete Pelt, PA-C  tizanidine (ZANAFLEX) 2 MG capsule Take 1 capsule (2 mg total) by mouth 3 (three) times daily. Patient not taking: Reported on 07/26/2020 05/03/20   Pete Pelt, PA-C     Allergies Ciprofloxacin, Doxycycline, Metoprolol, Strawberry extract, and Tetracyclines & related   Family History  Problem Relation Age of Onset  . Stroke Maternal Grandmother   .  Bladder Cancer Maternal Grandmother 36  . Esophageal cancer Maternal Grandmother 12  . Hyperthyroidism Mother        Radioactive Iodine Treatment  . Hypothyroidism Mother   . Cervical cancer Maternal Aunt 50  . Ovarian cancer Daughter 67  . Colon cancer Neg Hx   . Pancreatic cancer Neg Hx   . Rectal cancer Neg Hx   . Stomach cancer Neg Hx   . Diabetes Neg Hx     Social History Social History   Tobacco Use  . Smoking status: Never Smoker  . Smokeless tobacco: Never Used  Vaping Use  . Vaping Use: Never used  Substance Use Topics  . Alcohol use: No  . Drug use: No    Review of Systems  Constitutional:   No fever or chills.  ENT:   No sore throat. No rhinorrhea. Cardiovascular:   No chest pain or syncope. Respiratory: Positive shortness of breath and nonproductive cough. Gastrointestinal:   Negative for abdominal pain, vomiting and diarrhea.  Musculoskeletal: Positive upper back pain as above All other systems reviewed and are negative except as  documented above in ROS and HPI.  ____________________________________________   PHYSICAL EXAM:  VITAL SIGNS: ED Triage Vitals  Enc Vitals Group     BP 08/17/20 1430 (!) 169/84     Pulse Rate 08/17/20 1430 69     Resp 08/17/20 1430 20     Temp 08/17/20 1430 98.2 F (36.8 C)     Temp Source 08/17/20 1430 Oral     SpO2 08/17/20 1430 96 %     Weight 08/17/20 1431 250 lb (113.4 kg)     Height 08/17/20 1431 5\' 2"  (1.575 m)     Head Circumference --      Peak Flow --      Pain Score 08/17/20 1430 6     Pain Loc --      Pain Edu? --      Excl. in [place]? --     Vital signs reviewed, nursing assessments reviewed.   Constitutional:   Alert and oriented. Non-toxic appearance. Eyes:   Conjunctivae are normal. EOMI. PERRL. ENT      Head:   Normocephalic and atraumatic.      Nose:   Wearing a mask.      Mouth/Throat:   Wearing a mask.      Neck:   No meningismus. Full ROM. Hematological/Lymphatic/Immunilogical:   No cervical lymphadenopathy. Cardiovascular:   RRR. Symmetric bilateral radial and DP pulses.  No murmurs. Cap refill less than 2 seconds. Respiratory:   Normal respiratory effort without tachypnea/retractions. Breath sounds are clear and equal bilaterally. No wheezes/rales/rhonchi.  No inducible wheezing or cough with FEV1 maneuver Gastrointestinal:   Soft and nontender. Non distended. There is no CVA tenderness.  No rebound, rigidity, or guarding. Genitourinary:   deferred Musculoskeletal:   Normal range of motion in all extremities.   No lower extremity tenderness.  No edema.  No calf tenderness.  Symmetric calf circumference.  There is tenderness in the upper back paraspinous musculature in the area of T4.  No midline point tenderness.  This reproduces her pain. Neurologic:   Normal speech and language.  Motor grossly intact. No acute focal neurologic deficits are appreciated.  Skin:    Skin is warm, dry and intact. No rash noted.  No petechiae, purpura, or  bullae.  ____________________________________________    LABS (pertinent positives/negatives) (all labs ordered are listed, but only abnormal results are displayed) Labs Reviewed  BASIC METABOLIC PANEL - Abnormal; Notable for the following components:      Result Value   CO2 21 (*)    Glucose, Bld 121 (*)    All other components within normal limits  CBC  TROPONIN I (HIGH SENSITIVITY)  TROPONIN I (HIGH SENSITIVITY)   ____________________________________________   EKG  Interpreted by me Normal sinus rhythm rate of 69, left axis, normal intervals.  Poor R wave progression.  Normal ST segments and T waves.  No ischemic changes.  ____________________________________________    [id number]  DG Chest 2 View  Result Date: 08/17/2020 CLINICAL DATA:  Shortness of breath EXAM: CHEST - 2 VIEW COMPARISON:  June 27, 2019 chest radiograph and chest CT FINDINGS: No edema or airspace opacity. Heart size and pulmonary vascularity are normal. No adenopathy. No appreciable bone lesions. IMPRESSION: No edema or airspace opacity. Cardiac silhouette within normal limits. Electronically Signed   By: Lowella Grip III M.D.   On: 08/17/2020 15:13   CT Angio Chest PE W and/or Wo Contrast  Result Date: 08/17/2020 CLINICAL DATA:  Nonproductive cough and chest pain. EXAM: CT ANGIOGRAPHY CHEST WITH CONTRAST TECHNIQUE: Multidetector CT imaging of the chest was performed using the standard protocol during bolus administration of intravenous contrast. Multiplanar CT image reconstructions and MIPs were obtained to evaluate the vascular anatomy. CONTRAST:  157mL OMNIPAQUE IOHEXOL 350 MG/ML SOLN COMPARISON:  June 27, 2019 FINDINGS: Cardiovascular: Satisfactory opacification of the pulmonary arteries to the segmental level. No evidence of pulmonary embolism. There is mild cardiomegaly. No pericardial effusion. Mediastinum/Nodes: No enlarged mediastinal, hilar, or axillary lymph nodes. Thyroid gland, trachea, and  esophagus demonstrate no significant findings. Lungs/Pleura: Very mild atelectasis is noted within the bilateral lung bases. There is no evidence of acute infiltrate, pleural effusion or pneumothorax. Upper Abdomen: There is a stable moderate-sized hiatal hernia. Multiple surgical clips are seen within the gallbladder fossa the with a mild amount of pneumobilia noted. Musculoskeletal: Degenerative changes are noted throughout the thoracic spine. Review of the MIP images confirms the above findings. IMPRESSION: 1. No evidence of pulmonary embolus or other acute intrathoracic process. 2. Stable moderate-sized hiatal hernia. 3. Evidence of prior cholecystectomy with mild pneumobilia. Electronically Signed   By: Virgina Norfolk M.D.   On: 08/17/2020 20:07    ____________________________________________   PROCEDURES Procedures  ____________________________________________  DIFFERENTIAL DIAGNOSIS   Non-STEMI, pulmonary embolism, pneumonia, pneumothorax, spinal compression fracture, herniated thoracic intervertebral disc, musculoskeletal pain  CLINICAL IMPRESSION / ASSESSMENT AND PLAN / ED COURSE  Medications ordered in the ED: Medications  iohexol (OMNIPAQUE) 350 MG/ML injection 100 mL (100 mLs Intravenous Contrast Given 08/17/20 1947)    Pertinent labs & imaging results that were available during my care of the patient were reviewed by me and considered in my medical decision making (see chart for details).  MEGHIN THIVIERGE was evaluated in Emergency Department on 08/17/2020 for the symptoms described in the history of present illness. She was evaluated in the context of the global COVID-19 pandemic, which necessitated consideration that the patient might be at risk for infection with the SARS-CoV-2 virus that causes COVID-19. Institutional protocols and algorithms that pertain to the evaluation of patients at risk for COVID-19 are in a state of rapid change based on information released by  regulatory bodies including the CDC and federal and state organizations. These policies and algorithms were followed during the patient's care in the ED.   Patient presents with upper back pain which is reproducible on exam.  However with abrupt  onset and her history of a saddle pulmonary embolism, not currently on anticoagulation, CT angiogram is necessary to rule out PE.  Thankfully this is negative.  No apparent intrathoracic pathology.  Serial troponins and labs are normal.  No evidence of compression fracture or herniated disc.  Will treat as musculoskeletal pain, lidocaine patch, heat therapy.      ____________________________________________   FINAL CLINICAL IMPRESSION(S) / ED DIAGNOSES    Final diagnoses:  Acute bilateral thoracic back pain     ED Discharge Orders         Ordered    lidocaine (LIDODERM) 5 %  Every 12 hours        08/17/20 2046          Portions of this note were generated with dragon dictation software. Dictation errors may occur despite best attempts at proofreading.   Carrie Mew, MD 08/17/20 2053

## 2020-08-18 ENCOUNTER — Encounter: Payer: Self-pay | Admitting: *Deleted

## 2020-08-18 ENCOUNTER — Encounter: Payer: Self-pay | Admitting: Family Medicine

## 2020-08-18 NOTE — Telephone Encounter (Signed)
Noted Reviewed RN Hildred Alamin note and epic ED note 08/17/2020 patient had cxr, CT scan and labs negative for PE/DVT/pneumonia and diagnosed with acute thoracic pain.  Will follow up tomorrow via telephone with patient.

## 2020-08-19 NOTE — Telephone Encounter (Signed)
Patient contacted via telephone.  She reported pain very troubling.  She feels back problems started when going for pelvic US for ovarian cyst and technician asking her to get into odd positions that were difficult to hold.  Initially pain right between her shoulder blades and she had to take oxycodone today that was left over from her hip surgery.  Didn't help much went from 9/10 to a 6/10 only.  Radiating pain shooting down right side today.  Initially only across shoulder blades into her right side.  It felt like pneumonia symptoms initially.  Patient confirmed no PE/blood clots seen on scan and was at ER 7 hours.  Complimentary towards staff checking on her and keeping her informed during her time there.  She is supposed to see orthopedics Monday for possible epidural injection.  Taking advil also.  Tylenol never helps.  Can't get into a tub to do epsom salt soaks discussed wetting hand towel or towel with epsom salt water and placing on her back.  Discussed topical icy hot/biofreeze/capsaicin, stretching.  Patient has not tried and not open to trying at this time.  Tried robaxin didn't help.  Never picked up zanaflex rx.  Discussed sometimes one will work when another will not.  Patient reported husband can feel lump on her back today right at level of her scoliosis.  Patient was Rx'd lidocaine patches and they have helped minimally.  Denied trouble urinating/stooling and denied accidents/incontinence.  Cannot work with pain at this time.  Sent my chart message to her orthopedist regarding appt tomorrow yesterday and hasn't received reply yet.  Discussed to call RN Hildred Alamin in clinic tomorrow [phone] if she needs assistance getting orthopedics appt.  Patient reported she has used all her FMLA and concerned tomorrow will be 3 days out of work.  Discussed I cannot write her work restrictions her PCM or orthopedics provider would need to do that.  I also cannot Rx for controlled substances due to contract limitations.   Patient A&Ox3 spoke full sentences without difficulty.  Patient verbalized understanding information/instructions, agreed with plan of care and had no further questions at this time.  HR notified personal medical awaiting follow up with specialist unable to work at this time.

## 2020-08-20 ENCOUNTER — Ambulatory Visit (INDEPENDENT_AMBULATORY_CARE_PROVIDER_SITE_OTHER): Payer: PRIVATE HEALTH INSURANCE | Admitting: Family Medicine

## 2020-08-20 ENCOUNTER — Other Ambulatory Visit: Payer: Self-pay

## 2020-08-20 DIAGNOSIS — M546 Pain in thoracic spine: Secondary | ICD-10-CM | POA: Diagnosis not present

## 2020-08-20 MED ORDER — OXYCODONE-ACETAMINOPHEN 5-325 MG PO TABS: 1.0000 | ORAL_TABLET | Freq: Four times a day (QID) | ORAL | 0 refills | Status: DC | PRN

## 2020-08-20 NOTE — Telephone Encounter (Signed)
Pt called in to clinic reporting that she is seeing her pcp this afternoon at 3p for ER follow up. She relayed the info about her ER visit and Dx possible bulging disc in back causing her pain. She is glad it is not pneumonia. Pain is still bothering her. She asked about a work note as she has now been out of work for 3 days and I recommended she request one from pcp in case she needs any restrictions while at work. She verbalizes understanding and agreement. Denies further needs at this time. Cleared by clinic to RTW upon clearance by pcp.

## 2020-08-20 NOTE — Progress Notes (Signed)
Office Visit Note   Patient: [name]           Date of Birth: Nov 15, 1950           MRN: [id number] Visit Date: 08/20/2020 Requested by: Lavada Mesi, MD [address] [place],  [place] [address] PCP: Lavada Mesi, MD  Subjective: Chief Complaint  Patient presents with  . Middle Back - Pain    Pain between the shoulder blades - started last week. Now having a shooting pain going down to the right lower back.     HPI: She is here with back pain.  Last week she went to the ER with back pain.  She was concerned that she might have pneumonia.  Work-up was negative for pneumonia or pulmonary embolus.  She is feeling better but still having quite a bit of pain.  She does note that prior to the onset of her pain, she had to go through gynecologic ultrasound imaging and she thinks the positioning might have triggered the onset of her back pain.              ROS:   All other systems were reviewed and are negative.  Objective: Vital Signs: There were no vitals taken for this visit.  Physical Exam:  General:  Alert and oriented, in no acute distress. Pulm:  Breathing unlabored. Psy:  Normal mood, congruent affect. Skin: No rash Back: She has very tender paraspinous muscles in the mid thoracic spine.  Palpation of the muscles reproduces her pain.  Imaging: No results found.  Assessment & Plan: 1.  Myofascial back pain -Deep tissue massage using a tennis ball at home.  Over-the-counter medicines as needed, including Voltaren gel. -Physical therapy if symptoms persist.  Out of work until May 5.     Procedures: No procedures performed        PMFS History: Patient Active Problem List   Diagnosis Date Noted  . Cyst of left ovary 07/27/2020  . Status post total replacement of left hip 02/17/2020  . Unilateral primary osteoarthritis, left hip 02/16/2020  . Diarrhea   . B12 deficiency 01/08/2018  . Vitamin D deficiency 01/08/2018  . History of pulmonary embolism  01/08/2018  . Status post total left knee replacement 10/13/2017  . Family history of thyroid disease 08/25/2017  . Chronic pain of left knee 05/20/2017  . Unilateral primary osteoarthritis, left knee 05/20/2017  . Morbid obesity (HCC) 04/30/2017  . Family history of ovarian cancer 02/17/2017  . IBS (irritable bowel syndrome) 01/21/2017  . Dyspnea 10/08/2016  . Paroxysmal atrial fibrillation (HCC) 06/18/2016  . Type 2 diabetes mellitus without complication, without long-term current use of insulin (HCC) 01/21/2016  . H/O Spinal surgery 12/21/2015  . Barrett's esophagus 12/21/2015  . Hx of adenomatous polyp of colon 03/01/2002   Past Medical History:  Diagnosis Date  . Abnormal thyroid function test   . Allergy   . Anemia    she attributes previously to fibroids, she declines  . Barrett's esophagus   . Blood transfusion without reported diagnosis   . DDD (degenerative disc disease), cervical   . DJD (degenerative joint disease)   . Dysrhythmia   . GERD (gastroesophageal reflux disease)   . Heart murmur   . History of bronchitis   . History of hiatal hernia   . Hx of adenomatous polyp of colon 03/01/2002  . Myocardial infarction (HCC)   . OA (osteoarthritis)   . Obese   . Paroxysmal atrial fibrillation (HCC)   .  PE (pulmonary embolism) 12/21/2015  . Plantar fasciitis   . Pneumonia   . PONV (postoperative nausea and vomiting)    pt. reports that she woke up during surgery  . Pre-diabetes   . Pulmonary embolism (HCC)     Family History  Problem Relation Age of Onset  . Stroke Maternal Grandmother   . Bladder Cancer Maternal Grandmother 50  . Esophageal cancer Maternal Grandmother 50  . Hyperthyroidism Mother        Radioactive Iodine Treatment  . Hypothyroidism Mother   . Cervical cancer Maternal Aunt 50  . Ovarian cancer Daughter 63  . Colon cancer Neg Hx   . Pancreatic cancer Neg Hx   . Rectal cancer Neg Hx   . Stomach cancer Neg Hx   . Diabetes Neg Hx      Past Surgical History:  Procedure Laterality Date  . ABDOMINAL HYSTERECTOMY    . Arthroscopic surgery left knee    . BACK SURGERY     2005 ,  2017  . BIOPSY  07/26/2019   Procedure: BIOPSY;  Surgeon: Iva Boop, MD;  Location: [facility];  Service: Endoscopy;;  . CESAREAN SECTION    . CHOLECYSTECTOMY    . COLONOSCOPY  2003, 2011   3 mm adenoma 2011  . COLONOSCOPY WITH PROPOFOL N/A 07/26/2019   Procedure: COLONOSCOPY WITH PROPOFOL;  Surgeon: Iva Boop, MD;  Location: [facility];  Service: Endoscopy;  Laterality: N/A;  . ESOPHAGOGASTRODUODENOSCOPY  multiple since 2003  . ESOPHAGOGASTRODUODENOSCOPY (EGD) WITH PROPOFOL N/A 07/26/2019   Procedure: ESOPHAGOGASTRODUODENOSCOPY (EGD) WITH PROPOFOL;  Surgeon: Iva Boop, MD;  Location: [facility];  Service: Endoscopy;  Laterality: N/A;  . IR GENERIC HISTORICAL  12/19/2015   IR ANGIOGRAM SELECTIVE EACH ADDITIONAL VESSEL 12/19/2015 [name], MD [facility]  . IR GENERIC HISTORICAL  12/19/2015   IR ANGIOGRAM PULMONARY BILATERAL SELECTIVE 12/19/2015 [name], MD [facility]  . IR GENERIC HISTORICAL  12/19/2015   IR INFUSION THROMBOL ARTERIAL INITIAL (MS) 12/19/2015 [name], MD [facility]  . IR GENERIC HISTORICAL  12/19/2015   IR US GUIDE VASC ACCESS RIGHT 12/19/2015 [name], MD [facility]  . IR GENERIC HISTORICAL  12/19/2015   IR ANGIOGRAM SELECTIVE EACH ADDITIONAL VESSEL 12/19/2015 [name], MD [facility]  . IR GENERIC HISTORICAL  12/20/2015   IR THROMB F/U EVAL ART/VEN FINAL DAY (MS) 12/20/2015 Gilmer Mor, DO [facility]  . IR GENERIC HISTORICAL  12/19/2015   IR INFUSION THROMBOL ARTERIAL INITIAL (MS) 12/19/2015 [name], MD [facility]  . PLANTAR FASCIA RELEASE     right  . POLYPECTOMY  07/26/2019   Procedure: POLYPECTOMY;  Surgeon: Iva Boop, MD;  Location: [facility];  Service: Endoscopy;;  . Right knee replacement    . SPINAL FUSION  10/31/2015   revision at [facility]    . TONSILLECTOMY    . TOTAL HIP ARTHROPLASTY Left 02/17/2020   Procedure: LEFT TOTAL HIP ARTHROPLASTY ANTERIOR APPROACH;  Surgeon: Kathryne Hitch, MD;  Location: [facility];  Service: Orthopedics;  Laterality: Left;  . TOTAL KNEE ARTHROPLASTY Left 10/13/2017   Procedure: LEFT TOTAL KNEE ARTHROPLASTY;  Surgeon: Kathryne Hitch, MD;  Location: [facility];  Service: Orthopedics;  Laterality: Left;  . ULNAR TUNNEL RELEASE     right  . VENOUS ABLATION     x 2   Social History   Occupational History    Comment: Telephone sales  Tobacco Use  . Smoking status: Never Smoker  .  Smokeless tobacco: Never Used  Vaping Use  . Vaping Use: Never used  Substance and Sexual Activity  . Alcohol use: No  . Drug use: No  . Sexual activity: Not Currently    Partners: Male    Birth control/protection: Surgical    Comment: Hysterectomy

## 2020-08-21 NOTE — Telephone Encounter (Signed)
Chart review shows pt saw pcp yesterday as planned. Dx back pain. Use deep tissue massage with tennis ball at home. OTC meds including Voltaren gel. PT if sx persist. Out of work until 5/5. Gave Percocet 5/325 po q6h prn #20. Advised HR of personal medical and pending work note. Closing encounter for clinic since not covid related.

## 2020-08-21 NOTE — Telephone Encounter (Signed)
Reviewed epic orthopedic note and RN Hildred Alamin note.  Agreed with plan of care and patient to follow up with orthopedics if requires further work restrictions on controlled substances Rx.

## 2020-08-30 ENCOUNTER — Other Ambulatory Visit: Payer: Self-pay

## 2020-08-30 ENCOUNTER — Ambulatory Visit (INDEPENDENT_AMBULATORY_CARE_PROVIDER_SITE_OTHER): Payer: PRIVATE HEALTH INSURANCE | Admitting: Family Medicine

## 2020-08-30 ENCOUNTER — Telehealth: Payer: Self-pay | Admitting: Orthopaedic Surgery

## 2020-08-30 ENCOUNTER — Encounter: Payer: Self-pay | Admitting: Orthopaedic Surgery

## 2020-08-30 ENCOUNTER — Encounter: Payer: Self-pay | Admitting: Family Medicine

## 2020-08-30 VITALS — BP 135/83 | HR 76 | Wt 250.0 lb

## 2020-08-30 DIAGNOSIS — N83201 Unspecified ovarian cyst, right side: Secondary | ICD-10-CM

## 2020-08-30 NOTE — Telephone Encounter (Signed)
Received $25.00 check and medical records release form from patient     Forwarding to [facility]

## 2020-08-30 NOTE — Progress Notes (Signed)
   Subjective:    Patient ID: [name] is a 70 y.o. female presenting with Follow-up  on 08/30/2020  HPI: Here for f/u imaging. She has h/o ovarian cyst some 40+ years ago and recurring in 2018. At that time, it appeared benign with negative CA 125 and we had IR drain this. She did well and pathology was benign at that time. Approximately 3 years later, she developed pain and imaging suggested a left sided lesion. She is having pain on right with movement. U/s shows similar sized simple appearing right ovarian cyst. CA 125 is again negative.   Review of Systems  Constitutional: Negative for chills and fever.  Respiratory: Negative for shortness of breath.   Cardiovascular: Negative for chest pain.  Gastrointestinal: Negative for abdominal pain, nausea and vomiting.  Genitourinary: Negative for dysuria.  Skin: Negative for rash.      Objective:    BP 135/83   Pulse 76   Wt 250 lb (113.4 kg)   BMI 45.73 kg/m  Physical Exam Constitutional:      General: She is not in acute distress.    Appearance: She is well-developed.  HENT:     Head: Normocephalic and atraumatic.  Eyes:     General: No scleral icterus. Cardiovascular:     Rate and Rhythm: Normal rate.  Pulmonary:     Effort: Pulmonary effort is normal.  Abdominal:     Palpations: Abdomen is soft.  Musculoskeletal:     Cervical back: Neck supple.  Skin:    General: Skin is warm and dry.  Neurological:     Mental Status: She is alert and oriented to person, place, and time.         Assessment & Plan:   Problem List Items Addressed This Visit      Unprioritized   Right ovarian cyst - Primary    Given h/o vertival midline, prior lap chole, prior pfannenstiel, and probable scar tissue and most likely benign cyst, will refer for drainage again and see if we can put something in to stop it from re-accumulating. Treating based on pain. Normal CA 125 and simple appearing.      Relevant Orders   Ambulatory  referral to Interventional Radiology      Return in about 6 weeks (around 10/11/2020).  Donnamae Jude 08/30/2020 5:36 PM

## 2020-08-30 NOTE — Patient Instructions (Signed)
Ovarian Cyst  An ovarian cyst is a fluid-filled sac on an ovary. Most of these cysts go away on their own and are not cancer. Some cysts need treatment. What are the causes?  Ovarian hyperstimulation syndrome. Some medicines may lead to this problem.  Polycystic ovarian syndrome (PCOS). Problems with body chemicals (hormones) can lead to this condition.  The normal menstrual cycle. What increases the risk?  Being overweight or very overweight.  Taking medicines to increase your chance of getting pregnant.  Using some types of birth control.  Smoking. What are the signs or symptoms? Many ovarian cysts do not cause symptoms. If you get symptoms, you may have:  Pain or pressure in the area between the hip bones.  Pain in the lower belly.  Pain during sex.  Swelling in the lower belly.  Periods that are not regular.  Pain with periods. How is this treated? Many ovarian cysts go away on their own without treatment. If you need treatment, it may include:  Medicines for pain.  Fluid taken out of the cyst.  The cyst being taken out.  Birth control pills or other medicines.  Surgery to remove the ovary. Follow these instructions at home:  Take over-the-counter and prescription medicines only as told by your doctor.  Ask your doctor if you should avoid driving or using machines while you are taking your medicine.  Get exams and Pap tests as told by your doctor.  Return to your normal activities when your doctor says that it is safe.  Do not smoke or use any products that contain nicotine or tobacco. If you need help quitting, ask your doctor.  Keep all follow-up visits. Contact a doctor if:  Your periods: ? Are late. ? Are not regular. ? Stop. ? Are painful.  You have pain in the area between your hip bones, and the pain does not go away.  You feel pressure on your bladder.  You have trouble peeing.  You feel full, or your belly hurts, swells, or  bloats.  You gain or lose weight without trying, or you are less hungry than normal.  You feel pain and pressure in your back.  You feel pain and pressure in the area between your hip bones.  You think you may be pregnant. Get help right away if:  You have pain in your belly that is very bad or gets worse.  You have pain in the area between your hip bones, and the pain is very bad or gets worse.  You cannot eat or drink without vomiting.  You get a fever or chills all of a sudden.  Your period is a lot heavier than usual. Summary  An ovarian cyst is a fluid-filled sac on an ovary.  Some cysts may cause problems and need treatment.  Most of these cysts go away on their own. This information is not intended to replace advice given to you by your health care provider. Make sure you discuss any questions you have with your health care provider. Document Revised: 09/15/2019 Document Reviewed: 09/15/2019 Elsevier Patient Education  2021 Elsevier Inc.  

## 2020-08-30 NOTE — Assessment & Plan Note (Signed)
Given h/o vertival midline, prior lap chole, prior pfannenstiel, and probable scar tissue and most likely benign cyst, will refer for drainage again and see if we can put something in to stop it from re-accumulating. Treating based on pain. Normal CA 125 and simple appearing.

## 2020-09-04 ENCOUNTER — Other Ambulatory Visit (HOSPITAL_COMMUNITY): Payer: Self-pay | Admitting: Interventional Radiology

## 2020-09-04 ENCOUNTER — Encounter: Payer: Self-pay | Admitting: Family Medicine

## 2020-09-04 ENCOUNTER — Other Ambulatory Visit: Payer: Self-pay | Admitting: Family Medicine

## 2020-09-04 ENCOUNTER — Other Ambulatory Visit: Payer: Self-pay | Admitting: *Deleted

## 2020-09-04 DIAGNOSIS — N83201 Unspecified ovarian cyst, right side: Secondary | ICD-10-CM

## 2020-09-04 MED ORDER — AZITHROMYCIN 250 MG PO TABS: ORAL_TABLET | ORAL | 0 refills | Status: DC

## 2020-09-04 NOTE — Progress Notes (Unsigned)
IR has asked for an MRI--ordering

## 2020-09-05 NOTE — Telephone Encounter (Signed)
Patient contacted via telephone stated her orthopedic provider filled out [place] paperwork for disability due to back keeping her from working full time through 22 April.  Patient reported she returned onsite 26 April.  Still following up with gynecology for ovarian cyst and they are planning cyst drainage but date pending with [facility] procedure scheduling.  Ovarian cyst putting pressure on her bladder and pain if traveling over bumps in car.  Patient now working remote again and does not have a date for next onsite shift. Patient reported after dropping disability paperwork off at orthopedics office she started to have diarrhea this weekend.   picked up a bug when in their busy office I think.  Discussed with patient viral gastroenteritis is circulating in the community.  Patient tolerating po intake without difficulty and symptoms improving. Patient also contacted PCM yesterday regarding sinus infection and ear pain.  Nasal saline and flonase weren't helping and was started on azithromycin.  Patient reported now having yellow green drainage and ear/sinus pain improving.  Discussed continuing nasal saline 2 sprays each nostril q2h prn congestion/thick mucous to help with inflammation and thinning mucous to expectorate it.  Patient to follow up with gynecology/orthopedics/PCM as needed.  Back pain continues but not interfering with work per patient.  Discussed ovarian cyst may be putting pressure on back nerve or changing posture and why pain not resolving with usual treatment.  Patient stated she will be following up with gynecology as procedure pending.  Patient had no further questions or concerns at this time.  Patient A&Ox3 spoke full sentences without difficulty  Duration of call 8 minutes.

## 2020-09-06 ENCOUNTER — Encounter (HOSPITAL_COMMUNITY): Payer: Self-pay

## 2020-09-06 NOTE — Progress Notes (Signed)
MM    2       [name] Female, 70 y.o., 04-23-1950  MRN:  [id number] Phone:  [phone] Jerilynn Mages)       PCP:  Eunice Blase, MD Primary Cvg:  [facility]/[employer]  Next Appt With Radiology ([facility]) 09/15/2020 at 1:00 PM           RE: Aspiration Received: 2 days ago  Message Details  Sandi Mariscal, MD  Avis Epley, MD Dr. Kennon Rounds.   I see that we aspirated this right sided ovarian cyst on 04/03/17.   If you'd like for [place] to attempt this procedure again, I would recommend first obtaining a contrast enhanced pelvic MRI, both to help characterize the presumed cyst as well as for procedural planning purposes for potential image guided aspiration.   Thx,  Ronny Bacon    Previous Messages  ----- Message -----  From: Lenore Cordia  Sent: 09/04/2020 10:53 AM EDT  To: Ir Procedure Requests  Subject: Aspiration                    Procedure Requested: US guided image by percutaneous catheter    Reason for Procedure: ovarain cyst, aspiration of cyst    Provider Requesting: Darron Doom  Provider Telephone: [phone]    Other Info:

## 2020-09-09 NOTE — Telephone Encounter (Signed)
Message left for patient calling to check in if her symptoms resolved and feeling better.

## 2020-09-10 ENCOUNTER — Telehealth: Payer: Self-pay | Admitting: Family Medicine

## 2020-09-10 NOTE — Telephone Encounter (Signed)
Telephone call to patient regarding upcoming MRI.  I just received insurance authorization.  Left message with patient to call me for appointment details.

## 2020-09-12 NOTE — Telephone Encounter (Signed)
Noted in Epic MRI scheduled 09/15/20

## 2020-09-13 ENCOUNTER — Other Ambulatory Visit: Payer: Self-pay | Admitting: Family Medicine

## 2020-09-14 MED ORDER — DIPHENOXYLATE-ATROPINE 2.5-0.025 MG PO TABS: 1.0000 | ORAL_TABLET | Freq: Four times a day (QID) | ORAL | 0 refills | Status: DC | PRN

## 2020-09-15 ENCOUNTER — Ambulatory Visit (HOSPITAL_COMMUNITY)
Admission: RE | Admit: 2020-09-15 | Discharge: 2020-09-15 | Disposition: A | Discharge status: Home / Self Care | Payer: No Typology Code available for payment source | Source: Ambulatory Visit | Attending: Family Medicine | Admitting: Family Medicine

## 2020-09-15 ENCOUNTER — Other Ambulatory Visit: Payer: Self-pay

## 2020-09-15 DIAGNOSIS — N83201 Unspecified ovarian cyst, right side: Secondary | ICD-10-CM | POA: Insufficient documentation

## 2020-09-15 MED ORDER — GADOBUTROL 1 MMOL/ML IV SOLN
10.0000 mL | Freq: Once | INTRAVENOUS | Status: AC | PRN
  Administered 2020-09-15: 10 mL via INTRAVENOUS

## 2020-09-18 ENCOUNTER — Other Ambulatory Visit: Payer: Self-pay

## 2020-09-18 ENCOUNTER — Other Ambulatory Visit: Payer: Self-pay | Admitting: Family Medicine

## 2020-09-18 DIAGNOSIS — N83201 Unspecified ovarian cyst, right side: Secondary | ICD-10-CM

## 2020-09-18 NOTE — Telephone Encounter (Signed)
MRI showed simple 5 cm ovarian cyst and patient following up with her provider.

## 2020-09-19 ENCOUNTER — Other Ambulatory Visit: Payer: Self-pay | Admitting: Interventional Radiology

## 2020-09-19 ENCOUNTER — Ambulatory Visit
Admission: RE | Admit: 2020-09-19 | Discharge: 2020-09-19 | Disposition: A | Discharge status: Home / Self Care | Payer: PRIVATE HEALTH INSURANCE | Source: Ambulatory Visit | Attending: Family Medicine | Admitting: Family Medicine

## 2020-09-19 ENCOUNTER — Other Ambulatory Visit: Payer: Self-pay

## 2020-09-19 ENCOUNTER — Encounter: Payer: Self-pay | Admitting: *Deleted

## 2020-09-19 DIAGNOSIS — N83201 Unspecified ovarian cyst, right side: Secondary | ICD-10-CM

## 2020-09-19 HISTORY — PX: IR RADIOLOGIST EVAL & MGMT: IMG5224

## 2020-09-19 NOTE — Consult Note (Signed)
Chief Complaint: Patient was consulted remotely today (TeleHealth) for pelvic pain related to pelvic cyst at the request of Pratt,Tanya S.    Referring Physician(s): Pratt,Tanya S  History of Present Illness: [name] is a 70 y.o. female with history of right pelvic cyst.  Her cyst was drained in IR on 04/03/2017.  She was experiencing intermittent severe sharp pelvic pain at that time.  Her pain significantly improved status post aspiration.  Her pain has returned in the last 3 months.  She initially attributed the symptoms to recent hip replacement.  However the pain has continued and repeat pelvic MRI of the pelvis shows reaccumulation of the right pelvic cyst.   She denies unintentional weight loss, fever, and chills.  She feels generally well with the exception of the intermittent sharp severe pelvic pain.    Past Medical History:  Diagnosis Date  . Abnormal thyroid function test   . Allergy   . Anemia    she attributes previously to fibroids, she declines  . Barrett's esophagus   . Blood transfusion without reported diagnosis   . DDD (degenerative disc disease), cervical   . DJD (degenerative joint disease)   . Dysrhythmia   . GERD (gastroesophageal reflux disease)   . Heart murmur   . History of bronchitis   . History of hiatal hernia   . Hx of adenomatous polyp of colon 03/01/2002  . Myocardial infarction ([place])   . OA (osteoarthritis)   . Obese   . Paroxysmal atrial fibrillation ([facility])   . PE (pulmonary embolism) 12/21/2015  . Plantar fasciitis   . Pneumonia   . PONV (postoperative nausea and vomiting)    pt. reports that she woke up during surgery  . Pre-diabetes   . Pulmonary embolism [facility])     Past Surgical History:  Procedure Laterality Date  . ABDOMINAL HYSTERECTOMY    . Arthroscopic surgery left knee    . BACK SURGERY     2005 ,  2017  . BIOPSY  07/26/2019   Procedure: BIOPSY;  Surgeon: Gatha Mayer, MD;  Location: [facility];   Service: Endoscopy;;  . CESAREAN SECTION    . CHOLECYSTECTOMY    . COLONOSCOPY  2003, 2011   3 mm adenoma 2011  . COLONOSCOPY WITH PROPOFOL N/A 07/26/2019   Procedure: COLONOSCOPY WITH PROPOFOL;  Surgeon: Gatha Mayer, MD;  Location: [facility];  Service: Endoscopy;  Laterality: N/A;  . ESOPHAGOGASTRODUODENOSCOPY  multiple since 2003  . ESOPHAGOGASTRODUODENOSCOPY (EGD) WITH PROPOFOL N/A 07/26/2019   Procedure: ESOPHAGOGASTRODUODENOSCOPY (EGD) WITH PROPOFOL;  Surgeon: Gatha Mayer, MD;  Location: [facility];  Service: Endoscopy;  Laterality: N/A;  . IR GENERIC HISTORICAL  12/19/2015   IR ANGIOGRAM SELECTIVE EACH ADDITIONAL VESSEL 12/19/2015 Greggory Keen, MD [facility]  . IR GENERIC HISTORICAL  12/19/2015   IR ANGIOGRAM PULMONARY BILATERAL SELECTIVE 12/19/2015 Greggory Keen, MD [facility]  . IR GENERIC HISTORICAL  12/19/2015   IR INFUSION THROMBOL ARTERIAL INITIAL (MS) 12/19/2015 Greggory Keen, MD [facility]  . IR GENERIC HISTORICAL  12/19/2015   IR US GUIDE VASC ACCESS RIGHT 12/19/2015 Greggory Keen, MD [facility]  . IR GENERIC HISTORICAL  12/19/2015   IR ANGIOGRAM SELECTIVE EACH ADDITIONAL VESSEL 12/19/2015 Greggory Keen, MD [facility]  . IR GENERIC HISTORICAL  12/20/2015   IR THROMB F/U EVAL ART/VEN FINAL DAY (MS) 12/20/2015 Corrie Mckusick, DO [facility]  . IR GENERIC HISTORICAL  12/19/2015   IR INFUSION THROMBOL ARTERIAL INITIAL (MS) 12/19/2015 Legrand Como  Annamaria Boots, MD [facility]  . IR RADIOLOGIST EVAL & MGMT  09/19/2020  . PLANTAR FASCIA RELEASE     right  . POLYPECTOMY  07/26/2019   Procedure: POLYPECTOMY;  Surgeon: Gatha Mayer, MD;  Location: [facility];  Service: Endoscopy;;  . Right knee replacement    . SPINAL FUSION  10/31/2015   revision at [place]    . TOTAL HIP ARTHROPLASTY Left 02/17/2020   Procedure: LEFT TOTAL HIP ARTHROPLASTY ANTERIOR APPROACH;  Surgeon: Mcarthur Rossetti, MD;  Location: [facility];  Service: Orthopedics;   Laterality: Left;  . TOTAL KNEE ARTHROPLASTY Left 10/13/2017   Procedure: LEFT TOTAL KNEE ARTHROPLASTY;  Surgeon: Mcarthur Rossetti, MD;  Location: [place] [facility];  Service: Orthopedics;  Laterality: Left;  . ULNAR TUNNEL RELEASE     right  . VENOUS ABLATION     x 2    Allergies: Ciprofloxacin, Doxycycline, Metoprolol, Strawberry extract, and Tetracyclines & related  Medications: Prior to Admission medications   Medication Sig Start Date End Date Taking? Authorizing Provider  azithromycin (ZITHROMAX) 250 MG tablet 2 PO x 1 day, then 1 PO qd x 4 more days. 09/04/20   Hilts, Legrand Como, MD  diphenoxylate-atropine (LOMOTIL) 2.5-0.025 MG tablet Take 1 tablet by mouth 4 (four) times daily as needed for diarrhea or loose stools. 09/14/20   Hilts, Legrand Como, MD  ELIQUIS 5 MG TABS tablet TAKE 1 TABLET BY MOUTH TWICE A DAY Patient not taking: No sig reported 04/09/20   Collene Gobble, MD  ibuprofen (ADVIL) 200 MG tablet Take 200 mg by mouth every 4 (four) hours as needed for moderate pain or mild pain.    [provider]  lidocaine (LIDODERM) 5 % Place 1 patch onto the skin every 12 (twelve) hours. Remove & Discard patch within 12 hours or as directed by MD 08/17/20   Carrie Mew, MD  methocarbamol (ROBAXIN) 500 MG tablet Take 1 tablet (500 mg total) by mouth every 6 (six) hours as needed for muscle spasms. Patient not taking: No sig reported 02/21/20   Mcarthur Rossetti, MD  Multiple Vitamin (MULTIVITAMIN WITH MINERALS) TABS tablet Take 1 tablet by mouth daily.    [provider]  oxyCODONE (OXY IR/ROXICODONE) 5 MG immediate release tablet Take 1-2 tablets (5-10 mg total) by mouth every 4 (four) hours as needed for moderate pain (pain score 4-6). 04/05/20   Pete Pelt, PA-C  oxyCODONE-acetaminophen (PERCOCET/ROXICET) 5-325 MG tablet Take 1 tablet by mouth every 6 (six) hours as needed for severe pain. 08/20/20   Hilts, Legrand Como, MD  tizanidine (ZANAFLEX) 2 MG capsule Take 1  capsule (2 mg total) by mouth 3 (three) times daily. Patient not taking: No sig reported 05/03/20   Pete Pelt, PA-C     Family History  Problem Relation Age of Onset  . Stroke Maternal Grandmother   . Bladder Cancer Maternal Grandmother 78  . Esophageal cancer Maternal Grandmother 4  . Hyperthyroidism Mother        Radioactive Iodine Treatment  . Hypothyroidism Mother   . Cervical cancer Maternal Aunt 50  . Ovarian cancer Daughter 48  . Colon cancer Neg Hx   . Pancreatic cancer Neg Hx   . Rectal cancer Neg Hx   . Stomach cancer Neg Hx   . Diabetes Neg Hx     Social History   Socioeconomic History  . Marital status: Married    Spouse name: Not on file  . Number of  children: 3  . Years of education: Not on file  . Highest education level: Not on file  Occupational History    Comment: Telephone sales  Tobacco Use  . Smoking status: Never Smoker  . Smokeless tobacco: Never Used  Vaping Use  . Vaping Use: Never used  Substance and Sexual Activity  . Alcohol use: No  . Drug use: No  . Sexual activity: Not Currently    Partners: Male    Birth control/protection: Surgical    Comment: Hysterectomy  Other Topics Concern  . Not on file  Social History Narrative   Married.   3 children, 2 grandchildren.   Work's in a call center.   Enjoys reading, sewing, spending time with her grandchildren.   Social Determinants of Health   Financial Resource Strain: Not on file  Food Insecurity: Not on file  Transportation Needs: Not on file  Physical Activity: Not on file  Stress: Not on file  Social Connections: Not on file   Review of Systems  Review of Systems: A 12 point ROS discussed and pertinent positives are indicated in the HPI above.  All other systems are negative.  Physical Exam No direct physical exam was performed   Vital Signs: There were no vitals taken for this visit.  Imaging: MR PELVIS W WO CONTRAST  Result Date: 09/17/2020 CLINICAL DATA:   Adnexal mass on ultrasound EXAM: MRI PELVIS WITHOUT AND WITH CONTRAST TECHNIQUE: Multiplanar multisequence MR imaging of the pelvis was performed both before and after administration of intravenous contrast. CONTRAST:  33mL GADAVIST GADOBUTROL 1 MMOL/ML IV SOLN COMPARISON:  Pelvic ultrasound dated 08/13/2020 FINDINGS: Urinary Tract:  Bladder is within normal limits. Bowel: Mild left colonic diverticulosis, without evidence of diverticulitis. Vascular/Lymphatic: No evidence of aneurysm. No suspicious pelvic lymphadenopathy. Reproductive:  Status post hysterectomy. Left ovary is within normal limits (series 5/image 17). Right ovary is notable for a 4.1 x 5.3 cm simple cystic lesion (series 5/image 15), without enhancing solid component/mural nodularity or internal complexity/septation. Other:  No pelvic ascites. Musculoskeletal: Left hip arthroplasty with associated susceptibility artifact. Postsurgical changes involving the lower lumbar spine, with associated susceptibility artifact. IMPRESSION: 5.3 cm simple right ovarian cystic lesion. Recommend follow-up US in 6-12 months. Note: This recommendation does not apply to premenarchal patients and to those with increased risk (genetic, family history, elevated tumor markers or other high-risk factors) of ovarian cancer. Reference: JACR 2020 Feb; 17(2):248-254 Status post hysterectomy. Electronically Signed   By: Julian Hy M.D.   On: 09/17/2020 09:47   IR Radiologist Eval & Mgmt  Result Date: 09/19/2020 Please refer to notes tab for details about interventional procedure. (Op Note)   Labs:  CBC: Recent Labs    02/06/20 1430 02/18/20 0314 08/17/20 1434  WBC 6.3 12.2* 5.4  HGB 14.0 11.5* 12.5  HCT 42.8 35.2* 37.9  PLT 233 218 219    COAGS: No results for input(s): INR, APTT in the last 8760 hours.  BMP: Recent Labs    02/06/20 1430 02/18/20 0314 08/17/20 1434  NA 142 138 138  K 4.1 4.3 4.0  CL 107 102 108  CO2 26 25 21*  GLUCOSE  106* 139* 121*  BUN 16 15 14   CALCIUM 9.4 8.7* 9.2  CREATININE 0.92 0.86 0.77  GFRNONAA >60 >60 >60    LIVER FUNCTION TESTS: No results for input(s): BILITOT, AST, ALT, ALKPHOS, PROT, ALBUMIN in the last 8760 hours.  TUMOR MARKERS: No results for input(s): AFPTM, CEA, CA199, CHROMGRNA in the last  8760 hours.  Assessment and Plan:  70 year old woman with recurrent severe sharp shooting intermittent pelvic pain, similar to 2018.  She underwent CT-guided aspiration right ovarian cyst at that time which resolved her symptoms.  Her symptoms have recurred in the last 3 months and are likely related to the right ovarian cyst.  I believe she is a good candidate for repeat aspiration of the right ovarian cyst.  I discussed with her that we would do this with moderate sedation and utilizing CT guidance.  The risk of the procedure include bleeding, infection, and possible damage to nearby structures such is bowel and bladder.  I also explained to her that it is possible that no safe window can be identified at the time of procedure.   She understood the risks and benefits of the procedure and wanted to proceed with CT-guided aspiration.   We will schedule her for CT-guided aspiration at the first available appointment.    Thank you for this interesting consult.  I greatly enjoyed meeting [name] and look forward to participating in their care.  A copy of this report was sent to the requesting provider on this date.  Electronically Signed: Paula Libra Yunuen Mordan 09/19/2020, 11:53 AM   I spent a total of  30 Minutes  in remote  clinical consultation, greater than 50% of which was counseling/coordinating care for pelvic pain.    Visit type: Audio only (telephone). Audio (no video) only due to patient's lack of internet/smartphone capability. Alternative for in-person consultation at [facility], [address], [place], [place]. This visit type was conducted due to national  recommendations for restrictions regarding the COVID-19 Pandemic (e.g. social distancing).  This format is felt to be most appropriate for this patient at this time.  All issues noted in this document were discussed and addressed.

## 2020-10-04 ENCOUNTER — Telehealth: Payer: Self-pay

## 2020-10-04 NOTE — Telephone Encounter (Signed)
Dr. Talmage Coin would like a call back from Dr. Ninfa Linden concerning patient's disability claim.  Stated that he has to speak with him to complete her claim.  Cb# [phone].  Please advise.  Thank you

## 2020-10-04 NOTE — Telephone Encounter (Signed)
Please advise 

## 2020-10-09 NOTE — Telephone Encounter (Signed)
I called the doctor back. The pt is back to work full time. All his questions were answered. He was wanting to know what pt was taken out of work prior to surgery and when she returned.

## 2020-10-10 ENCOUNTER — Other Ambulatory Visit: Payer: Self-pay

## 2020-10-10 ENCOUNTER — Encounter: Payer: Self-pay | Admitting: Family Medicine

## 2020-10-10 ENCOUNTER — Ambulatory Visit (INDEPENDENT_AMBULATORY_CARE_PROVIDER_SITE_OTHER): Payer: No Typology Code available for payment source | Admitting: Family Medicine

## 2020-10-10 VITALS — BP 154/95 | HR 79

## 2020-10-10 DIAGNOSIS — N83201 Unspecified ovarian cyst, right side: Secondary | ICD-10-CM

## 2020-10-10 NOTE — Assessment & Plan Note (Signed)
To f/u with IR for definitive drainage.

## 2020-10-10 NOTE — Progress Notes (Signed)
   Subjective:    Patient ID: [name] is a 70 y.o. female presenting with Follow-up  on 10/10/2020  HPI: Has seen IR. Notes that her symptoms come and go. Usually worse with riding in the car. Hitting a speed bump or being jostled.   Review of Systems  Constitutional:  Negative for chills and fever.  Respiratory:  Negative for shortness of breath.   Cardiovascular:  Negative for chest pain.  Gastrointestinal:  Negative for abdominal pain, nausea and vomiting.  Genitourinary:  Negative for dysuria.  Skin:  Negative for rash.     Objective:    BP (!) 154/95   Pulse 79  Physical Exam Constitutional:      General: She is not in acute distress.    Appearance: She is well-developed.  HENT:     Head: Normocephalic and atraumatic.  Eyes:     General: No scleral icterus. Cardiovascular:     Rate and Rhythm: Normal rate.  Pulmonary:     Effort: Pulmonary effort is normal.  Abdominal:     Palpations: Abdomen is soft.  Musculoskeletal:     Cervical back: Neck supple.  Skin:    General: Skin is warm and dry.  Neurological:     Mental Status: She is alert and oriented to person, place, and time.        Assessment & Plan:   Problem List Items Addressed This Visit       Unprioritized   Right ovarian cyst - Primary    To f/u with IR for definitive drainage.        Return in about 3 months (around 01/10/2021).  Donnamae Jude 10/10/2020 10:24 PM

## 2020-10-24 ENCOUNTER — Other Ambulatory Visit: Payer: Self-pay

## 2020-10-24 ENCOUNTER — Other Ambulatory Visit: Payer: Self-pay | Admitting: Interventional Radiology

## 2020-10-24 ENCOUNTER — Other Ambulatory Visit: Payer: Self-pay | Admitting: Family Medicine

## 2020-10-24 ENCOUNTER — Ambulatory Visit (HOSPITAL_COMMUNITY)
Admission: RE | Admit: 2020-10-24 | Discharge: 2020-10-24 | Disposition: A | Discharge status: Home / Self Care | Payer: No Typology Code available for payment source | Source: Ambulatory Visit | Attending: Interventional Radiology | Admitting: Interventional Radiology

## 2020-10-24 ENCOUNTER — Other Ambulatory Visit: Payer: Self-pay | Admitting: Student

## 2020-10-24 ENCOUNTER — Encounter (HOSPITAL_COMMUNITY): Payer: Self-pay

## 2020-10-24 DIAGNOSIS — N83201 Unspecified ovarian cyst, right side: Secondary | ICD-10-CM

## 2020-10-24 MED ORDER — SODIUM CHLORIDE 0.9 % IV SOLN
INTRAVENOUS | Status: DC
Start: 2020-10-24 — End: 2020-10-25

## 2020-10-24 MED ORDER — LIDOCAINE HCL 1 % IJ SOLN
INTRAMUSCULAR | Status: AC
  Filled 2020-10-24: qty 10

## 2020-10-24 MED ORDER — MIDAZOLAM HCL 2 MG/2ML IJ SOLN
INTRAMUSCULAR | Status: AC | PRN
  Administered 2020-10-24: 1 mg via INTRAVENOUS
  Administered 2020-10-24 (×2): 0.5 mg via INTRAVENOUS

## 2020-10-24 MED ORDER — FENTANYL CITRATE (PF) 100 MCG/2ML IJ SOLN
INTRAMUSCULAR | Status: AC | PRN
  Administered 2020-10-24 (×3): 25 ug via INTRAVENOUS

## 2020-10-24 MED ORDER — DIPHENOXYLATE-ATROPINE 2.5-0.025 MG PO TABS: 1.0000 | ORAL_TABLET | Freq: Four times a day (QID) | ORAL | 0 refills | Status: DC | PRN

## 2020-10-24 MED ORDER — FENTANYL CITRATE (PF) 100 MCG/2ML IJ SOLN
INTRAMUSCULAR | Status: AC
  Filled 2020-10-24: qty 4

## 2020-10-24 MED ORDER — MIDAZOLAM HCL 2 MG/2ML IJ SOLN
INTRAMUSCULAR | Status: AC
  Filled 2020-10-24: qty 4

## 2020-10-24 NOTE — Procedures (Signed)
Interventional Radiology Procedure Note  Procedure: Ovarian cyst aspiration  Indication: Pelvic pain  Findings: Please refer to procedural dictation for full description.  Complications: None  EBL: < 10 mL  Miachel Roux, MD [phone]

## 2020-10-24 NOTE — H&P (Signed)
Chief Complaint: Patient was seen in consultation today for right ovarian cyst  Referring Physician(s): Mignon R  Supervising Physician: Mir, Sharen Heck  Patient Status: [facility] - Out-pt  History of Present Illness: [name] is a 70 y.o. female with past medical history of DDD, GERD, MI, OA, PE in 2017 who has a recurrent right ovarian cyst.  It has been previously drained in IR in 2018 by Dr. Barbie Banner as it was causing pelvic pain and pressure.  Fluid studies were negative for concerning features. This cyst has recurred and is again causing pain. She was recently evaluated by Dr. Dwaine Gale in consultation 09/19/20 to discuss repeat drainage.  Despite likelihood of recurrence, patient requests to proceed for symptom relief.   Patient presents to [facility] Radiology today in her usual state of health.  Reports pelvic pressure and pain.  No pain typically at rest. Denies fever, chills, nausea, vomiting, abdominal pain, hematuria, vaginal discharge, urgency/frequency with urination.  She has been NPO today.  Her husband is planning to provide transportation and care today.   Past Medical History:  Diagnosis Date   Abnormal thyroid function test    Allergy    Anemia    she attributes previously to fibroids, she declines   Barrett's esophagus    Blood transfusion without reported diagnosis    DDD (degenerative disc disease), cervical    DJD (degenerative joint disease)    Dysrhythmia    GERD (gastroesophageal reflux disease)    Heart murmur    History of bronchitis    History of hiatal hernia    Hx of adenomatous polyp of colon 03/01/2002   Myocardial infarction (HCC)    OA (osteoarthritis)    Obese    Paroxysmal atrial fibrillation (HCC)    PE (pulmonary embolism) 12/21/2015   Plantar fasciitis    Pneumonia    PONV (postoperative nausea and vomiting)    pt. reports that she woke up during surgery   Pre-diabetes    Pulmonary embolism [facility])     Past Surgical History:  Procedure  Laterality Date   ABDOMINAL HYSTERECTOMY     Arthroscopic surgery left knee     BACK SURGERY     2005 ,  2017   BIOPSY  07/26/2019   Procedure: BIOPSY;  Surgeon: Gatha Mayer, MD;  Location: [facility]OS[facility];  Service: Endoscopy;;   CESAREAN SECTION     CHOLECYSTECTOMY     COLONOSCOPY  2003, 2011   3 mm adenoma 2011   COLONOSCOPY WITH PROPOFOL N/A 07/26/2019   Procedure: COLONOSCOPY WITH PROPOFOL;  Surgeon: Gatha Mayer, MD;  Location: [facility];  Service: Endoscopy;  Laterality: N/A;   ESOPHAGOGASTRODUODENOSCOPY  multiple since 2003   ESOPHAGOGASTRODUODENOSCOPY (EGD) WITH PROPOFOL N/A 07/26/2019   Procedure: ESOPHAGOGASTRODUODENOSCOPY (EGD) WITH PROPOFOL;  Surgeon: Gatha Mayer, MD;  Location: [facility];  Service: Endoscopy;  Laterality: N/A;   IR GENERIC HISTORICAL  12/19/2015   IR ANGIOGRAM SELECTIVE EACH ADDITIONAL VESSEL 12/19/2015 Greggory Keen, MD [facility]   IR GENERIC HISTORICAL  12/19/2015   IR ANGIOGRAM PULMONARY BILATERAL SELECTIVE 12/19/2015 Greggory Keen, MD [facility]   IR GENERIC HISTORICAL  12/19/2015   IR INFUSION THROMBOL ARTERIAL INITIAL (MS) 12/19/2015 Greggory Keen, MD [facility]   IR GENERIC HISTORICAL  12/19/2015   IR US GUIDE VASC ACCESS RIGHT 12/19/2015 Greggory Keen, MD [facility]   IR GENERIC HISTORICAL  12/19/2015   IR ANGIOGRAM SELECTIVE EACH ADDITIONAL VESSEL 12/19/2015 Greggory Keen, MD [facility]   IR  GENERIC HISTORICAL  12/20/2015   IR THROMB F/U EVAL ART/VEN FINAL DAY (MS) 12/20/2015 Corrie Mckusick, DO [facility]   IR GENERIC HISTORICAL  12/19/2015   IR INFUSION THROMBOL ARTERIAL INITIAL (MS) 12/19/2015 Greggory Keen, MD [facility]   IR RADIOLOGIST EVAL & MGMT  09/19/2020   PLANTAR FASCIA RELEASE     right   POLYPECTOMY  07/26/2019   Procedure: POLYPECTOMY;  Surgeon: Gatha Mayer, MD;  Location: [facility];  Service: Endoscopy;;   Right knee replacement     SPINAL FUSION  10/31/2015   revision at [place] Left 02/17/2020   Procedure: LEFT TOTAL HIP ARTHROPLASTY ANTERIOR APPROACH;  Surgeon: Mcarthur Rossetti, MD;  Location: [facility];  Service: Orthopedics;  Laterality: Left;   TOTAL KNEE ARTHROPLASTY Left 10/13/2017   Procedure: LEFT TOTAL KNEE ARTHROPLASTY;  Surgeon: Mcarthur Rossetti, MD;  Location: [place];  Service: Orthopedics;  Laterality: Left;   ULNAR TUNNEL RELEASE     right   VENOUS ABLATION     x 2    Allergies: Ciprofloxacin, Doxycycline, Metoprolol, Strawberry extract, and Tetracyclines & related  Medications: Prior to Admission medications   Medication Sig Start Date End Date Taking? Authorizing Provider  azithromycin (ZITHROMAX) 250 MG tablet 2 PO x 1 day, then 1 PO qd x 4 more days. 09/04/20   Hilts, Legrand Como, MD  diphenoxylate-atropine (LOMOTIL) 2.5-0.025 MG tablet Take 1 tablet by mouth 4 (four) times daily as needed for diarrhea or loose stools. 10/24/20   Hilts, Legrand Como, MD  ELIQUIS 5 MG TABS tablet TAKE 1 TABLET BY MOUTH TWICE A DAY Patient not taking: No sig reported 04/09/20   Collene Gobble, MD  ibuprofen (ADVIL) 200 MG tablet Take 200 mg by mouth every 4 (four) hours as needed for moderate pain or mild pain.    [provider]  lidocaine (LIDODERM) 5 % Place 1 patch onto the skin every 12 (twelve) hours. Remove & Discard patch within 12 hours or as directed by MD 08/17/20   Carrie Mew, MD  methocarbamol (ROBAXIN) 500 MG tablet Take 1 tablet (500 mg total) by mouth every 6 (six) hours as needed for muscle spasms. Patient not taking: No sig reported 02/21/20   Mcarthur Rossetti, MD  Multiple Vitamin (MULTIVITAMIN WITH MINERALS) TABS tablet Take 1 tablet by mouth daily.    [provider]  oxyCODONE (OXY IR/ROXICODONE) 5 MG immediate release tablet Take 1-2 tablets (5-10 mg total) by mouth every 4 (four) hours as needed for moderate pain (pain score 4-6). 04/05/20   Pete Pelt, PA-C   oxyCODONE-acetaminophen (PERCOCET/ROXICET) 5-325 MG tablet Take 1 tablet by mouth every 6 (six) hours as needed for severe pain. 08/20/20   Hilts, Legrand Como, MD  tizanidine (ZANAFLEX) 2 MG capsule Take 1 capsule (2 mg total) by mouth 3 (three) times daily. Patient not taking: No sig reported 05/03/20   Pete Pelt, PA-C     Family History  Problem Relation Age of Onset   Stroke Maternal Grandmother    Bladder Cancer Maternal Grandmother 58   Esophageal cancer Maternal Grandmother 49   Hyperthyroidism Mother        Radioactive Iodine Treatment   Hypothyroidism Mother    Cervical cancer Maternal Aunt 83   Ovarian cancer Daughter 89   Colon cancer Neg Hx    Pancreatic cancer Neg Hx    Rectal cancer Neg Hx  Stomach cancer Neg Hx    Diabetes Neg Hx     Social History   Socioeconomic History   Marital status: Married    Spouse name: Not on file   Number of children: 3   Years of education: Not on file   Highest education level: Not on file  Occupational History    Comment: Telephone sales  Tobacco Use   Smoking status: Never   Smokeless tobacco: Never  Vaping Use   Vaping Use: Never used  Substance and Sexual Activity   Alcohol use: No   Drug use: No   Sexual activity: Not Currently    Partners: Male    Birth control/protection: Surgical    Comment: Hysterectomy  Other Topics Concern   Not on file  Social History Narrative   Married.   3 children, 2 grandchildren.   Work's in a call center.   Enjoys reading, sewing, spending time with her grandchildren.   Social Determinants of Health   Financial Resource Strain: Not on file  Food Insecurity: Not on file  Transportation Needs: Not on file  Physical Activity: Not on file  Stress: Not on file  Social Connections: Not on file     Review of Systems: A 12 point ROS discussed and pertinent positives are indicated in the HPI above.  All other systems are negative.  Review of Systems  Constitutional:  Negative  for fatigue and fever.  Respiratory:  Negative for cough and shortness of breath.   Cardiovascular:  Negative for chest pain.  Gastrointestinal:  Negative for abdominal pain, diarrhea, nausea and vomiting.  Genitourinary:  Positive for pelvic pain. Negative for dysuria, frequency, urgency and vaginal discharge.  Musculoskeletal:  Negative for back pain.  Psychiatric/Behavioral:  Negative for behavioral problems and confusion.    Vital Signs: BP (!) 149/74   Pulse 78   Temp 97.9 F (36.6 C) (Oral)   Ht 5\' 2"  (1.575 m)   Wt 255 lb (115.7 kg)   SpO2 98%   BMI 46.64 kg/m   Physical Exam Vitals and nursing note reviewed.  Constitutional:      Appearance: Normal appearance.  HENT:     Mouth/Throat:     Mouth: Mucous membranes are moist.     Pharynx: Oropharynx is clear.  Cardiovascular:     Rate and Rhythm: Normal rate and regular rhythm.  Pulmonary:     Effort: Pulmonary effort is normal.     Breath sounds: Normal breath sounds.  Abdominal:     General: Abdomen is flat.     Palpations: Abdomen is soft.  Skin:    General: Skin is warm and dry.  Neurological:     General: No focal deficit present.     Mental Status: She is alert and oriented to person, place, and time. Mental status is at baseline.  Psychiatric:        Mood and Affect: Mood normal.        Behavior: Behavior normal.        Thought Content: Thought content normal.        Judgment: Judgment normal.     MD Evaluation Airway: WNL Heart: WNL Abdomen: WNL Chest/ Lungs: WNL ASA  Classification: 3 Mallampati/Airway Score: Two   Imaging: No results found.  Labs:  CBC: Recent Labs    02/06/20 1430 02/18/20 0314 08/17/20 1434  WBC 6.3 12.2* 5.4  HGB 14.0 11.5* 12.5  HCT 42.8 35.2* 37.9  PLT 233 218 219    COAGS: No  results for input(s): INR, APTT in the last 8760 hours.  BMP: Recent Labs    02/06/20 1430 02/18/20 0314 08/17/20 1434  NA 142 138 138  K 4.1 4.3 4.0  CL 107 102 108  CO2  26 25 21*  GLUCOSE 106* 139* 121*  BUN 16 15 14   CALCIUM 9.4 8.7* 9.2  CREATININE 0.92 0.86 0.77  GFRNONAA >60 >60 >60    LIVER FUNCTION TESTS: No results for input(s): BILITOT, AST, ALT, ALKPHOS, PROT, ALBUMIN in the last 8760 hours.  TUMOR MARKERS: No results for input(s): AFPTM, CEA, CA199, CHROMGRNA in the last 8760 hours.  Assessment and Plan: Patient with past medical history of right ovarian cyst, multiple abdominal and pelvic surgeries presents with complaint of recurrent of cyst with symptoms of pelvic pain and pressure.  IR consulted for aspiration. Case reviewed by Dr. Dwaine Gale who approves patient for procedure and has been seen in consultation.  Patient presents today in their usual state of health.  She has been NPO and is not currently on blood thinners.   Risks and benefits of aspiration was discussed with the patient and/or patient's family including, but not limited to bleeding, infection, damage to adjacent structures.  All of the questions were answered and there is agreement to proceed.  Consent signed and in chart.  Thank you for this interesting consult.  I greatly enjoyed meeting YASENIA REEDY and look forward to participating in their care.  A copy of this report was sent to the requesting provider on this date.  Electronically Signed: Docia Barrier, PA 10/24/2020, 11:16 AM   I spent a total of  30 Minutes   in face to face in clinical consultation, greater than 50% of which was counseling/coordinating care for right ovarian cyst.

## 2020-10-27 NOTE — Telephone Encounter (Signed)
Noted in [place] follow up was on 10/10/20 with Dr Kennon Rounds.

## 2020-10-29 ENCOUNTER — Ambulatory Visit: Payer: Self-pay | Admitting: *Deleted

## 2020-10-29 ENCOUNTER — Other Ambulatory Visit: Payer: Self-pay

## 2020-10-29 VITALS — BP 141/91 | Ht 61.0 in | Wt 258.0 lb

## 2020-10-29 DIAGNOSIS — E119 Type 2 diabetes mellitus without complications: Secondary | ICD-10-CM

## 2020-10-29 DIAGNOSIS — Z0000 Encounter for general adult medical examination without abnormal findings: Secondary | ICD-10-CM

## 2020-10-29 NOTE — Progress Notes (Signed)
[employer] insurance premium discount evaluation: Labs Drawn. Replacements ROI form signed. Tobacco Free Attestation form signed.  Forms placed in paper chart.  

## 2020-10-30 ENCOUNTER — Encounter: Payer: Self-pay | Admitting: Registered Nurse

## 2020-10-30 LAB — CMP12+LP+TP+TSH+6AC+CBC/D/PLT
ALT: 10 IU/L (ref 0–32)
AST: 11 IU/L (ref 0–40)
Albumin/Globulin Ratio: 1.4 (ref 1.2–2.2)
Albumin: 3.8 g/dL (ref 3.8–4.8)
Alkaline Phosphatase: 85 IU/L (ref 44–121)
BUN/Creatinine Ratio: 20 (ref 12–28)
BUN: 17 mg/dL (ref 8–27)
Basophils Absolute: 0.1 10*3/uL (ref 0.0–0.2)
Basos: 1 %
Bilirubin Total: 0.8 mg/dL (ref 0.0–1.2)
Calcium: 9.1 mg/dL (ref 8.7–10.3)
Chloride: 102 mmol/L (ref 96–106)
Chol/HDL Ratio: 3.9 ratio (ref 0.0–4.4)
Cholesterol, Total: 173 mg/dL (ref 100–199)
Creatinine, Ser: 0.86 mg/dL (ref 0.57–1.00)
EOS (ABSOLUTE): 0.2 10*3/uL (ref 0.0–0.4)
Eos: 4 %
Estimated CHD Risk: 0.8 times avg. (ref 0.0–1.0)
Free Thyroxine Index: 2 (ref 1.2–4.9)
GGT: 55 IU/L (ref 0–60)
Globulin, Total: 2.8 g/dL (ref 1.5–4.5)
Glucose: 126 mg/dL — ABNORMAL HIGH (ref 65–99)
HDL: 44 mg/dL (ref >39)
Hematocrit: 40.8 % (ref 34.0–46.6)
Hemoglobin: 13.2 g/dL (ref 11.1–15.9)
Immature Grans (Abs): 0 10*3/uL (ref 0.0–0.1)
Immature Granulocytes: 0 %
Iron: 64 ug/dL (ref 27–139)
LDH: 193 IU/L (ref 119–226)
LDL Chol Calc (NIH): 104 mg/dL — ABNORMAL HIGH (ref 0–99)
Lymphocytes Absolute: 1.5 10*3/uL (ref 0.7–3.1)
Lymphs: 28 %
MCH: 26 pg — ABNORMAL LOW (ref 26.6–33.0)
MCHC: 32.4 g/dL (ref 31.5–35.7)
MCV: 81 fL (ref 79–97)
Monocytes Absolute: 0.3 10*3/uL (ref 0.1–0.9)
Monocytes: 6 %
Neutrophils Absolute: 3.3 10*3/uL (ref 1.4–7.0)
Neutrophils: 61 %
Phosphorus: 3.5 mg/dL (ref 3.0–4.3)
Platelets: 239 10*3/uL (ref 150–450)
Potassium: 4.4 mmol/L (ref 3.5–5.2)
RBC: 5.07 x10E6/uL (ref 3.77–5.28)
RDW: 13.9 % (ref 11.7–15.4)
Sodium: 145 mmol/L — ABNORMAL HIGH (ref 134–144)
T3 Uptake Ratio: 21 % — ABNORMAL LOW (ref 24–39)
T4, Total: 9.3 ug/dL (ref 4.5–12.0)
TSH: 3.54 u[IU]/mL (ref 0.450–4.500)
Total Protein: 6.6 g/dL (ref 6.0–8.5)
Triglycerides: 138 mg/dL (ref 0–149)
Uric Acid: 4.7 mg/dL (ref 3.0–7.2)
VLDL Cholesterol Cal: 25 mg/dL (ref 5–40)
WBC: 5.5 10*3/uL (ref 3.4–10.8)
eGFR: 73 mL/min/{1.73_m2} (ref >59)

## 2020-10-30 LAB — HGB A1C W/O EAG: Hgb A1c MFr Bld: 6.7 % — ABNORMAL HIGH (ref 4.8–5.6)

## 2020-10-30 NOTE — Addendum Note (Signed)
Addended by: Gerarda Fraction A on: 10/30/2020 01:58 PM   Modules accepted: Orders

## 2020-11-13 ENCOUNTER — Encounter: Payer: Self-pay | Admitting: Sports Medicine

## 2020-11-13 ENCOUNTER — Ambulatory Visit (INDEPENDENT_AMBULATORY_CARE_PROVIDER_SITE_OTHER): Payer: No Typology Code available for payment source | Admitting: Sports Medicine

## 2020-11-13 ENCOUNTER — Other Ambulatory Visit: Payer: Self-pay

## 2020-11-13 VITALS — BP 127/82 | HR 75 | Ht 61.0 in | Wt 260.0 lb

## 2020-11-13 DIAGNOSIS — E119 Type 2 diabetes mellitus without complications: Secondary | ICD-10-CM

## 2020-11-13 DIAGNOSIS — E559 Vitamin D deficiency, unspecified: Secondary | ICD-10-CM | POA: Diagnosis not present

## 2020-11-13 DIAGNOSIS — Z0000 Encounter for general adult medical examination without abnormal findings: Secondary | ICD-10-CM | POA: Insufficient documentation

## 2020-11-13 DIAGNOSIS — Z86711 Personal history of pulmonary embolism: Secondary | ICD-10-CM | POA: Diagnosis not present

## 2020-11-13 DIAGNOSIS — E538 Deficiency of other specified B group vitamins: Secondary | ICD-10-CM | POA: Diagnosis not present

## 2020-11-13 DIAGNOSIS — N62 Hypertrophy of breast: Secondary | ICD-10-CM

## 2020-11-13 DIAGNOSIS — I480 Paroxysmal atrial fibrillation: Secondary | ICD-10-CM

## 2020-11-13 DIAGNOSIS — M793 Panniculitis, unspecified: Secondary | ICD-10-CM | POA: Insufficient documentation

## 2020-11-13 MED ORDER — PREVNAR 20 0.5 ML IM SUSY: 0.5000 mL | PREFILLED_SYRINGE | Freq: Once | INTRAMUSCULAR | 0 refills | Status: AC

## 2020-11-13 MED ORDER — SHINGRIX 50 MCG/0.5ML IM SUSR: 0.5000 mL | Freq: Once | INTRAMUSCULAR | 0 refills | Status: AC

## 2020-11-13 MED ORDER — TETANUS-DIPHTH-ACELL PERTUSSIS 5-2.5-18.5 LF-MCG/0.5 IM SUSY: 0.5000 mL | PREFILLED_SYRINGE | Freq: Once | INTRAMUSCULAR | 0 refills | Status: AC

## 2020-11-13 NOTE — Assessment & Plan Note (Signed)
Due for some preventative measures including Tdap, Shingrix, Prevnar 20, DEXA scan, mammogram.

## 2020-11-13 NOTE — Assessment & Plan Note (Signed)
Chronic discomfort neck, grooves in the shoulders from bra straps, intertrigo under the breasts, very interested in breast reduction, I think this is reasonable, plastic surgery referral.

## 2020-11-13 NOTE — Assessment & Plan Note (Signed)
Multiple painful nodules across the mid abdomen in the subcutaneous tissues. I would like dermatology to weigh in.

## 2020-11-13 NOTE — Assessment & Plan Note (Addendum)
Diet-controlled diabetes. Eye exam coming up, foot exam performed today. She does endorse episodes of weakness and feeling drained particularly when standing but does occur randomly through the day. She will check her vital signs as well as her blood sugar immediately if these episodes recur. It sounds like she did have a Holter monitor that was normal. She also had an echocardiogram in April 2022, the only echo I can see is from 2018 and was for the most part normal.

## 2020-11-13 NOTE — Addendum Note (Signed)
Addended by: Silverio Decamp on: 11/13/2020 03:54 PM   Modules accepted: Orders

## 2020-11-13 NOTE — Progress Notes (Addendum)
    Procedures performed today:    None.  Independent interpretation of notes and tests performed by another provider:   None.  Brief History, Exam, Impression, and Recommendations:    Annual physical exam Due for some preventative measures including Tdap, Shingrix, Prevnar 20, DEXA scan, mammogram.  History of pulmonary embolism Jeweliana also has a history of pulmonary embolism with cor pulmonale, she has been seeing her cardiologist, it sounds like she had 2 episodes of DVT with one PE. Due to the 2 episodes I do think she needs to be on Eliquis lifelong, she would like to think about it.  Paroxysmal atrial fibrillation (HCC) Continue follow-up with cardiology, really needs to be on Eliquis with her A. fib and historical two DVTs with PE.  Type 2 diabetes mellitus without complication, without long-term current use of insulin (HCC) Diet-controlled diabetes. Eye exam coming up, foot exam performed today. She does endorse episodes of weakness and feeling drained particularly when standing but does occur randomly through the day. She will check her vital signs as well as her blood sugar immediately if these episodes recur. It sounds like she did have a Holter monitor that was normal. She also had an echocardiogram in April 2022, the only echo I can see is from 2018 and was for the most part normal.  Panniculitis Multiple painful nodules across the mid abdomen in the subcutaneous tissues. I would like dermatology to weigh in.  Macromastia Chronic discomfort neck, grooves in the shoulders from bra straps, intertrigo under the breasts, very interested in breast reduction, I think this is reasonable, plastic surgery referral.  B12 deficiency We will start with a trial of oral B12 for a month, if this does not sufficiently raise her B12 she likely has some degree of malabsorption and would need injectable.  Vitamin D deficiency Adding 50,000 units weekly  x8.    ___________________________________________ Gwen Her. Dianah Field, M.D., ABFM., CAQSM. Primary Care and [place] Instructor of [place] of [facility]

## 2020-11-13 NOTE — Assessment & Plan Note (Signed)
Continue follow-up with cardiology, really needs to be on Eliquis with her A. fib and historical two DVTs with PE.

## 2020-11-13 NOTE — Assessment & Plan Note (Signed)
[name] also has a history of pulmonary embolism with cor pulmonale, she has been seeing her cardiologist, it sounds like she had 2 episodes of DVT with one PE. Due to the 2 episodes I do think she needs to be on Eliquis lifelong, she would like to think about it.

## 2020-11-15 ENCOUNTER — Other Ambulatory Visit: Payer: Self-pay | Admitting: Sports Medicine

## 2020-11-15 DIAGNOSIS — Z0000 Encounter for general adult medical examination without abnormal findings: Secondary | ICD-10-CM

## 2020-11-15 DIAGNOSIS — Z1231 Encounter for screening mammogram for malignant neoplasm of breast: Secondary | ICD-10-CM

## 2020-11-17 ENCOUNTER — Ambulatory Visit
Admission: RE | Admit: 2020-11-17 | Discharge: 2020-11-17 | Disposition: A | Discharge status: Home / Self Care | Payer: No Typology Code available for payment source | Source: Ambulatory Visit | Attending: Sports Medicine | Admitting: Sports Medicine

## 2020-11-17 DIAGNOSIS — Z1231 Encounter for screening mammogram for malignant neoplasm of breast: Secondary | ICD-10-CM

## 2020-11-20 ENCOUNTER — Other Ambulatory Visit: Payer: Self-pay

## 2020-11-20 ENCOUNTER — Ambulatory Visit
Admission: RE | Admit: 2020-11-20 | Discharge: 2020-11-20 | Disposition: A | Discharge status: Home / Self Care | Payer: No Typology Code available for payment source | Source: Ambulatory Visit | Attending: Sports Medicine | Admitting: Sports Medicine

## 2020-11-20 DIAGNOSIS — Z0000 Encounter for general adult medical examination without abnormal findings: Secondary | ICD-10-CM

## 2020-11-21 ENCOUNTER — Other Ambulatory Visit: Payer: Self-pay | Admitting: Sports Medicine

## 2020-11-21 DIAGNOSIS — R928 Other abnormal and inconclusive findings on diagnostic imaging of breast: Secondary | ICD-10-CM

## 2020-11-21 LAB — LIPID PANEL
Cholesterol: 183 mg/dL (ref <200)
HDL: 42 mg/dL — ABNORMAL LOW (ref >=50)
LDL Cholesterol (Calc): 115 mg/dL (calc) — ABNORMAL HIGH
Non-HDL Cholesterol (Calc): 141 mg/dL (calc) — ABNORMAL HIGH (ref <130)
Total CHOL/HDL Ratio: 4.4 (calc) (ref <5.0)
Triglycerides: 144 mg/dL (ref <150)

## 2020-11-21 LAB — VITAMIN D 25 HYDROXY (VIT D DEFICIENCY, FRACTURES): Vit D, 25-Hydroxy: 23 ng/mL — ABNORMAL LOW (ref 30–100)

## 2020-11-21 LAB — TSH: TSH: 3.68 mIU/L (ref 0.40–4.50)

## 2020-11-21 LAB — VITAMIN B12: Vitamin B-12: 272 pg/mL (ref 200–1100)

## 2020-11-21 MED ORDER — VITAMIN D (ERGOCALCIFEROL) 1.25 MG (50000 UNIT) PO CAPS: 50000.0000 [IU] | ORAL_CAPSULE | ORAL | 0 refills | Status: DC

## 2020-11-21 MED ORDER — VITAMIN B-12 1000 MCG PO TABS: 1000.0000 ug | ORAL_TABLET | Freq: Every day | ORAL | 11 refills | Status: DC

## 2020-11-21 NOTE — Assessment & Plan Note (Signed)
We will start with a trial of oral B12 for a month, if this does not sufficiently raise her B12 she likely has some degree of malabsorption and would need injectable.

## 2020-11-21 NOTE — Addendum Note (Signed)
Addended by: Silverio Decamp on: 11/21/2020 08:39 AM   Modules accepted: Orders

## 2020-11-21 NOTE — Assessment & Plan Note (Signed)
Adding 50,000 units weekly x8.

## 2020-11-22 LAB — MICROALBUMIN / CREATININE URINE RATIO
Creatinine, Urine: 187 mg/dL (ref 20–275)
Microalb Creat Ratio: 3 mcg/mg creat (ref <30)
Microalb, Ur: 0.5 mg/dL

## 2020-11-26 ENCOUNTER — Other Ambulatory Visit: Payer: Self-pay | Admitting: Family Medicine

## 2020-12-13 ENCOUNTER — Ambulatory Visit
Admission: RE | Admit: 2020-12-13 | Discharge: 2020-12-13 | Disposition: A | Discharge status: Home / Self Care | Payer: No Typology Code available for payment source | Source: Ambulatory Visit | Attending: Sports Medicine | Admitting: Sports Medicine

## 2020-12-13 ENCOUNTER — Other Ambulatory Visit: Payer: Self-pay

## 2020-12-13 DIAGNOSIS — R928 Other abnormal and inconclusive findings on diagnostic imaging of breast: Secondary | ICD-10-CM

## 2020-12-31 ENCOUNTER — Ambulatory Visit (INDEPENDENT_AMBULATORY_CARE_PROVIDER_SITE_OTHER): Payer: No Typology Code available for payment source | Admitting: Orthopaedic Surgery

## 2020-12-31 ENCOUNTER — Other Ambulatory Visit: Payer: Self-pay

## 2020-12-31 ENCOUNTER — Ambulatory Visit (INDEPENDENT_AMBULATORY_CARE_PROVIDER_SITE_OTHER): Payer: No Typology Code available for payment source

## 2020-12-31 ENCOUNTER — Encounter: Payer: Self-pay | Admitting: Orthopaedic Surgery

## 2020-12-31 DIAGNOSIS — Z96642 Presence of left artificial hip joint: Secondary | ICD-10-CM | POA: Diagnosis not present

## 2020-12-31 NOTE — Progress Notes (Signed)
The patient is now 11 months status post a left total hip arthroplasty.  We have replaced her left knee as well.  She says she is doing well.  She does ambit with a cane but feels like she is making good progress.  She is someone who is moderately obese.  She is 70 years old and does now have a history of a recent diagnosis of osteopenia.  She is being treated with vitamin D.  Her right hip moves smoothly.  Her left operative hip moves smoothly as well with no complicating features.  A low AP pelvis shows a well-seated hip replacement on the left side.  The distal aspect of the stem has not seen at the components around the hip joint itself look good.  At this point follow-up can be as needed since she is doing so well.  All questions and concerns were answered and addressed.  I have advocated weight loss and exercising.

## 2021-01-04 ENCOUNTER — Telehealth (INDEPENDENT_AMBULATORY_CARE_PROVIDER_SITE_OTHER): Payer: No Typology Code available for payment source | Admitting: Osteopathic Medicine

## 2021-01-04 ENCOUNTER — Telehealth: Payer: No Typology Code available for payment source | Admitting: Family Medicine

## 2021-01-04 DIAGNOSIS — Z0289 Encounter for other administrative examinations: Secondary | ICD-10-CM

## 2021-01-04 NOTE — Progress Notes (Signed)
Attempted to contact patient at 1010 am. No answer, went straight to vm. Left a detailed vm msg. Direct call back info provided.

## 2021-01-04 NOTE — Progress Notes (Signed)
See assistant's note. Never able to reach patient for pre-visit intake. From our end never saw that patient was signed into MyChart. Pt called back states she will reschedule.

## 2021-01-14 ENCOUNTER — Ambulatory Visit (INDEPENDENT_AMBULATORY_CARE_PROVIDER_SITE_OTHER): Payer: No Typology Code available for payment source | Admitting: Sports Medicine

## 2021-01-14 ENCOUNTER — Encounter: Payer: Self-pay | Admitting: Sports Medicine

## 2021-01-14 DIAGNOSIS — E538 Deficiency of other specified B group vitamins: Secondary | ICD-10-CM

## 2021-01-14 DIAGNOSIS — Z86711 Personal history of pulmonary embolism: Secondary | ICD-10-CM

## 2021-01-14 DIAGNOSIS — J329 Chronic sinusitis, unspecified: Secondary | ICD-10-CM

## 2021-01-14 DIAGNOSIS — Z0000 Encounter for general adult medical examination without abnormal findings: Secondary | ICD-10-CM

## 2021-01-14 MED ORDER — IPRATROPIUM BROMIDE 0.06 % NA SOLN: 2.0000 | Freq: Four times a day (QID) | NASAL | 11 refills | Status: AC

## 2021-01-14 MED ORDER — AZITHROMYCIN 250 MG PO TABS: ORAL_TABLET | ORAL | 0 refills | Status: DC

## 2021-01-14 MED ORDER — APIXABAN 5 MG PO TABS: 5.0000 mg | ORAL_TABLET | Freq: Two times a day (BID) | ORAL | 11 refills | Status: DC

## 2021-01-14 NOTE — Assessment & Plan Note (Signed)
Sounds like he has had some symptoms of sinusitis, adding nasal Atrovent and azithromycin, return as needed.

## 2021-01-14 NOTE — Assessment & Plan Note (Signed)
Has now had a 96-month oral trial of B12, rechecking B12 levels. If they are still low we will proceed with injections, if they have come up, her symptoms have not improved with regards to fatigue so we would just stop.

## 2021-01-14 NOTE — Progress Notes (Addendum)
    Procedures performed today:    None.  Independent interpretation of notes and tests performed by another provider:   None.  Brief History, Exam, Impression, and Recommendations:    B12 deficiency Has now had a 51-month oral trial of B12, rechecking B12 levels. If they are still low we will proceed with injections, if they have come up, her symptoms have not improved with regards to fatigue so we would just stop.  Annual physical exam Understands that she is due for Tdap, Shingrix, Prevnar 20, flu shots. She is can hold off as she is got a lot in her life right now, she can just circle back with me for prescriptions for these vaccines when she is ready.  History of pulmonary embolism History of 2 DVTs 1 pulmonary embolism, she did have cor pulmonale, she has not seen her cardiologist in a while, does have A. fib with a CHADS2-VASC score of 3, restarting Eliquis.  Sinusitis Sounds like he has had some symptoms of sinusitis, adding nasal Atrovent and azithromycin, return as needed.    ___________________________________________ Gwen Her. Dianah Field, M.D., ABFM., CAQSM. Primary Care and [place] Instructor of [place] of [facility]

## 2021-01-14 NOTE — Assessment & Plan Note (Signed)
Understands that she is due for Tdap, Shingrix, Prevnar 20, flu shots. She is can hold off as she is got a lot in her life right now, she can just circle back with me for prescriptions for these vaccines when she is ready.

## 2021-01-14 NOTE — Assessment & Plan Note (Signed)
History of 2 DVTs 1 pulmonary embolism, she did have cor pulmonale, she has not seen her cardiologist in a while, does have A. fib with a CHADS2-VASC score of 3, restarting Eliquis.

## 2021-01-14 NOTE — Addendum Note (Signed)
Addended by: Silverio Decamp on: 01/14/2021 11:07 AM   Modules accepted: Orders

## 2021-01-16 LAB — VITAMIN B12: Vitamin B-12: 475 pg/mL (ref 200–1100)

## 2021-01-16 LAB — METHYLMALONIC ACID, SERUM: Methylmalonic Acid, Quant: 118 nmol/L (ref 87–318)

## 2021-01-23 ENCOUNTER — Other Ambulatory Visit: Payer: Self-pay | Admitting: Sports Medicine

## 2021-01-23 NOTE — Telephone Encounter (Signed)
Last OV: 01/14/21 Last fill: 11/26/20 #30

## 2021-01-24 ENCOUNTER — Encounter: Payer: Self-pay | Admitting: Plastic Surgery

## 2021-01-24 ENCOUNTER — Ambulatory Visit (INDEPENDENT_AMBULATORY_CARE_PROVIDER_SITE_OTHER): Payer: No Typology Code available for payment source | Admitting: Plastic Surgery

## 2021-01-24 ENCOUNTER — Other Ambulatory Visit: Payer: Self-pay

## 2021-01-24 VITALS — BP 142/81 | HR 74 | Ht 62.0 in | Wt 260.8 lb

## 2021-01-24 DIAGNOSIS — M545 Low back pain, unspecified: Secondary | ICD-10-CM | POA: Diagnosis not present

## 2021-01-24 DIAGNOSIS — M4004 Postural kyphosis, thoracic region: Secondary | ICD-10-CM

## 2021-01-24 DIAGNOSIS — N62 Hypertrophy of breast: Secondary | ICD-10-CM

## 2021-01-24 DIAGNOSIS — M546 Pain in thoracic spine: Secondary | ICD-10-CM | POA: Diagnosis not present

## 2021-01-24 NOTE — Progress Notes (Signed)
Referring Provider Silverio Decamp, [place] [address],  [place] [address]   CC:  Chief Complaint  Patient presents with   Advice Only      [name] is an 70 y.o. female.  HPI: Patient presents to discuss breast reduction.  She has had years of back pain, neck pain and shoulder grooving related to her large breast.  She is tried over-the-counter medications, warm packs, cold packs and supportive bras with little relief.  She also gets rashes beneath her breast that been refractory to over-the-counter treatments.  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  This has not been biopsied.  They recommended repeat imaging be performed several months down the line.  The patient is particularly concerned about this.  She has not met with the breast surgeon.  Also of note she does take Eliquis which she is on for atrial fibrillation.  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  It sounds like she was off the Eliquis for a period of time after that operation and then developed a pulmonary embolus which she has recovered from.  Allergies  Allergen Reactions   Ciprofloxacin Anaphylaxis   Doxycycline Anaphylaxis   Metoprolol Anaphylaxis   Strawberry Extract Anaphylaxis   Tetracyclines & Related Anaphylaxis    Outpatient Encounter Medications as of 01/24/2021  Medication Sig   apixaban (ELIQUIS) 5 MG TABS tablet Take 1 tablet (5 mg total) by mouth 2 (two) times daily.   aspirin EC 81 MG tablet Take 81 mg by mouth daily. Swallow whole.   diphenoxylate-atropine (LOMOTIL) 2.5-0.025 MG tablet TAKE 1 TABLET BY MOUTH 4 TIMES DAILY AS NEEDED FOR DIARRHEA OR LOOSE STOOLS.   ipratropium (ATROVENT) 0.06 % nasal spray Place 2 sprays into both nostrils 4 (four) times daily.   Multiple Vitamin (MULTIVITAMIN WITH MINERALS) TABS tablet Take 1 tablet by mouth daily.   vitamin B-12  (CYANOCOBALAMIN) 1000 MCG tablet Take 1 tablet (1,000 mcg total) by mouth daily.   Vitamin D, Ergocalciferol, (DRISDOL) 1.25 MG (50000 UNIT) CAPS capsule Take 1 capsule (50,000 Units total) by mouth every 7 (seven) days. Take for 8 total doses(weeks)   azithromycin (ZITHROMAX Z-PAK) 250 MG tablet Take 2 tablets (500 mg) on  Day 1,  followed by 1 tablet (250 mg) once daily on Days 2 through 5.   No facility-administered encounter medications on file as of 01/24/2021.     Past Medical History:  Diagnosis Date   Abnormal thyroid function test    Allergy    Anemia    she attributes previously to fibroids, she declines   Barrett's esophagus    Blood transfusion without reported diagnosis    DDD (degenerative disc disease), cervical    DJD (degenerative joint disease)    Dysrhythmia    GERD (gastroesophageal reflux disease)    Heart murmur    History of bronchitis    History of hiatal hernia    Hx of adenomatous polyp of colon 03/01/2002   Myocardial infarction (HCC)    OA (osteoarthritis)    Obese    Paroxysmal atrial fibrillation (HCC)    PE (pulmonary embolism) 12/21/2015   Plantar fasciitis    Pneumonia    PONV (postoperative nausea and vomiting)    pt. reports that she woke up during surgery   Pre-diabetes    Pulmonary embolism [facility])     Past Surgical  History:  Procedure Laterality Date   ABDOMINAL HYSTERECTOMY     Arthroscopic surgery left knee     BACK SURGERY     2005 ,  2017   BIOPSY  07/26/2019   Procedure: BIOPSY;  Surgeon: Gatha Mayer, MD;  [facility];  Service: Endoscopy;;   CESAREAN SECTION     CHOLECYSTECTOMY     COLONOSCOPY  2003, 2011   3 mm adenoma 2011   COLONOSCOPY WITH PROPOFOL N/A 07/26/2019   Procedure: COLONOSCOPY WITH PROPOFOL;  Surgeon: Gatha Mayer, MD;  [facility];  Service: Endoscopy;  Laterality: N/A;   ESOPHAGOGASTRODUODENOSCOPY  multiple since 2003   ESOPHAGOGASTRODUODENOSCOPY (EGD) WITH PROPOFOL N/A 07/26/2019    Procedure: ESOPHAGOGASTRODUODENOSCOPY (EGD) WITH PROPOFOL;  Surgeon: Gatha Mayer, MD;  [facility];  Service: Endoscopy;  Laterality: N/A;   IR GENERIC HISTORICAL  12/19/2015   IR ANGIOGRAM SELECTIVE EACH ADDITIONAL VESSEL 12/19/2015 Greggory Keen, MD [facility]   IR GENERIC HISTORICAL  12/19/2015   IR ANGIOGRAM PULMONARY BILATERAL SELECTIVE 12/19/2015 Greggory Keen, MD [facility]   IR GENERIC HISTORICAL  12/19/2015   IR INFUSION THROMBOL ARTERIAL INITIAL (MS) 12/19/2015 Greggory Keen, MD [facility]   IR GENERIC HISTORICAL  12/19/2015   IR US GUIDE VASC ACCESS RIGHT 12/19/2015 Greggory Keen, MD [facility]   IR GENERIC HISTORICAL  12/19/2015   IR [facility] SELECTIVE EACH ADDITIONAL VESSEL 12/19/2015 Greggory Keen, MD [facility]   IR GENERIC HISTORICAL  12/20/2015   IR THROMB F/U EVAL ART/VEN FINAL DAY (MS) 12/20/2015 Corrie Mckusick, DO [facility]   IR GENERIC HISTORICAL  12/19/2015   IR INFUSION THROMBOL ARTERIAL INITIAL (MS) 12/19/2015 Greggory Keen, MD [facility]   IR RADIOLOGIST EVAL & MGMT  09/19/2020   PLANTAR FASCIA RELEASE     right   POLYPECTOMY  07/26/2019   Procedure: POLYPECTOMY;  Surgeon: Gatha Mayer, MD;  [facility];  Service: Endoscopy;;   Right knee replacement     SPINAL FUSION  10/31/2015   revision at [facility] Left 02/17/2020   Procedure: LEFT TOTAL HIP ARTHROPLASTY ANTERIOR APPROACH;  Surgeon: Mcarthur Rossetti, MD;  [facility];  Service: Orthopedics;  Laterality: Left;   TOTAL KNEE ARTHROPLASTY Left 10/13/2017   Procedure: LEFT TOTAL KNEE ARTHROPLASTY;  Surgeon: Mcarthur Rossetti, MD;  Location: [facility];  Service: Orthopedics;  Laterality: Left;   ULNAR TUNNEL RELEASE     right   VENOUS ABLATION     x 2    Family History  Problem Relation Age of Onset   Stroke Maternal Grandmother    Bladder Cancer Maternal Grandmother 30   Esophageal cancer Maternal  Grandmother 41   Hyperthyroidism Mother        Radioactive Iodine Treatment   Hypothyroidism Mother    Cervical cancer Maternal Aunt 91   Ovarian cancer Daughter 52   Colon cancer Neg Hx    Pancreatic cancer Neg Hx    Rectal cancer Neg Hx    Stomach cancer Neg Hx    Diabetes Neg Hx     Social History   Social History Narrative   Married.   3 children, 2 grandchildren.   Work's in a call center.   Enjoys reading, sewing, spending time with her grandchildren.     Review of Systems General: Denies fevers, chills, weight loss CV: Denies chest pain, shortness of breath, palpitations  Physical Exam Vitals  with BMI 01/24/2021 01/14/2021 01/14/2021  Height '5\' 2"'  - '5\' 1"'   Weight 260 lbs 13 oz - 260 lbs  BMI 37.49 - 66.46  Systolic 605 637 294  Diastolic 81 88 262  Pulse 74 87 105    General:  No acute distress,  Alert and oriented, Non-Toxic, Normal speech and affect Breast: She has grade 3 ptosis.  Sternal notch to nipple is greater than 40 cm bilaterally.  Nipple to fold is 23 cm bilaterally.  No obvious scars.  She does have what feels like a mass in the upper portion of her right breast.  Assessment/Plan I long discussion with the patient about breast reduction.  We discussed the details of the operation and the scars.  We discussed the risks include bleeding, infection, damage to surrounding structures need for additional procedures.  I did explain to her that she has a constellation of risk factors that affect her candidacy for this procedure.  Her BMI is around 48.  She also is on blood thinners with a history of large pulmonary embolus.  I explained to her in my opinion her risk factors were significant and did not recommend breast reduction for her.  I did explain that she could have the mass evaluated and removal of the mass would be a smaller procedure and might put her mind at ease regarding the pathology.  Is also possible that this could just be biopsied.  She is very  familiar with Dr. Jean Rosenthal who performed her orthopedic operations and would like to see Dr. Nedra Hai regarding her breast mass to get opinion on that.  We will facilitate that for her.  I did explain if she was able to decrease her BMI that it would improve her candidacy and we could reevaluate whether or not a breast reduction would be indicated at that time.  She was disappointed but ultimately understood.  All her questions were answered.  Cindra Presume 01/24/2021, 4:45 PM

## 2021-02-24 ENCOUNTER — Other Ambulatory Visit: Payer: Self-pay | Admitting: Sports Medicine

## 2021-02-24 DIAGNOSIS — E559 Vitamin D deficiency, unspecified: Secondary | ICD-10-CM

## 2021-02-26 ENCOUNTER — Other Ambulatory Visit: Payer: Self-pay

## 2021-02-26 ENCOUNTER — Ambulatory Visit: Payer: Self-pay | Admitting: *Deleted

## 2021-02-26 DIAGNOSIS — E119 Type 2 diabetes mellitus without complications: Secondary | ICD-10-CM

## 2021-02-26 NOTE — Progress Notes (Signed)
Due A1c per NP Tina's orders. Pt will view results in MyChart.  Pt is retiring on Friday 11/11 after 25 years at [employer]. 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 Congratulated and wished her well.

## 2021-02-27 ENCOUNTER — Other Ambulatory Visit: Payer: Self-pay | Admitting: Registered Nurse

## 2021-02-27 LAB — HEMOGLOBIN A1C
Est. average glucose Bld gHb Est-mCnc: 148 mg/dL
Hgb A1c MFr Bld: 6.8 % — ABNORMAL HIGH (ref 4.8–5.6)

## 2021-04-25 ENCOUNTER — Encounter: Payer: Self-pay | Admitting: Orthopedic Surgery

## 2021-04-25 ENCOUNTER — Other Ambulatory Visit: Payer: Self-pay

## 2021-04-25 ENCOUNTER — Ambulatory Visit: Payer: Medicare Other | Admitting: Orthopedic Surgery

## 2021-04-25 ENCOUNTER — Ambulatory Visit (INDEPENDENT_AMBULATORY_CARE_PROVIDER_SITE_OTHER): Payer: Medicare Other

## 2021-04-25 VITALS — BP 138/91 | HR 96 | Ht 61.0 in | Wt 262.2 lb

## 2021-04-25 DIAGNOSIS — M79644 Pain in right finger(s): Secondary | ICD-10-CM

## 2021-04-25 DIAGNOSIS — M1811 Unilateral primary osteoarthritis of first carpometacarpal joint, right hand: Secondary | ICD-10-CM

## 2021-04-25 DIAGNOSIS — G8929 Other chronic pain: Secondary | ICD-10-CM | POA: Diagnosis not present

## 2021-04-25 NOTE — Progress Notes (Signed)
Office Visit Note   Patient: [name]           Date of Birth: Nov 16, 1950           MRN: [id number] Visit Date: 04/25/2021              Requested by: Monica Becton, MD [address] [place],  [place] [address] PCP: Monica Becton, MD   Assessment & Plan: Visit Diagnoses:  1. Chronic pain of right thumb     Plan: We discussed the diagnosis, prognosis, non-operative and operative treatment options for thumb CMC and MP arthritis.  She has tried topical and oral NSAIDs, CSI, bracing, and therapy.  We discussed CMC arthroplasty with MP arthrodesis.  We discussed the risks, benefits, and alternatives to the procedure.  She wants to go home and think about it.  I can see her back as needed.   Follow-Up Instructions: No follow-ups on file.   Orders:  Orders Placed This Encounter  Procedures   XR Finger Thumb Right   No orders of the defined types were placed in this encounter.     Procedures: No procedures performed   Clinical Data: No additional findings.   Subjective: Chief Complaint  Patient presents with   Right Hand - New Patient (Initial Visit)    This is a 71 year old right-hand-dominant female who presents with chronic pain at the base of her thumb at the [facility] joint and at the thumb MP joint.  This is been going on for at least 10 years.  She describes weakness numbness hand mostly in the thumb.  She has pain with activity and at night.  She has tried oral and topical NSAIDs, corticosteroid ejections, splinting, and hand therapy.  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   Review of Systems   Objective: Vital Signs: BP (!) 138/91 (BP Location: Left Arm, Patient Position: Sitting, Cuff Size: Large)    Pulse 96    Ht 5\' 1"  (1.549 m)    Wt 262 lb 3.2 oz (118.9 kg)    SpO2 97%    BMI 49.54 kg/m   Physical Exam Constitutional:      Appearance: Normal appearance.  Cardiovascular:     Rate and  Rhythm: Normal rate.     Pulses: Normal pulses.  Pulmonary:     Effort: Pulmonary effort is normal.  Skin:    General: Skin is warm and dry.     Capillary Refill: Capillary refill takes less than 2 seconds.  Neurological:     Mental Status: She is alert.    Right Hand Exam   Tenderness  Right hand tenderness location: TTP at the thumb MP and CMC joints.  Other  Erythema: absent Sensation: normal Pulse: present  Comments:  + CMC grind test.  Static MP hyper-extension.  Pain w/ ROM of the MP joint.  No pain at the IP joint.  Negative Finkelstein test.      Specialty Comments:  No specialty comments available.  Imaging: Multiple views of the right thumb taken today are reviewed interpreted by me.  They demonstrate osteoarthritis of both the [facility] and MP joints.  There is joint space narrowing and large osteophytes at the trapeziometacarpal joint.  She also has joint space narrowing osteophytes at the MP joint.   PMFS History: Patient Active Problem List   Diagnosis Date Noted   Sinusitis 01/14/2021   Annual physical exam 11/13/2020  Panniculitis 11/13/2020   Macromastia 11/13/2020   Right ovarian cyst 07/27/2020   Status post total replacement of left hip 02/17/2020   Unilateral primary osteoarthritis, left hip 02/16/2020   Diarrhea    B12 deficiency 01/08/2018   Vitamin D deficiency 01/08/2018   History of pulmonary embolism 01/08/2018   Status post total left knee replacement 10/13/2017   Family history of thyroid disease 08/25/2017   Chronic pain of left knee 05/20/2017   Unilateral primary osteoarthritis, left knee 05/20/2017   Morbid obesity (HCC) 04/30/2017   Family history of ovarian cancer 02/17/2017   IBS (irritable bowel syndrome) 01/21/2017   Dyspnea 10/08/2016   Paroxysmal atrial fibrillation (HCC) 06/18/2016   Type 2 diabetes mellitus without complication, without long-term current use of insulin (HCC) 01/21/2016   H/O Spinal surgery 12/21/2015    Barrett's esophagus 12/21/2015   Hx of adenomatous polyp of colon 03/01/2002   Past Medical History:  Diagnosis Date   Abnormal thyroid function test    Allergy    Anemia    she attributes previously to fibroids, she declines   Barrett's esophagus    Blood transfusion without reported diagnosis    DDD (degenerative disc disease), cervical    DJD (degenerative joint disease)    Dysrhythmia    GERD (gastroesophageal reflux disease)    Heart murmur    History of bronchitis    History of hiatal hernia    Hx of adenomatous polyp of colon 03/01/2002   Myocardial infarction (HCC)    OA (osteoarthritis)    Obese    Paroxysmal atrial fibrillation (HCC)    PE (pulmonary embolism) 12/21/2015   Plantar fasciitis    Pneumonia    PONV (postoperative nausea and vomiting)    pt. reports that she woke up during surgery   Pre-diabetes    Pulmonary embolism (HCC)     Family History  Problem Relation Age of Onset   Stroke Maternal Grandmother    Bladder Cancer Maternal Grandmother 60   Esophageal cancer Maternal Grandmother 64   Hyperthyroidism Mother        Radioactive Iodine Treatment   Hypothyroidism Mother    Cervical cancer Maternal Aunt 84   Ovarian cancer Daughter 59   Colon cancer Neg Hx    Pancreatic cancer Neg Hx    Rectal cancer Neg Hx    Stomach cancer Neg Hx    Diabetes Neg Hx     Past Surgical History:  Procedure Laterality Date   ABDOMINAL HYSTERECTOMY     Arthroscopic surgery left knee     BACK SURGERY     2005 ,  2017   BIOPSY  07/26/2019   Procedure: BIOPSY;  Surgeon: Iva Boop, MD;  [facility];  Service: Endoscopy;;   CESAREAN SECTION     CHOLECYSTECTOMY     COLONOSCOPY  2003, 2011   3 mm adenoma 2011   COLONOSCOPY WITH PROPOFOL N/A 07/26/2019   Procedure: COLONOSCOPY WITH PROPOFOL;  Surgeon: Iva Boop, MD;  [facility];  Service: Endoscopy;  Laterality: N/A;   ESOPHAGOGASTRODUODENOSCOPY  multiple since 2003    ESOPHAGOGASTRODUODENOSCOPY (EGD) WITH PROPOFOL N/A 07/26/2019   Procedure: ESOPHAGOGASTRODUODENOSCOPY (EGD) WITH PROPOFOL;  Surgeon: Iva Boop, MD;  [facility];  Service: Endoscopy;  Laterality: N/A;   IR GENERIC HISTORICAL  12/19/2015   IR ANGIOGRAM SELECTIVE EACH ADDITIONAL VESSEL 12/19/2015 [name], MD [facility]   IR GENERIC HISTORICAL  12/19/2015   IR ANGIOGRAM PULMONARY BILATERAL SELECTIVE 12/19/2015 Casimiro Needle  Shick, MD [facility]   IR GENERIC HISTORICAL  12/19/2015   IR INFUSION THROMBOL ARTERIAL INITIAL (MS) 12/19/2015 [name], MD [facility]   IR GENERIC HISTORICAL  12/19/2015   IR US GUIDE VASC ACCESS RIGHT 12/19/2015 [name], MD [facility]   IR GENERIC HISTORICAL  12/19/2015   IR [facility] SELECTIVE EACH ADDITIONAL VESSEL 12/19/2015 [name], MD [facility]   IR GENERIC HISTORICAL  12/20/2015   IR THROMB F/U EVAL ART/VEN FINAL DAY (MS) 12/20/2015 Gilmer Mor, DO [facility]   IR GENERIC HISTORICAL  12/19/2015   IR INFUSION THROMBOL ARTERIAL INITIAL (MS) 12/19/2015 [name], MD [facility]   IR RADIOLOGIST EVAL & MGMT  09/19/2020   PLANTAR FASCIA RELEASE     right   POLYPECTOMY  07/26/2019   Procedure: POLYPECTOMY;  Surgeon: Iva Boop, MD;  [facility];  Service: Endoscopy;;   Right knee replacement     SPINAL FUSION  10/31/2015   revision at [facility]    TONSILLECTOMY     TOTAL HIP ARTHROPLASTY Left 02/17/2020   Procedure: LEFT TOTAL HIP ARTHROPLASTY ANTERIOR APPROACH;  Surgeon: Kathryne Hitch, MD;  [facility];  Service: Orthopedics;  Laterality: Left;   TOTAL KNEE ARTHROPLASTY Left 10/13/2017   Procedure: LEFT TOTAL KNEE ARTHROPLASTY;  Surgeon: Kathryne Hitch, MD;  [facility];  Service: Orthopedics;  Laterality: Left;   ULNAR TUNNEL RELEASE     right   VENOUS ABLATION     x 2   Social History   Occupational History    Comment: Telephone sales  Tobacco Use   Smoking  status: Never   Smokeless tobacco: Never  Vaping Use   Vaping Use: Never used  Substance and Sexual Activity   Alcohol use: No   Drug use: No   Sexual activity: Not Currently    Partners: Male    Birth control/protection: Surgical    Comment: Hysterectomy

## 2021-06-05 ENCOUNTER — Other Ambulatory Visit: Payer: Self-pay | Admitting: Sports Medicine

## 2021-06-05 DIAGNOSIS — E538 Deficiency of other specified B group vitamins: Secondary | ICD-10-CM

## 2021-06-21 ENCOUNTER — Other Ambulatory Visit: Payer: Self-pay | Admitting: Sports Medicine

## 2021-06-21 DIAGNOSIS — Z09 Encounter for follow-up examination after completed treatment for conditions other than malignant neoplasm: Secondary | ICD-10-CM

## 2021-06-21 DIAGNOSIS — N6310 Unspecified lump in the right breast, unspecified quadrant: Secondary | ICD-10-CM

## 2021-06-25 ENCOUNTER — Ambulatory Visit
Admission: RE | Admit: 2021-06-25 | Discharge: 2021-06-25 | Disposition: A | Discharge status: Home / Self Care | Payer: Medicare Other | Source: Ambulatory Visit | Attending: Sports Medicine | Admitting: Sports Medicine

## 2021-06-25 ENCOUNTER — Other Ambulatory Visit: Payer: Self-pay | Admitting: Sports Medicine

## 2021-06-25 ENCOUNTER — Other Ambulatory Visit: Payer: Self-pay

## 2021-06-25 DIAGNOSIS — Z09 Encounter for follow-up examination after completed treatment for conditions other than malignant neoplasm: Secondary | ICD-10-CM

## 2021-06-25 DIAGNOSIS — R2231 Localized swelling, mass and lump, right upper limb: Secondary | ICD-10-CM

## 2021-07-11 ENCOUNTER — Other Ambulatory Visit: Payer: No Typology Code available for payment source

## 2021-07-17 ENCOUNTER — Encounter: Payer: Self-pay | Admitting: Sports Medicine

## 2021-08-05 ENCOUNTER — Emergency Department: Payer: Medicare Other

## 2021-08-05 ENCOUNTER — Other Ambulatory Visit: Payer: Self-pay

## 2021-08-05 ENCOUNTER — Emergency Department
Admission: EM | Admit: 2021-08-05 | Discharge: 2021-08-05 | Disposition: A | Discharge status: Home / Self Care | Payer: Medicare Other | Attending: Emergency Medicine | Admitting: Emergency Medicine

## 2021-08-05 ENCOUNTER — Encounter: Payer: Self-pay | Admitting: Intensive Care

## 2021-08-05 DIAGNOSIS — Z20822 Contact with and (suspected) exposure to covid-19: Secondary | ICD-10-CM | POA: Diagnosis not present

## 2021-08-05 DIAGNOSIS — R0789 Other chest pain: Secondary | ICD-10-CM | POA: Diagnosis not present

## 2021-08-05 DIAGNOSIS — E1165 Type 2 diabetes mellitus with hyperglycemia: Secondary | ICD-10-CM | POA: Insufficient documentation

## 2021-08-05 DIAGNOSIS — I4891 Unspecified atrial fibrillation: Secondary | ICD-10-CM | POA: Diagnosis not present

## 2021-08-05 DIAGNOSIS — R531 Weakness: Secondary | ICD-10-CM

## 2021-08-05 DIAGNOSIS — R350 Frequency of micturition: Secondary | ICD-10-CM | POA: Insufficient documentation

## 2021-08-05 LAB — COMPREHENSIVE METABOLIC PANEL WITH GFR
ALT: 14 U/L (ref 0–44)
AST: 21 U/L (ref 15–41)
Albumin: 3.6 g/dL (ref 3.5–5.0)
Alkaline Phosphatase: 69 U/L (ref 38–126)
Anion gap: 11 (ref 5–15)
BUN: 15 mg/dL (ref 8–23)
CO2: 23 mmol/L (ref 22–32)
Calcium: 9.5 mg/dL (ref 8.9–10.3)
Chloride: 105 mmol/L (ref 98–111)
Creatinine, Ser: 0.86 mg/dL (ref 0.44–1.00)
GFR, Estimated: >60 mL/min (ref >60)
Glucose, Bld: 127 mg/dL — ABNORMAL HIGH (ref 70–99)
Potassium: 3.8 mmol/L (ref 3.5–5.1)
Sodium: 139 mmol/L (ref 135–145)
Total Bilirubin: 1.1 mg/dL (ref 0.3–1.2)
Total Protein: 7.4 g/dL (ref 6.5–8.1)

## 2021-08-05 LAB — CBC WITH DIFFERENTIAL/PLATELET
Abs Immature Granulocytes: 0.02 10*3/uL (ref 0.00–0.07)
Basophils Absolute: 0.1 10*3/uL (ref 0.0–0.1)
Basophils Relative: 1 %
Eosinophils Absolute: 0.1 10*3/uL (ref 0.0–0.5)
Eosinophils Relative: 2 %
HCT: 41.9 % (ref 36.0–46.0)
Hemoglobin: 13.6 g/dL (ref 12.0–15.0)
Immature Granulocytes: 0 %
Lymphocytes Relative: 23 %
Lymphs Abs: 1.6 10*3/uL (ref 0.7–4.0)
MCH: 26.4 pg (ref 26.0–34.0)
MCHC: 32.5 g/dL (ref 30.0–36.0)
MCV: 81.4 fL (ref 80.0–100.0)
Monocytes Absolute: 0.5 10*3/uL (ref 0.1–1.0)
Monocytes Relative: 7 %
Neutro Abs: 4.7 10*3/uL (ref 1.7–7.7)
Neutrophils Relative %: 67 %
Platelets: 246 10*3/uL (ref 150–400)
RBC: 5.15 MIL/uL — ABNORMAL HIGH (ref 3.87–5.11)
RDW: 13.3 % (ref 11.5–15.5)
WBC: 7 10*3/uL (ref 4.0–10.5)
nRBC: 0 % (ref 0.0–0.2)

## 2021-08-05 LAB — TROPONIN I (HIGH SENSITIVITY): Troponin I (High Sensitivity): 5 ng/L (ref <18)

## 2021-08-05 LAB — RESP PANEL BY RT-PCR (FLU A&B, COVID) ARPGX2
Influenza A by PCR: NEGATIVE
Influenza B by PCR: NEGATIVE
SARS Coronavirus 2 by RT PCR: NEGATIVE

## 2021-08-05 LAB — URINALYSIS, ROUTINE W REFLEX MICROSCOPIC
Bacteria, UA: NONE SEEN
Bilirubin Urine: NEGATIVE
Glucose, UA: NEGATIVE mg/dL
Ketones, ur: NEGATIVE mg/dL
Nitrite: NEGATIVE
Protein, ur: NEGATIVE mg/dL
Specific Gravity, Urine: 1.009 (ref 1.005–1.030)
pH: 7 (ref 5.0–8.0)

## 2021-08-05 LAB — CBG MONITORING, ED
Glucose-Capillary: 111 mg/dL — ABNORMAL HIGH (ref 70–99)
Glucose-Capillary: 122 mg/dL — ABNORMAL HIGH (ref 70–99)

## 2021-08-05 LAB — D-DIMER, QUANTITATIVE: D-Dimer, Quant: 1.1 ug/mL-FEU — ABNORMAL HIGH (ref 0.00–0.50)

## 2021-08-05 MED ORDER — IOHEXOL 350 MG/ML SOLN
80.0000 mL | Freq: Once | INTRAVENOUS | Status: AC | PRN
  Administered 2021-08-05: 80 mL via INTRAVENOUS

## 2021-08-05 NOTE — ED Provider Notes (Signed)
? ?[facility] ?Provider Note ? ? ? Event Date/Time  ? First MD Initiated Contact with Patient 08/05/21 1525   ?  (approximate) ? ? ?History  ? ?Hyperglycemia and Urinary Frequency ? ? ?HPI ? ?[name] is a 71 y.o. female with past medical history of coronary disease, pulmonary embolism on Eliquis, GERD, prediabetes who presents with lightheadedness and feeling "foggy".  The symptoms been going on for period of about 3 to 4 weeks.  Symptoms are intermittent no clear exacerbating alleviating factor.  Feels like she is in a fog and occasionally lightheaded.  She has had some intermittent left-sided chest pain as well which last for seconds at a time is not exertional and without associated symptoms.  She has had 3 episodes where she felt presyncopal.  Today after 1 of these episodes she checked her blood sugar with her husband's monitor and it was in the 400s which is not typical for her.  She has prediabetes is not on any medication for diabetes.  Patient stopped her Eliquis about 8 days ago because she thinks it makes her feel unwell.  She denies abdominal pain has had nausea but no vomiting chronically has diarrhea.  Denies fevers or chills.  Denies numbness tingling weakness. ? ?  ? ?Past Medical History:  ?Diagnosis Date  ? Abnormal thyroid function test   ? Allergy   ? Anemia   ? she attributes previously to fibroids, she declines  ? Barrett's esophagus   ? Blood transfusion without reported diagnosis   ? DDD (degenerative disc disease), cervical   ? DJD (degenerative joint disease)   ? Dysrhythmia   ? GERD (gastroesophageal reflux disease)   ? Heart murmur   ? History of bronchitis   ? History of hiatal hernia   ? Hx of adenomatous polyp of colon 03/01/2002  ? Myocardial infarction [facility])   ? OA (osteoarthritis)   ? Obese   ? Paroxysmal atrial fibrillation (HCC)   ? PE (pulmonary embolism) 12/21/2015  ? Plantar fasciitis   ? Pneumonia   ? PONV (postoperative nausea and vomiting)    ? pt. reports that she woke up during surgery  ? Pre-diabetes   ? Pulmonary embolism ([place])   ? ? ?Patient Active Problem List  ? Diagnosis Date Noted  ? Arthritis of carpometacarpal [facility]) joint of right thumb 04/25/2021  ? Sinusitis 01/14/2021  ? Annual physical exam 11/13/2020  ? Panniculitis 11/13/2020  ? Macromastia 11/13/2020  ? Right ovarian cyst 07/27/2020  ? Status post total replacement of left hip 02/17/2020  ? Unilateral primary osteoarthritis, left hip 02/16/2020  ? Diarrhea   ? B12 deficiency 01/08/2018  ? Vitamin D deficiency 01/08/2018  ? History of pulmonary embolism 01/08/2018  ? Status post total left knee replacement 10/13/2017  ? Family history of thyroid disease 08/25/2017  ? Chronic pain of left knee 05/20/2017  ? Unilateral primary osteoarthritis, left knee 05/20/2017  ? Morbid obesity ([place]) 04/30/2017  ? Family history of ovarian cancer 02/17/2017  ? IBS (irritable bowel syndrome) 01/21/2017  ? Dyspnea 10/08/2016  ? Paroxysmal atrial fibrillation ([place]) 06/18/2016  ? Type 2 diabetes mellitus without complication, without long-term current use of insulin ([place]) 01/21/2016  ? H/O Spinal surgery 12/21/2015  ? Barrett's esophagus 12/21/2015  ? Hx of adenomatous polyp of colon 03/01/2002  ? ? ? ?Physical Exam  ?Triage Vital Signs: ?ED Triage Vitals  ?Enc Vitals Group  ?   BP --   ?   Pulse --   ?  Resp --   ?   Temp --   ?   Temp src --   ?   SpO2 --   ?   Weight 08/05/21 1436 260 lb (117.9 kg)  ?   Height 08/05/21 1436 '5\' 1"'$  (1.549 m)  ?   Head Circumference --   ?   Peak Flow --   ?   Pain Score 08/05/21 1435 4  ?   Pain Loc --   ?   Pain Edu? --   ?   Excl. in [place]? --   ? ? ?Most recent vital signs: ?Vitals:  ? 08/05/21 1730 08/05/21 1930  ?BP: 135/68 (!) 142/69  ?Pulse: (!) 55 63  ?Resp: 11 16  ?SpO2: 100% 98%  ? ? ? ?General: Awake, no distress.  ?CV:  Good peripheral perfusion.  No edema ?Resp:  Normal effort.  ?Abd:  No distention.  ?Neuro:             Awake, Alert, Oriented x 3   ?Other:  Aox3, nml speech  ?PERRL, EOMI, face symmetric, nml tongue movement  ?5/5 strength in the BL upper and lower extremities  ?Sensation grossly intact in the BL upper and lower extremities  ?Finger-nose-finger intact BL ? ? ? ?ED Results / Procedures / Treatments  ?Labs ?(all labs ordered are listed, but only abnormal results are displayed) ?Labs Reviewed  ?CBC WITH DIFFERENTIAL/PLATELET - Abnormal; Notable for the following components:  ?    Result Value  ? RBC 5.15 (*)   ? All other components within normal limits  ?COMPREHENSIVE METABOLIC PANEL - Abnormal; Notable for the following components:  ? Glucose, Bld 127 (*)   ? All other components within normal limits  ?D-DIMER, QUANTITATIVE - Abnormal; Notable for the following components:  ? D-Dimer, Quant 1.10 (*)   ? All other components within normal limits  ?URINALYSIS, ROUTINE W REFLEX MICROSCOPIC - Abnormal; Notable for the following components:  ? Color, Urine YELLOW (*)   ? APPearance CLEAR (*)   ? Hgb urine dipstick SMALL (*)   ? Leukocytes,Ua TRACE (*)   ? All other components within normal limits  ?CBG MONITORING, ED - Abnormal; Notable for the following components:  ? Glucose-Capillary 111 (*)   ? All other components within normal limits  ?CBG MONITORING, ED - Abnormal; Notable for the following components:  ? Glucose-Capillary 122 (*)   ? All other components within normal limits  ?RESP PANEL BY RT-PCR (FLU A&B, COVID) [id number]  ?TROPONIN I (HIGH SENSITIVITY)  ? ? ? ?EKG ? ?EKG interpreted by myself, normal sinus rhythm, low voltage, left axis deviation, poor R wave progression ? ? ?RADIOLOGY ?I reviewed the CXR which does not show any acute cardiopulmonary process; agree with radiology report  ? ? ? ?PROCEDURES: ? ?Critical Care performed: No ? ?.1-3 Lead EKG Interpretation ?Performed by: Rada Hay, MD ?Authorized by: Rada Hay, MD  ? ?  Interpretation: normal   ?  ECG rate assessment: normal   ?  Ectopy: none   ?  Conduction:  normal   ? ?The patient is on the cardiac monitor to evaluate for evidence of arrhythmia and/or significant heart rate changes. ? ? ?MEDICATIONS ORDERED IN ED: ?Medications  ?iohexol (OMNIPAQUE) 350 MG/ML injection 80 mL (80 mLs Intravenous Contrast Given 08/05/21 1802)  ? ? ? ?IMPRESSION / MDM / ASSESSMENT AND PLAN / ED COURSE  ?I reviewed the triage vital signs and the nursing notes. ?             ?               ? ?  Differential diagnosis includes, but is not limited to, anemia, dysrhythmia, ACS, pulmonary embolism, electrolyte abnormality, vertigo ? ?Patient is a 71 year old female who presents with somewhat of vague complaints today.  Over the last 3 to 4 weeks she has been having some intermittent lightheadedness presyncope and feeling like she is in a fog.  There is really no clear exacerbating factor does not occur upon standing and is really not vertiginous in quality.  When she checked her blood sugar today it was in the 400s.  She also has been off of her Eliquis which she self DC'd which she takes for history of PE.  Patient overall appears well she has an intact neurologic exam.  Abdominal exam is benign.  Reviewed her EKG which does not have any concerning features.  Sugar is 122 here I suspect that this reading at home was spurious.  D-dimer sent from triage is 1.1 so CTA obtained which is negative for pulmonary embolism.  Troponin is also negative.  Overall my suspicion for serious underlying cause of her symptoms is low.  Discussed with her that if she continues to have the symptoms she may need to follow-up with cardiology for Holter monitor to assess for arrhythmias.  I think she is appropriate for outpatient management. ? ?  ? ? ?FINAL CLINICAL IMPRESSION(S) / ED DIAGNOSES  ? ?Final diagnoses:  ?Weakness  ? ? ? ?Rx / DC Orders  ? ?ED Discharge Orders   ? ? None  ? ?  ? ? ? ?Note:  This document was prepared using Dragon voice recognition software and may include unintentional dictation errors. ?   ?Rada Hay, MD ?08/05/21 2345 ? ?

## 2021-08-05 NOTE — ED Triage Notes (Addendum)
Pt c/o "woozy, urinary frequency, diarrhea, and blurry vision" X2 weeks. Reports checking blood sugar with husbands meter and it reading over 400. No known history of diabetes. Reports weight gain recently. Requesting a covid test ?

## 2021-08-05 NOTE — Discharge Instructions (Addendum)
Your CAT scan did not show any blood clot in your lungs.  Cardiac enzymes were negative and the rest of your blood work was all reassuring.  We do not know exactly what is causing your intermittent lightheadedness but if you continue to have symptoms, please follow-up with your primary care provider and you may need to see a cardiologist. ?

## 2021-08-07 ENCOUNTER — Telehealth: Payer: Self-pay | Admitting: General Practice

## 2021-08-07 NOTE — Telephone Encounter (Signed)
Transition Care Management Follow-up Telephone Call ?Date of discharge and from where: 08/05/21 from [facility] ?How have you been since you were released from the hospital? Still in a lot of pain.  ?Any questions or concerns? No ? ?Items Reviewed: ?Did the pt receive and understand the discharge instructions provided? Yes  ?Medications obtained and verified? No  ?Other? No  ?Any new allergies since your discharge? No  ?Dietary orders reviewed? Yes ?Do you have support at home? Yes  ? ?Home Care and Equipment/Supplies: ?Were home health services ordered? no ? ?Functional Questionnaire: (I = Independent and D = Dependent) ?ADLs: I ? ?Bathing/Dressing- I ? ?Meal Prep- I ? ?Eating- I ? ?Maintaining continence- I ? ?Transferring/Ambulation- I ? ?Managing Meds- I ? ?Follow up appointments reviewed: ? ?PCP Hospital f/u appt confirmed? Yes  Scheduled to see PCP on 08/19/21 @ 1530. ?Door Hospital f/u appt confirmed? No   ?Are transportation arrangements needed? No  ?If their condition worsens, is the pt aware to call PCP or go to the Emergency Dept.? Yes ?Was the patient provided with contact information for the PCP's office or ED? Yes ?Was to pt encouraged to call back with questions or concerns? Yes  ?

## 2021-08-19 ENCOUNTER — Inpatient Hospital Stay: Payer: Medicare Other | Admitting: Sports Medicine

## 2021-08-20 ENCOUNTER — Ambulatory Visit (INDEPENDENT_AMBULATORY_CARE_PROVIDER_SITE_OTHER): Payer: Medicare Other | Admitting: Sports Medicine

## 2021-08-20 ENCOUNTER — Encounter: Payer: Self-pay | Admitting: Sports Medicine

## 2021-08-20 DIAGNOSIS — R109 Unspecified abdominal pain: Secondary | ICD-10-CM | POA: Insufficient documentation

## 2021-08-20 DIAGNOSIS — N3001 Acute cystitis with hematuria: Secondary | ICD-10-CM | POA: Diagnosis not present

## 2021-08-20 DIAGNOSIS — N3000 Acute cystitis without hematuria: Secondary | ICD-10-CM | POA: Insufficient documentation

## 2021-08-20 LAB — POCT URINALYSIS DIP (CLINITEK)
Bilirubin, UA: NEGATIVE
Glucose, UA: NEGATIVE mg/dL
Nitrite, UA: NEGATIVE
POC PROTEIN,UA: NEGATIVE
Spec Grav, UA: 1.025 (ref 1.010–1.025)
Urobilinogen, UA: 0.2 E.U./dL
pH, UA: 5.5 (ref 5.0–8.0)

## 2021-08-20 MED ORDER — NITROFURANTOIN MONOHYD MACRO 100 MG PO CAPS: 100.0000 mg | ORAL_CAPSULE | Freq: Two times a day (BID) | ORAL | 0 refills | Status: DC

## 2021-08-20 NOTE — Assessment & Plan Note (Addendum)
This is a very pleasant 70 year old female, 2 weeks ago she felt faint, lightheaded, foggy, and had some mild lower abdominal pain, she went to the emergency department where labs were for the most part unrevealing with the exception of leukocytes and blood in her urine, she stopped her Eliquis. ?Her blood sugar was high at home but subsequent checks on the emergency department glucometer were normal. ?CT angiography of the pulmonary arteries, cardiac enzymes were all normal so she was discharged without further intervention and without any antibiotics or urine culture, unfortunately she continues to have symptoms. ?UTIs in the elderly can present as above, she was unable to provide urine in the office today so she will take a cup home and return at and she was informed to hold off on starting Macrobid until she can put the urine in the cup. 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 ?We will start nitrofurantoin, she does have an allergy to fluoroquinolones. ?Of note she does have a history of diverticulosis, she does have minimal left lower quadrant pain but mostly suprapubic pain, so if the Macrobid does not work we will scan her belly and likely treat her for diverticulitis as well. ?

## 2021-08-20 NOTE — Progress Notes (Signed)
? ? ?  Procedures performed today:   ? ?None. ? ?Independent interpretation of notes and tests performed by another provider:  ? ?None. ? ?Brief History, Exam, Impression, and Recommendations:   ? ?Acute cystitis ?This is a very pleasant 71 year old female, 2 weeks ago she felt faint, lightheaded, foggy, and had some mild lower abdominal pain, she went to the emergency department where labs were for the most part unrevealing with the exception of leukocytes and blood in her urine, she stopped her Eliquis. ?Her blood sugar was high at home but subsequent checks on the emergency department glucometer were normal. ?CT angiography of the pulmonary arteries, cardiac enzymes were all normal so she was discharged without further intervention and without any antibiotics or urine culture, unfortunately she continues to have symptoms. ?UTIs in the elderly can present as above, she was unable to provide urine in the office today so she will take a cup home and return at and she was informed to hold off on starting Macrobid until she can put the urine in the cup. 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 ?We will start nitrofurantoin, she does have an allergy to fluoroquinolones. ?Of note she does have a history of diverticulosis, she does have minimal left lower quadrant pain but mostly suprapubic pain, so if the Macrobid does not work we will scan her belly and likely treat her for diverticulitis as well. ? ? ? ?___________________________________________ ?Gwen Her. Dianah Field, M.D., ABFM., CAQSM. ?Primary Care and Sports Medicine ?[place] ? ?Adjunct Instructor of Family Medicine  ?[employer] ?

## 2021-08-20 NOTE — Addendum Note (Signed)
Addended by: Dema Severin on: 08/20/2021 04:57 PM ? ? Modules accepted: Orders ? ?

## 2021-08-21 LAB — URINE CULTURE
MICRO NUMBER:: 13341423
SPECIMEN QUALITY:: ADEQUATE

## 2021-08-22 ENCOUNTER — Encounter: Payer: Self-pay | Admitting: Sports Medicine

## 2021-09-05 ENCOUNTER — Ambulatory Visit (INDEPENDENT_AMBULATORY_CARE_PROVIDER_SITE_OTHER): Payer: Medicare Other | Admitting: Sports Medicine

## 2021-09-05 ENCOUNTER — Encounter: Payer: Self-pay | Admitting: Sports Medicine

## 2021-09-05 VITALS — BP 109/73 | HR 68

## 2021-09-05 DIAGNOSIS — N3001 Acute cystitis with hematuria: Secondary | ICD-10-CM

## 2021-09-05 DIAGNOSIS — R109 Unspecified abdominal pain: Secondary | ICD-10-CM

## 2021-09-05 DIAGNOSIS — I959 Hypotension, unspecified: Secondary | ICD-10-CM | POA: Diagnosis not present

## 2021-09-05 LAB — POCT URINALYSIS DIP (CLINITEK)
Bilirubin, UA: NEGATIVE
Glucose, UA: NEGATIVE mg/dL
Ketones, POC UA: NEGATIVE mg/dL
Nitrite, UA: NEGATIVE
POC PROTEIN,UA: NEGATIVE
Spec Grav, UA: 1.02 (ref 1.010–1.025)
Urobilinogen, UA: 0.2 E.U./dL
pH, UA: 6.5 (ref 5.0–8.0)

## 2021-09-05 NOTE — Progress Notes (Addendum)
    Procedures performed today:    None.  Independent interpretation of notes and tests performed by another provider:   None.  Brief History, Exam, Impression, and Recommendations:    Abdominal pain Pleasant 71 year old female, she has had several weeks of feeling faint, lightheaded, foggy with lower abdominal pain, in the ED labs were unrevealing with exception of some leukocytes and blood in her urine, she stopped her Eliquis. Blood sugar was high at home but subsequent checks in the emergency department were normal. In the ED she had CT angiography of her pulmonary arteries, cardiac enzymes, all normal and so she was discharged without any further intervention, we suspected untreated UTI so added Macrobid, UTI did grow out Streptococcus agalactiae. Historically however she has grown out pansensitive E. coli. Unfortunately she returns today not feeling much better, still with some dizziness and lightheadedness particularly with standing, and persistent abdominal pain. At this point considering persistent abdominal pain, left lower quadrant we will proceed with CT abdomen pelvis with oral and IV contrast. Checking some labs. For insurance company purposes we are looking for diverticulitis. In the meantime she will hydrate aggressively.  Cultures again growing out group b strep, this is usually just a vaginal contaminant.  We will cover with amoxicillin, but I really need to see the CT scan.  Update: CT scan with diverticulosis, as well as adnexal mass that appears smaller than prior.  Adding Flagyl, if insufficient improvement we will refer to gastroenterology.  Has already had Augmentin, Cipro allergic.  I spent 30 minutes of total time managing this patient today, this includes chart review, face to face, and non-face to face time.  ___________________________________________ Gwen Her. Dianah Field, M.D., ABFM., CAQSM. Primary Care and [place] Instructor of [place] [employer] [facility] [employer]

## 2021-09-05 NOTE — Assessment & Plan Note (Addendum)
Pleasant 71 year old female, she has had several weeks of feeling faint, lightheaded, foggy with lower abdominal pain, in the ED labs were unrevealing with exception of some leukocytes and blood in her urine, she stopped her Eliquis. Blood sugar was high at home but subsequent checks in the emergency department were normal. In the ED she had CT angiography of her pulmonary arteries, cardiac enzymes, all normal and so she was discharged without any further intervention, we suspected untreated UTI so added Macrobid, UTI did grow out Streptococcus agalactiae. Historically however she has grown out pansensitive E. coli. Unfortunately she returns today not feeling much better, still with some dizziness and lightheadedness particularly with standing, and persistent abdominal pain. At this point considering persistent abdominal pain, left lower quadrant we will proceed with CT abdomen pelvis with oral and IV contrast. Checking some labs. For insurance company purposes we are looking for diverticulitis. In the meantime she will hydrate aggressively.  Cultures again growing out group b strep, this is usually just a vaginal contaminant.  We will cover with amoxicillin, but I really need to see the CT scan.  Update: CT scan with diverticulosis, as well as adnexal mass that appears smaller than prior.  Adding Flagyl, if insufficient improvement we will refer to gastroenterology.  Has already had Augmentin, Cipro allergic.

## 2021-09-06 LAB — COMPLETE METABOLIC PANEL WITH GFR
AG Ratio: 1.2 (calc) (ref 1.0–2.5)
ALT: 10 U/L (ref 6–29)
AST: 14 U/L (ref 10–35)
Albumin: 3.6 g/dL (ref 3.6–5.1)
Alkaline phosphatase (APISO): 67 U/L (ref 37–153)
BUN: 14 mg/dL (ref 7–25)
CO2: 24 mmol/L (ref 20–32)
Calcium: 9.3 mg/dL (ref 8.6–10.4)
Chloride: 107 mmol/L (ref 98–110)
Creat: 0.86 mg/dL (ref 0.60–1.00)
Globulin: 3 g/dL (calc) (ref 1.9–3.7)
Glucose, Bld: 137 mg/dL — ABNORMAL HIGH (ref 65–99)
Potassium: 4.2 mmol/L (ref 3.5–5.3)
Sodium: 141 mmol/L (ref 135–146)
Total Bilirubin: 1.1 mg/dL (ref 0.2–1.2)
Total Protein: 6.6 g/dL (ref 6.1–8.1)
eGFR: 73 mL/min/{1.73_m2} (ref >=60)

## 2021-09-06 LAB — CBC WITH DIFFERENTIAL/PLATELET
Absolute Monocytes: 331 cells/uL (ref 200–950)
Basophils Absolute: 58 cells/uL (ref 0–200)
Basophils Relative: 1 %
Eosinophils Absolute: 180 cells/uL (ref 15–500)
Eosinophils Relative: 3.1 %
HCT: 40.8 % (ref 35.0–45.0)
Hemoglobin: 13.5 g/dL (ref 11.7–15.5)
Lymphs Abs: 1467 cells/uL (ref 850–3900)
MCH: 27.4 pg (ref 27.0–33.0)
MCHC: 33.1 g/dL (ref 32.0–36.0)
MCV: 82.8 fL (ref 80.0–100.0)
MPV: 10.5 fL (ref 7.5–12.5)
Monocytes Relative: 5.7 %
Neutro Abs: 3764 cells/uL (ref 1500–7800)
Neutrophils Relative %: 64.9 %
Platelets: 217 10*3/uL (ref 140–400)
RBC: 4.93 10*6/uL (ref 3.80–5.10)
RDW: 13.5 % (ref 11.0–15.0)
Total Lymphocyte: 25.3 %
WBC: 5.8 10*3/uL (ref 3.8–10.8)

## 2021-09-06 LAB — LIPASE: Lipase: 18 U/L (ref 7–60)

## 2021-09-06 LAB — AMYLASE: Amylase: 15 U/L — ABNORMAL LOW (ref 21–101)

## 2021-09-06 LAB — BRAIN NATRIURETIC PEPTIDE: Brain Natriuretic Peptide: 31 pg/mL (ref <100)

## 2021-09-07 ENCOUNTER — Encounter: Payer: Self-pay | Admitting: Sports Medicine

## 2021-09-07 LAB — URINE CULTURE
MICRO NUMBER:: 13418777
SPECIMEN QUALITY:: ADEQUATE

## 2021-09-08 MED ORDER — AMOXICILLIN-POT CLAVULANATE 875-125 MG PO TABS: 1.0000 | ORAL_TABLET | Freq: Two times a day (BID) | ORAL | 0 refills | Status: DC

## 2021-09-08 NOTE — Addendum Note (Signed)
Addended by: Silverio Decamp on: 09/08/2021 09:49 AM   Modules accepted: Orders

## 2021-09-09 ENCOUNTER — Ambulatory Visit (INDEPENDENT_AMBULATORY_CARE_PROVIDER_SITE_OTHER): Payer: Medicare Other

## 2021-09-09 DIAGNOSIS — R109 Unspecified abdominal pain: Secondary | ICD-10-CM

## 2021-09-09 DIAGNOSIS — R1032 Left lower quadrant pain: Secondary | ICD-10-CM

## 2021-09-09 DIAGNOSIS — N3001 Acute cystitis with hematuria: Secondary | ICD-10-CM

## 2021-09-09 MED ORDER — IOHEXOL 350 MG/ML SOLN
100.0000 mL | Freq: Once | INTRAVENOUS | Status: AC | PRN
  Administered 2021-09-09: 100 mL via INTRAVENOUS

## 2021-09-10 ENCOUNTER — Encounter: Payer: Self-pay | Admitting: Sports Medicine

## 2021-09-10 MED ORDER — METRONIDAZOLE 500 MG PO TABS: 500.0000 mg | ORAL_TABLET | Freq: Two times a day (BID) | ORAL | 0 refills | Status: DC

## 2021-09-10 NOTE — Addendum Note (Signed)
Addended by: Silverio Decamp on: 09/10/2021 04:45 PM   Modules accepted: Orders

## 2021-09-12 ENCOUNTER — Encounter: Payer: Self-pay | Admitting: Sports Medicine

## 2021-11-09 IMAGING — CT CT ANGIO CHEST
2 of 6 series · 19 of 36 positions shown · IV contrast (omnipaque)
Comparison: CT angiogram chest, 11/27/2016.

CLINICAL DATA: Lung going shortness of breath and chest pain for
the last month or so.

EXAM:
CT ANGIOGRAPHY CHEST WITH CONTRAST
TECHNIQUE: Multidetector CT imaging of the chest was performed using the
standard protocol during bolus administration of intravenous
contrast. Multiplanar CT image reconstructions and MIPs were
obtained to evaluate the vascular anatomy.
CONTRAST:  75mL OMNIPAQUE IOHEXOL 350 MG/ML SOLN

[Series 8: pe thins · axial · 0.98mm/px · z∈[+1253,+1517]mm · 18 of 419 slices shown]
[im 21/419  lung]
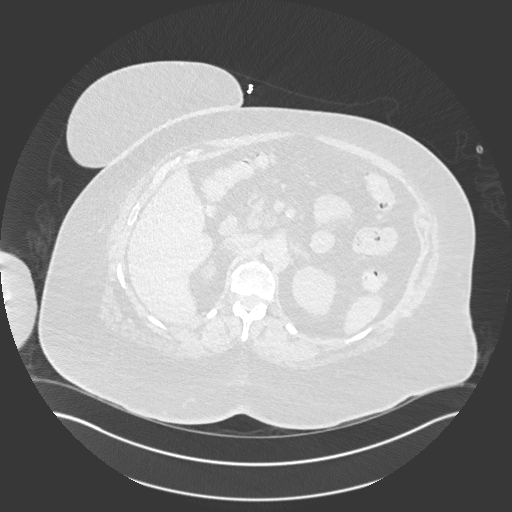
[im 42/419  mediastinal]
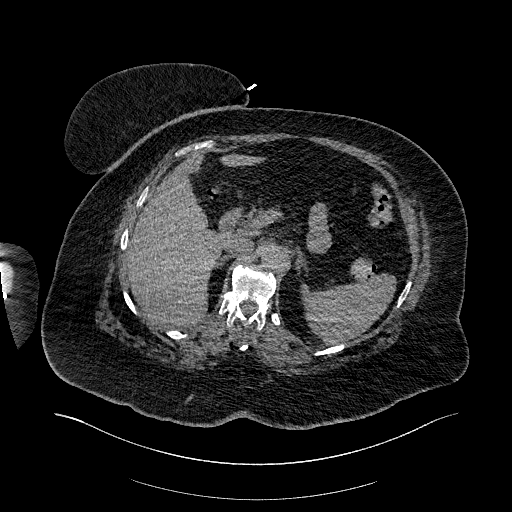
[im 63/419  lung]
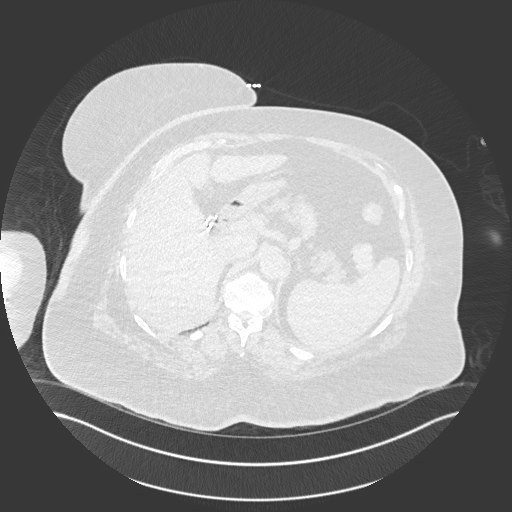
[im 84/419  mediastinal]
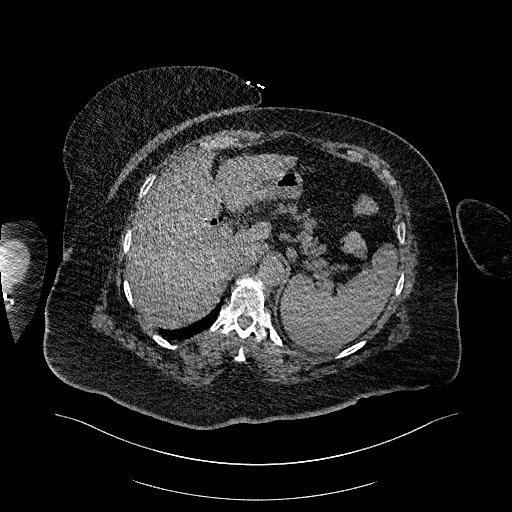
[im 105/419  lung]
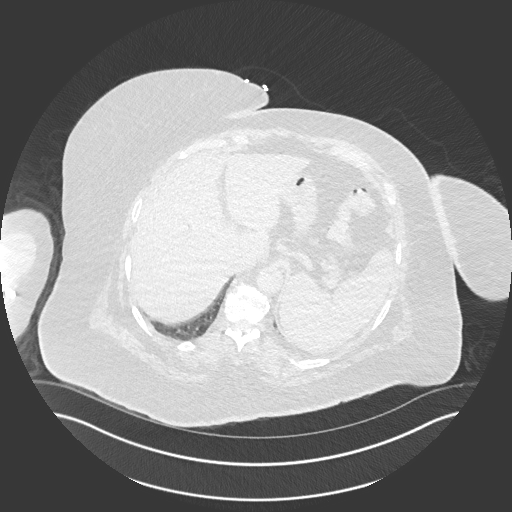
[im 126/419  mediastinal]
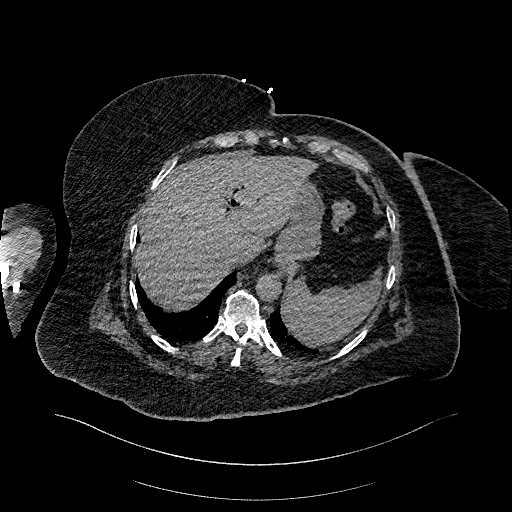
[im 147/419  lung]
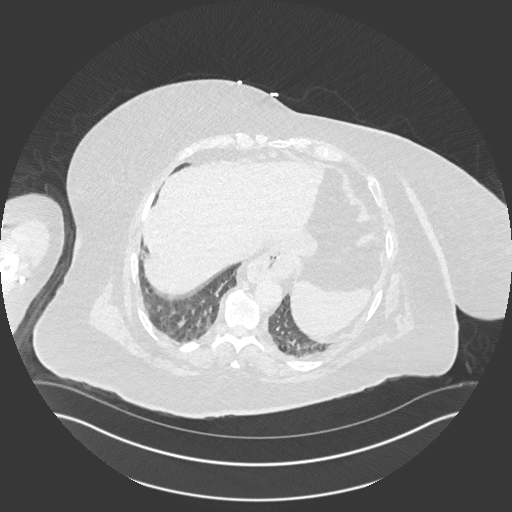
[im 168/419  mediastinal]
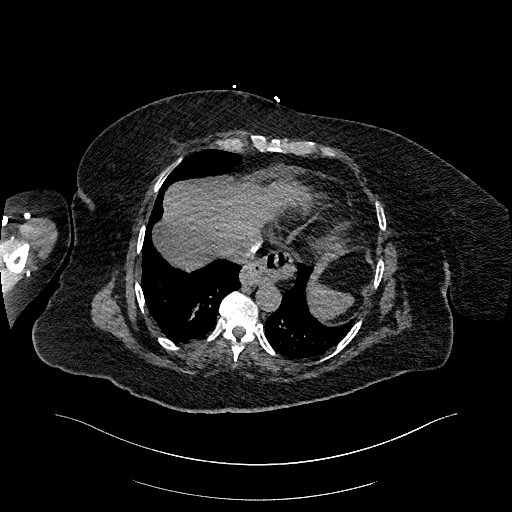
[im 189/419  lung]
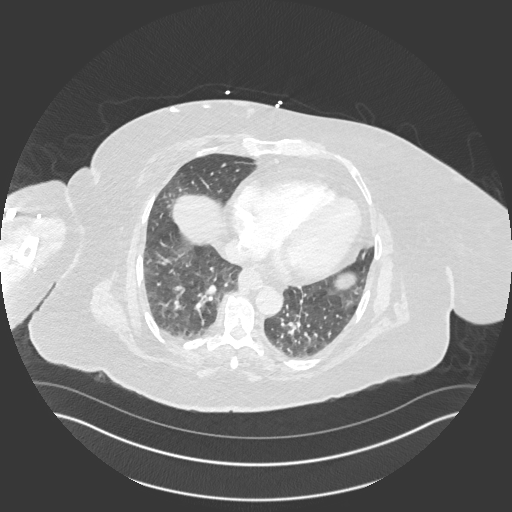
[im 230/419  mediastinal]
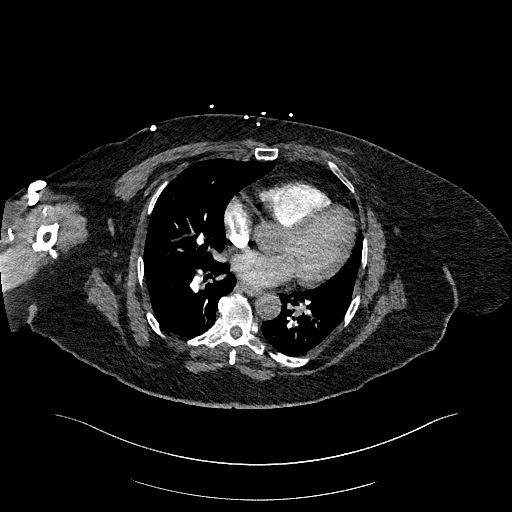
[im 251/419  lung]
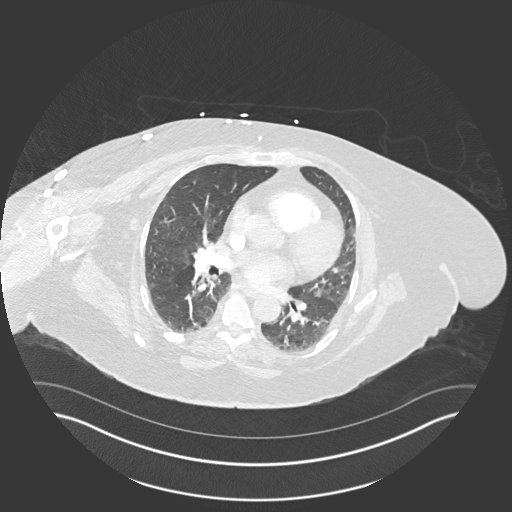
[im 272/419  mediastinal]
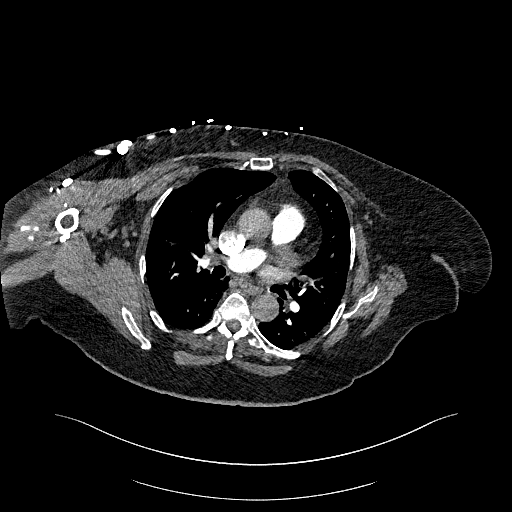
[im 293/419  lung]
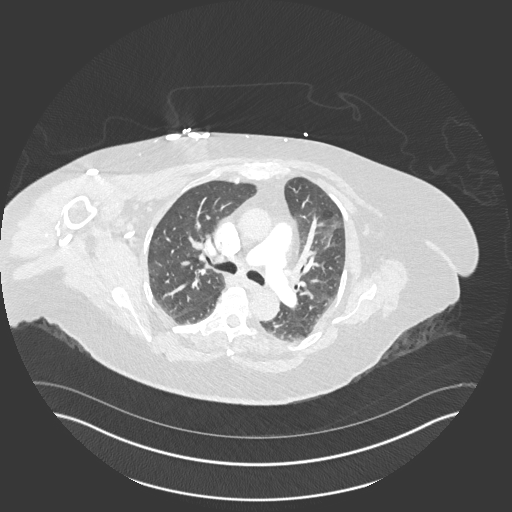
[im 314/419  mediastinal]
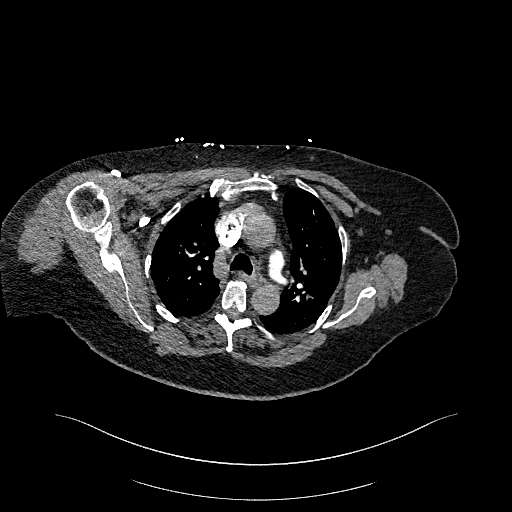
[im 335/419  lung]
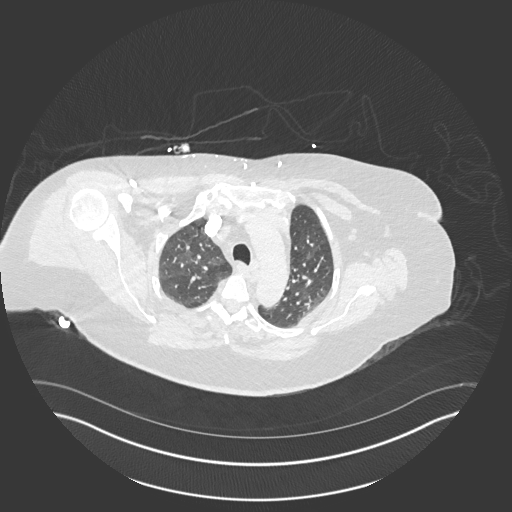
[im 356/419  mediastinal]
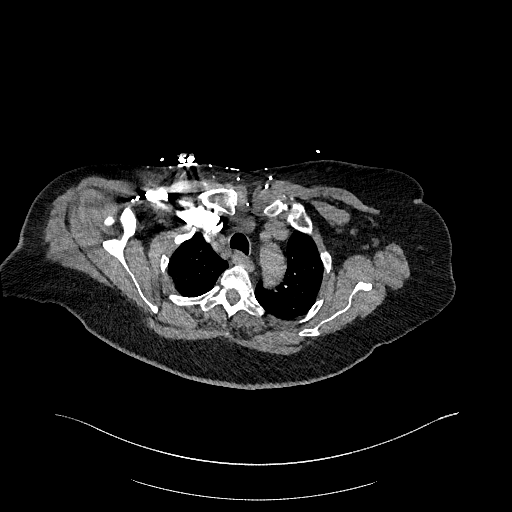
[im 377/419  lung]
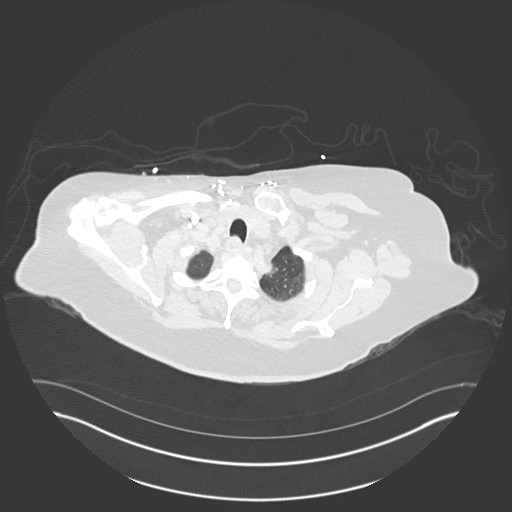
[im 398/419  mediastinal]
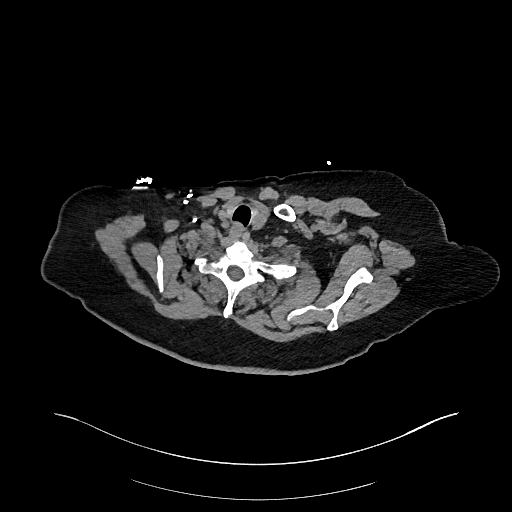

[Series 9: pe 2mm cor · coronal · 0.55mm/px · 1 of 149 slices shown]
[im 75/149  mediastinal]
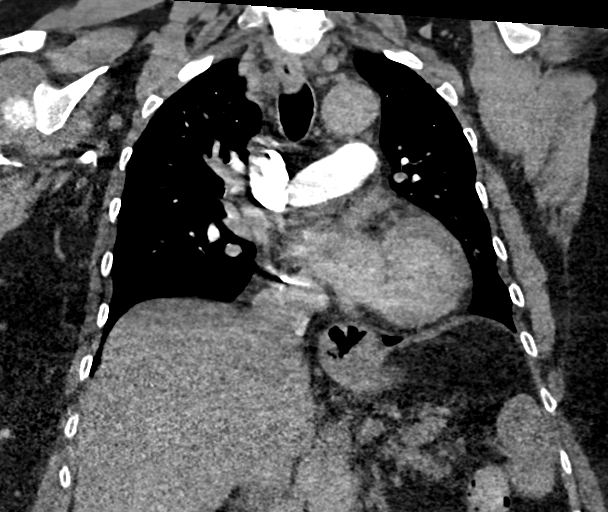

[19 of 36 positions shown; findings below may reference images not displayed]

FINDINGS: Cardiovascular: There is satisfactory opacification of the pulmonary
arteries to the segmental level. There is no evidence of a pulmonary
embolism.

Heart is mildly enlarged. No pericardial effusion. Minor left
coronary artery calcifications. Great vessels are normal in caliber.

Mediastinum/Nodes: Small to moderate hiatal hernia. No mediastinal
or hilar masses. No enlarged lymph nodes. Trachea and esophagus are
unremarkable.

Lungs/Pleura: Patchy areas of hazy opacity are noted in the lungs.
Mild interstitial thickening. No lung mass or nodule. No pleural
effusion or pneumothorax.

Upper Abdomen: Common bile duct and intrahepatic biliary air.
Patient has had a cholecystectomy. This is chronic and was present
on the prior CTA. No acute findings in the upper abdomen.

Musculoskeletal: No fracture or acute finding. No osteoblastic or
osteolytic lesions.

Review of the MIP images confirms the above findings.
IMPRESSION: 1. No evidence of a pulmonary embolism.
2. Relatively faint patchy areas ground-glass opacity in both lungs.
Suspect this is due to shunting related to small airways disease.
Patchy areas edema or infection or felt less likely.
3. Mild cardiomegaly.  Small to moderate hiatal hernia.

## 2021-11-26 ENCOUNTER — Encounter: Payer: Self-pay | Admitting: Sports Medicine

## 2021-12-02 ENCOUNTER — Encounter: Payer: Medicare Other | Admitting: Sports Medicine

## 2021-12-02 NOTE — Progress Notes (Signed)
This encounter was created in error - please disregard.

## 2021-12-30 ENCOUNTER — Ambulatory Visit
Admission: RE | Admit: 2021-12-30 | Discharge: 2021-12-30 | Disposition: A | Discharge status: Home / Self Care | Payer: Medicare Other | Source: Ambulatory Visit | Attending: Sports Medicine | Admitting: Sports Medicine

## 2021-12-30 DIAGNOSIS — Z09 Encounter for follow-up examination after completed treatment for conditions other than malignant neoplasm: Secondary | ICD-10-CM

## 2022-01-05 ENCOUNTER — Encounter: Payer: Self-pay | Admitting: Sports Medicine

## 2022-02-02 ENCOUNTER — Other Ambulatory Visit: Payer: Self-pay | Admitting: Sports Medicine

## 2022-03-05 ENCOUNTER — Other Ambulatory Visit: Payer: Self-pay | Admitting: Sports Medicine

## 2022-03-07 ENCOUNTER — Other Ambulatory Visit: Payer: Self-pay | Admitting: Sports Medicine

## 2022-03-07 ENCOUNTER — Encounter: Payer: Self-pay | Admitting: Sports Medicine

## 2022-03-07 MED ORDER — APIXABAN 5 MG PO TABS: 5.0000 mg | ORAL_TABLET | Freq: Two times a day (BID) | ORAL | 0 refills | Status: DC

## 2022-03-10 ENCOUNTER — Ambulatory Visit (INDEPENDENT_AMBULATORY_CARE_PROVIDER_SITE_OTHER): Payer: Medicare Other | Admitting: Sports Medicine

## 2022-03-10 DIAGNOSIS — Z0000 Encounter for general adult medical examination without abnormal findings: Secondary | ICD-10-CM

## 2022-03-10 NOTE — Progress Notes (Signed)
MEDICARE ANNUAL WELLNESS VISIT  03/10/2022  Telephone Visit Disclaimer This Medicare AWV was conducted by telephone due to national recommendations for restrictions regarding the COVID-19 Pandemic (e.g. social distancing).  I verified, using two identifiers, that I am speaking with [name] or their authorized healthcare agent. I discussed the limitations, risks, security, and privacy concerns of performing an evaluation and management service by telephone and the potential availability of an in-person appointment in the future. The patient expressed understanding and agreed to proceed.  Location of Patient: Home Location of Provider (nurse):  In the office.  Subjective:    [name] is a 71 y.o. female patient of Thekkekandam, Gwen Her, MD who had a Medicare Annual Wellness Visit today via telephone. Joy is Retired and lives with their spouse. she has 3 children. she reports that she is socially active and does interact with friends/family regularly. she is moderately physically active and enjoys reading, sewing, spending time with her grandchildren.  Patient Care Team: Silverio Decamp, MD as PCP - General (Family Medicine) Philemon Kingdom, MD as Consulting Physician (Internal Medicine) Gatha Mayer, MD as Consulting Physician (Gastroenterology) Donnamae Jude, MD as Consulting Physician (Obstetrics and Gynecology) Thompson Grayer, MD as Consulting Physician (Cardiology) Stanford Breed Denice Bors, MD as Consulting Physician (Cardiology) Javier Glazier, MD (Inactive) (Pulmonary Disease)     03/10/2022   10:28 AM 08/05/2021    2:40 PM 11/13/2020    4:05 PM 10/24/2020   10:52 AM 03/12/2020    1:10 PM 02/17/2020    2:00 PM 02/06/2020    1:32 PM  Advanced Directives  Does Patient Have a Medical Advance Directive? No No No No No No No  Would patient like information on creating a medical advance directive? No - Patient declined No - Patient declined Yes  (MAU/Ambulatory/Procedural Areas - Information given) No - Patient declined  No - Patient declined No - Patient declined    Hospital Utilization Over the Past 12 Months: # of hospitalizations or ER visits: 1 # of surgeries: 0  Review of Systems    Patient reports that her overall health is unchanged compared to last year.  History obtained from chart review and the patient  Patient Reported Readings (BP, Pulse, CBG, Weight, etc) none  Pain Assessment Pain : No/denies pain     Current Medications & Allergies (verified) Allergies as of 03/10/2022       Reactions   Ciprofloxacin Anaphylaxis   Doxycycline Anaphylaxis   Metoprolol Anaphylaxis   Strawberry Extract Anaphylaxis   Tetracyclines & Related Anaphylaxis        Medication List        Accurate as of March 10, 2022 10:44 AM. If you have any questions, ask your nurse or doctor.          amoxicillin-clavulanate 875-125 MG tablet Commonly known as: AUGMENTIN Take 1 tablet by mouth 2 (two) times daily.   apixaban 5 MG Tabs tablet Commonly known as: Eliquis Take 1 tablet (5 mg total) by mouth 2 (two) times daily.   aspirin EC 81 MG tablet Take 81 mg by mouth daily. Swallow whole.   [facility] Vitamin B12 1000 MCG tablet Generic drug: cyanocobalamin TAKE 1 TABLET BY MOUTH EVERY DAY   diphenoxylate-atropine 2.5-0.025 MG tablet Commonly known as: LOMOTIL TAKE 1 TABLET BY MOUTH 4 TIMES DAILY AS NEEDED FOR DIARRHEA OR LOOSE STOOLS.   ipratropium 0.06 % nasal spray Commonly known as: ATROVENT Place 2 sprays into both nostrils 4 (  four) times daily. What changed: additional instructions   metroNIDAZOLE 500 MG tablet Commonly known as: FLAGYL Take 1 tablet (500 mg total) by mouth 2 (two) times daily.   multivitamin with minerals Tabs tablet Take 1 tablet by mouth daily.   nitrofurantoin (macrocrystal-monohydrate) 100 MG capsule Commonly known as: MACROBID Take 1 capsule (100 mg total) by mouth 2 (two) times  daily.   Vitamin D (Ergocalciferol) 1.25 MG (50000 UNIT) Caps capsule Commonly known as: DRISDOL Take 1 capsule (50,000 Units total) by mouth every 7 (seven) days. Take for 8 total doses(weeks)   Vitamin D 50 MCG (2000 UT) tablet Take 2,000 Units by mouth daily.        History (reviewed): Past Medical History:  Diagnosis Date   Abnormal thyroid function test    Allergy    Anemia    she attributes previously to fibroids, she declines   Barrett's esophagus    Blood transfusion without reported diagnosis    DDD (degenerative disc disease), cervical    DJD (degenerative joint disease)    Dysrhythmia    GERD (gastroesophageal reflux disease)    Heart murmur    History of bronchitis    History of hiatal hernia    Hx of adenomatous polyp of colon 03/01/2002   Myocardial infarction (HCC)    OA (osteoarthritis)    Obese    Paroxysmal atrial fibrillation (HCC)    PE (pulmonary embolism) 12/21/2015   Plantar fasciitis    Pneumonia    PONV (postoperative nausea and vomiting)    pt. reports that she woke up during surgery   Pre-diabetes    Pulmonary embolism [facility])    Past Surgical History:  Procedure Laterality Date   ABDOMINAL HYSTERECTOMY     Arthroscopic surgery left knee     BACK SURGERY     2005 ,  2017   BIOPSY  07/26/2019   Procedure: BIOPSY;  Surgeon: Gatha Mayer, MD;  Location: [facility];  Service: Endoscopy;;   CESAREAN SECTION     CHOLECYSTECTOMY     COLONOSCOPY  2003, 2011   3 mm adenoma 2011   COLONOSCOPY WITH PROPOFOL N/A 07/26/2019   Procedure: COLONOSCOPY WITH PROPOFOL;  Surgeon: Gatha Mayer, MD;  Location: [facility];  Service: Endoscopy;  Laterality: N/A;   ESOPHAGOGASTRODUODENOSCOPY  multiple since 2003   ESOPHAGOGASTRODUODENOSCOPY (EGD) WITH PROPOFOL N/A 07/26/2019   Procedure: ESOPHAGOGASTRODUODENOSCOPY (EGD) WITH PROPOFOL;  Surgeon: Gatha Mayer, MD;  Location: [facility];  Service: Endoscopy;  Laterality: N/A;   IR GENERIC HISTORICAL   12/19/2015   IR ANGIOGRAM SELECTIVE EACH ADDITIONAL VESSEL 12/19/2015 Greggory Keen, MD [facility]   IR GENERIC HISTORICAL  12/19/2015   IR ANGIOGRAM PULMONARY BILATERAL SELECTIVE 12/19/2015 Greggory Keen, MD [facility]   IR GENERIC HISTORICAL  12/19/2015   IR INFUSION THROMBOL ARTERIAL INITIAL (MS) 12/19/2015 Greggory Keen, MD [facility]   IR GENERIC HISTORICAL  12/19/2015   IR US GUIDE VASC ACCESS RIGHT 12/19/2015 Greggory Keen, MD [facility]   IR GENERIC HISTORICAL  12/19/2015   IR [facility] SELECTIVE EACH ADDITIONAL VESSEL 12/19/2015 Greggory Keen, MD [facility]   IR GENERIC HISTORICAL  12/20/2015   IR THROMB F/U EVAL ART/VEN FINAL DAY (MS) 12/20/2015 Corrie Mckusick, DO [facility]   IR GENERIC HISTORICAL  12/19/2015   IR INFUSION THROMBOL ARTERIAL INITIAL (MS) 12/19/2015 Greggory Keen, MD [facility]   IR RADIOLOGIST EVAL & MGMT  09/19/2020   PLANTAR FASCIA RELEASE     right  POLYPECTOMY  07/26/2019   Procedure: POLYPECTOMY;  Surgeon: Gatha Mayer, MD;  Location: [facility];  Service: Endoscopy;;   Right knee replacement     SPINAL FUSION  10/31/2015   revision at [place] Left 02/17/2020   Procedure: LEFT TOTAL HIP ARTHROPLASTY ANTERIOR APPROACH;  Surgeon: Mcarthur Rossetti, MD;  Location: [facility];  Service: Orthopedics;  Laterality: Left;   TOTAL KNEE ARTHROPLASTY Left 10/13/2017   Procedure: LEFT TOTAL KNEE ARTHROPLASTY;  Surgeon: Mcarthur Rossetti, MD;  Location: [facility];  Service: Orthopedics;  Laterality: Left;   ULNAR TUNNEL RELEASE     right   VENOUS ABLATION     x 2   Family History  Problem Relation Age of Onset   Stroke Maternal Grandmother    Bladder Cancer Maternal Grandmother 65   Esophageal cancer Maternal Grandmother 47   Hyperthyroidism Mother        Radioactive Iodine Treatment   Hypothyroidism Mother    Cervical cancer Maternal Aunt 85   Ovarian cancer Daughter 26   Colon cancer Neg  Hx    Pancreatic cancer Neg Hx    Rectal cancer Neg Hx    Stomach cancer Neg Hx    Diabetes Neg Hx    Social History   Socioeconomic History   Marital status: Married    Spouse name: Iona Beard   Number of children: 3   Years of education: 14   Highest education level: Associate degree: academic program  Occupational History    Comment: Freight forwarder   Occupation: Retired.  Tobacco Use   Smoking status: Never   Smokeless tobacco: Never  Vaping Use   Vaping Use: Never used  Substance and Sexual Activity   Alcohol use: No   Drug use: No   Sexual activity: Not Currently    Partners: Male    Birth control/protection: Surgical    Comment: Hysterectomy  Other Topics Concern   Not on file  Social History Narrative   Lives with her husband. Her mom lives next door. Enjoys reading, sewing, spending time with her grandchildren.   Social Determinants of Health   Financial Resource Strain: Low Risk  (03/10/2022)   Overall Financial Resource Strain (CARDIA)    Difficulty of Paying Living Expenses: Not hard at all  Food Insecurity: No Food Insecurity (03/10/2022)   Hunger Vital Sign    Worried About Running Out of Food in the Last Year: Never true    Ran Out of Food in the Last Year: Never true  Transportation Needs: No Transportation Needs (03/10/2022)   PRAPARE - Hydrologist (Medical): No    Lack of Transportation (Non-Medical): No  Physical Activity: Insufficiently Active (03/10/2022)   Exercise Vital Sign    Days of Exercise per Week: 2 days    Minutes of Exercise per Session: 40 min  Stress: No Stress Concern Present (03/10/2022)   [place]    Feeling of Stress : Not at all  Social Connections: Moderately Isolated (03/10/2022)   Social Connection and Isolation Panel [NHANES]    Frequency of Communication with Friends and Family: More than three times a week    Frequency of  Social Gatherings with Friends and Family: More than three times a week    Attends Religious Services: Never    Marine scientist or Organizations: No    Attends [employer]  or Organization Meetings: Never    Marital Status: Married    Activities of Daily Living    03/10/2022   10:32 AM  In your present state of health, do you have any difficulty performing the following activities:  Hearing? 0  Vision? 0  Difficulty concentrating or making decisions? 0  Walking or climbing stairs? 1  Comment difficulty with climbing stairs.  Dressing or bathing? 0  Doing errands, shopping? 0  Preparing Food and eating ? N  Using the Toilet? N  In the past six months, have you accidently leaked urine? N  Do you have problems with loss of bowel control? Y  Comment Occasionally; when she has diarrhea.  Managing your Medications? N  Managing your Finances? N  Housekeeping or managing your Housekeeping? N    Patient Education/ Literacy How often do you need to have someone help you when you read instructions, pamphlets, or other written materials from your doctor or pharmacy?: 1 - Never What is the last grade level you completed in school?: associates degree  Exercise Current Exercise Habits: Home exercise routine, Type of exercise: walking, Time (Minutes): 40, Frequency (Times/Week): 2, Weekly Exercise (Minutes/Week): 80, Intensity: Moderate, Exercise limited by: None identified  Diet Patient reports consuming 2 meals a day and 0 snack(s) a day Patient reports that her primary diet is: Regular Patient reports that she does have regular access to food.   Depression Screen    03/10/2022   10:28 AM 11/13/2020    4:05 PM  PHQ 2/9 Scores  PHQ - 2 Score 0 4  PHQ- 9 Score  15     Fall Risk    03/10/2022   10:28 AM 11/13/2020    3:39 PM  Fall Risk   Falls in the past year? 0 1  Number falls in past yr: 0 1  Injury with Fall? 0 0  Risk for fall due to : No Fall Risks History of fall(s)   Follow up Falls evaluation completed Education provided     Objective:  LEETA GRIMME seemed alert and oriented and she participated appropriately during our telephone visit.  Blood Pressure Weight BMI  BP Readings from Last 3 Encounters:  09/05/21 109/73  08/20/21 129/80  08/05/21 (!) 142/69   Wt Readings from Last 3 Encounters:  08/20/21 265 lb (120.2 kg)  08/05/21 260 lb (117.9 kg)  04/25/21 262 lb 3.2 oz (118.9 kg)   BMI Readings from Last 1 Encounters:  08/20/21 50.07 kg/m    *Unable to obtain current vital signs, weight, and BMI due to telephone visit type  Hearing/Vision  Jataya did not seem to have difficulty with hearing/understanding during the telephone conversation Reports that she has had a formal eye exam by an eye care professional within the past year Reports that she has not had a formal hearing evaluation within the past year *Unable to fully assess hearing and vision during telephone visit type  Cognitive Function:    03/10/2022   10:36 AM  6CIT Screen  What Year? 0 points  What month? 0 points  What time? 0 points  Count back from 20 0 points  Months in reverse 0 points  Repeat phrase 0 points  Total Score 0 points   (Normal:0-7, Significant for Dysfunction: >8)  Normal Cognitive Function Screening: Yes   Immunization & Health Maintenance Record Immunization History  Administered Date(s) Administered   Influenza, High Dose Seasonal PF 02/25/2017, 03/02/2018   Influenza-Unspecified 02/09/2021   Moderna Sars-Covid-2 Vaccination 05/07/2019,  06/18/2019    Health Maintenance  Topic Date Due   OPHTHALMOLOGY EXAM  03/10/2022 (Originally 10/01/1960)   FOOT EXAM  03/11/2022 (Originally 11/13/2021)   HEMOGLOBIN A1C  03/15/2022 (Originally 08/26/2021)   COVID-19 Vaccine (3 - Moderna risk series) 03/26/2022 (Originally 07/16/2019)   Zoster Vaccines- Shingrix (1 of 2) 06/10/2022 (Originally 10/01/1969)   INFLUENZA VACCINE  07/20/2022 (Originally  11/19/2021)   Pneumonia Vaccine 52+ Years old (1 - PCV) 03/11/2023 (Originally 10/02/2015)   Hepatitis C Screening  03/11/2023 (Originally 10/01/1968)   Diabetic kidney evaluation - Urine ACR  03/11/2027 (Originally 11/21/2021)   Diabetic kidney evaluation - GFR measurement  09/06/2022   Medicare Annual Wellness (AWV)  03/11/2023   MAMMOGRAM  12/31/2023   COLONOSCOPY (Pts 45-55yr Insurance coverage will need to be confirmed)  07/25/2029   DEXA SCAN  Completed   HPV VACCINES  Aged Out       Assessment  This is a routine wellness examination for MTIERANY APPLEBY  Health Maintenance: Due or Overdue There are no preventive care reminders to display for this patient.   MLoralyn Freshwaterdoes not need a referral for Community Assistance: Care Management:   no Social Work:    no Prescription Assistance:  no Nutrition/Diabetes Education:  no   Plan:  Personalized Goals  Goals Addressed               This Visit's Progress     Patient Stated (pt-stated)        Patient stated that she would like to loose 100lbs.       Personalized Health Maintenance & Screening Recommendations  Pneumococcal vaccine  Influenza vaccine Shingles vaccine TD vaccine Eye exam Foot exam Hemoglobin A1C  Hep C screening Urine ACR  Lung Cancer Screening Recommended: no (Low Dose CT Chest recommended if Age 71-80years, 30 pack-year currently smoking OR have quit w/in past 15 years) Hepatitis C Screening recommended: yes HIV Screening recommended: no  Advanced Directives: Written information was not prepared per patient's request.  Referrals & Orders No orders of the defined types were placed in this encounter.   Follow-up Plan Follow-up with TSilverio Decamp MD as planned Schedule influenza, shingles, td vaccine and pneumonia vaccine. Medicare wellness visit in one year. Patient will access AVS on my chart.   I have personally reviewed and noted the following in the patient's  chart:   Medical and social history Use of alcohol, tobacco or illicit drugs  Current medications and supplements Functional ability and status Nutritional status Physical activity Advanced directives List of other physicians Hospitalizations, surgeries, and ER visits in previous 12 months Vitals Screenings to include cognitive, depression, and falls Referrals and appointments  In addition, I have reviewed and discussed with MLoralyn Freshwatercertain preventive protocols, quality metrics, and best practice recommendations. A written personalized care plan for preventive services as well as general preventive health recommendations is available and can be mailed to the patient at her request.      BTinnie Gens RN BSN  03/10/2022

## 2022-03-10 NOTE — Patient Instructions (Signed)
[facility] Maintenance Summary and Written Plan of Care  [name] ,  Thank you for allowing me to perform your Medicare Annual Wellness Visit and for your ongoing commitment to your health.   Health Maintenance & Immunization History Health Maintenance  Topic Date Due   OPHTHALMOLOGY EXAM  03/10/2022 (Originally 10/01/1960)   FOOT EXAM  03/11/2022 (Originally 11/13/2021)   HEMOGLOBIN A1C  03/15/2022 (Originally 08/26/2021)   COVID-19 Vaccine (3 - Moderna risk series) 03/26/2022 (Originally 07/16/2019)   Zoster Vaccines- Shingrix (1 of 2) 06/10/2022 (Originally 10/01/1969)   INFLUENZA VACCINE  07/20/2022 (Originally 11/19/2021)   Pneumonia Vaccine 26+ Years old (1 - PCV) 03/11/2023 (Originally 10/02/2015)   Hepatitis C Screening  03/11/2023 (Originally 10/01/1968)   Diabetic kidney evaluation - Urine ACR  03/11/2027 (Originally 11/21/2021)   Diabetic kidney evaluation - GFR measurement  09/06/2022   Medicare Annual Wellness (AWV)  03/11/2023   MAMMOGRAM  12/31/2023   COLONOSCOPY (Pts 45-35yr Insurance coverage will need to be confirmed)  07/25/2029   DEXA SCAN  Completed   HPV VACCINES  Aged Out   Immunization History  Administered Date(s) Administered   Influenza, High Dose Seasonal PF 02/25/2017, 03/02/2018   Influenza-Unspecified 02/09/2021   Moderna Sars-Covid-2 Vaccination 05/07/2019, 06/18/2019    These are the patient goals that we discussed:  Goals Addressed               This Visit's Progress     Patient Stated (pt-stated)        Patient stated that she would like to loose 100lbs.         This is a list of Health Maintenance Items that are overdue or due now: Pneumococcal vaccine  Influenza vaccine Shingles vaccine Eye exam Foot exam Hemoglobin A1C  Hep C screening Urine ACR    Orders/Referrals Placed Today: No orders of the defined types were placed in this encounter.  (Contact our referral department at [phone]  you have not spoken with someone about your referral appointment within the next 5 days)    Follow-up Plan Follow-up with TSilverio Decamp MD as planned Schedule influenza, shingles, td vaccine and pneumonia vaccine. Medicare wellness visit in one year. Patient will access AVS on my chart.       Health Maintenance, Female Adopting a healthy lifestyle and getting preventive care are important in promoting health and wellness. Ask your health care provider about: The right schedule for you to have regular tests and exams. Things you can do on your own to prevent diseases and keep yourself healthy. What should I know about diet, weight, and exercise? Eat a healthy diet  Eat a diet that includes plenty of vegetables, fruits, low-fat dairy products, and lean protein. Do not eat a lot of foods that are high in solid fats, added sugars, or sodium. Maintain a healthy weight Body mass index (BMI) is used to identify weight problems. It estimates body fat based on height and weight. Your health care provider can help determine your BMI and help you achieve or maintain a healthy weight. Get regular exercise Get regular exercise. This is one of the most important things you can do for your health. Most adults should: Exercise for at least 150 minutes each week. The exercise should increase your heart rate and make you sweat (moderate-intensity exercise). Do strengthening exercises at least twice a week. This is in addition to the moderate-intensity exercise. Spend less time sitting. Even light physical activity can be beneficial.  Watch cholesterol and blood lipids Have your blood tested for lipids and cholesterol at 71 years of age, then have this test every 5 years. Have your cholesterol levels checked more often if: Your lipid or cholesterol levels are high. You are older than 71 years of age. You are at high risk for heart disease. What should I know about cancer screening? Depending  on your health history and family history, you may need to have cancer screening at various ages. This may include screening for: Breast cancer. Cervical cancer. Colorectal cancer. Skin cancer. Lung cancer. What should I know about heart disease, diabetes, and high blood pressure? Blood pressure and heart disease High blood pressure causes heart disease and increases the risk of stroke. This is more likely to develop in people who have high blood pressure readings or are overweight. Have your blood pressure checked: Every 3-5 years if you are 82-36 years of age. Every year if you are 63 years old or older. Diabetes Have regular diabetes screenings. This checks your fasting blood sugar level. Have the screening done: Once every three years after age 23 if you are at a normal weight and have a low risk for diabetes. More often and at a younger age if you are overweight or have a high risk for diabetes. What should I know about preventing infection? Hepatitis B If you have a higher risk for hepatitis B, you should be screened for this virus. Talk with your health care provider to find out if you are at risk for hepatitis B infection. Hepatitis C Testing is recommended for: Everyone born from [birth year] through 1965. Anyone with known risk factors for hepatitis C. Sexually transmitted infections (STIs) Get screened for STIs, including gonorrhea and chlamydia, if: You are sexually active and are younger than 71 years of age. You are older than 71 years of age and your health care provider tells you that you are at risk for this type of infection. Your sexual activity has changed since you were last screened, and you are at increased risk for chlamydia or gonorrhea. Ask your health care provider if you are at risk. Ask your health care provider about whether you are at high risk for HIV. Your health care provider may recommend a prescription medicine to help prevent HIV infection. If you choose to  take medicine to prevent HIV, you should first get tested for HIV. You should then be tested every 3 months for as long as you are taking the medicine. Pregnancy If you are about to stop having your period (premenopausal) and you may become pregnant, seek counseling before you get pregnant. Take 400 to 800 micrograms (mcg) of folic acid every day if you become pregnant. Ask for birth control (contraception) if you want to prevent pregnancy. Osteoporosis and menopause Osteoporosis is a disease in which the bones lose minerals and strength with aging. This can result in bone fractures. If you are 56 years old or older, or if you are at risk for osteoporosis and fractures, ask your health care provider if you should: Be screened for bone loss. Take a calcium or vitamin D supplement to lower your risk of fractures. Be given hormone replacement therapy (HRT) to treat symptoms of menopause. Follow these instructions at home: Alcohol use Do not drink alcohol if: Your health care provider tells you not to drink. You are pregnant, may be pregnant, or are planning to become pregnant. If you drink alcohol: Limit how much you have to: 0-1 drink a  day. Know how much alcohol is in your drink. In the [place] one drink equals one 12 oz bottle of beer (355 mL), one 5 oz glass of wine (148 mL), or one 1 oz glass of hard liquor (44 mL). Lifestyle Do not use any products that contain nicotine or tobacco. These products include cigarettes, chewing tobacco, and vaping devices, such as e-cigarettes. If you need help quitting, ask your health care provider. Do not use street drugs. Do not share needles. Ask your health care provider for help if you need support or information about quitting drugs. General instructions Schedule regular health, dental, and eye exams. Stay current with your vaccines. Tell your health care provider if: You often feel depressed. You have ever been abused or do not feel safe at  home. Summary Adopting a healthy lifestyle and getting preventive care are important in promoting health and wellness. Follow your health care provider's instructions about healthy diet, exercising, and getting tested or screened for diseases. Follow your health care provider's instructions on monitoring your cholesterol and blood pressure. This information is not intended to replace advice given to you by your health care provider. Make sure you discuss any questions you have with your health care provider. Document Revised: 08/27/2020 Document Reviewed: 08/27/2020 Elsevier Patient Education  [place].

## 2022-03-19 ENCOUNTER — Other Ambulatory Visit: Payer: Self-pay | Admitting: Sports Medicine

## 2022-03-19 MED ORDER — DIPHENOXYLATE-ATROPINE 2.5-0.025 MG PO TABS: 1.0000 | ORAL_TABLET | Freq: Four times a day (QID) | ORAL | 0 refills | Status: DC | PRN

## 2022-03-19 NOTE — Telephone Encounter (Signed)
To PCP

## 2022-03-31 ENCOUNTER — Ambulatory Visit (INDEPENDENT_AMBULATORY_CARE_PROVIDER_SITE_OTHER): Payer: Medicare Other | Admitting: Sports Medicine

## 2022-03-31 ENCOUNTER — Encounter: Payer: Self-pay | Admitting: Sports Medicine

## 2022-03-31 VITALS — BP 172/109 | HR 87 | Wt 265.0 lb

## 2022-03-31 DIAGNOSIS — R109 Unspecified abdominal pain: Secondary | ICD-10-CM

## 2022-03-31 DIAGNOSIS — E119 Type 2 diabetes mellitus without complications: Secondary | ICD-10-CM

## 2022-03-31 DIAGNOSIS — I10 Essential (primary) hypertension: Secondary | ICD-10-CM

## 2022-03-31 DIAGNOSIS — Z23 Encounter for immunization: Secondary | ICD-10-CM | POA: Diagnosis not present

## 2022-03-31 DIAGNOSIS — Z0000 Encounter for general adult medical examination without abnormal findings: Secondary | ICD-10-CM | POA: Diagnosis not present

## 2022-03-31 DIAGNOSIS — E538 Deficiency of other specified B group vitamins: Secondary | ICD-10-CM

## 2022-03-31 DIAGNOSIS — R350 Frequency of micturition: Secondary | ICD-10-CM

## 2022-03-31 MED ORDER — SHINGRIX 50 MCG/0.5ML IM SUSR: 0.5000 mL | Freq: Once | INTRAMUSCULAR | 0 refills | Status: AC

## 2022-03-31 MED ORDER — TETANUS-DIPHTH-ACELL PERTUSSIS 5-2.5-18.5 LF-MCG/0.5 IM SUSY: 0.5000 mL | PREFILLED_SYRINGE | Freq: Once | INTRAMUSCULAR | 0 refills | Status: AC

## 2022-03-31 NOTE — Assessment & Plan Note (Addendum)
Pleasant 71 year old female, she has a fairly extensive history of abdominal pain, feeling lightheaded, faint, foggy. She did have some leukocytosis and an ED visit several months ago. Blood sugar was high but checks in the ED were normal. Ultimately she had CT angiography of her pulmonary arteries, she was treated for a UTI. Over the past several months she is continue to have episodic abdominal pain, now worse with eating to the point where she only eats 2 meals per day, she has not noted any weight loss. She describes the pain as periumbilical and midepigastric, cramping in nature. Back in May we obtained a CT scan that showed diverticulosis without obvious diverticulitis. We did add Flagyl, Augmentin, she is Cipro allergic. Unfortunately continues to have abdominal discomfort, I would like her to weigh in with gastroenterology. I will check some celiac labs in the meantime to get her teed up.  Update: There is pyuria on urinalysis, awaiting culture results, adding Omnicef, I would also like to get a little better control of her diabetes as this will likely improve her infection rate.  She is not on an SGLT2 inhibitor.

## 2022-03-31 NOTE — Assessment & Plan Note (Signed)
Due for multiple labs, screening measures including flu, tdap, shingles, pneumococcal vaccines. Flu today, pneumococcal 20 nurse visit in 2 weeks.

## 2022-03-31 NOTE — Assessment & Plan Note (Signed)
Blood pressure elevated today, she will check it at home, she will let me know the readings over 2 weeks and we can add an ACE/ARB if still elevated.

## 2022-03-31 NOTE — Progress Notes (Signed)
    Procedures performed today:    None.  Independent interpretation of notes and tests performed by another provider:   None.  Brief History, Exam, Impression, and Recommendations:    Abdominal pain Pleasant 71 year old female, she has a fairly extensive history of abdominal pain, feeling lightheaded, faint, foggy. She did have some leukocytosis and an ED visit several months ago. Blood sugar was high but checks in the ED were normal. Ultimately she had CT angiography of her pulmonary arteries, she was treated for a UTI. Over the past several months she is continue to have episodic abdominal pain, now worse with eating to the point where she only eats 2 meals per day, she has not noted any weight loss. She describes the pain as periumbilical and midepigastric, cramping in nature. Back in May we obtained a CT scan that showed diverticulosis without obvious diverticulitis. We did add Flagyl, Augmentin, she is Cipro allergic. Unfortunately continues to have abdominal discomfort, I would like her to weigh in with gastroenterology. I will check some celiac labs in the meantime to get her teed up.  Update: There is pyuria on urinalysis, awaiting culture results, adding Omnicef, I would also like to get a little better control of her diabetes as this will likely improve her infection rate.  She is not on an SGLT2 inhibitor.   Annual physical exam Due for multiple labs, screening measures including flu, tdap, shingles, pneumococcal vaccines. Flu today, pneumococcal 20 nurse visit in 2 weeks.  Urinary frequency Has been treated for UTI, rechecking UA, if negative, suspect OAB and will treat.  Benign essential hypertension Blood pressure elevated today, she will check it at home, she will let me know the readings over 2 weeks and we can add an ACE/ARB if still elevated.  Type 2 diabetes mellitus without complication, without long-term current use of insulin (HCC) A1c is 7.5%, I would  like to get better control in the hopes of decreasing her urinary tract infections. Starting metformin XR. We can recheck A1c in 3 months.  I spent 40 minutes of total time managing this patient today, this includes chart review, face to face, and non-face to face time.  ____________________________________________ Gwen Her. Dianah Field, M.D., ABFM., CAQSM., AME. Primary Care and Sports Medicine [facility]  Adjunct Professor of [employer]  Risk manager

## 2022-03-31 NOTE — Assessment & Plan Note (Signed)
Has been treated for UTI, rechecking UA, if negative, suspect OAB and will treat.

## 2022-04-01 MED ORDER — CEFDINIR 300 MG PO CAPS: 300.0000 mg | ORAL_CAPSULE | Freq: Two times a day (BID) | ORAL | 0 refills | Status: DC

## 2022-04-01 MED ORDER — METFORMIN HCL ER 750 MG PO TB24: 750.0000 mg | ORAL_TABLET | Freq: Every day | ORAL | 3 refills | Status: DC

## 2022-04-01 NOTE — Addendum Note (Signed)
Addended by: Silverio Decamp on: 04/01/2022 08:54 AM   Modules accepted: Orders

## 2022-04-01 NOTE — Addendum Note (Signed)
Addended by: Silverio Decamp on: 04/01/2022 11:06 AM   Modules accepted: Orders

## 2022-04-01 NOTE — Assessment & Plan Note (Addendum)
A1c is 7.5%, I would like to get better control in the hopes of decreasing her urinary tract infections. Starting metformin XR. We can recheck A1c in 3 months.

## 2022-04-11 ENCOUNTER — Other Ambulatory Visit: Payer: Self-pay | Admitting: Sports Medicine

## 2022-04-11 ENCOUNTER — Encounter: Payer: Self-pay | Admitting: Sports Medicine

## 2022-04-11 LAB — URINALYSIS W MICROSCOPIC + REFLEX CULTURE
Bilirubin Urine: NEGATIVE
Glucose, UA: NEGATIVE
Hyaline Cast: NONE SEEN /LPF
Ketones, ur: NEGATIVE
Nitrites, Initial: NEGATIVE
Protein, ur: NEGATIVE
Specific Gravity, Urine: 1.018 (ref 1.001–1.035)
pH: 5.5 (ref 5.0–8.0)

## 2022-04-11 LAB — LIPID PANEL
Cholesterol: 193 mg/dL (ref <200)
HDL: 49 mg/dL — ABNORMAL LOW (ref >=50)
LDL Cholesterol (Calc): 116 mg/dL (calc) — ABNORMAL HIGH
Non-HDL Cholesterol (Calc): 144 mg/dL (calc) — ABNORMAL HIGH (ref <130)
Total CHOL/HDL Ratio: 3.9 (calc) (ref <5.0)
Triglycerides: 167 mg/dL — ABNORMAL HIGH (ref <150)

## 2022-04-11 LAB — CBC
HCT: 41.7 % (ref 35.0–45.0)
Hemoglobin: 14.3 g/dL (ref 11.7–15.5)
MCH: 28.3 pg (ref 27.0–33.0)
MCHC: 34.3 g/dL (ref 32.0–36.0)
MCV: 82.6 fL (ref 80.0–100.0)
MPV: 10.4 fL (ref 7.5–12.5)
Platelets: 249 10*3/uL (ref 140–400)
RBC: 5.05 10*6/uL (ref 3.80–5.10)
RDW: 13 % (ref 11.0–15.0)
WBC: 6.8 10*3/uL (ref 3.8–10.8)

## 2022-04-11 LAB — CELIAC PNL 2 RFLX ENDOMYSIAL AB TTR
(tTG) Ab, IgA: <1 U/mL
(tTG) Ab, IgG: <1 U/mL
Deamidated Gliadin Abs, IgG: <1 U/mL
Endomysial Ab IgA: NEGATIVE
Gliadin IgA: 11.7 U/mL
Immunoglobulin A: 415 mg/dL — ABNORMAL HIGH (ref 70–320)

## 2022-04-11 LAB — HEMOGLOBIN A1C
Hgb A1c MFr Bld: 7.5 % of total Hgb — ABNORMAL HIGH (ref <5.7)
Mean Plasma Glucose: 169 mg/dL
eAG (mmol/L): 9.3 mmol/L

## 2022-04-11 LAB — CULTURE INDICATED

## 2022-04-11 LAB — URINE CULTURE
MICRO NUMBER:: 14301429
Result:: NO GROWTH
SPECIMEN QUALITY:: ADEQUATE

## 2022-04-11 LAB — EXTRA SPECIMEN

## 2022-04-11 LAB — COMPLETE METABOLIC PANEL WITH GFR
AG Ratio: 1.3 (calc) (ref 1.0–2.5)
ALT: 12 U/L (ref 6–29)
AST: 18 U/L (ref 10–35)
Albumin: 4.1 g/dL (ref 3.6–5.1)
Alkaline phosphatase (APISO): 85 U/L (ref 37–153)
BUN: 13 mg/dL (ref 7–25)
CO2: 26 mmol/L (ref 20–32)
Calcium: 9.5 mg/dL (ref 8.6–10.4)
Chloride: 106 mmol/L (ref 98–110)
Creat: 0.82 mg/dL (ref 0.60–1.00)
Globulin: 3.2 g/dL (calc) (ref 1.9–3.7)
Glucose, Bld: 112 mg/dL — ABNORMAL HIGH (ref 65–99)
Potassium: 4 mmol/L (ref 3.5–5.3)
Sodium: 141 mmol/L (ref 135–146)
Total Bilirubin: 0.8 mg/dL (ref 0.2–1.2)
Total Protein: 7.3 g/dL (ref 6.1–8.1)
eGFR: 76 mL/min/{1.73_m2} (ref >=60)

## 2022-04-11 LAB — TSH: TSH: 2.35 mIU/L (ref 0.40–4.50)

## 2022-04-11 LAB — SEDIMENTATION RATE: Sed Rate: 36 mm/h — ABNORMAL HIGH (ref 0–30)

## 2022-04-11 LAB — VITAMIN B12: Vitamin B-12: 481 pg/mL (ref 200–1100)

## 2022-04-11 LAB — LIPASE: Lipase: 15 U/L (ref 7–60)

## 2022-04-16 ENCOUNTER — Ambulatory Visit (INDEPENDENT_AMBULATORY_CARE_PROVIDER_SITE_OTHER): Payer: Medicare Other | Admitting: Physician Assistant

## 2022-04-16 VITALS — BP 148/78 | Temp 97.9°F

## 2022-04-16 DIAGNOSIS — Z23 Encounter for immunization: Secondary | ICD-10-CM

## 2022-04-16 NOTE — Progress Notes (Signed)
Patient here today for PREVNAR 20 vaccine.  Location: Left Deltoid  She wanted to have her BP re-checked  In office was today: 148/78  She brought her BP readings:  12/14 @ 2:40 PM 152/96 12/16 @ 11:50 AM 153/89 12/20 @ 10:15 AM 150/89 12/20 @ 11:30 AM 145/78 12/26 @ 11 AM 138/77  She also brought a stool sample but in the last office notes it states to re-do a UA.  The patient also had questions if Dr. Dianah Field does Carpal Tunnel injections and was advised to make an appointment with Dr. Dianah Field for further evaluation.

## 2022-04-17 ENCOUNTER — Encounter: Payer: Self-pay | Admitting: Sports Medicine

## 2022-04-18 LAB — CELIAC PNL 2 RFLX ENDOMYSIAL AB TTR

## 2022-04-18 LAB — LIPID PANEL

## 2022-04-18 LAB — CBC

## 2022-04-18 LAB — FECAL LACTOFERRIN, QUANT
Fecal Lactoferrin: NEGATIVE
MICRO NUMBER:: 14365027
SPECIMEN QUALITY:: ADEQUATE

## 2022-04-18 LAB — SEDIMENTATION RATE

## 2022-04-18 LAB — TIQ-NTM

## 2022-04-18 LAB — VITAMIN B12

## 2022-04-18 LAB — URINALYSIS W MICROSCOPIC + REFLEX CULTURE

## 2022-04-18 LAB — COMPLETE METABOLIC PANEL WITH GFR

## 2022-04-18 LAB — HEMOGLOBIN A1C

## 2022-04-18 LAB — TSH

## 2022-04-24 ENCOUNTER — Other Ambulatory Visit: Payer: Self-pay | Admitting: Sports Medicine

## 2022-04-24 MED ORDER — APIXABAN 5 MG PO TABS: 5.0000 mg | ORAL_TABLET | Freq: Two times a day (BID) | ORAL | 0 refills | Status: DC

## 2022-04-28 ENCOUNTER — Ambulatory Visit (INDEPENDENT_AMBULATORY_CARE_PROVIDER_SITE_OTHER): Payer: Medicare Other

## 2022-04-28 ENCOUNTER — Ambulatory Visit (INDEPENDENT_AMBULATORY_CARE_PROVIDER_SITE_OTHER): Payer: Medicare Other | Admitting: Sports Medicine

## 2022-04-28 DIAGNOSIS — M7541 Impingement syndrome of right shoulder: Secondary | ICD-10-CM

## 2022-04-28 DIAGNOSIS — M25511 Pain in right shoulder: Secondary | ICD-10-CM

## 2022-04-28 DIAGNOSIS — G8929 Other chronic pain: Secondary | ICD-10-CM | POA: Diagnosis not present

## 2022-04-28 MED ORDER — TRIAMCINOLONE ACETONIDE 40 MG/ML IJ SUSP
40.0000 mg | Freq: Once | INTRAMUSCULAR | Status: AC
  Administered 2022-04-28: 40 mg via INTRAMUSCULAR

## 2022-04-28 NOTE — Addendum Note (Signed)
Addended by: Tarri Glenn A on: 04/28/2022 02:36 PM   Modules accepted: Orders

## 2022-04-28 NOTE — Progress Notes (Signed)
    Procedures performed today:    Procedure: Real-time Ultrasound Guided injection of the right acromioclavicular joint Device: Samsung HS60  Verbal informed consent obtained.  Time-out conducted.  Noted no overlying erythema, induration, or other signs of local infection.  Skin prepped in a sterile fashion.  Local anesthesia: Topical Ethyl chloride.  With sterile technique and under real time ultrasound guidance: Noted profoundly arthritic joint, 1 cc kenalog 40, 1 cc lidocaine, 1 cc bupivacaine injected easily. Completed without difficulty  Advised to call if fevers/chills, erythema, induration, drainage, or persistent bleeding.  Images permanently stored and available for review in PACS.  Impression: Technically successful ultrasound guided injection.  Independent interpretation of notes and tests performed by another provider:   None.  Brief History, Exam, Impression, and Recommendations:    Impingement syndrome, shoulder, right Several weeks of increasing pain right shoulder localized over the deltoid, weakness to abduction. Impingement signs on exam, subacromial injection today, formal PT, x-rays, return to see me in 6 weeks.    ____________________________________________ Gwen Her. Dianah Field, M.D., ABFM., CAQSM., AME. Primary Care and Sports Medicine [facility]  Adjunct Professor of [place] Medicine  Risk manager

## 2022-04-28 NOTE — Assessment & Plan Note (Addendum)
Several weeks of increasing pain right shoulder localized over the [facility] joint, weakness to abduction. Impingement signs on exam, weakness to abduction, positive crossarm sign. She did have significant sonographic Murphy sign over the acromioclavicular joint, it did appear profoundly arthritic so this was injected today with ultrasound guidance, she had some relief, we will add x-rays, formal PT, return to see me in 6 weeks, we will consider subacromial injection if not better.

## 2022-04-29 ENCOUNTER — Encounter: Payer: Self-pay | Admitting: Sports Medicine

## 2022-05-07 NOTE — Therapy (Incomplete)
OUTPATIENT PHYSICAL THERAPY SHOULDER EVALUATION   Patient Name: [name] MRN: [id number] DOB:April 17, 1951, 72 y.o., female Today's Date: 05/07/2022  END OF SESSION:   Past Medical History:  Diagnosis Date   Abnormal thyroid function test    Allergy    Anemia    she attributes previously to fibroids, she declines   Barrett's esophagus    Blood transfusion without reported diagnosis    DDD (degenerative disc disease), cervical    DJD (degenerative joint disease)    Dysrhythmia    GERD (gastroesophageal reflux disease)    Heart murmur    History of bronchitis    History of hiatal hernia    Hx of adenomatous polyp of colon 03/01/2002   Myocardial infarction (HCC)    OA (osteoarthritis)    Obese    Paroxysmal atrial fibrillation (HCC)    PE (pulmonary embolism) 12/21/2015   Plantar fasciitis    Pneumonia    PONV (postoperative nausea and vomiting)    pt. reports that she woke up during surgery   Pre-diabetes    Pulmonary embolism [facility])    Past Surgical History:  Procedure Laterality Date   ABDOMINAL HYSTERECTOMY     Arthroscopic surgery left knee     BACK SURGERY     2005 ,  2017   BIOPSY  07/26/2019   Procedure: BIOPSY;  Surgeon: Iva Boop, MD;  [facility];  Service: Endoscopy;;   CESAREAN SECTION     CHOLECYSTECTOMY     COLONOSCOPY  2003, 2011   3 mm adenoma 2011   COLONOSCOPY WITH PROPOFOL N/A 07/26/2019   Procedure: COLONOSCOPY WITH PROPOFOL;  Surgeon: Iva Boop, MD;  [facility];  Service: Endoscopy;  Laterality: N/A;   ESOPHAGOGASTRODUODENOSCOPY  multiple since 2003   ESOPHAGOGASTRODUODENOSCOPY (EGD) WITH PROPOFOL N/A 07/26/2019   Procedure: ESOPHAGOGASTRODUODENOSCOPY (EGD) WITH PROPOFOL;  Surgeon: Iva Boop, MD;  [facility];  Service: Endoscopy;  Laterality: N/A;   IR GENERIC HISTORICAL  12/19/2015   IR ANGIOGRAM SELECTIVE EACH ADDITIONAL VESSEL 12/19/2015 [name], MD [facility]   IR GENERIC  HISTORICAL  12/19/2015   IR ANGIOGRAM PULMONARY BILATERAL SELECTIVE 12/19/2015 [name], MD [facility]   IR GENERIC HISTORICAL  12/19/2015   IR INFUSION THROMBOL ARTERIAL INITIAL (MS) 12/19/2015 [name], MD [facility]   IR GENERIC HISTORICAL  12/19/2015   IR US GUIDE VASC ACCESS RIGHT 12/19/2015 [name], MD [facility]   IR GENERIC HISTORICAL  12/19/2015   IR [facility] SELECTIVE EACH ADDITIONAL VESSEL 12/19/2015 [name], MD [facility]   IR GENERIC HISTORICAL  12/20/2015   IR THROMB F/U EVAL ART/VEN FINAL DAY (MS) 12/20/2015 Gilmer Mor, DO [facility]   IR GENERIC HISTORICAL  12/19/2015   IR INFUSION THROMBOL ARTERIAL INITIAL (MS) 12/19/2015 [name], MD [facility]   IR RADIOLOGIST EVAL & MGMT  09/19/2020   PLANTAR FASCIA RELEASE     right   POLYPECTOMY  07/26/2019   Procedure: POLYPECTOMY;  Surgeon: Iva Boop, MD;  [facility];  Service: Endoscopy;;   Right knee replacement     SPINAL FUSION  10/31/2015   revision at [facility]    TONSILLECTOMY     TOTAL HIP ARTHROPLASTY Left 02/17/2020   Procedure: LEFT TOTAL HIP ARTHROPLASTY ANTERIOR APPROACH;  Surgeon: Kathryne Hitch, MD;  [facility];  Service: Orthopedics;  Laterality: Left;   TOTAL KNEE ARTHROPLASTY Left 10/13/2017   Procedure: LEFT TOTAL KNEE ARTHROPLASTY;  Surgeon: Magnus Ivan,  Vanita Panda, MD;  Location: [facility] OR;  Service: Orthopedics;  Laterality: Left;   ULNAR TUNNEL RELEASE     right   VENOUS ABLATION     x 2   Patient Active Problem List   Diagnosis Date Noted   Chronic right shoulder pain 04/28/2022   Urinary frequency 03/31/2022   Benign essential hypertension 03/31/2022   Abdominal pain 08/20/2021   Arthritis of carpometacarpal [facility]) joint of right thumb 04/25/2021   Sinusitis 01/14/2021   Annual physical exam 11/13/2020   Panniculitis 11/13/2020   Macromastia 11/13/2020   Right ovarian cyst 07/27/2020   Status post total replacement of left  hip 02/17/2020   Unilateral primary osteoarthritis, left hip 02/16/2020   Diarrhea    B12 deficiency 01/08/2018   History of pulmonary embolism 01/08/2018   Status post total left knee replacement 10/13/2017   Family history of thyroid disease 08/25/2017   Chronic pain of left knee 05/20/2017   Unilateral primary osteoarthritis, left knee 05/20/2017   Morbid obesity (HCC) 04/30/2017   Family history of ovarian cancer 02/17/2017   IBS (irritable bowel syndrome) 01/21/2017   Dyspnea 10/08/2016   Paroxysmal atrial fibrillation (HCC) 06/18/2016   Type 2 diabetes mellitus without complication, without long-term current use of insulin (HCC) 01/21/2016   H/O Spinal surgery 12/21/2015   Barrett's esophagus 12/21/2015   Hx of adenomatous polyp of colon 03/01/2002    PCP: Monica Becton, MD  REFERRING PROVIDER: Monica Becton, MD  REFERRING DIAG: Impingement syndrome, shoulder, right  THERAPY DIAG:  No diagnosis found.  Rationale for Evaluation and Treatment: Rehabilitation  ONSET DATE: ***   SUBJECTIVE:             SUBJECTIVE STATEMENT: ***  PERTINENT HISTORY: ***  PAIN:  Are you having pain? Yes:  NPRS scale: ***/10 Pain location: Right shoulder Pain description: *** Aggravating factors: *** Relieving factors: ***  PRECAUTIONS: None  WEIGHT BEARING RESTRICTIONS: No  FALLS:  Has patient fallen in last 6 months? No  OCCUPATION: ***  PLOF: Independent  PATIENT GOALS: Pain relief and ***   OBJECTIVE:  DIAGNOSTIC FINDINGS:  X-ray right shoulder 04/28/2022: IMPRESSION: 1. No fracture or acute finding. 2. Mild to moderate AC joint osteoarthritis.  PATIENT SURVEYS:  FOTO ***  COGNITION: Overall cognitive status: Within functional limits for tasks assessed     SENSATION: WFL  POSTURE: ***  UPPER EXTREMITY ROM:   {AROM/PROM:27142} ROM Right eval Left eval  Shoulder flexion    Shoulder extension    Shoulder abduction    Shoulder  adduction    Shoulder internal rotation    Shoulder external rotation    Elbow flexion    Elbow extension    Wrist flexion    Wrist extension    Wrist ulnar deviation    Wrist radial deviation    Wrist pronation    Wrist supination    (Blank rows = not tested)  UPPER EXTREMITY MMT:  MMT Right eval Left eval  Shoulder flexion    Shoulder extension    Shoulder abduction    Shoulder adduction    Shoulder internal rotation    Shoulder external rotation    Middle trapezius    Lower trapezius    Elbow flexion    Elbow extension    Wrist flexion    Wrist extension    Wrist ulnar deviation    Wrist radial deviation    Wrist pronation    Wrist supination    Grip strength (lbs)    (  Blank rows = not tested)  SHOULDER SPECIAL TESTS: Impingement tests: {shoulder impingement test:25231:a} SLAP lesions: {SLAP lesions:25232} Instability tests: {shoulder instability test:25233} Rotator cuff assessment: {rotator cuff assessment:25234} Biceps assessment: {biceps assessment:25235}  JOINT MOBILITY TESTING:  ***  PALPATION:  ***    TODAY'S TREATMENT:          [facility] Adult PT Treatment:                                                DATE: 05/09/2022 Therapeutic Exercise: ***  PATIENT EDUCATION: Education details: Exam findings, POC, HEP Person educated: Patient Education method: Explanation, Demonstration, Tactile cues, Verbal cues, and Handouts Education comprehension: verbalized understanding, returned demonstration, verbal cues required, tactile cues required, and needs further education  HOME EXERCISE PROGRAM: ***   ASSESSMENT: CLINICAL IMPRESSION: Patient is a 72 y.o. female who was seen today for physical therapy evaluation and treatment for ***.   OBJECTIVE IMPAIRMENTS: {opptimpairments:25111}.   ACTIVITY LIMITATIONS: {activitylimitations:27494}  PARTICIPATION LIMITATIONS: {participationrestrictions:25113}  PERSONAL FACTORS: {Personal factors:25162} are also  affecting patient's functional outcome.   REHAB POTENTIAL: {rehabpotential:25112}  CLINICAL DECISION MAKING: {clinical decision making:25114}  EVALUATION COMPLEXITY: {Evaluation complexity:25115}   GOALS: Goals reviewed with patient? {yes/no:20286}  SHORT TERM GOALS: Target date: ***  Patient will be I with initial HEP in order to progress with therapy. Baseline: HEP provided at eval Goal status: INITIAL  2.  PT will review FOTO with patient by 3rd visit in order to understand expected progress and outcome with therapy. Baseline: FOTO assessed at eval Goal status: INITIAL  3.  *** Baseline:  Goal status: INITIAL  LONG TERM GOALS: Target date: ***  Patient will be I with final HEP to maintain progress from PT. Baseline: HEP provided at eval Goal status: INITIAL  2.  Patient will report >/= ***% status on FOTO to indicate improved functional ability. Baseline:  Goal status: INITIAL  3.  *** Baseline:  Goal status: INITIAL  4.  *** Baseline:  Goal status: INITIAL   PLAN: PT FREQUENCY: {rehab frequency:25116}  PT DURATION: {rehab duration:25117}  PLANNED INTERVENTIONS: {rehab planned interventions:25118::"Therapeutic exercises","Therapeutic activity","Neuromuscular re-education","Balance training","Gait training","Patient/Family education","Self Care","Joint mobilization"}  PLAN FOR NEXT SESSION: Review HEP and progress PRN, ***   Rosana Hoes, PT, DPT, LAT, ATC 05/07/22  4:12 PM Phone: [phone] Fax: [phone]

## 2022-05-08 NOTE — Therapy (Signed)
OUTPATIENT PHYSICAL THERAPY EVALUATION   Patient Name: [name] MRN: [id number] DOB:[birth year], 72 y.o., female Today's Date: 05/14/2022  END OF SESSION:  PT End of Session - 05/14/22 1310     Visit Number 1    Number of Visits 17    Date for PT Re-Evaluation 07/09/22    Authorization Type [employer] MCR    Progress Note Due on Visit 10    PT Start Time 1315    PT Stop Time 1400    PT Time Calculation (min) 45 min    Activity Tolerance Patient limited by pain    Behavior During Therapy WFL for tasks assessed/performed             Past Medical History:  Diagnosis Date   Abnormal thyroid function test    Allergy    Anemia    she attributes previously to fibroids, she declines   Barrett's esophagus    Blood transfusion without reported diagnosis    DDD (degenerative disc disease), cervical    DJD (degenerative joint disease)    Dysrhythmia    GERD (gastroesophageal reflux disease)    Heart murmur    History of bronchitis    History of hiatal hernia    Hx of adenomatous polyp of colon 03/01/2002   Myocardial infarction (HCC)    OA (osteoarthritis)    Obese    Paroxysmal atrial fibrillation (HCC)    PE (pulmonary embolism) 12/21/2015   Plantar fasciitis    Pneumonia    PONV (postoperative nausea and vomiting)    pt. reports that she woke up during surgery   Pre-diabetes    Pulmonary embolism [facility])    Past Surgical History:  Procedure Laterality Date   ABDOMINAL HYSTERECTOMY     Arthroscopic surgery left knee     BACK SURGERY     2005 ,  2017   BIOPSY  07/26/2019   Procedure: BIOPSY;  Surgeon: Iva Boop, MD;  [facility];  Service: Endoscopy;;   CESAREAN SECTION     CHOLECYSTECTOMY     COLONOSCOPY  2003, 2011   3 mm adenoma 2011   COLONOSCOPY WITH PROPOFOL N/A 07/26/2019   Procedure: COLONOSCOPY WITH PROPOFOL;  Surgeon: Iva Boop, MD;  [facility];  Service: Endoscopy;  Laterality: N/A;   ESOPHAGOGASTRODUODENOSCOPY   multiple since 2003   ESOPHAGOGASTRODUODENOSCOPY (EGD) WITH PROPOFOL N/A 07/26/2019   Procedure: ESOPHAGOGASTRODUODENOSCOPY (EGD) WITH PROPOFOL;  Surgeon: Iva Boop, MD;  [facility];  Service: Endoscopy;  Laterality: N/A;   IR GENERIC HISTORICAL  12/19/2015   IR ANGIOGRAM SELECTIVE EACH ADDITIONAL VESSEL 12/19/2015 [name], MD [facility]   IR GENERIC HISTORICAL  12/19/2015   IR ANGIOGRAM PULMONARY BILATERAL SELECTIVE 12/19/2015 [name], MD [facility]   IR GENERIC HISTORICAL  12/19/2015   IR INFUSION THROMBOL ARTERIAL INITIAL (MS) 12/19/2015 [name], MD [facility]   IR GENERIC HISTORICAL  12/19/2015   IR US GUIDE VASC ACCESS RIGHT 12/19/2015 [name], MD [facility]   IR GENERIC HISTORICAL  12/19/2015   IR [facility] SELECTIVE EACH ADDITIONAL VESSEL 12/19/2015 [name], MD [facility]   IR GENERIC HISTORICAL  12/20/2015   IR THROMB F/U EVAL ART/VEN FINAL DAY (MS) 12/20/2015 Gilmer Mor, DO [facility]   IR GENERIC HISTORICAL  12/19/2015   IR INFUSION THROMBOL ARTERIAL INITIAL (MS) 12/19/2015 [name], MD [facility]   IR RADIOLOGIST EVAL & MGMT  09/19/2020   PLANTAR FASCIA RELEASE     right  POLYPECTOMY  07/26/2019   Procedure: POLYPECTOMY;  Surgeon: Iva Boop, MD;  [facility];  Service: Endoscopy;;   Right knee replacement     SPINAL FUSION  10/31/2015   revision at [facility]    TONSILLECTOMY     TOTAL HIP ARTHROPLASTY Left 02/17/2020   Procedure: LEFT TOTAL HIP ARTHROPLASTY ANTERIOR APPROACH;  Surgeon: Kathryne Hitch, MD;  [facility];  Service: Orthopedics;  Laterality: Left;   TOTAL KNEE ARTHROPLASTY Left 10/13/2017   Procedure: LEFT TOTAL KNEE ARTHROPLASTY;  Surgeon: Kathryne Hitch, MD;  [facility];  Service: Orthopedics;  Laterality: Left;   ULNAR TUNNEL RELEASE     right   VENOUS ABLATION     x 2   Patient Active Problem List   Diagnosis Date Noted   Chronic right  shoulder pain 04/28/2022   Urinary frequency 03/31/2022   Benign essential hypertension 03/31/2022   Abdominal pain 08/20/2021   Arthritis of carpometacarpal ([facility]) joint of right thumb 04/25/2021   Sinusitis 01/14/2021   Annual physical exam 11/13/2020   Panniculitis 11/13/2020   Macromastia 11/13/2020   Right ovarian cyst 07/27/2020   Status post total replacement of left hip 02/17/2020   Unilateral primary osteoarthritis, left hip 02/16/2020   Diarrhea    B12 deficiency 01/08/2018   History of pulmonary embolism 01/08/2018   Status post total left knee replacement 10/13/2017   Family history of thyroid disease 08/25/2017   Chronic pain of left knee 05/20/2017   Unilateral primary osteoarthritis, left knee 05/20/2017   Morbid obesity (HCC) 04/30/2017   Family history of ovarian cancer 02/17/2017   IBS (irritable bowel syndrome) 01/21/2017   Dyspnea 10/08/2016   Paroxysmal atrial fibrillation (HCC) 06/18/2016   Type 2 diabetes mellitus without complication, without long-term current use of insulin (HCC) 01/21/2016   H/O Spinal surgery 12/21/2015   Barrett's esophagus 12/21/2015   Hx of adenomatous polyp of colon 03/01/2002    PCP: Monica Becton, MD  REFERRING PROVIDER: Monica Becton, MD  REFERRING DIAG: Impingement syndrome, shoulder, right  THERAPY DIAG:  Chronic right shoulder pain  Muscle weakness (generalized)  Rationale for Evaluation and Treatment: Rehabilitation  ONSET DATE: ongoing for 1-2 months   SUBJECTIVE:             SUBJECTIVE STATEMENT: Patient reports right shoulder and upper arm pain, swelling, and knots up and down the arm. The shoulder pain has been going on for about a month without an apparent mechanism. She also reports that they want to do surgery on her right thumb and wrist  but is putting this off. She did have a shot in the right shoulder without much help. She also can't rotate her right shoulder out due to her elbow  hurting. She does not with the cold weather her symptoms did get worse. She has trouble sleeping because she typically sleeps on the right side but cannot because of pain.  Patient is right handed. She does take care of her husband and mother and the shoulder pain and limitation has made this more challenging.  PERTINENT HISTORY: See PMH above  PAIN:  Are you having pain? Yes:  NPRS scale: 3-4/10 (8/10 when raising shoulder) Pain location: Right shoulder Pain description: Stabbing Aggravating factors: Moving the right shoulder Relieving factors: Rest, heat while applied,   PRECAUTIONS: None  WEIGHT BEARING RESTRICTIONS: No  FALLS:  Has patient fallen in last 6 months? No  PLOF: Independent  PATIENT GOALS: Pain relief and  be able to use the right arm   OBJECTIVE:  DIAGNOSTIC FINDINGS:  X-ray right shoulder 04/28/2022: IMPRESSION: 1. No fracture or acute finding. 2. Mild to moderate AC joint osteoarthritis.  PATIENT SURVEYS:  FOTO 52% functional status  COGNITION: Overall cognitive status: Within functional limits for tasks assessed     SENSATION: WFL  POSTURE: Rounded shoulders, increased thoracic kyphosis, forward head,   UPPER EXTREMITY ROM:   Passive ROM Right eval Left eval  Shoulder flexion 90   Shoulder extension    Shoulder abduction 60   Shoulder adduction    Shoulder internal rotation Unable to reach behind back   Shoulder external rotation 50   Elbow flexion WNL   Elbow extension WNL   Wrist flexion    Wrist extension    Wrist ulnar deviation    Wrist radial deviation    Wrist pronation    Wrist supination    (Blank rows = not tested)  PROM left UE grossly limited equal to AROM due to pain and guarding, elbow PROM grossly WFL but pain at end ranges  UPPER EXTREMITY MMT:    Strength not assessed due to pain  MMT Right eval Left eval  Shoulder flexion    Shoulder extension    Shoulder abduction    Shoulder adduction    Shoulder  internal rotation    Shoulder external rotation    Middle trapezius    Lower trapezius    Elbow flexion    Elbow extension    Wrist flexion    Wrist extension    Wrist ulnar deviation    Wrist radial deviation    Wrist pronation    Wrist supination    Grip strength (lbs)    (Blank rows = not tested)  SHOULDER SPECIAL TESTS: Not able to assess due to painb  JOINT MOBILITY TESTING:  Not assessed due to pain  PALPATION:  Tender to light muscular palpation globally around right shoulder and upper arm with exquisite tenderness to lateral aspect of upper arm and AC joint region    TODAY'S TREATMENT:          [facility] Adult PT Treatment:                                                DATE: 05/09/2022 Therapeutic Exercise: Sidelying shoulder ER x 10 Seated table slide shoulder flexion x 5  PATIENT EDUCATION: Education details: Exam findings, POC, HEP Person educated: Patient Education method: Explanation, Demonstration, Tactile cues, Verbal cues, and Handouts Education comprehension: verbalized understanding, returned demonstration, verbal cues required, tactile cues required, and needs further education  HOME EXERCISE PROGRAM: Access Code: [email or web address]    ASSESSMENT: CLINICAL IMPRESSION: Patient is a 72 y.o. female who was seen today for physical therapy evaluation and treatment for chronic right shoulder and arm pain. Evaluation limited due to patient's high level of pain and irritability of symptoms with movement. Unclear exact etiology of symptoms but does seem to have a significant rotator cuff related pain component resulting in limitation in shoulder active motion and strength.    OBJECTIVE IMPAIRMENTS: decreased activity tolerance, decreased ROM, decreased strength, impaired UE functional use, postural dysfunction, and pain.   ACTIVITY LIMITATIONS: carrying, lifting, sleeping, bathing, dressing, reach over head, hygiene/grooming, and caring for others  PARTICIPATION  LIMITATIONS: meal prep, cleaning, laundry, and shopping  PERSONAL FACTORS: Fitness, Past/current experiences,  Time since onset of injury/illness/exacerbation, and 3+ comorbidities: see PMH above  are also affecting patient's functional outcome.   REHAB POTENTIAL: Good  CLINICAL DECISION MAKING: Stable/uncomplicated  EVALUATION COMPLEXITY: Low   GOALS: Goals reviewed with patient? Yes  SHORT TERM GOALS: Target date: 06/11/2022  Patient will be I with initial HEP in order to progress with therapy. Baseline: HEP provided at eval Goal status: INITIAL  2.  PT will review FOTO with patient by 3rd visit in order to understand expected progress and outcome with therapy. Baseline: FOTO assessed at eval Goal status: INITIAL  3.  Patient will report right shoulder pain </= 5/10 with active motion to reduce functional limitations Baseline: 8/10 pain Goal status: INITIAL  LONG TERM GOALS: Target date: 07/09/2022  Patient will be I with final HEP to maintain progress from PT. Baseline: HEP provided at eval Goal status: INITIAL  2.  Patient will report >/= 64% status on FOTO to indicate improved functional ability. Baseline: 52% Goal status: INITIAL  3.  Patient will demonstrate right shoulder flexion AROM >/= 120 deg in order to improve reach into higher cabinet or improve dressing/grooming tasks without limitation Baseline: 90 deg Goal status: INITIAL  4.  Patient will be able to reach behind back to L5 in order to improve dressing and bathing ability Baseline: unable to reach behind her back at eval Goal status: INITIAL  5. Patient will demonstrate right shoulder strength grossly >/= 4/5 MMT in order to improve her ability to take care of her husband and mother without increased pain or limitation  Baseline: not assessed at eval due to pain  Goal status: INITIAL   PLAN: PT FREQUENCY: 1-2x/week  PT DURATION: 8 weeks  PLANNED INTERVENTIONS: Therapeutic exercises, Therapeutic  activity, Neuromuscular re-education, Balance training, Gait training, Patient/Family education, Self Care, Joint mobilization, Joint manipulation, Aquatic Therapy, Dry Needling, Electrical stimulation, Cryotherapy, Moist heat, Taping, Ionotophoresis 4mg /ml Dexamethasone, Manual therapy, and Re-evaluation  PLAN FOR NEXT SESSION: Review HEP and progress PRN, manual/modalities for pain relief of right shoulder and upper arm, PROM right shoulder progressing AAROM and AROM as tolerated, trial shoulder isometrics as able   Rosana Hoes, PT, DPT, LAT, ATC 05/14/22  3:48 PM Phone: [phone] Fax: [phone]

## 2022-05-09 ENCOUNTER — Ambulatory Visit: Payer: Medicare Other | Admitting: Physical Therapy

## 2022-05-14 ENCOUNTER — Ambulatory Visit: Payer: Medicare Other | Attending: Sports Medicine | Admitting: Physical Therapy

## 2022-05-14 ENCOUNTER — Encounter: Payer: Self-pay | Admitting: Physical Therapy

## 2022-05-14 ENCOUNTER — Other Ambulatory Visit: Payer: Self-pay

## 2022-05-14 DIAGNOSIS — M25511 Pain in right shoulder: Secondary | ICD-10-CM | POA: Insufficient documentation

## 2022-05-14 DIAGNOSIS — G8929 Other chronic pain: Secondary | ICD-10-CM | POA: Diagnosis present

## 2022-05-14 DIAGNOSIS — M7541 Impingement syndrome of right shoulder: Secondary | ICD-10-CM | POA: Insufficient documentation

## 2022-05-14 DIAGNOSIS — M6281 Muscle weakness (generalized): Secondary | ICD-10-CM

## 2022-05-14 NOTE — Patient Instructions (Signed)
Access Code: [email or web address] URL: [email or web address] Date: 05/14/2022 Prepared by: Hilda Blades  Exercises - Sidelying Shoulder External Rotation AROM  - 1-2 x daily - 20 reps - Seated Shoulder Flexion Towel Slide at Table Top  - 1-2 x daily - 10 reps -  10 seconds hold

## 2022-05-19 ENCOUNTER — Other Ambulatory Visit: Payer: Self-pay

## 2022-05-19 ENCOUNTER — Encounter: Payer: Self-pay | Admitting: Physical Therapy

## 2022-05-19 ENCOUNTER — Ambulatory Visit: Payer: Medicare Other | Admitting: Physical Therapy

## 2022-05-19 DIAGNOSIS — G8929 Other chronic pain: Secondary | ICD-10-CM

## 2022-05-19 DIAGNOSIS — M25511 Pain in right shoulder: Secondary | ICD-10-CM | POA: Diagnosis not present

## 2022-05-19 DIAGNOSIS — M6281 Muscle weakness (generalized): Secondary | ICD-10-CM

## 2022-05-19 NOTE — Patient Instructions (Signed)
Access Code: [email or web address] URL: [email or web address] Date: 05/19/2022 Prepared by: Hilda Blades  Exercises - Sidelying Shoulder External Rotation AROM  - 1-2 x daily - 20 reps - Seated Shoulder Flexion Towel Slide at Table Top  - 1-2 x daily - 10 reps -  10 seconds hold - Seated Scapular Retraction  - 1-2 x daily - 10 reps - 5 seconds hold

## 2022-05-19 NOTE — Therapy (Addendum)
OUTPATIENT PHYSICAL THERAPY TREATMENT NOTE DISCHARGE   Patient Name: [name] MRN: [id number] DOB:07/14/1950, 72 y.o., female Today's Date: 05/19/2022  PCP: Monica Becton, MD REFERRING PROVIDER: Monica Becton, MD   END OF SESSION:   PT End of Session - 05/19/22 1409     Visit Number 2    Number of Visits 17    Date for PT Re-Evaluation 07/09/22    Authorization Type [employer] MCR    Progress Note Due on Visit 10    PT Start Time 1400    PT Stop Time 1440    PT Time Calculation (min) 40 min    Activity Tolerance Patient limited by pain    Behavior During Therapy WFL for tasks assessed/performed             Past Medical History:  Diagnosis Date   Abnormal thyroid function test    Allergy    Anemia    she attributes previously to fibroids, she declines   Barrett's esophagus    Blood transfusion without reported diagnosis    DDD (degenerative disc disease), cervical    DJD (degenerative joint disease)    Dysrhythmia    GERD (gastroesophageal reflux disease)    Heart murmur    History of bronchitis    History of hiatal hernia    Hx of adenomatous polyp of colon 03/01/2002   Myocardial infarction (HCC)    OA (osteoarthritis)    Obese    Paroxysmal atrial fibrillation (HCC)    PE (pulmonary embolism) 12/21/2015   Plantar fasciitis    Pneumonia    PONV (postoperative nausea and vomiting)    pt. reports that she woke up during surgery   Pre-diabetes    Pulmonary embolism [facility])    Past Surgical History:  Procedure Laterality Date   ABDOMINAL HYSTERECTOMY     Arthroscopic surgery left knee     BACK SURGERY     2005 ,  2017   BIOPSY  07/26/2019   Procedure: BIOPSY;  Surgeon: Iva Boop, MD;  [facility];  Service: Endoscopy;;   CESAREAN SECTION     CHOLECYSTECTOMY     COLONOSCOPY  2003, 2011   3 mm adenoma 2011   COLONOSCOPY WITH PROPOFOL N/A 07/26/2019   Procedure: COLONOSCOPY WITH PROPOFOL;  Surgeon: Iva Boop, MD;   [facility];  Service: Endoscopy;  Laterality: N/A;   ESOPHAGOGASTRODUODENOSCOPY  multiple since 2003   ESOPHAGOGASTRODUODENOSCOPY (EGD) WITH PROPOFOL N/A 07/26/2019   Procedure: ESOPHAGOGASTRODUODENOSCOPY (EGD) WITH PROPOFOL;  Surgeon: Iva Boop, MD;  [facility];  Service: Endoscopy;  Laterality: N/A;   IR GENERIC HISTORICAL  12/19/2015   IR ANGIOGRAM SELECTIVE EACH ADDITIONAL VESSEL 12/19/2015 [name], MD [facility]   IR GENERIC HISTORICAL  12/19/2015   IR ANGIOGRAM PULMONARY BILATERAL SELECTIVE 12/19/2015 [name], MD [facility]   IR GENERIC HISTORICAL  12/19/2015   IR INFUSION THROMBOL ARTERIAL INITIAL (MS) 12/19/2015 [name], MD [facility]   IR GENERIC HISTORICAL  12/19/2015   IR US GUIDE VASC ACCESS RIGHT 12/19/2015 [name], MD [facility]   IR GENERIC HISTORICAL  12/19/2015   IR [facility] SELECTIVE EACH ADDITIONAL VESSEL 12/19/2015 [name], MD [facility]   IR GENERIC HISTORICAL  12/20/2015   IR THROMB F/U EVAL ART/VEN FINAL DAY (MS) 12/20/2015 Gilmer Mor, DO [facility]   IR GENERIC HISTORICAL  12/19/2015   IR INFUSION THROMBOL ARTERIAL INITIAL (MS) 12/19/2015 [name], MD [facility]   IR  RADIOLOGIST EVAL & MGMT  09/19/2020   PLANTAR FASCIA RELEASE     right   POLYPECTOMY  07/26/2019   Procedure: POLYPECTOMY;  Surgeon: Iva Boop, MD;  [facility];  Service: Endoscopy;;   Right knee replacement     SPINAL FUSION  10/31/2015   revision at [facility]    TONSILLECTOMY     TOTAL HIP ARTHROPLASTY Left 02/17/2020   Procedure: LEFT TOTAL HIP ARTHROPLASTY ANTERIOR APPROACH;  Surgeon: Kathryne Hitch, MD;  [facility];  Service: Orthopedics;  Laterality: Left;   TOTAL KNEE ARTHROPLASTY Left 10/13/2017   Procedure: LEFT TOTAL KNEE ARTHROPLASTY;  Surgeon: Kathryne Hitch, MD;  [facility];  Service: Orthopedics;  Laterality: Left;   ULNAR TUNNEL RELEASE     right   VENOUS  ABLATION     x 2   Patient Active Problem List   Diagnosis Date Noted   Chronic right shoulder pain 04/28/2022   Urinary frequency 03/31/2022   Benign essential hypertension 03/31/2022   Abdominal pain 08/20/2021   Arthritis of carpometacarpal [facility]) joint of right thumb 04/25/2021   Sinusitis 01/14/2021   Annual physical exam 11/13/2020   Panniculitis 11/13/2020   Macromastia 11/13/2020   Right ovarian cyst 07/27/2020   Status post total replacement of left hip 02/17/2020   Unilateral primary osteoarthritis, left hip 02/16/2020   Diarrhea    B12 deficiency 01/08/2018   History of pulmonary embolism 01/08/2018   Status post total left knee replacement 10/13/2017   Family history of thyroid disease 08/25/2017   Chronic pain of left knee 05/20/2017   Unilateral primary osteoarthritis, left knee 05/20/2017   Morbid obesity (HCC) 04/30/2017   Family history of ovarian cancer 02/17/2017   IBS (irritable bowel syndrome) 01/21/2017   Dyspnea 10/08/2016   Paroxysmal atrial fibrillation (HCC) 06/18/2016   Type 2 diabetes mellitus without complication, without long-term current use of insulin (HCC) 01/21/2016   H/O Spinal surgery 12/21/2015   Barrett's esophagus 12/21/2015   Hx of adenomatous polyp of colon 03/01/2002    REFERRING DIAG: Impingement syndrome, shoulder, right   THERAPY DIAG:  Chronic right shoulder pain  Muscle weakness (generalized)  Rationale for Evaluation and Treatment Rehabilitation  PERTINENT HISTORY: See PMH above   PRECAUTIONS: None    SUBJECTIVE:                                                                                                                                                                                     SUBJECTIVE STATEMENT:  Patient reports she was very sore following last visit, and she is starting to have more left shoulder pain now.   PAIN:  Are you having pain? Yes:  NPRS scale: 3/10 (8/10 when raising shoulder) Pain  location: Right shoulder Pain description: Stabbing Aggravating factors: Moving the right shoulder Relieving factors: Rest, heat while applied,    OBJECTIVE: (objective measures completed at initial evaluation unless otherwise dated) PATIENT SURVEYS:  FOTO 52% functional status   POSTURE: Rounded shoulders, increased thoracic kyphosis, forward head    UPPER EXTREMITY ROM:    Passive ROM Right eval Left eval  Shoulder flexion 90    Shoulder extension      Shoulder abduction 60    Shoulder adduction      Shoulder internal rotation Unable to reach behind back    Shoulder external rotation 50    Elbow flexion WNL    Elbow extension WNL    Wrist flexion      Wrist extension      Wrist ulnar deviation      Wrist radial deviation      Wrist pronation      Wrist supination      (Blank rows = not tested)   PROM left UE grossly limited equal to AROM due to pain and guarding, elbow PROM grossly WFL but pain at end ranges   UPPER EXTREMITY MMT:                         Strength not assessed due to pain   MMT Right eval Left eval  Shoulder flexion      Shoulder extension      Shoulder abduction      Shoulder adduction      Shoulder internal rotation      Shoulder external rotation      Middle trapezius      Lower trapezius      Elbow flexion      Elbow extension      Wrist flexion      Wrist extension      Wrist ulnar deviation      Wrist radial deviation      Wrist pronation      Wrist supination      Grip strength (lbs)      (Blank rows = not tested)   SHOULDER SPECIAL TESTS: Not able to assess due to painb   JOINT MOBILITY TESTING:  Not assessed due to pain   PALPATION:  Tender to light muscular palpation globally around right shoulder and upper arm with exquisite tenderness to lateral aspect of upper arm and AC joint region               TODAY'S TREATMENT:          [facility] Adult PT Treatment:                                                DATE:  05/19/2022 Therapeutic Exercise: Seated overhead pulley x 4 min Seated shoulder flexion with physoball 2 x 10 Seated shoulder blade squeezes x 10 Standing shoulder isometrics at wall flexion, abduction, extension, ER, IR 5 x 5 sec each Manual: PROM right shoulder and elbow   [facility] Adult PT Treatment:  DATE: 05/14/2022 Therapeutic Exercise: Sidelying shoulder ER x 10 Seated table slide shoulder flexion x 5   PATIENT EDUCATION: Education details: HEP update Person educated: Patient Education method: Explanation, Demonstration, Tactile cues, Verbal cues, Handout Education comprehension: verbalized understanding, returned demonstration, verbal cues required, tactile cues required, and needs further education   HOME EXERCISE PROGRAM: Access Code: [email or web address]      ASSESSMENT: CLINICAL IMPRESSION: Patient tolerated therapy well with no adverse effects. Therapy focused on progressing right shoulder motion and strength as tolerated. She continues to exhibit limitations in her shoulder active and passive motions due to pain. Initiated shoulder isometrics and periscapular activation of moderate tolerance. She reports shoulder pain with all exercises. Updated HEP to postural control. Patient would benefit from continued skilled PT to progress her mobility and strength in order to reduce pain and maximize functional ability.     OBJECTIVE IMPAIRMENTS: decreased activity tolerance, decreased ROM, decreased strength, impaired UE functional use, postural dysfunction, and pain.    ACTIVITY LIMITATIONS: carrying, lifting, sleeping, bathing, dressing, reach over head, hygiene/grooming, and caring for others   PARTICIPATION LIMITATIONS: meal prep, cleaning, laundry, and shopping   PERSONAL FACTORS: Fitness, Past/current experiences, Time since onset of injury/illness/exacerbation, and 3+ comorbidities: see PMH above  are also affecting patient's functional  outcome.      GOALS: Goals reviewed with patient? Yes   SHORT TERM GOALS: Target date: 06/11/2022   Patient will be I with initial HEP in order to progress with therapy. Baseline: HEP provided at eval Goal status: INITIAL   2.  PT will review FOTO with patient by 3rd visit in order to understand expected progress and outcome with therapy. Baseline: FOTO assessed at eval Goal status: INITIAL   3.  Patient will report right shoulder pain </= 5/10 with active motion to reduce functional limitations Baseline: 8/10 pain Goal status: INITIAL   LONG TERM GOALS: Target date: 07/09/2022   Patient will be I with final HEP to maintain progress from PT. Baseline: HEP provided at eval Goal status: INITIAL   2.  Patient will report >/= 64% status on FOTO to indicate improved functional ability. Baseline: 52% Goal status: INITIAL   3.  Patient will demonstrate right shoulder flexion AROM >/= 120 deg in order to improve reach into higher cabinet or improve dressing/grooming tasks without limitation Baseline: 90 deg Goal status: INITIAL   4.  Patient will be able to reach behind back to L5 in order to improve dressing and bathing ability Baseline: unable to reach behind her back at eval Goal status: INITIAL   5. Patient will demonstrate right shoulder strength grossly >/= 4/5 MMT in order to improve her ability to take care of her husband and mother without increased pain or limitation            Baseline: not assessed at eval due to pain            Goal status: INITIAL     PLAN: PT FREQUENCY: 1-2x/week   PT DURATION: 8 weeks   PLANNED INTERVENTIONS: Therapeutic exercises, Therapeutic activity, Neuromuscular re-education, Balance training, Gait training, Patient/Family education, Self Care, Joint mobilization, Joint manipulation, Aquatic Therapy, Dry Needling, Electrical stimulation, Cryotherapy, Moist heat, Taping, Ionotophoresis 4mg /ml Dexamethasone, Manual therapy, and Re-evaluation    PLAN FOR NEXT SESSION: Review HEP and progress PRN, manual/modalities for pain relief of right shoulder and upper arm, PROM right shoulder progressing AAROM and AROM as tolerated, trial shoulder isometrics as able   Rosana Hoes, PT, DPT, LAT, ATC  05/19/22  3:28 PM Phone: [phone] Fax: [phone]     PHYSICAL THERAPY DISCHARGE SUMMARY  Visits from Start of Care: 2  Current functional level related to goals / functional outcomes: See above   Remaining deficits: See above   Education / Equipment: HEP   Patient agrees to discharge. Patient goals were not met. Patient is being discharged due to not returning since the last visit.  Rosana Hoes, PT, DPT, LAT, ATC 06/24/22  10:59 AM Phone: [phone] Fax: [phone]

## 2022-05-28 ENCOUNTER — Ambulatory Visit: Payer: Medicare Other | Admitting: Physical Therapy

## 2022-06-04 ENCOUNTER — Ambulatory Visit: Payer: Medicare Other | Admitting: Physical Therapy

## 2022-06-05 ENCOUNTER — Ambulatory Visit: Payer: Medicare Other | Admitting: Physical Therapy

## 2022-06-05 NOTE — Therapy (Incomplete)
OUTPATIENT PHYSICAL THERAPY TREATMENT NOTE   Patient Name: [name] MRN: [id number] DOB:02/22/1951, 72 y.o., female Today's Date: 06/05/2022  PCP: Monica Becton, MD REFERRING PROVIDER: Monica Becton, MD   END OF SESSION:     Past Medical History:  Diagnosis Date   Abnormal thyroid function test    Allergy    Anemia    she attributes previously to fibroids, she declines   Barrett's esophagus    Blood transfusion without reported diagnosis    DDD (degenerative disc disease), cervical    DJD (degenerative joint disease)    Dysrhythmia    GERD (gastroesophageal reflux disease)    Heart murmur    History of bronchitis    History of hiatal hernia    Hx of adenomatous polyp of colon 03/01/2002   Myocardial infarction (HCC)    OA (osteoarthritis)    Obese    Paroxysmal atrial fibrillation (HCC)    PE (pulmonary embolism) 12/21/2015   Plantar fasciitis    Pneumonia    PONV (postoperative nausea and vomiting)    pt. reports that she woke up during surgery   Pre-diabetes    Pulmonary embolism [facility])    Past Surgical History:  Procedure Laterality Date   ABDOMINAL HYSTERECTOMY     Arthroscopic surgery left knee     BACK SURGERY     2005 ,  2017   BIOPSY  07/26/2019   Procedure: BIOPSY;  Surgeon: Iva Boop, MD;  [facility];  Service: Endoscopy;;   CESAREAN SECTION     CHOLECYSTECTOMY     COLONOSCOPY  2003, 2011   3 mm adenoma 2011   COLONOSCOPY WITH PROPOFOL N/A 07/26/2019   Procedure: COLONOSCOPY WITH PROPOFOL;  Surgeon: Iva Boop, MD;  [facility];  Service: Endoscopy;  Laterality: N/A;   ESOPHAGOGASTRODUODENOSCOPY  multiple since 2003   ESOPHAGOGASTRODUODENOSCOPY (EGD) WITH PROPOFOL N/A 07/26/2019   Procedure: ESOPHAGOGASTRODUODENOSCOPY (EGD) WITH PROPOFOL;  Surgeon: Iva Boop, MD;  [facility];  Service: Endoscopy;  Laterality: N/A;   IR GENERIC HISTORICAL  12/19/2015   IR ANGIOGRAM SELECTIVE  EACH ADDITIONAL VESSEL 12/19/2015 [name], MD [facility]   IR GENERIC HISTORICAL  12/19/2015   IR ANGIOGRAM PULMONARY BILATERAL SELECTIVE 12/19/2015 [name], MD [facility]   IR GENERIC HISTORICAL  12/19/2015   IR INFUSION THROMBOL ARTERIAL INITIAL (MS) 12/19/2015 [name], MD [facility]   IR GENERIC HISTORICAL  12/19/2015   IR US GUIDE VASC ACCESS RIGHT 12/19/2015 [name], MD [facility]   IR GENERIC HISTORICAL  12/19/2015   IR [facility] SELECTIVE EACH ADDITIONAL VESSEL 12/19/2015 [name], MD [facility]   IR GENERIC HISTORICAL  12/20/2015   IR THROMB F/U EVAL ART/VEN FINAL DAY (MS) 12/20/2015 Gilmer Mor, DO [facility]   IR GENERIC HISTORICAL  12/19/2015   IR INFUSION THROMBOL ARTERIAL INITIAL (MS) 12/19/2015 [name], MD [facility]   IR RADIOLOGIST EVAL & MGMT  09/19/2020   PLANTAR FASCIA RELEASE     right   POLYPECTOMY  07/26/2019   Procedure: POLYPECTOMY;  Surgeon: Iva Boop, MD;  [facility];  Service: Endoscopy;;   Right knee replacement     SPINAL FUSION  10/31/2015   revision at [facility]    TONSILLECTOMY     TOTAL HIP ARTHROPLASTY Left 02/17/2020   Procedure: LEFT TOTAL HIP ARTHROPLASTY ANTERIOR APPROACH;  Surgeon: Kathryne Hitch, MD;  [facility];  Service: Orthopedics;  Laterality: Left;  TOTAL KNEE ARTHROPLASTY Left 10/13/2017   Procedure: LEFT TOTAL KNEE ARTHROPLASTY;  Surgeon: Kathryne Hitch, MD;  [facility];  Service: Orthopedics;  Laterality: Left;   ULNAR TUNNEL RELEASE     right   VENOUS ABLATION     x 2   Patient Active Problem List   Diagnosis Date Noted   Chronic right shoulder pain 04/28/2022   Urinary frequency 03/31/2022   Benign essential hypertension 03/31/2022   Abdominal pain 08/20/2021   Arthritis of carpometacarpal [facility]) joint of right thumb 04/25/2021   Sinusitis 01/14/2021   Annual physical exam 11/13/2020   Panniculitis 11/13/2020   Macromastia  11/13/2020   Right ovarian cyst 07/27/2020   Status post total replacement of left hip 02/17/2020   Unilateral primary osteoarthritis, left hip 02/16/2020   Diarrhea    B12 deficiency 01/08/2018   History of pulmonary embolism 01/08/2018   Status post total left knee replacement 10/13/2017   Family history of thyroid disease 08/25/2017   Chronic pain of left knee 05/20/2017   Unilateral primary osteoarthritis, left knee 05/20/2017   Morbid obesity (HCC) 04/30/2017   Family history of ovarian cancer 02/17/2017   IBS (irritable bowel syndrome) 01/21/2017   Dyspnea 10/08/2016   Paroxysmal atrial fibrillation (HCC) 06/18/2016   Type 2 diabetes mellitus without complication, without long-term current use of insulin (HCC) 01/21/2016   H/O Spinal surgery 12/21/2015   Barrett's esophagus 12/21/2015   Hx of adenomatous polyp of colon 03/01/2002    REFERRING DIAG: Impingement syndrome, shoulder, right   THERAPY DIAG:  No diagnosis found.  Rationale for Evaluation and Treatment Rehabilitation  PERTINENT HISTORY: See PMH above   PRECAUTIONS: None    SUBJECTIVE:                                                                                                                                                                                     SUBJECTIVE STATEMENT:  Patient reports she was very sore following last visit, and she is starting to have more left shoulder pain now.   PAIN:  Are you having pain? Yes:  NPRS scale: 3/10 (8/10 when raising shoulder) Pain location: Right shoulder Pain description: Stabbing Aggravating factors: Moving the right shoulder Relieving factors: Rest, heat while applied,    OBJECTIVE: (objective measures completed at initial evaluation unless otherwise dated) PATIENT SURVEYS:  FOTO 52% functional status   POSTURE: Rounded shoulders, increased thoracic kyphosis, forward head    UPPER EXTREMITY ROM:    Passive ROM Right eval Left eval  Shoulder  flexion 90    Shoulder extension      Shoulder abduction 60    Shoulder adduction  Shoulder internal rotation Unable to reach behind back    Shoulder external rotation 50    Elbow flexion WNL    Elbow extension WNL    Wrist flexion      Wrist extension      Wrist ulnar deviation      Wrist radial deviation      Wrist pronation      Wrist supination      (Blank rows = not tested)   PROM left UE grossly limited equal to AROM due to pain and guarding, elbow PROM grossly WFL but pain at end ranges   UPPER EXTREMITY MMT:                         Strength not assessed due to pain   MMT Right eval Left eval  Shoulder flexion      Shoulder extension      Shoulder abduction      Shoulder adduction      Shoulder internal rotation      Shoulder external rotation      Middle trapezius      Lower trapezius      Elbow flexion      Elbow extension      Wrist flexion      Wrist extension      Wrist ulnar deviation      Wrist radial deviation      Wrist pronation      Wrist supination      Grip strength (lbs)      (Blank rows = not tested)   SHOULDER SPECIAL TESTS: Not able to assess due to painb   JOINT MOBILITY TESTING:  Not assessed due to pain   PALPATION:  Tender to light muscular palpation globally around right shoulder and upper arm with exquisite tenderness to lateral aspect of upper arm and AC joint region               TODAY'S TREATMENT:          [facility] Adult PT Treatment:                                                DATE: 06/05/2022 Therapeutic Exercise: Seated overhead pulley x 4 min Seated shoulder flexion with physoball 2 x 10 Seated shoulder blade squeezes x 10 Standing shoulder isometrics at wall flexion, abduction, extension, ER, IR 5 x 5 sec each Manual: PROM right shoulder and elbow   [facility] Adult PT Treatment:                                                DATE: 05/19/2022 Therapeutic Exercise: Seated overhead pulley x 4 min Seated shoulder  flexion with physoball 2 x 10 Seated shoulder blade squeezes x 10 Standing shoulder isometrics at wall flexion, abduction, extension, ER, IR 5 x 5 sec each Manual: PROM right shoulder and elbow  [facility] Adult PT Treatment:                                                DATE:  05/14/2022 Therapeutic Exercise: Sidelying shoulder ER x 10 Seated table slide shoulder flexion x 5   PATIENT EDUCATION: Education details: HEP update Person educated: Patient Education method: Explanation, Demonstration, Tactile cues, Verbal cues, Handout Education comprehension: verbalized understanding, returned demonstration, verbal cues required, tactile cues required, and needs further education   HOME EXERCISE PROGRAM: Access Code: [email or web address]      ASSESSMENT: CLINICAL IMPRESSION: Patient tolerated therapy well with no adverse effects. *** Patient would benefit from continued skilled PT to progress her mobility and strength in order to reduce pain and maximize functional ability.  Therapy focused on progressing right shoulder motion and strength as tolerated. She continues to exhibit limitations in her shoulder active and passive motions due to pain. Initiated shoulder isometrics and periscapular activation of moderate tolerance. She reports shoulder pain with all exercises. Updated HEP to postural control.      OBJECTIVE IMPAIRMENTS: decreased activity tolerance, decreased ROM, decreased strength, impaired UE functional use, postural dysfunction, and pain.    ACTIVITY LIMITATIONS: carrying, lifting, sleeping, bathing, dressing, reach over head, hygiene/grooming, and caring for others   PARTICIPATION LIMITATIONS: meal prep, cleaning, laundry, and shopping   PERSONAL FACTORS: Fitness, Past/current experiences, Time since onset of injury/illness/exacerbation, and 3+ comorbidities: see PMH above  are also affecting patient's functional outcome.      GOALS: Goals reviewed with patient? Yes   SHORT TERM GOALS:  Target date: 06/11/2022   Patient will be I with initial HEP in order to progress with therapy. Baseline: HEP provided at eval Goal status: INITIAL   2.  PT will review FOTO with patient by 3rd visit in order to understand expected progress and outcome with therapy. Baseline: FOTO assessed at eval Goal status: INITIAL   3.  Patient will report right shoulder pain </= 5/10 with active motion to reduce functional limitations Baseline: 8/10 pain Goal status: INITIAL   LONG TERM GOALS: Target date: 07/09/2022   Patient will be I with final HEP to maintain progress from PT. Baseline: HEP provided at eval Goal status: INITIAL   2.  Patient will report >/= 64% status on FOTO to indicate improved functional ability. Baseline: 52% Goal status: INITIAL   3.  Patient will demonstrate right shoulder flexion AROM >/= 120 deg in order to improve reach into higher cabinet or improve dressing/grooming tasks without limitation Baseline: 90 deg Goal status: INITIAL   4.  Patient will be able to reach behind back to L5 in order to improve dressing and bathing ability Baseline: unable to reach behind her back at eval Goal status: INITIAL   5. Patient will demonstrate right shoulder strength grossly >/= 4/5 MMT in order to improve her ability to take care of her husband and mother without increased pain or limitation            Baseline: not assessed at eval due to pain            Goal status: INITIAL     PLAN: PT FREQUENCY: 1-2x/week   PT DURATION: 8 weeks   PLANNED INTERVENTIONS: Therapeutic exercises, Therapeutic activity, Neuromuscular re-education, Balance training, Gait training, Patient/Family education, Self Care, Joint mobilization, Joint manipulation, Aquatic Therapy, Dry Needling, Electrical stimulation, Cryotherapy, Moist heat, Taping, Ionotophoresis 4mg /ml Dexamethasone, Manual therapy, and Re-evaluation   PLAN FOR NEXT SESSION: Review HEP and progress PRN, manual/modalities for  pain relief of right shoulder and upper arm, PROM right shoulder progressing AAROM and AROM as tolerated, trial shoulder isometrics as able   Rosana Hoes, PT, DPT,  LAT, ATC 06/05/22  7:59 AM Phone: [phone] Fax: [phone]

## 2022-06-09 ENCOUNTER — Ambulatory Visit (INDEPENDENT_AMBULATORY_CARE_PROVIDER_SITE_OTHER): Payer: Medicare Other | Admitting: Sports Medicine

## 2022-06-09 DIAGNOSIS — M25511 Pain in right shoulder: Secondary | ICD-10-CM

## 2022-06-09 DIAGNOSIS — G8929 Other chronic pain: Secondary | ICD-10-CM

## 2022-06-09 DIAGNOSIS — R42 Dizziness and giddiness: Secondary | ICD-10-CM

## 2022-06-09 MED ORDER — AZITHROMYCIN 250 MG PO TABS: ORAL_TABLET | ORAL | 0 refills | Status: DC

## 2022-06-09 MED ORDER — DIAZEPAM 5 MG PO TABS: 5.0000 mg | ORAL_TABLET | Freq: Three times a day (TID) | ORAL | 0 refills | Status: DC | PRN

## 2022-06-09 MED ORDER — PREDNISONE 50 MG PO TABS: 50.0000 mg | ORAL_TABLET | Freq: Every day | ORAL | 0 refills | Status: DC

## 2022-06-09 NOTE — Progress Notes (Signed)
    Procedures performed today:    None.  Independent interpretation of notes and tests performed by another provider:   None.  Brief History, Exam, Impression, and Recommendations:    Vertigo Pleasant 72 year old female, she is having increasing vertigo after rolling over in bed. She also has worsening tinnitus. No trauma, no focal neurologic symptoms, on exam she does have a bulging right tympanic membrane. Vertigo is worse when turning her head to the right. Adding prednisone, Valium, vestibular rehab, azithromycin. We did discuss the Epley maneuver, return to see me if not better in a month.  Chronic right shoulder pain Increasing pain acromioclavicular joint as well as positive impingement signs. X-rays showed only acromioclavicular osteoarthritis. As she has failed formal conditioning and injection into the acromioclavicular joint we will proceed with MRI.    ____________________________________________ Gwen Her. Dianah Field, M.D., ABFM., CAQSM., AME. Primary Care and Sports Medicine [facility]  Adjunct Professor of [place] [employer] [facility] of Medicine  Risk manager

## 2022-06-09 NOTE — Assessment & Plan Note (Signed)
Increasing pain acromioclavicular joint as well as positive impingement signs. X-rays showed only acromioclavicular osteoarthritis. As she has failed formal conditioning and injection into the acromioclavicular joint we will proceed with MRI.

## 2022-06-09 NOTE — Assessment & Plan Note (Signed)
Pleasant 72 year old female, she is having increasing vertigo after rolling over in bed. She also has worsening tinnitus. No trauma, no focal neurologic symptoms, on exam she does have a bulging right tympanic membrane. Vertigo is worse when turning her head to the right. Adding prednisone, Valium, vestibular rehab, azithromycin. We did discuss the Epley maneuver, return to see me if not better in a month.

## 2022-06-11 ENCOUNTER — Ambulatory Visit: Payer: Medicare Other | Admitting: Physical Therapy

## 2022-06-12 ENCOUNTER — Other Ambulatory Visit: Payer: Self-pay

## 2022-06-12 ENCOUNTER — Ambulatory Visit: Payer: Medicare Other | Attending: Sports Medicine | Admitting: Physical Therapy

## 2022-06-12 ENCOUNTER — Encounter: Payer: Self-pay | Admitting: Physical Therapy

## 2022-06-12 DIAGNOSIS — R2681 Unsteadiness on feet: Secondary | ICD-10-CM

## 2022-06-12 DIAGNOSIS — M7541 Impingement syndrome of right shoulder: Secondary | ICD-10-CM | POA: Insufficient documentation

## 2022-06-12 DIAGNOSIS — R42 Dizziness and giddiness: Secondary | ICD-10-CM

## 2022-06-12 DIAGNOSIS — M6281 Muscle weakness (generalized): Secondary | ICD-10-CM | POA: Insufficient documentation

## 2022-06-12 DIAGNOSIS — G8929 Other chronic pain: Secondary | ICD-10-CM | POA: Insufficient documentation

## 2022-06-12 DIAGNOSIS — M25511 Pain in right shoulder: Secondary | ICD-10-CM | POA: Insufficient documentation

## 2022-06-12 NOTE — Therapy (Signed)
OUTPATIENT PHYSICAL THERAPY VESTIBULAR EVALUATION  Patient Name: [name] MRN: [id number] DOB:[birth year], 72 y.o., female Today's Date: 06/12/2022  END OF SESSION:  PT End of Session - 06/12/22 1442     Visit Number 1    Number of Visits 6    Date for PT Re-Evaluation 07/24/22    Authorization Type UHC MCR    Progress Note Due on Visit 10    PT Start Time 1445    PT Stop Time 1530    PT Time Calculation (min) 45 min    Activity Tolerance Patient tolerated treatment well    Behavior During Therapy WFL for tasks assessed/performed             Past Medical History:  Diagnosis Date   Abnormal thyroid function test    Allergy    Anemia    she attributes previously to fibroids, she declines   Barrett's esophagus    Blood transfusion without reported diagnosis    DDD (degenerative disc disease), cervical    DJD (degenerative joint disease)    Dysrhythmia    GERD (gastroesophageal reflux disease)    Heart murmur    History of bronchitis    History of hiatal hernia    Hx of adenomatous polyp of colon 03/01/2002   Myocardial infarction (HCC)    OA (osteoarthritis)    Obese    Paroxysmal atrial fibrillation (HCC)    PE (pulmonary embolism) 12/21/2015   Plantar fasciitis    Pneumonia    PONV (postoperative nausea and vomiting)    pt. reports that she woke up during surgery   Pre-diabetes    Pulmonary embolism [facility])    Past Surgical History:  Procedure Laterality Date   ABDOMINAL HYSTERECTOMY     Arthroscopic surgery left knee     BACK SURGERY     2005 ,  2017   BIOPSY  07/26/2019   Procedure: BIOPSY;  Surgeon: Iva Boop, MD;  Location: [facility];  Service: Endoscopy;;   CESAREAN SECTION     CHOLECYSTECTOMY     COLONOSCOPY  2003, 2011   3 mm adenoma 2011   COLONOSCOPY WITH PROPOFOL N/A 07/26/2019   Procedure: COLONOSCOPY WITH PROPOFOL;  Surgeon: Iva Boop, MD;  Location: [facility];  Service: Endoscopy;  Laterality: N/A;    ESOPHAGOGASTRODUODENOSCOPY  multiple since 2003   ESOPHAGOGASTRODUODENOSCOPY (EGD) WITH PROPOFOL N/A 07/26/2019   Procedure: ESOPHAGOGASTRODUODENOSCOPY (EGD) WITH PROPOFOL;  Surgeon: Iva Boop, MD;  Location: [facility];  Service: Endoscopy;  Laterality: N/A;   IR GENERIC HISTORICAL  12/19/2015   IR ANGIOGRAM SELECTIVE EACH ADDITIONAL VESSEL 12/19/2015 [name], MD [facility]   IR GENERIC HISTORICAL  12/19/2015   IR ANGIOGRAM PULMONARY BILATERAL SELECTIVE 12/19/2015 [name], MD [facility]   IR GENERIC HISTORICAL  12/19/2015   IR INFUSION THROMBOL ARTERIAL INITIAL (MS) 12/19/2015 [name], MD [facility]   IR GENERIC HISTORICAL  12/19/2015   IR US GUIDE VASC ACCESS RIGHT 12/19/2015 [name], MD [facility]   IR GENERIC HISTORICAL  12/19/2015   IR [facility] SELECTIVE EACH ADDITIONAL VESSEL 12/19/2015 [name], MD [facility]   IR GENERIC HISTORICAL  12/20/2015   IR THROMB F/U EVAL ART/VEN FINAL DAY (MS) 12/20/2015 Gilmer Mor, DO [facility]   IR GENERIC HISTORICAL  12/19/2015   IR INFUSION THROMBOL ARTERIAL INITIAL (MS) 12/19/2015 [name], MD [facility]   IR RADIOLOGIST EVAL & MGMT  09/19/2020   PLANTAR FASCIA RELEASE     right  POLYPECTOMY  07/26/2019   Procedure: POLYPECTOMY;  Surgeon: Iva Boop, MD;  Location: [facility];  Service: Endoscopy;;   Right knee replacement     SPINAL FUSION  10/31/2015   revision at [facility]    TONSILLECTOMY     TOTAL HIP ARTHROPLASTY Left 02/17/2020   Procedure: LEFT TOTAL HIP ARTHROPLASTY ANTERIOR APPROACH;  Surgeon: Kathryne Hitch, MD;  Location: [facility];  Service: Orthopedics;  Laterality: Left;   TOTAL KNEE ARTHROPLASTY Left 10/13/2017   Procedure: LEFT TOTAL KNEE ARTHROPLASTY;  Surgeon: Kathryne Hitch, MD;  Location: [facility];  Service: Orthopedics;  Laterality: Left;   ULNAR TUNNEL RELEASE     right   VENOUS ABLATION     x 2   Patient Active Problem List   Diagnosis Date  Noted   Vertigo 06/09/2022   Chronic right shoulder pain 04/28/2022   Urinary frequency 03/31/2022   Benign essential hypertension 03/31/2022   Abdominal pain 08/20/2021   Arthritis of carpometacarpal [facility]) joint of right thumb 04/25/2021   Sinusitis 01/14/2021   Annual physical exam 11/13/2020   Panniculitis 11/13/2020   Macromastia 11/13/2020   Right ovarian cyst 07/27/2020   Status post total replacement of left hip 02/17/2020   Unilateral primary osteoarthritis, left hip 02/16/2020   Diarrhea    B12 deficiency 01/08/2018   History of pulmonary embolism 01/08/2018   Status post total left knee replacement 10/13/2017   Family history of thyroid disease 08/25/2017   Chronic pain of left knee 05/20/2017   Unilateral primary osteoarthritis, left knee 05/20/2017   Morbid obesity (HCC) 04/30/2017   Family history of ovarian cancer 02/17/2017   IBS (irritable bowel syndrome) 01/21/2017   Dyspnea 10/08/2016   Paroxysmal atrial fibrillation (HCC) 06/18/2016   Type 2 diabetes mellitus without complication, without long-term current use of insulin (HCC) 01/21/2016   H/O Spinal surgery 12/21/2015   Barrett's esophagus 12/21/2015   Hx of adenomatous polyp of colon 03/01/2002    PCP: Rodney Langton, MD REFERRING PROVIDER: Rodney Langton MD  REFERRING DIAG: R42 (ICD-10-CM) - Vertigo  THERAPY DIAG:  Dizziness and giddiness  Unsteadiness on feet  ONSET DATE: 06/07/22  Rationale for Evaluation and Treatment: Rehabilitation  SUBJECTIVE:   SUBJECTIVE STATEMENT: Pt reports she had an ear infection this past Saturday. Pt states she's been having vertigo. She's been having tinnitus x10 days. Saturday morning she tried to change position in bed and "my head took off." Pt reports pain in her neck. Pt has had vertigo before but this has been the worst. It is better than it was because now she can walk. Pt looked at Epley maneuver but thinks it may be hard to do because of her  back.  Pt accompanied by: self  PERTINENT HISTORY: Vertigo, history of ear infections, back issues, extra vertebrae in neck  PAIN:  Are you having pain? No  PRECAUTIONS: Fall  WEIGHT BEARING RESTRICTIONS: No  FALLS: Has patient fallen in last 6 months? No  LIVING ENVIRONMENT: Lives with: lives with their family Lives in: House/apartment Has following equipment at home: Single point cane  PLOF: Independent  PATIENT GOALS: Improve dizziness  OBJECTIVE:   DIAGNOSTIC FINDINGS: Shoulder x-ray 04/28/22: IMPRESSION: 1. No fracture or acute finding. 2. Mild to moderate AC joint osteoarthritis.  COGNITION: Overall cognitive status: Within functional limits for tasks assessed   SENSATION: WFL  POSTURE:  rounded shoulders, forward head, and increased thoracic kyphosis  Cervical ROM:    Active A/PROM (deg) eval  Flexion  WFL  Extension ~50%   Right lateral flexion WFL  Left lateral flexion WFL  Right rotation WFL  Left rotation WFL  (Blank rows = not tested)  STRENGTH: did not assess  LOWER EXTREMITY MMT: did not assess  MMT Right eval Left eval  Hip flexion    Hip abduction    Hip adduction    Hip internal rotation    Hip external rotation    Knee flexion    Knee extension    Ankle dorsiflexion    Ankle plantarflexion    Ankle inversion    Ankle eversion    (Blank rows = not tested)  BED MOBILITY:  Sit <> sidelying SBA Did not assess rolling or supine  TRANSFERS: Assistive device utilized: Single point cane  Sit to stand: SBA has to have use of UEs Stand to sit: SBA has to have use of UEs Chair to chair: Complete Independence Floor:  did not assess  GAIT: Gait pattern: step to pattern, decreased step length- Right, decreased step length- Left, antalgic, and trunk flexed Distance walked: 48' Assistive device utilized: Single point cane Level of assistance: Modified independence  PATIENT SURVEYS:  DHI 58/100  VESTIBULAR ASSESSMENT:  GENERAL  OBSERVATION: Slow movements, uses cane   SYMPTOM BEHAVIOR:  Subjective history: Years since last episode of vertigo  Non-Vestibular symptoms: tinnitus  Type of dizziness: Imbalance (Disequilibrium), Unsteady with head/body turns, and "World moves"  Frequency: Daily  Duration: 10-15 sec  Aggravating factors: Induced by position change: rolling to the right and Induced by motion: occur when walking and bending down to the ground  Relieving factors: slow movements  Progression of symptoms: better  OCULOMOTOR EXAM:  Ocular Alignment: normal  Ocular ROM: No Limitations  Spontaneous Nystagmus: absent  Gaze-Induced Nystagmus: right beating with left gaze  Smooth Pursuits: intact  Saccades: intact   FRENZEL - FIXATION SUPRESSED: N/A  VESTIBULAR - OCULAR REFLEX:   Slow VOR: Normal  VOR: Normal but reduced R head turn  VOR Cancellation: Normal  Head-Impulse Test: HIT Right: negative HIT Left: negative  Dynamic Visual Acuity: Not able to be assessed   POSITIONAL TESTING: Right Sidelying: Unable to visualize nystagmus; latency ~25 sec and Duration: ~30 sec Left Sidelying: Unable to visualize nystagmus but pt symptomatic and reports feeling of movement and Duration: 12 sec  MOTION SENSITIVITY:  Motion Sensitivity Quotient Intensity: 0 = none, 1 = Lightheaded, 2 = Mild, 3 = Moderate, 4 = Severe, 5 = Vomiting  Intensity  1. Sitting to supine   2. Supine to L side   3. Supine to R side   4. Supine to sitting   5. L Hallpike-Dix   6. Up from L    7. R Hallpike-Dix   8. Up from R    9. Sitting, head tipped to L knee   10. Head up from L knee   11. Sitting, head tipped to R knee   12. Head up from R knee   13. Sitting head turns x5   14.Sitting head nods x5   15. In stance, 180 turn to L    16. In stance, 180 turn to R     OTHOSTATICS: not done  FUNCTIONAL GAIT:  Did not assess   VESTIBULAR TREATMENT:  DATE: 06/12/22  Canalith Repositioning:  Semont Right Posterior: Number of Reps: 1 and Response to Treatment: symptoms improved and Semont Left Posterior: Number of Reps: 1 and Response to Treatment: symptoms improved   PATIENT EDUCATION: Education details: Exam findings, POC, canalith repositioning Person educated: Patient Education method: Explanation, Demonstration, Tactile cues, Verbal cues, and Handouts Education comprehension: verbalized understanding, returned demonstration, and needs further education  HOME EXERCISE PROGRAM:  GOALS: Goals reviewed with patient? Yes  SHORT TERM GOALS: Target date: 07/03/2022   Pt will be ind with canalith repositioning at home Baseline: Goal status: INITIAL   LONG TERM GOALS: Target date: 07/24/2022  Pt will have no s/s of BPPV with canalith testing in all positions  Baseline:  Goal status: INITIAL  2.  Pt will report no dizziness with bending forward for home tasks Baseline:  Goal status: INITIAL  3.  Pt will have improved DHI to 40 points to demo decrease in severity of symptoms Baseline:  Goal status: INITIAL   ASSESSMENT:  CLINICAL IMPRESSION: Patient is a 72 y.o. F who was seen today for physical therapy evaluation and treatment for vertigo. Assessment significant for bilat ear BPPV. R ear demos s/s of posterior cupulolithiasis and L ear with posterior canalithiasis during sidelying Dix-Hallpike. Unable to visualize nystagmus in room lighting; however, pt reports room moving sensation. Pt unable to tolerate supine Dix-Hallpike or Epley maneuver due to chronic back pain. Able to tolerate Semont maneuver well. Only able to do 1 rep on R & L ear this session. Provided pt handout on how to perform maneuver at home and instructed pt to call clinic if continued issues or any worsening of dizziness.   OBJECTIVE IMPAIRMENTS: Abnormal gait, decreased balance, decreased mobility, difficulty walking, and dizziness.    ACTIVITY LIMITATIONS: bending, transfers, bed mobility, hygiene/grooming, locomotion level, and caring for others  PARTICIPATION LIMITATIONS: meal prep, cleaning, laundry, shopping, and community activity  PERSONAL FACTORS: Age, Past/current experiences, and Time since onset of injury/illness/exacerbation are also affecting patient's functional outcome.   REHAB POTENTIAL: Good  CLINICAL DECISION MAKING: Stable/uncomplicated  EVALUATION COMPLEXITY: Low   PLAN:  PT FREQUENCY: 1x/week  PT DURATION: 6 weeks  PLANNED INTERVENTIONS: Therapeutic exercises, Therapeutic activity, Neuromuscular re-education, Balance training, Gait training, Patient/Family education, Self Care, Joint mobilization, Vestibular training, Canalith repositioning, Dry Needling, Cryotherapy, Moist heat, Manual therapy, and Re-evaluation  PLAN FOR NEXT SESSION: Check for BPPV. Canalith repositioning as indicated. Initiate VOR/gaze stabilization.    Derrell Milanes April Ma L Ryver Poblete, PT 06/12/2022, 3:38 PM

## 2022-06-17 ENCOUNTER — Other Ambulatory Visit: Payer: Medicare Other

## 2022-06-21 ENCOUNTER — Other Ambulatory Visit: Payer: Medicare Other

## 2022-06-21 ENCOUNTER — Encounter: Payer: Self-pay | Admitting: Sports Medicine

## 2022-06-28 ENCOUNTER — Other Ambulatory Visit: Payer: Medicare Other

## 2022-07-05 ENCOUNTER — Other Ambulatory Visit: Payer: Medicare Other

## 2022-07-07 ENCOUNTER — Encounter: Payer: Self-pay | Admitting: Sports Medicine

## 2022-07-07 ENCOUNTER — Ambulatory Visit (INDEPENDENT_AMBULATORY_CARE_PROVIDER_SITE_OTHER): Payer: Medicare Other | Admitting: Sports Medicine

## 2022-07-07 ENCOUNTER — Ambulatory Visit (INDEPENDENT_AMBULATORY_CARE_PROVIDER_SITE_OTHER): Payer: Medicare Other

## 2022-07-07 VITALS — BP 150/93 | HR 107

## 2022-07-07 DIAGNOSIS — M7989 Other specified soft tissue disorders: Secondary | ICD-10-CM | POA: Diagnosis not present

## 2022-07-07 DIAGNOSIS — R42 Dizziness and giddiness: Secondary | ICD-10-CM | POA: Diagnosis not present

## 2022-07-07 DIAGNOSIS — M79604 Pain in right leg: Secondary | ICD-10-CM

## 2022-07-07 DIAGNOSIS — K582 Mixed irritable bowel syndrome: Secondary | ICD-10-CM

## 2022-07-07 DIAGNOSIS — M79661 Pain in right lower leg: Secondary | ICD-10-CM

## 2022-07-07 MED ORDER — AMOXICILLIN-POT CLAVULANATE 875-125 MG PO TABS: 1.0000 | ORAL_TABLET | Freq: Two times a day (BID) | ORAL | 0 refills | Status: DC

## 2022-07-07 NOTE — Assessment & Plan Note (Signed)
Increasing pain and swelling right calf, adding DVT ultrasound.

## 2022-07-07 NOTE — Assessment & Plan Note (Signed)
Unfortunately has not had any improvement in vertiginous symptoms, ear pain, right-sided facial numbness. Historically symptoms were consistent with benign positional vertigo worse after rolling over in bed. She also had worsening tinnitus. She had no trauma, at the time no focal neurologic symptoms though she is complaining of pain and some numbness now right mandible. We added prednisone, Valium, vestibular rehab, azithromycin, she has not improved. 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 Adding Augmentin. I did suggest brain imaging considering numbness on the right side of the face, patient declines for now but we do need to keep this on the back burner.

## 2022-07-07 NOTE — Assessment & Plan Note (Signed)
Worsening IBS symptoms, diarrhea, dry heaves. She would like to get back in with Dr. Bryan Lemma.

## 2022-07-07 NOTE — Progress Notes (Signed)
    Procedures performed today:    None.  Independent interpretation of notes and tests performed by another provider:   None.  Brief History, Exam, Impression, and Recommendations:    Pain and swelling of right lower extremity Increasing pain and swelling right calf, adding DVT ultrasound.  Vertigo Unfortunately has not had any improvement in vertiginous symptoms, ear pain, right-sided facial numbness. Historically symptoms were consistent with benign positional vertigo worse after rolling over in bed. She also had worsening tinnitus. She had no trauma, at the time no focal neurologic symptoms though she is complaining of pain and some numbness now right mandible. We added prednisone, Valium, vestibular rehab, azithromycin, she has not improved. 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 Adding Augmentin. I did suggest brain imaging considering numbness on the right side of the face, patient declines for now but we do need to keep this on the back burner.  IBS (irritable bowel syndrome) Worsening IBS symptoms, diarrhea, dry heaves. She would like to get back in with Dr. Bryan Lemma.  I spent 30 minutes of total time managing this patient today, this includes chart review, face to face, and non-face to face time.  ____________________________________________ Gwen Her. Dianah Field, M.D., ABFM., CAQSM., AME. Primary Care and Sports Medicine [facility]  Adjunct Professor of [place] [employer] [facility] of Medicine  Risk manager

## 2022-07-13 ENCOUNTER — Other Ambulatory Visit: Payer: Medicare Other

## 2022-07-16 ENCOUNTER — Encounter: Payer: Self-pay | Admitting: Sports Medicine

## 2022-07-16 NOTE — Telephone Encounter (Signed)
No blindly prescribed antibiotics, she will need to see one of my partners, there are always openings,be it here, or Mackie Pai

## 2022-07-16 NOTE — Telephone Encounter (Signed)
Patient scheduled.

## 2022-07-17 ENCOUNTER — Encounter: Payer: Self-pay | Admitting: Family Medicine

## 2022-07-17 ENCOUNTER — Ambulatory Visit (INDEPENDENT_AMBULATORY_CARE_PROVIDER_SITE_OTHER): Payer: Medicare Other | Admitting: Family Medicine

## 2022-07-17 VITALS — BP 131/78 | HR 90 | Temp 98.7°F | Wt 267.0 lb

## 2022-07-17 DIAGNOSIS — H65191 Other acute nonsuppurative otitis media, right ear: Secondary | ICD-10-CM | POA: Diagnosis not present

## 2022-07-17 DIAGNOSIS — H9203 Otalgia, bilateral: Secondary | ICD-10-CM

## 2022-07-17 DIAGNOSIS — J029 Acute pharyngitis, unspecified: Secondary | ICD-10-CM | POA: Diagnosis not present

## 2022-07-17 DIAGNOSIS — J0100 Acute maxillary sinusitis, unspecified: Secondary | ICD-10-CM

## 2022-07-17 LAB — POCT RAPID STREP A (OFFICE): Rapid Strep A Screen: NEGATIVE

## 2022-07-17 MED ORDER — AMOXICILLIN-POT CLAVULANATE 875-125 MG PO TABS: 1.0000 | ORAL_TABLET | Freq: Two times a day (BID) | ORAL | 0 refills | Status: DC

## 2022-07-17 MED ORDER — PREDNISONE 20 MG PO TABS: 40.0000 mg | ORAL_TABLET | Freq: Every day | ORAL | 0 refills | Status: DC

## 2022-07-17 NOTE — Progress Notes (Signed)
Pt reports that her sxs started 1 month ago. The ear pain started in her R ear and has now moved over into her L ear. She also c/o some sore throat pain and vertigo. She said that the vertigo is not as bad as it was. I asked if she was taking any OTC meds for this she said that she wasn't. Denies any f/s/c.   She said that she has a hx of ear issues primarily her R ear.   Pt was seen by Dr. Dianah Field on 2/19 and was given Prednisone, Azithromycin and sent to Vestibular Rehab. She f/u on 3/18 and was started on Augmentin. She took 1 this morning and has 1 tablet left. She told me that it usually takes 2 rounds of the Augmentin for this to clear out.

## 2022-07-17 NOTE — Progress Notes (Signed)
Acute Office Visit  Subjective:     Patient ID: [name], female    DOB: 04-22-1950, 72 y.o.   MRN: [id number]  Chief Complaint  Patient presents with   Ear Pain    HPI Patient is in today for Pt reports that her sxs started 1 month ago. The ear pain started in her R ear and has now moved over into her L ear. She also c/o some sore throat pain and vertigo. She said that the vertigo is not as bad as it was. I asked if she was taking any OTC meds for this she said that she wasn't. Denies any f/s/c.    She said that she has a hx of ear issues primarily her R ear.    Pt was seen by Dr. Benjamin Stain on 2/19 and was given Prednisone, Azithromycin and sent to Vestibular Rehab. She f/u on 3/18 and was started on Augmentin. She took 1 this morning and has 1 tablet left. She told me that it usually takes 2 rounds of the Augmentin for this to clear out.   She feels like in general her vertigo is better than it was.  She is now starting to get some pain in her left ear.  She still has a lot of sinus pressure and pain in her right maxillary sinus and around her right eye.  She says she has a history of recurrent sinusitis.  She says that the right ear is still really painful and bothersome.  He also reports for the last day or 2 that she has had a really sore throat.  It is like a sharp pain in the back of her throat it is worse with swallowing.  She wonders if she could be getting strep throat.  Her son who has been around her.  He is also been sick.  She reports that she has been running a fever on and off this week.  ROS      Objective:    BP 131/78   Pulse 90   Temp 98.7 F (37.1 C)   Wt 267 lb (121.1 kg)   SpO2 93%   BMI 50.45 kg/m     Physical Exam Constitutional:      Appearance: She is well-developed.  HENT:     Head: Normocephalic and atraumatic.     Right Ear: External ear normal.     Left Ear: External ear normal.     Ears:     Comments: Left TM blocked by  cerumen.  Right TM is clear I can see the ossicle well.  Light reflex is in the right place.  There is an effusion.  No erythema.    Nose: Nose normal.  Eyes:     Conjunctiva/sclera: Conjunctivae normal.     Pupils: Pupils are equal, round, and reactive to light.  Neck:     Thyroid: No thyromegaly.  Cardiovascular:     Rate and Rhythm: Normal rate and regular rhythm.     Heart sounds: Normal heart sounds.  Pulmonary:     Effort: Pulmonary effort is normal.     Breath sounds: Normal breath sounds. No wheezing.  Musculoskeletal:     Cervical back: Neck supple.  Lymphadenopathy:     Cervical: No cervical adenopathy.  Skin:    General: Skin is warm and dry.  Neurological:     Mental Status: She is alert and oriented to person, place, and time.     Results for orders placed or  performed in visit on 07/17/22  POCT rapid strep A  Result Value Ref Range   Rapid Strep A Screen Negative Negative        Assessment & Plan:   Problem List Items Addressed This Visit       Respiratory   Sinusitis - Primary   Relevant Medications   predniSONE (DELTASONE) 20 MG tablet   amoxicillin-clavulanate (AUGMENTIN) 875-125 MG tablet   Other Visit Diagnoses     Acute effusion of right ear       Relevant Medications   amoxicillin-clavulanate (AUGMENTIN) 875-125 MG tablet   Acute ear pain, bilateral       Sore throat       Relevant Orders   POCT rapid strep A (Completed)      Bilateral ear pain with right effusion-no active sign of infection but she does have an effusion though she is having persistent significant pain in her right maxillary sinus and around her right eye.  I do not think would be unreasonable to go ahead and treat for sinusitis which could be triggering some of the effusion.  We also discussed doing a round of prednisone.  If she is not better after this round then she will need to be seen by ENT for further workup.  Strep throat is normal.    Meds ordered this  encounter  Medications   predniSONE (DELTASONE) 20 MG tablet    Sig: Take 2 tablets (40 mg total) by mouth daily with breakfast.    Dispense:  10 tablet    Refill:  0   amoxicillin-clavulanate (AUGMENTIN) 875-125 MG tablet    Sig: Take 1 tablet by mouth 2 (two) times daily.    Dispense:  20 tablet    Refill:  0    No follow-ups on file.  Nani Gasser, MD

## 2022-07-19 ENCOUNTER — Ambulatory Visit (INDEPENDENT_AMBULATORY_CARE_PROVIDER_SITE_OTHER): Payer: Medicare Other

## 2022-07-19 DIAGNOSIS — M25511 Pain in right shoulder: Secondary | ICD-10-CM

## 2022-07-19 DIAGNOSIS — G8929 Other chronic pain: Secondary | ICD-10-CM | POA: Diagnosis not present

## 2022-07-31 ENCOUNTER — Ambulatory Visit: Payer: Medicare Other | Admitting: Sports Medicine

## 2022-08-07 ENCOUNTER — Other Ambulatory Visit (INDEPENDENT_AMBULATORY_CARE_PROVIDER_SITE_OTHER): Payer: Medicare Other

## 2022-08-07 ENCOUNTER — Ambulatory Visit (INDEPENDENT_AMBULATORY_CARE_PROVIDER_SITE_OTHER): Payer: Medicare Other | Admitting: Sports Medicine

## 2022-08-07 DIAGNOSIS — G8929 Other chronic pain: Secondary | ICD-10-CM

## 2022-08-07 DIAGNOSIS — M25511 Pain in right shoulder: Secondary | ICD-10-CM

## 2022-08-07 NOTE — Assessment & Plan Note (Signed)
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 Today we did a glenohumeral joint injection, return to see me in 6 weeks.

## 2022-08-07 NOTE — Progress Notes (Signed)
    Procedures performed today:    Procedure: Real-time Ultrasound Guided injection of the right glenohumeral joint Device: Samsung HS60  Verbal informed consent obtained.  Time-out conducted.  Noted no overlying erythema, induration, or other signs of local infection.  Skin prepped in a sterile fashion.  Local anesthesia: Topical Ethyl chloride.  With sterile technique and under real time ultrasound guidance: Arthritic joint noted, 1 cc Kenalog 40, 2 cc lidocaine, 2 cc bupivacaine injected easily Completed without difficulty  Advised to call if fevers/chills, erythema, induration, drainage, or persistent bleeding.  Images permanently stored and available for review in PACS.  Impression: Technically successful ultrasound guided injection.  Independent interpretation of notes and tests performed by another provider:   None.  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 Today we did a glenohumeral joint injection, return to see me in 6 weeks.    ____________________________________________ Ihor Austin. Benjamin Stain, M.D., ABFM., CAQSM., AME. Primary Care and Sports Medicine [facility]  Adjunct Professor of Family Medicine  [employer]  Restaurant manager, fast food

## 2022-08-14 ENCOUNTER — Encounter: Payer: Self-pay | Admitting: Sports Medicine

## 2022-08-15 ENCOUNTER — Encounter: Payer: Self-pay | Admitting: Family Medicine

## 2022-08-15 ENCOUNTER — Ambulatory Visit (INDEPENDENT_AMBULATORY_CARE_PROVIDER_SITE_OTHER): Payer: Medicare Other

## 2022-08-15 ENCOUNTER — Ambulatory Visit (INDEPENDENT_AMBULATORY_CARE_PROVIDER_SITE_OTHER): Payer: Medicare Other | Admitting: Family Medicine

## 2022-08-15 VITALS — BP 132/81 | HR 87 | Ht 61.0 in | Wt 267.0 lb

## 2022-08-15 DIAGNOSIS — G510 Bell's palsy: Secondary | ICD-10-CM

## 2022-08-15 DIAGNOSIS — H9203 Otalgia, bilateral: Secondary | ICD-10-CM

## 2022-08-15 DIAGNOSIS — J329 Chronic sinusitis, unspecified: Secondary | ICD-10-CM

## 2022-08-15 DIAGNOSIS — Z23 Encounter for immunization: Secondary | ICD-10-CM

## 2022-08-15 MED ORDER — HYDROCODONE-ACETAMINOPHEN 5-325 MG PO TABS
1.0000 | ORAL_TABLET | Freq: Two times a day (BID) | ORAL | 0 refills | Status: DC | PRN
Start: 2022-08-15 — End: 2023-02-27

## 2022-08-15 MED ORDER — TRAMADOL HCL 50 MG PO TABS: 50.0000 mg | ORAL_TABLET | Freq: Three times a day (TID) | ORAL | 0 refills | Status: DC | PRN

## 2022-08-15 NOTE — Progress Notes (Signed)
Hi Shermeka, you have really small frontal sinuses.  They saw a little bit of thickening of the lining in the ethmoids but no sign of acute infection.  The sinus waves are open which means they are draining properly.  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  I had clicked the button that you are feeling a little weak in your face.  Somehow on the back and that translated into Bell's palsy but that is not what you have so I just wanted to make you aware.  I had just clicked facial weakness.  So I do not think we need to do the auditory brain MRI as noted below.  So at this point I would not recommend any antibiotics or steroids at this point.  Hopefully we can get you in with ENT a little sooner that will help.  But I am not really sure why you are having pain unless it is coming from something else such as your teeth.  You can always try to follow-up with Dr. Karie Schwalbe sooner as well if you are not able to get in.

## 2022-08-15 NOTE — Progress Notes (Addendum)
Acute Office Visit  Subjective:     Patient ID: [name], female    DOB: July 21, 1950, 72 y.o.   MRN: [id number]  Chief Complaint  Patient presents with   Ear Pain   tram HPI Patient is in today for  Pt reports that her R ear is causing her pain again.  This has been an ongoing issue for the last couple of months.  She has had 3 rounds of antibiotics and 2 rounds of prednisone.  And says it really has not helped she has had chronic sinusitis and ear issues most of her life.  She feels like the spring pollen has made it a little bit worse.  She says she is tried Flonase and Nasonex in the past and did not really find it helpful.  She is here today because her ear pain got much worse over the last week or so and really made it difficult for her to sleep.  She has tried multiple pain relievers including an NSAID, Tylenol, methocarbamol.  She is even tried her diazepam that she had and really could not get relief she took an old Vicodin that she had from her hip surgery 3 yr ago.     She has an appointment with ENT on 5/28    ROS      Objective:    BP 132/81   Pulse 87   Ht 5\' 1"  (1.549 m)   Wt 267 lb (121.1 kg)   SpO2 96%   BMI 50.45 kg/m    Physical Exam Constitutional:      Appearance: Normal appearance.  HENT:     Head: Normocephalic and atraumatic.     Right Ear: External ear normal.     Left Ear: Tympanic membrane, ear canal and external ear normal.     Ears:     Comments: There are a few bubbles behind the right TM but no erythema or irritation the canal is clear.    Nose: Nose normal.  Neurological:     Mental Status: She is alert.     No results found for any visits on 08/15/22.      Assessment & Plan:   Problem List Items Addressed This Visit       Respiratory   Sinusitis   Relevant Orders   CT Maxillofacial WO CM (Completed)   Other Visit Diagnoses     Encounter for immunization    -  Primary   Relevant Orders   Tdap vaccine greater  than or equal to 7yo IM (Completed)   CT Maxillofacial WO CM (Completed)   Ear pain, bilateral       Relevant Medications   HYDROcodone-acetaminophen (NORCO/VICODIN) 5-325 MG tablet   Other Relevant Orders   CT Maxillofacial WO CM (Completed)   Bell's palsy          Today here with severe ear and sinus pain radiating down into her neck and into the maxillary sinuses.  This has been going on for couple of months with 3 rounds of antibiotics and prednisone and not improving to the point that the pain is keeping her awake at night.  I think at this point the best option would be to move forward with sinus CT for further evaluation.  I do not think just another round of antibiotics and prednisone to go to provide relief.  Unfortunately ENT is booked out about 4 weeks since she is a new patient.  She is on a wait list.  Will call with results once available.  I did give her just a short prescription of hydrocodone to help her rest at night.  But let her know that that was all that we could provide.  We need to get to the underlying reason for what is going on and really treat the condition and not just the pain.   The pain does also radiate to her neck.  Consider other causes for ear pain such as TMJ etc.  CT does not require prior authorization per our imaging referral coordinator.  Will go ahead and get done this afternoon.  IMPRESSION: 1. Markedly hypoplastic frontal sinuses. 2. Trace mucosal thickening within the bilateral ethmoid sinuses. 3. The paranasal sinuses are otherwise normally aerated. 4. Patent sinus drainage pathways. 5. Given the provided history of facial paralysis/weakness/Bell's palsy, an internal auditory canal protocol brain MRI (with and without contrast) may be obtained for further evaluation, as clinically warranted.   Note: She is not having actual facial paralysis so I am not concerned about the auditory canal.  And it looks like sinuses are draining properly and no sign of  acute infection so I do not recommend antibiotics or prednisone at this point in time.  Ultimately I like to get her in with ENT sooner if at all possible.   Meds ordered this encounter  Medications   DISCONTD: traMADol (ULTRAM) 50 MG tablet    Sig: Take 1 tablet (50 mg total) by mouth every 8 (eight) hours as needed for up to 5 days.    Dispense:  15 tablet    Refill:  0   HYDROcodone-acetaminophen (NORCO/VICODIN) 5-325 MG tablet    Sig: Take 1 tablet by mouth 2 (two) times daily as needed for moderate pain.    Dispense:  10 tablet    Refill:  0    Please do not fill the tramadol.  Sent by mistake.    No follow-ups on file.  Nani Gasser, MD

## 2022-08-15 NOTE — Progress Notes (Signed)
Pt reports that her R ear is causing her pain again.   She has an appointment with ENT on 5/28 and is also on their cancellation list.

## 2022-09-12 ENCOUNTER — Encounter: Payer: Self-pay | Admitting: Internal Medicine

## 2022-09-16 DIAGNOSIS — H9201 Otalgia, right ear: Secondary | ICD-10-CM | POA: Diagnosis not present

## 2022-09-16 DIAGNOSIS — H6122 Impacted cerumen, left ear: Secondary | ICD-10-CM | POA: Diagnosis not present

## 2022-09-16 DIAGNOSIS — H9311 Tinnitus, right ear: Secondary | ICD-10-CM | POA: Diagnosis not present

## 2022-09-16 DIAGNOSIS — R42 Dizziness and giddiness: Secondary | ICD-10-CM | POA: Diagnosis not present

## 2022-09-18 ENCOUNTER — Other Ambulatory Visit: Payer: Self-pay | Admitting: Physician Assistant

## 2022-09-18 ENCOUNTER — Ambulatory Visit: Payer: Medicare Other | Admitting: Sports Medicine

## 2022-09-18 DIAGNOSIS — H9201 Otalgia, right ear: Secondary | ICD-10-CM

## 2022-09-18 DIAGNOSIS — R42 Dizziness and giddiness: Secondary | ICD-10-CM

## 2022-09-18 DIAGNOSIS — H9311 Tinnitus, right ear: Secondary | ICD-10-CM

## 2022-10-08 ENCOUNTER — Ambulatory Visit
Admission: RE | Admit: 2022-10-08 | Discharge: 2022-10-08 | Disposition: A | Discharge status: Home / Self Care | Payer: Medicare Other | Source: Ambulatory Visit | Attending: Physician Assistant | Admitting: Physician Assistant

## 2022-10-08 DIAGNOSIS — H9201 Otalgia, right ear: Secondary | ICD-10-CM | POA: Diagnosis not present

## 2022-10-08 DIAGNOSIS — H9311 Tinnitus, right ear: Secondary | ICD-10-CM

## 2022-10-08 DIAGNOSIS — R42 Dizziness and giddiness: Secondary | ICD-10-CM | POA: Diagnosis not present

## 2022-10-08 MED ORDER — GADOPICLENOL 0.5 MMOL/ML IV SOLN
10.0000 mL | Freq: Once | INTRAVENOUS | Status: AC | PRN
  Administered 2022-10-08: 10 mL via INTRAVENOUS

## 2022-10-14 ENCOUNTER — Encounter: Payer: Self-pay | Admitting: Sports Medicine

## 2022-10-15 ENCOUNTER — Encounter: Payer: Self-pay | Admitting: Sports Medicine

## 2022-11-12 ENCOUNTER — Telehealth: Payer: Self-pay | Admitting: Family Medicine

## 2022-11-14 NOTE — Telephone Encounter (Signed)
See note

## 2022-12-29 ENCOUNTER — Encounter: Payer: Self-pay | Admitting: Sports Medicine

## 2022-12-30 ENCOUNTER — Other Ambulatory Visit: Payer: Self-pay | Admitting: Sports Medicine

## 2022-12-30 DIAGNOSIS — N63 Unspecified lump in unspecified breast: Secondary | ICD-10-CM

## 2023-01-07 ENCOUNTER — Telehealth: Payer: Self-pay | Admitting: Family Medicine

## 2023-01-07 NOTE — Telephone Encounter (Signed)
Telephone Appointment (Needs diabetes follow-up, foot exam, eye exam)

## 2023-01-26 ENCOUNTER — Ambulatory Visit: Payer: Medicare Other | Admitting: Sports Medicine

## 2023-02-06 ENCOUNTER — Other Ambulatory Visit (INDEPENDENT_AMBULATORY_CARE_PROVIDER_SITE_OTHER): Payer: Medicare Other

## 2023-02-06 ENCOUNTER — Encounter: Payer: Self-pay | Admitting: Sports Medicine

## 2023-02-06 ENCOUNTER — Ambulatory Visit (INDEPENDENT_AMBULATORY_CARE_PROVIDER_SITE_OTHER): Payer: Medicare Other | Admitting: Sports Medicine

## 2023-02-06 VITALS — BP 155/98 | HR 98 | Ht 61.0 in | Wt 261.0 lb

## 2023-02-06 DIAGNOSIS — E119 Type 2 diabetes mellitus without complications: Secondary | ICD-10-CM | POA: Diagnosis not present

## 2023-02-06 DIAGNOSIS — M25511 Pain in right shoulder: Secondary | ICD-10-CM

## 2023-02-06 DIAGNOSIS — I10 Essential (primary) hypertension: Secondary | ICD-10-CM | POA: Diagnosis not present

## 2023-02-06 DIAGNOSIS — G8929 Other chronic pain: Secondary | ICD-10-CM

## 2023-02-06 LAB — POCT GLYCOSYLATED HEMOGLOBIN (HGB A1C): Hemoglobin A1C: 7.9 % — AB (ref 4.0–5.6)

## 2023-02-06 MED ORDER — TRIAMCINOLONE ACETONIDE 40 MG/ML IJ SUSP
40.0000 mg | Freq: Once | INTRAMUSCULAR | Status: AC
Start: 2023-02-06 — End: 2023-02-06
  Administered 2023-02-06: 40 mg via INTRAMUSCULAR

## 2023-02-06 NOTE — Assessment & Plan Note (Signed)
Blood pressure is elevated today, patient is in a great deal of pain. I would like her to come back in a nurse visit in about 2 weeks to recheck her blood pressure.

## 2023-02-06 NOTE — Progress Notes (Signed)
    Procedures performed today:    Procedure: Real-time Ultrasound Guided injection of the right glenohumeral joint Device: Samsung HS60  Verbal informed consent obtained.  Time-out conducted.  Noted no overlying erythema, induration, or other signs of local infection.  Skin prepped in a sterile fashion.  Local anesthesia: Topical Ethyl chloride.  With sterile technique and under real time ultrasound guidance: Arthritic joint noted, 1 cc Kenalog 40, 2 cc lidocaine, 2 cc bupivacaine injected easily Completed without difficulty  Advised to call if fevers/chills, erythema, induration, drainage, or persistent bleeding.  Images permanently stored and available for review in PACS.  Impression: Technically successful ultrasound guided injection.  Independent interpretation of notes and tests performed by another provider:   None.  Brief History, Exam, Impression, and Recommendations:    Chronic right shoulder pain [name], she is a 72 year old female, chronic right shoulder pain, MRI did show glenohumeral osteoarthritis, rotator cuff tendinosis as well as intra-articular loose bodies in the glenohumeral joint. She did well after an injection back in April of this year, now with recurrence of pain as well as mechanical symptoms, repeat injection if she continues to have discomfort we will refer for operative intervention.  Benign essential hypertension Blood pressure is elevated today, patient is in a great deal of pain. I would like her to come back in a nurse visit in about 2 weeks to recheck her blood pressure.  Type 2 diabetes mellitus without complication, without long-term current use of insulin (HCC) A1c 7.9%, continue current medications. We will get her additional lab work and set her up for a urine microalbumin as well as diabetic eye exam downstairs.    ____________________________________________ Ihor Austin. Benjamin Stain, M.D., ABFM., CAQSM., AME. Primary Care and  Sports Medicine [facility]  Adjunct Professor of Family Medicine  [employer]  Restaurant manager, fast food

## 2023-02-06 NOTE — Assessment & Plan Note (Signed)
[name] returns, she is a 72 year old female, chronic right shoulder pain, MRI did show glenohumeral osteoarthritis, rotator cuff tendinosis as well as intra-articular loose bodies in the glenohumeral joint. She did well after an injection back in April of this year, now with recurrence of pain as well as mechanical symptoms, repeat injection if she continues to have discomfort we will refer for operative intervention.

## 2023-02-06 NOTE — Assessment & Plan Note (Signed)
A1c 7.9%, continue current medications. We will get her additional lab work and set her up for a urine microalbumin as well as diabetic eye exam downstairs.

## 2023-02-07 ENCOUNTER — Encounter: Payer: Self-pay | Admitting: Sports Medicine

## 2023-02-07 LAB — COMPREHENSIVE METABOLIC PANEL
ALT: 12 [IU]/L (ref 0–32)
AST: 19 [IU]/L (ref 0–40)
Albumin: 4 g/dL (ref 3.8–4.8)
Alkaline Phosphatase: 102 [IU]/L (ref 44–121)
BUN/Creatinine Ratio: 14 (ref 12–28)
BUN: 12 mg/dL (ref 8–27)
Bilirubin Total: 0.9 mg/dL (ref 0.0–1.2)
CO2: 21 mmol/L (ref 20–29)
Calcium: 9.7 mg/dL (ref 8.7–10.3)
Chloride: 103 mmol/L (ref 96–106)
Creatinine, Ser: 0.87 mg/dL (ref 0.57–1.00)
Globulin, Total: 3.1 g/dL (ref 1.5–4.5)
Glucose: 154 mg/dL — ABNORMAL HIGH (ref 70–99)
Potassium: 4 mmol/L (ref 3.5–5.2)
Sodium: 139 mmol/L (ref 134–144)
Total Protein: 7.1 g/dL (ref 6.0–8.5)
eGFR: 71 mL/min/{1.73_m2} (ref >59)

## 2023-02-07 LAB — CBC
Hematocrit: 44.1 % (ref 34.0–46.6)
Hemoglobin: 14.3 g/dL (ref 11.1–15.9)
MCH: 28.1 pg (ref 26.6–33.0)
MCHC: 32.4 g/dL (ref 31.5–35.7)
MCV: 87 fL (ref 79–97)
Platelets: 241 10*3/uL (ref 150–450)
RBC: 5.09 x10E6/uL (ref 3.77–5.28)
RDW: 13.2 % (ref 11.7–15.4)
WBC: 7.2 10*3/uL (ref 3.4–10.8)

## 2023-02-07 LAB — LIPID PANEL
Chol/HDL Ratio: 4.5 {ratio} — ABNORMAL HIGH (ref 0.0–4.4)
Cholesterol, Total: 197 mg/dL (ref 100–199)
HDL: 44 mg/dL (ref >39)
LDL Chol Calc (NIH): 124 mg/dL — ABNORMAL HIGH (ref 0–99)
Triglycerides: 161 mg/dL — ABNORMAL HIGH (ref 0–149)
VLDL Cholesterol Cal: 29 mg/dL (ref 5–40)

## 2023-02-07 LAB — TSH: TSH: 2.69 u[IU]/mL (ref 0.450–4.500)

## 2023-02-08 ENCOUNTER — Other Ambulatory Visit: Payer: Self-pay | Admitting: Sports Medicine

## 2023-02-18 ENCOUNTER — Ambulatory Visit
Admission: RE | Admit: 2023-02-18 | Discharge: 2023-02-18 | Disposition: A | Discharge status: Home / Self Care | Payer: Medicare Other | Source: Ambulatory Visit | Attending: Sports Medicine | Admitting: Sports Medicine

## 2023-02-18 DIAGNOSIS — N63 Unspecified lump in unspecified breast: Secondary | ICD-10-CM

## 2023-02-18 DIAGNOSIS — R928 Other abnormal and inconclusive findings on diagnostic imaging of breast: Secondary | ICD-10-CM | POA: Diagnosis not present

## 2023-02-27 ENCOUNTER — Emergency Department (HOSPITAL_BASED_OUTPATIENT_CLINIC_OR_DEPARTMENT_OTHER): Payer: Medicare Other | Admitting: Radiology

## 2023-02-27 ENCOUNTER — Emergency Department (HOSPITAL_BASED_OUTPATIENT_CLINIC_OR_DEPARTMENT_OTHER)
Admission: EM | Admit: 2023-02-27 | Discharge: 2023-02-27 | Disposition: A | Discharge status: Home / Self Care | Payer: Medicare Other

## 2023-02-27 ENCOUNTER — Other Ambulatory Visit: Payer: Self-pay

## 2023-02-27 ENCOUNTER — Encounter (HOSPITAL_BASED_OUTPATIENT_CLINIC_OR_DEPARTMENT_OTHER): Payer: Self-pay

## 2023-02-27 ENCOUNTER — Emergency Department (HOSPITAL_BASED_OUTPATIENT_CLINIC_OR_DEPARTMENT_OTHER): Payer: Medicare Other

## 2023-02-27 DIAGNOSIS — K219 Gastro-esophageal reflux disease without esophagitis: Secondary | ICD-10-CM | POA: Insufficient documentation

## 2023-02-27 DIAGNOSIS — E119 Type 2 diabetes mellitus without complications: Secondary | ICD-10-CM | POA: Diagnosis not present

## 2023-02-27 DIAGNOSIS — S52592A Other fractures of lower end of left radius, initial encounter for closed fracture: Secondary | ICD-10-CM | POA: Diagnosis not present

## 2023-02-27 DIAGNOSIS — Z86711 Personal history of pulmonary embolism: Secondary | ICD-10-CM | POA: Diagnosis not present

## 2023-02-27 DIAGNOSIS — Z7984 Long term (current) use of oral hypoglycemic drugs: Secondary | ICD-10-CM | POA: Diagnosis not present

## 2023-02-27 DIAGNOSIS — Z7901 Long term (current) use of anticoagulants: Secondary | ICD-10-CM | POA: Diagnosis not present

## 2023-02-27 DIAGNOSIS — W01198A Fall on same level from slipping, tripping and stumbling with subsequent striking against other object, initial encounter: Secondary | ICD-10-CM | POA: Insufficient documentation

## 2023-02-27 DIAGNOSIS — Z79899 Other long term (current) drug therapy: Secondary | ICD-10-CM | POA: Diagnosis not present

## 2023-02-27 DIAGNOSIS — M19012 Primary osteoarthritis, left shoulder: Secondary | ICD-10-CM | POA: Diagnosis not present

## 2023-02-27 DIAGNOSIS — Y9301 Activity, walking, marching and hiking: Secondary | ICD-10-CM | POA: Insufficient documentation

## 2023-02-27 DIAGNOSIS — Z7982 Long term (current) use of aspirin: Secondary | ICD-10-CM | POA: Insufficient documentation

## 2023-02-27 DIAGNOSIS — I4891 Unspecified atrial fibrillation: Secondary | ICD-10-CM | POA: Insufficient documentation

## 2023-02-27 DIAGNOSIS — M25512 Pain in left shoulder: Secondary | ICD-10-CM | POA: Diagnosis not present

## 2023-02-27 DIAGNOSIS — I252 Old myocardial infarction: Secondary | ICD-10-CM | POA: Insufficient documentation

## 2023-02-27 DIAGNOSIS — I672 Cerebral atherosclerosis: Secondary | ICD-10-CM | POA: Diagnosis not present

## 2023-02-27 DIAGNOSIS — S52352A Displaced comminuted fracture of shaft of radius, left arm, initial encounter for closed fracture: Secondary | ICD-10-CM | POA: Insufficient documentation

## 2023-02-27 DIAGNOSIS — Z96651 Presence of right artificial knee joint: Secondary | ICD-10-CM | POA: Diagnosis not present

## 2023-02-27 DIAGNOSIS — M79602 Pain in left arm: Secondary | ICD-10-CM | POA: Diagnosis present

## 2023-02-27 DIAGNOSIS — M25561 Pain in right knee: Secondary | ICD-10-CM | POA: Diagnosis not present

## 2023-02-27 DIAGNOSIS — R936 Abnormal findings on diagnostic imaging of limbs: Secondary | ICD-10-CM | POA: Diagnosis not present

## 2023-02-27 DIAGNOSIS — S0990XA Unspecified injury of head, initial encounter: Secondary | ICD-10-CM | POA: Diagnosis not present

## 2023-02-27 DIAGNOSIS — M1812 Unilateral primary osteoarthritis of first carpometacarpal joint, left hand: Secondary | ICD-10-CM | POA: Insufficient documentation

## 2023-02-27 DIAGNOSIS — S52502A Unspecified fracture of the lower end of left radius, initial encounter for closed fracture: Secondary | ICD-10-CM | POA: Diagnosis not present

## 2023-02-27 DIAGNOSIS — W19XXXA Unspecified fall, initial encounter: Secondary | ICD-10-CM

## 2023-02-27 MED ORDER — OXYCODONE HCL 5 MG PO TABS
2.5000 mg | ORAL_TABLET | Freq: Four times a day (QID) | ORAL | 0 refills | Status: AC | PRN
Start: 2023-02-27 — End: ?

## 2023-02-27 NOTE — Discharge Instructions (Signed)
As discussed, x-ray of your left wrist did show evidence of fracture.  We have placed you in a splint as well as given you a sling to wear.  You may ice here to help with swelling and pain.  Take Tylenol for pain.  Recommend calling number attached to discharge papers to schedule appointment with orthopedics for reevaluation.  Please do not hesitate to return if the worrisome signs and symptoms we discussed become apparent.

## 2023-02-27 NOTE — ED Triage Notes (Signed)
Pt c/o fall 2 days ago, advises that she "tripped & fell, foot got caught on a vine & I went down face first. Landed on L forearm, bounced my head off the ground."   Hx PE cor pulmonale, on eliquis 5mg  once daily.   Pt A&O4, GCS 15 during triage

## 2023-02-27 NOTE — ED Provider Notes (Signed)
[place] EMERGENCY DEPARTMENT AT [facility] Provider Note   CSN: [id number] Arrival date & time: 02/27/23  1403     History  Chief Complaint  Patient presents with   Fall    2 days ago, on eliquis    [name] is a 72 y.o. female.   Fall   72 year old female presents to the emergency department with complaints of fall.  States that fall occurred 2 days ago.  States that she was walking down a hill trying to cut her garbage when she tripped on a vine falling forwards.  Reports landing majority on her left side.  Does states that that she hit her head on the dirt.  States that she does not have a headache after the fall so was not seen by provider initially after the fall.  Patient is on Eliquis secondary to history of PE.  Currently complaining of left arm pain, right knee pain.  Denies any chest pain, shortness of breath, abdominal pain, nausea, vomiting.  Denies any visual disturbance, gait abnormality, slurred speech, facial droop, weakness/sensory deficits in upper extremities.  Past medical history significant for PE on Eliquis, atrial fibrillation, MI, degenerative disc disease, GERD, diabetes mellitus type 2,  Home Medications Prior to Admission medications   Medication Sig Start Date End Date Taking? Authorizing Provider  aspirin EC 81 MG tablet Take 81 mg by mouth daily. Swallow whole.    [provider]  Cholecalciferol (VITAMIN D) 50 MCG (2000 UT) tablet Take 2,000 Units by mouth daily.    [provider]  [facility] VITAMIN B12 1000 MCG tablet TAKE 1 TABLET BY MOUTH EVERY DAY 06/05/21   Monica Becton, MD  diphenoxylate-atropine (LOMOTIL) 2.5-0.025 MG tablet TAKE 1 TABLET BY MOUTH 4 (FOUR) TIMES DAILY AS NEEDED FOR DIARRHEA OR LOOSE STOOLS. 02/09/23   Monica Becton, MD  ELIQUIS 5 MG TABS tablet TAKE 1 TABLET BY MOUTH TWICE A DAY 10/14/22   Monica Becton, MD  ELIQUIS 5 MG TABS tablet TAKE 1 TABLET BY MOUTH TWICE A DAY  02/09/23   Monica Becton, MD  HYDROcodone-acetaminophen (NORCO/VICODIN) 5-325 MG tablet Take 1 tablet by mouth 2 (two) times daily as needed for moderate pain. 08/15/22   Agapito Games, MD  ipratropium (ATROVENT) 0.06 % nasal spray Place 2 sprays into both nostrils 4 (four) times daily. Patient taking differently: Place 2 sprays into both nostrils 4 (four) times daily. As needed 01/14/21   Monica Becton, MD  metFORMIN (GLUCOPHAGE-XR) 750 MG 24 hr tablet Take 1 tablet (750 mg total) by mouth daily with breakfast. 04/01/22   Monica Becton, MD  Multiple Vitamin (MULTIVITAMIN WITH MINERALS) TABS tablet Take 1 tablet by mouth daily.    [provider]      Allergies    Ciprofloxacin, Doxycycline, Metoprolol, Strawberry extract, and Tetracyclines & related    Review of Systems   Review of Systems  All other systems reviewed and are negative.   Physical Exam Updated Vital Signs BP (!) 148/91 (BP Location: Right Arm)   Pulse (!) 101   Temp 98.7 F (37.1 C)   Resp 18   SpO2 96%  Physical Exam Vitals and nursing note reviewed.  Constitutional:      General: She is not in acute distress.    Appearance: She is well-developed.  HENT:     Head: Normocephalic and atraumatic.  Eyes:     Conjunctiva/sclera: Conjunctivae normal.  Cardiovascular:     Rate  and Rhythm: Normal rate and regular rhythm.  Pulmonary:     Effort: Pulmonary effort is normal. No respiratory distress.     Breath sounds: Normal breath sounds.  Abdominal:     Palpations: Abdomen is soft.     Tenderness: There is no abdominal tenderness.  Musculoskeletal:        General: No swelling.     Cervical back: Neck supple.     Comments: No midline tenderness of cervical, thoracic, lumbar spine without step-off or deformity noted.  Patient with tenderness to her tuberosity on right side as well as some overlying ecchymosis without tenderness of lower extremities otherwise.  Full range of  motion bilateral hips, knees, ankles, digits.  No chest wall tenderness to palpation.  Patient with tenderness of left proximal humerus, left elbow diffusely as well as left wrist diffusely.  Radial pulses 2+ bilaterally.  Limited range of motion of left shoulder/wrist secondary to pain.  Skin:    General: Skin is warm and dry.     Capillary Refill: Capillary refill takes less than 2 seconds.  Neurological:     Mental Status: She is alert.     Comments: Alert and oriented to self, place, time and event.   Speech is fluent, clear without dysarthria or dysphasia.   Strength 5/5 in upper/lower extremities   Sensation intact in upper/lower extremities   Able to ambulate independently  CN I not tested  CN II not tested CN III, IV, VI PERRLA and EOMs intact bilaterally  CN V Intact sensation to sharp and light touch to the face  CN VII facial movements symmetric  CN VIII not tested  CN IX, X no uvula deviation, symmetric rise of soft palate  CN XI 5/5 SCM and trapezius strength bilaterally  CN XII Midline tongue protrusion, symmetric L/R movements     Psychiatric:        Mood and Affect: Mood normal.     ED Results / Procedures / Treatments   Labs (all labs ordered are listed, but only abnormal results are displayed) Labs Reviewed - No data to display  EKG None  Radiology DG Hand Complete Left  Result Date: 02/27/2023 CLINICAL DATA:  Pain after fall. EXAM: LEFT HAND - COMPLETE 3+ VIEW COMPARISON:  None Available. FINDINGS: Mildly comminuted and displaced distal radius fracture, extending into the radial styloid and radiocarpal joint. No ulna styloid fracture. Tiny ossific density adjacent to the dorsal proximal carpal row may represent a tiny triquetral fracture, of unknown acuity. There is moderate osteoarthritis of the thumb carpal metacarpal joint. Generalized soft tissue edema. IMPRESSION: 1. Mildly comminuted and displaced distal radius fracture, extending into the radial  styloid and radiocarpal joint. 2. Tiny ossific density adjacent to the dorsal proximal carpal row may represent a tiny triquetral fracture, of unknown acuity. 3. 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 Electronically Signed   By: Narda Rutherford M.D.   On: 02/27/2023 16:43   DG Knee Complete 4 Views Right  Result Date: 02/27/2023 CLINICAL DATA:  Pain after fall. EXAM: RIGHT KNEE - COMPLETE 4+ VIEW COMPARISON:  None Available. FINDINGS: Knee arthroplasty in expected alignment. No acute or periprosthetic fracture. No periprosthetic lucency. No knee joint effusion. No focal soft tissue abnormalities. IMPRESSION: Knee arthroplasty without complication or acute fracture. Electronically Signed   By: Narda Rutherford M.D.   On: 02/27/2023 16:42   DG Shoulder Left  Result Date: 02/27/2023 CLINICAL DATA:  Pain, fall 2 days ago EXAM: LEFT SHOULDER - 2+  VIEW COMPARISON:  None Available. FINDINGS: There is no evidence of fracture or dislocation. Mild acromioclavicular degenerative change. Subcortical cystic change in the lateral humeral head. Soft tissues are unremarkable. No displaced rib fracture. IMPRESSION: 1. No fracture or dislocation of the left shoulder. 2. Mild acromioclavicular degenerative change. Electronically Signed   By: Narda Rutherford M.D.   On: 02/27/2023 16:41   DG Forearm Left  Result Date: 02/27/2023 CLINICAL DATA:  Pain after fall. EXAM: LEFT FOREARM - 2 VIEW COMPARISON:  None Available. FINDINGS: Comminuted and mildly displaced distal radius fracture, extending into the radial styloid. No definite associated ulnar styloid or ulnar fracture. The proximal forearm is intact. Elbow alignment is maintained. Soft tissue edema is seen at the fracture site IMPRESSION: Comminuted and mildly displaced distal radius fracture extending into the radial styloid. Electronically Signed   By: Narda Rutherford M.D.   On: 02/27/2023 16:33    Procedures Procedures    Medications  Ordered in ED Medications - No data to display  ED Course/ Medical Decision Making/ A&P                                 Medical Decision Making Amount and/or Complexity of Data Reviewed Radiology: ordered.   This patient presents to the ED for concern of fall, this involves an extensive number of treatment options, and is a complaint that carries with it a high risk of complications and morbidity.  The differential diagnosis includes CVA, fracture, strain/pain, dislocation, ligamentous/tendinous injury, neurovascular, mice, pneumothorax, spinal cord injury, solid organ damage, other   Co morbidities that complicate the patient evaluation  See HPI   Additional history obtained:  Additional history obtained from EMR External records from outside source obtained and reviewed including hospital records   Lab Tests:  N/a   Imaging Studies ordered:  I ordered imaging studies including CT head, left hand x-ray, left forearm x-ray, left shoulder x-ray, right knee x-ray I independently visualized and interpreted imaging which showed  CT head: No acute intracranial abnormality Left shoulder x-ray: No acute fracture or dislocation.  AC joint degeneration Left forearm x-ray: Comminuted mildly displaced distal radius fracture extending into radial styloid. Left hand x-ray: Comminuted and displaced distal radius fracture.  20 ossific density adjacent to dorsal proximal carpal row.  Moderate osteoarthritis of thumb carpometacarpal joint. Right knee x-ray: No acute osseous abnormality.  Prior total knee in place I agree with the radiologist interpretation  Cardiac Monitoring: / EKG:  The patient was maintained on a cardiac monitor.  I personally viewed and interpreted the cardiac monitored which showed an underlying rhythm of: Sinus rhythm   Consultations Obtained:  N/a   Problem List / ED Course / Critical interventions / Medication management  Fall, wrist  fracture Reevaluation of the patient showed that the patient stayed the same I have reviewed the patients home medicines and have made adjustments as needed   Social Determinants of Health:  Denies tobacco, illicit drug use   Test / Admission - Considered:  Fall, wrist fracture  vitals signs significant for hypertension blood pressure are 145/70. Otherwise within normal range and stable throughout visit. Imaging studies significant for: See above 72 year old female presents emergency department after mechanical fall.  Fall occurred 2 days ago when she tripped carrying out the garbage.  Patient with trauma to head on Eliquis but without LOC.  Regarding head injury, patient with nonfocal neurologic exam.  CT imaging of  head was obtained given fall on thinners with head injury which is negative for any acute intracranial abnormality.  Other areas of appreciable traumatic injury with reproducible tenderness were x-rayed which did not show evidence of left distal radius fracture with questionable triquetrium fracture of unknown age.  Regarding left distal radius fracture, patient placed in sugar-tong as well as splint and will recommend close follow-up with orthopedist in the outpatient setting.  Symptomatic therapy recommended as described in AVS.  Treatment plan discussed at length with patient and she acknowledged understand was agreeable to said plan.  Patient overall well-appearing, afebrile no acute distress. Worrisome signs and symptoms were discussed with the patient, and the patient acknowledged understanding to return to the ED if noticed. Patient was stable upon discharge.          Final Clinical Impression(s) / ED Diagnoses Final diagnoses:  None    Rx / DC Orders ED Discharge Orders     None         Peter Garter, [place] 02/27/23 1754    Coral Spikes, DO 02/27/23 2313

## 2023-03-02 ENCOUNTER — Telehealth: Payer: Self-pay | Admitting: Orthopaedic Surgery

## 2023-03-02 NOTE — Telephone Encounter (Signed)
Pt called stated she was seen by [facility] for fractured left radius. Py husband has an apt for 11/13 with [facility] and pt states they take transportation and would really like an appt with Dr Magnus Ivan if possible around the same time. Please call pt at [phone].

## 2023-03-04 ENCOUNTER — Ambulatory Visit (INDEPENDENT_AMBULATORY_CARE_PROVIDER_SITE_OTHER): Payer: Medicare Other | Admitting: Physician Assistant

## 2023-03-04 ENCOUNTER — Other Ambulatory Visit (INDEPENDENT_AMBULATORY_CARE_PROVIDER_SITE_OTHER): Payer: Medicare Other

## 2023-03-04 ENCOUNTER — Encounter: Payer: Self-pay | Admitting: Physician Assistant

## 2023-03-04 DIAGNOSIS — M25532 Pain in left wrist: Secondary | ICD-10-CM

## 2023-03-04 DIAGNOSIS — S52502A Unspecified fracture of the lower end of left radius, initial encounter for closed fracture: Secondary | ICD-10-CM

## 2023-03-04 NOTE — Progress Notes (Signed)
Office Visit Note   Patient: [name]           Date of Birth: [birth year]           MRN: [id number] Visit Date: 03/04/2023              Requested by: Monica Becton, MD [address] [place] [address] [place],  [place] [address] PCP: Monica Becton, MD   Assessment & Plan: Visit Diagnoses:  1. Pain in left wrist     Plan: Patient is a 72 year old right-hand-dominant woman who is a patient of Dr. Eliberto Ivory.  She is 1 week status post injury to her left wrist which she fell.  She is on Eliquis.  She went to the emergency room where all other injuries were cleared.  She was placed in a wrist splint for a minimally displaced distal radius fracture.  This the splint was quite tight and she did take it off.  Review x-rays today with Dr. Magnus Ivan.  She would go into a short arm cast.  Will follow-up in 3 weeks cast should be removed new x-rays taken.  At that point she will be 4 weeks post injury perhaps can go into a removable splint  Follow-Up Instructions:    Orders:  Orders Placed This Encounter  Procedures   XR Wrist 2 Views Left   No orders of the defined types were placed in this encounter.     Procedures: No procedures performed   Clinical Data: No additional findings.   Subjective: Chief Complaint  Patient presents with   Left Wrist - Pain, Fracture    HPI patient is a 72 year old woman who is now 10 days status post a fall on her left side.  She went to the ER 2 days later and an x-ray demonstrated a distal radius fracture.  She was placed in a splint but discontinue the splint because she thought it was too tight.  She is on Eliquis no recent history of pain in the past.  Review of Systems  All other systems reviewed and are negative.    Objective: Vital Signs: There were no vitals taken for this visit.  Physical Exam Constitutional:      Appearance: Normal appearance.  Pulmonary:     Effort: Pulmonary effort is normal.  Skin:     General: Skin is warm and dry.  Neurological:     General: No focal deficit present.     Mental Status: She is alert and oriented to person, place, and time.  Psychiatric:        Mood and Affect: Mood normal.        Behavior: Behavior normal.     Ortho Exam Examination of her left wrist she has good strong radial pulse she does have some swelling in her fingers brisk capillary refill less than 2 seconds she is tender over the distal radius Specialty Comments:  No specialty comments available.  Imaging: No results found.   PMFS History: Patient Active Problem List   Diagnosis Date Noted   Pain and swelling of right lower extremity 07/07/2022   Vertigo 06/09/2022   Chronic right shoulder pain 04/28/2022   Urinary frequency 03/31/2022   Benign essential hypertension 03/31/2022   Abdominal pain 08/20/2021   Arthritis of [facility]) joint of right thumb 04/25/2021   Sinusitis 01/14/2021   Annual physical exam 11/13/2020   Panniculitis 11/13/2020   Macromastia 11/13/2020   Right ovarian cyst 07/27/2020   Status post total  replacement of left hip 02/17/2020   Unilateral primary osteoarthritis, left hip 02/16/2020   Diarrhea    B12 deficiency 01/08/2018   History of pulmonary embolism 01/08/2018   Status post total left knee replacement 10/13/2017   Family history of thyroid disease 08/25/2017   Chronic pain of left knee 05/20/2017   Unilateral primary osteoarthritis, left knee 05/20/2017   Morbid obesity (HCC) 04/30/2017   Family history of ovarian cancer 02/17/2017   IBS (irritable bowel syndrome) 01/21/2017   Dyspnea 10/08/2016   Paroxysmal atrial fibrillation (HCC) 06/18/2016   Type 2 diabetes mellitus without complication, without long-term current use of insulin (HCC) 01/21/2016   H/O Spinal surgery 12/21/2015   Barrett's esophagus 12/21/2015   Hx of adenomatous polyp of colon 03/01/2002   Past Medical History:  Diagnosis Date   Abnormal thyroid function  test    Allergy    Anemia    she attributes previously to fibroids, she declines   Barrett's esophagus    Blood transfusion without reported diagnosis    DDD (degenerative disc disease), cervical    DJD (degenerative joint disease)    Dysrhythmia    GERD (gastroesophageal reflux disease)    Heart murmur    History of bronchitis    History of hiatal hernia    Hx of adenomatous polyp of colon 03/01/2002   Myocardial infarction (HCC)    OA (osteoarthritis)    Obese    Paroxysmal atrial fibrillation (HCC)    PE (pulmonary embolism) 12/21/2015   Plantar fasciitis    Pneumonia    PONV (postoperative nausea and vomiting)    pt. reports that she woke up during surgery   Pre-diabetes    Pulmonary embolism (HCC)     Family History  Problem Relation Age of Onset   Stroke Maternal Grandmother    Bladder Cancer Maternal Grandmother 44   Esophageal cancer Maternal Grandmother 69   Hyperthyroidism Mother        Radioactive Iodine Treatment   Hypothyroidism Mother    Cervical cancer Maternal Aunt 51   Ovarian cancer Daughter 27   Colon cancer Neg Hx    Pancreatic cancer Neg Hx    Rectal cancer Neg Hx    Stomach cancer Neg Hx    Diabetes Neg Hx     Past Surgical History:  Procedure Laterality Date   ABDOMINAL HYSTERECTOMY     Arthroscopic surgery left knee     BACK SURGERY     2005 ,  2017   BIOPSY  07/26/2019   Procedure: BIOPSY;  Surgeon: Iva Boop, MD;  Location: [facility];  Service: Endoscopy;;   CESAREAN SECTION     CHOLECYSTECTOMY     COLONOSCOPY  2003, 2011   3 mm adenoma 2011   COLONOSCOPY WITH PROPOFOL N/A 07/26/2019   Procedure: COLONOSCOPY WITH PROPOFOL;  Surgeon: Iva Boop, MD;  Location: [facility];  Service: Endoscopy;  Laterality: N/A;   ESOPHAGOGASTRODUODENOSCOPY  multiple since 2003   ESOPHAGOGASTRODUODENOSCOPY (EGD) WITH PROPOFOL N/A 07/26/2019   Procedure: ESOPHAGOGASTRODUODENOSCOPY (EGD) WITH PROPOFOL;  Surgeon: Iva Boop, MD;  Location:  [facility];  Service: Endoscopy;  Laterality: N/A;   IR GENERIC HISTORICAL  12/19/2015   IR ANGIOGRAM SELECTIVE EACH ADDITIONAL VESSEL 12/19/2015 [name], MD [facility]   IR GENERIC HISTORICAL  12/19/2015   IR ANGIOGRAM PULMONARY BILATERAL SELECTIVE 12/19/2015 [name], MD [facility]   IR GENERIC HISTORICAL  12/19/2015   IR INFUSION THROMBOL ARTERIAL INITIAL (MS) 12/19/2015 [name], MD  [facility]   IR GENERIC HISTORICAL  12/19/2015   IR US GUIDE VASC ACCESS RIGHT 12/19/2015 [name], MD [facility]   IR GENERIC HISTORICAL  12/19/2015   IR [facility] SELECTIVE EACH ADDITIONAL VESSEL 12/19/2015 [name], MD [facility]   IR GENERIC HISTORICAL  12/20/2015   IR THROMB F/U EVAL ART/VEN FINAL DAY (MS) 12/20/2015 Gilmer Mor, DO [facility]   IR GENERIC HISTORICAL  12/19/2015   IR INFUSION THROMBOL ARTERIAL INITIAL (MS) 12/19/2015 [name], MD [facility]   IR RADIOLOGIST EVAL & MGMT  09/19/2020   PLANTAR FASCIA RELEASE     right   POLYPECTOMY  07/26/2019   Procedure: POLYPECTOMY;  Surgeon: Iva Boop, MD;  Location: [facility];  Service: Endoscopy;;   Right knee replacement     SPINAL FUSION  10/31/2015   revision at [facility]    TONSILLECTOMY     TOTAL HIP ARTHROPLASTY Left 02/17/2020   Procedure: LEFT TOTAL HIP ARTHROPLASTY ANTERIOR APPROACH;  Surgeon: Kathryne Hitch, MD;  Location: [facility];  Service: Orthopedics;  Laterality: Left;   TOTAL KNEE ARTHROPLASTY Left 10/13/2017   Procedure: LEFT TOTAL KNEE ARTHROPLASTY;  Surgeon: Kathryne Hitch, MD;  Location: [facility];  Service: Orthopedics;  Laterality: Left;   ULNAR TUNNEL RELEASE     right   VENOUS ABLATION     x 2   Social History   Occupational History    Comment: Architect   Occupation: Retired.  Tobacco Use   Smoking status: Never   Smokeless tobacco: Never  Vaping Use   Vaping status: Never Used  Substance and Sexual Activity   Alcohol use: No   Drug  use: No   Sexual activity: Not Currently    Partners: Male    Birth control/protection: Surgical    Comment: Hysterectomy

## 2023-03-13 ENCOUNTER — Telehealth: Payer: Self-pay

## 2023-03-13 NOTE — Telephone Encounter (Signed)
Transition Care Management Unsuccessful Follow-up Telephone Call  Date of discharge and from where:  02/27/2023 [facility]  Attempts:  1st Attempt  Reason for unsuccessful TCM follow-up call:  Left voice message  Jeriah Corkum [facility]  [facility], [facility] Guide Direct Dial: [phone]  Website: [email or web address]

## 2023-03-16 ENCOUNTER — Telehealth: Payer: Self-pay

## 2023-03-16 ENCOUNTER — Encounter: Payer: Self-pay | Admitting: Family Medicine

## 2023-03-16 ENCOUNTER — Ambulatory Visit (INDEPENDENT_AMBULATORY_CARE_PROVIDER_SITE_OTHER): Payer: Medicare Other | Admitting: Family Medicine

## 2023-03-16 VITALS — Ht 62.0 in | Wt 254.0 lb

## 2023-03-16 DIAGNOSIS — Z0000 Encounter for general adult medical examination without abnormal findings: Secondary | ICD-10-CM | POA: Diagnosis not present

## 2023-03-16 NOTE — Progress Notes (Signed)
Subjective:   [name] is a 72 y.o. female who presents for an Initial Medicare Annual Wellness Visit.  Visit Complete: Virtual I connected with  [name] on 03/16/23 by a audio enabled telemedicine application and verified that I am speaking with the correct person using two identifiers.  Patient Location: Home  Provider Location: Office/Clinic  I discussed the limitations of evaluation and management by telemedicine. The patient expressed understanding and agreed to proceed.  Vital Signs: Because this visit was a virtual/telehealth visit, some criteria may be missing or patient reported. Any vitals not documented were not able to be obtained and vitals that have been documented are patient reported.  Patient Medicare AWV questionnaire was completed by the patient on 03/16/23; I have confirmed that all information answered by patient is correct and no changes since this date.  Cardiac Risk Factors include: advanced age (>42men, >39 women);diabetes mellitus;obesity (BMI >30kg/m2)     Objective:    Today's Vitals   03/16/23 1304  Weight: 254 lb (115.2 kg)  Height: 5\' 2"  (1.575 m)  PainSc: 8    Body mass index is 46.46 kg/m.     03/16/2023    1:19 PM 06/12/2022    2:45 PM 05/14/2022    1:25 PM 03/10/2022   10:28 AM 08/05/2021    2:40 PM 11/13/2020    4:05 PM 10/24/2020   10:52 AM  Advanced Directives  Does Patient Have a Medical Advance Directive? No No No No No No No  Does patient want to make changes to medical advance directive? No - Patient declined        Would patient like information on creating a medical advance directive?  No - Patient declined No - Patient declined No - Patient declined No - Patient declined Yes (MAU/Ambulatory/Procedural Areas - Information given) No - Patient declined    Current Medications (verified) Outpatient Encounter Medications as of 03/16/2023  Medication Sig   Cholecalciferol (VITAMIN D) 50 MCG (2000 UT) tablet Take  2,000 Units by mouth daily.   [facility] VITAMIN B12 1000 MCG tablet TAKE 1 TABLET BY MOUTH EVERY DAY   diphenoxylate-atropine (LOMOTIL) 2.5-0.025 MG tablet TAKE 1 TABLET BY MOUTH 4 (FOUR) TIMES DAILY AS NEEDED FOR DIARRHEA OR LOOSE STOOLS.   ipratropium (ATROVENT) 0.06 % nasal spray Place 2 sprays into both nostrils 4 (four) times daily. (Patient taking differently: Place 2 sprays into both nostrils 4 (four) times daily. As needed)   metFORMIN (GLUCOPHAGE-XR) 750 MG 24 hr tablet Take 1 tablet (750 mg total) by mouth daily with breakfast.   Multiple Vitamin (MULTIVITAMIN WITH MINERALS) TABS tablet Take 1 tablet by mouth daily.   aspirin EC 81 MG tablet Take 81 mg by mouth daily. Swallow whole. (Patient not taking: Reported on 03/16/2023)   ELIQUIS 5 MG TABS tablet TAKE 1 TABLET BY MOUTH TWICE A DAY (Patient not taking: Reported on 03/16/2023)   ELIQUIS 5 MG TABS tablet TAKE 1 TABLET BY MOUTH TWICE A DAY (Patient not taking: Reported on 03/16/2023)   oxyCODONE (ROXICODONE) 5 MG immediate release tablet Take 0.5 tablets (2.5 mg total) by mouth every 6 (six) hours as needed for severe pain (pain score 7-10). (Patient not taking: Reported on 03/16/2023)   No facility-administered encounter medications on file as of 03/16/2023.    Allergies (verified) Ciprofloxacin, Doxycycline, Metoprolol, Strawberry extract, and Tetracyclines & related   History: Past Medical History:  Diagnosis Date   Abnormal thyroid function test    Allergy  Anemia    she attributes previously to fibroids, she declines   Barrett's esophagus    Blood transfusion without reported diagnosis    DDD (degenerative disc disease), cervical    DJD (degenerative joint disease)    Dysrhythmia    GERD (gastroesophageal reflux disease)    Heart murmur    History of bronchitis    History of hiatal hernia    Hx of adenomatous polyp of colon 03/01/2002   Myocardial infarction (HCC)    OA (osteoarthritis)    Obese    Paroxysmal atrial  fibrillation (HCC)    PE (pulmonary embolism) 12/21/2015   Plantar fasciitis    Pneumonia    PONV (postoperative nausea and vomiting)    pt. reports that she woke up during surgery   Pre-diabetes    Pulmonary embolism (HCC)    Wrist fracture    Past Surgical History:  Procedure Laterality Date   ABDOMINAL HYSTERECTOMY     Arthroscopic surgery left knee     BACK SURGERY     2005 ,  2017   BIOPSY  07/26/2019   Procedure: BIOPSY;  Surgeon: Iva Boop, MD;  Location: [facility];  Service: Endoscopy;;   CESAREAN SECTION     CHOLECYSTECTOMY     COLONOSCOPY  2003, 2011   3 mm adenoma 2011   COLONOSCOPY WITH PROPOFOL N/A 07/26/2019   Procedure: COLONOSCOPY WITH PROPOFOL;  Surgeon: Iva Boop, MD;  Location: [facility];  Service: Endoscopy;  Laterality: N/A;   ESOPHAGOGASTRODUODENOSCOPY  multiple since 2003   ESOPHAGOGASTRODUODENOSCOPY (EGD) WITH PROPOFOL N/A 07/26/2019   Procedure: ESOPHAGOGASTRODUODENOSCOPY (EGD) WITH PROPOFOL;  Surgeon: Iva Boop, MD;  Location: [facility];  Service: Endoscopy;  Laterality: N/A;   IR GENERIC HISTORICAL  12/19/2015   IR ANGIOGRAM SELECTIVE EACH ADDITIONAL VESSEL 12/19/2015 [name], MD [facility]   IR GENERIC HISTORICAL  12/19/2015   IR ANGIOGRAM PULMONARY BILATERAL SELECTIVE 12/19/2015 [name], MD [facility]   IR GENERIC HISTORICAL  12/19/2015   IR INFUSION THROMBOL ARTERIAL INITIAL (MS) 12/19/2015 [name], MD [facility]   IR GENERIC HISTORICAL  12/19/2015   IR US GUIDE VASC ACCESS RIGHT 12/19/2015 [name], MD [facility]   IR GENERIC HISTORICAL  12/19/2015   IR [facility] SELECTIVE EACH ADDITIONAL VESSEL 12/19/2015 [name], MD [facility]   IR GENERIC HISTORICAL  12/20/2015   IR THROMB F/U EVAL ART/VEN FINAL DAY (MS) 12/20/2015 Gilmer Mor, DO [facility]   IR GENERIC HISTORICAL  12/19/2015   IR INFUSION THROMBOL ARTERIAL INITIAL (MS) 12/19/2015 [name], MD [facility]   IR  RADIOLOGIST EVAL & MGMT  09/19/2020   PLANTAR FASCIA RELEASE     right   POLYPECTOMY  07/26/2019   Procedure: POLYPECTOMY;  Surgeon: Iva Boop, MD;  Location: [facility];  Service: Endoscopy;;   PULMONARY EMBOLISM SURGERY     Right knee replacement     SPINAL FUSION  10/31/2015   revision at [facility]    TONSILLECTOMY     TOTAL HIP ARTHROPLASTY Left 02/17/2020   Procedure: LEFT TOTAL HIP ARTHROPLASTY ANTERIOR APPROACH;  Surgeon: Kathryne Hitch, MD;  Location: [facility];  Service: Orthopedics;  Laterality: Left;   TOTAL KNEE ARTHROPLASTY Left 10/13/2017   Procedure: LEFT TOTAL KNEE ARTHROPLASTY;  Surgeon: Kathryne Hitch, MD;  Location: [facility];  Service: Orthopedics;  Laterality: Left;   ULNAR TUNNEL RELEASE     right   VENOUS ABLATION     x 2  Family History  Problem Relation Age of Onset   Stroke Maternal Grandmother    Bladder Cancer Maternal Grandmother 45   Esophageal cancer Maternal Grandmother 44   Hyperthyroidism Mother        Radioactive Iodine Treatment   Hypothyroidism Mother    Cervical cancer Maternal Aunt 56   Ovarian cancer Daughter 55   Colon cancer Neg Hx    Pancreatic cancer Neg Hx    Rectal cancer Neg Hx    Stomach cancer Neg Hx    Diabetes Neg Hx    Social History   Socioeconomic History   Marital status: Married    Spouse name: Greggory Stallion   Number of children: 3   Years of education: 14   Highest education level: Associate degree: academic program  Occupational History    Comment: Architect   Occupation: Retired.  Tobacco Use   Smoking status: Never   Smokeless tobacco: Never  Vaping Use   Vaping status: Never Used  Substance and Sexual Activity   Alcohol use: No   Drug use: No   Sexual activity: Not Currently    Partners: Male    Birth control/protection: Surgical    Comment: Hysterectomy  Other Topics Concern   Not on file  Social History Narrative   Lives with her husband. Her mom lives next door.  Enjoys reading, sewing, spending time with her grandchildren.   Social Determinants of Health   Financial Resource Strain: Low Risk  (03/16/2023)   Overall Financial Resource Strain (CARDIA)    Difficulty of Paying Living Expenses: Not hard at all  Food Insecurity: No Food Insecurity (03/16/2023)   Hunger Vital Sign    Worried About Running Out of Food in the Last Year: Never true    Ran Out of Food in the Last Year: Never true  Transportation Needs: No Transportation Needs (03/16/2023)   PRAPARE - Administrator, Civil Service (Medical): No    Lack of Transportation (Non-Medical): No  Physical Activity: Sufficiently Active (03/16/2023)   Exercise Vital Sign    Days of Exercise per Week: 7 days    Minutes of Exercise per Session: 120 min  Stress: Stress Concern Present (03/16/2023)   [employer] of Occupational Health - Occupational Stress Questionnaire    Feeling of Stress : To some extent  Social Connections: Moderately Isolated (03/10/2022)   Social Connection and Isolation Panel [NHANES]    Frequency of Communication with Friends and Family: More than three times a week    Frequency of Social Gatherings with Friends and Family: More than three times a week    Attends Religious Services: Never    Database administrator or Organizations: No    Attends Engineer, structural: Never    Marital Status: Married    Tobacco Counseling Counseling given: Not Answered   Clinical Intake:  Pre-visit preparation completed: No  Pain : 0-10 Pain Score: 8  Pain Type: Acute pain Pain Location: Wrist Pain Orientation: Left (acute fracture-- 2 weeks ago) Pain Radiating Towards: none Pain Descriptors / Indicators: Sharp, Constant Pain Onset: 1 to 4 weeks ago (Nov 6) Pain Frequency: Constant Pain Relieving Factors: elevating Effect of Pain on Daily Activities: acute wrist fracture limiting activites.  Pain Relieving Factors: elevating  BMI - recorded:  46.5 Nutritional Status: BMI > 30  Obese Nutritional Risks: None Diabetes: Yes CBG done?: Yes (fasting 116-120) CBG resulted in Enter/ Edit results?: No Did pt. bring in CBG monitor from home?: No  How often do you need to have someone help you when you read instructions, pamphlets, or other written materials from your doctor or pharmacy?: 1 - Never What is the last grade level you completed in school?: 14  Interpreter Needed?: No      Activities of Daily Living    03/16/2023    1:09 PM  In your present state of health, do you have any difficulty performing the following activities:  Hearing? 0  Vision? 0  Difficulty concentrating or making decisions? 0  Walking or climbing stairs? 1  Comment knee replacement- goes show up stairs  Dressing or bathing? 1  Comment due to fractured wrist- can do it takes longer  Doing errands, shopping? 0  Preparing Food and eating ? Y  Comment has IBS and makes eating difficullt.  Using the Toilet? N  In the past six months, have you accidently leaked urine? N  Do you have problems with loss of bowel control? N  Managing your Medications? N  Managing your Finances? N  Housekeeping or managing your Housekeeping? N    Patient Care Team: Monica Becton, MD as PCP - General (Family Medicine) Carlus Pavlov, MD as Consulting Physician (Internal Medicine) Iva Boop, MD as Consulting Physician (Gastroenterology) Reva Bores, MD as Consulting Physician (Obstetrics and Gynecology) Hillis Range, MD (Inactive) as Consulting Physician (Cardiology) Roslynn Amble, MD (Pulmonary Disease)-- retired. Delton Coombes, MD pulmonary  Doneen Poisson, MD orthopedic   Indicate any recent Medical Services you may have received from other than [facility] providers in the past year (date may be approximate).     Assessment:   This is a routine wellness examination for Myleigh.  Hearing/Vision screen Hearing Screening - Comments:: Unable to  test, grossly intact.  Vision Screening - Comments:: Unable to test, grossly intact, wears glasses    Goals Addressed             This Visit's Progress    Set My Weight Loss Goal       Weight loss goal: 20 pound increments         Depression Screen    03/16/2023    1:17 PM 02/06/2023    4:14 PM 03/10/2022   10:28 AM 11/13/2020    4:05 PM  PHQ 2/9 Scores  PHQ - 2 Score 0 0 0 4  PHQ- 9 Score    15    Fall Risk    03/16/2023    1:19 PM 02/06/2023    4:14 PM 07/17/2022    3:05 PM 03/10/2022   10:28 AM 11/13/2020    3:39 PM  Fall Risk   Falls in the past year? 1 0 0 0 1  Number falls in past yr: 0 0 0 0 1  Injury with Fall? 1 0 0 0 0  Risk for fall due to : Other (Comment)  No Fall Risks No Fall Risks History of fall(s)  Risk for fall due to: Comment tripped on a vine in yard.      Follow up  Falls evaluation completed Falls evaluation completed Falls evaluation completed Education provided    MEDICARE RISK AT HOME: Medicare Risk at Home Any stairs in or around the home?: Yes If so, are there any without handrails?: Yes Home free of loose throw rugs in walkways, pet beds, electrical cords, etc?: Yes Adequate lighting in your home to reduce risk of falls?: Yes Life alert?: No Use of a cane, walker or w/c?: Yes (for balance at times)  Grab bars in the bathroom?: No Shower chair or bench in shower?: Yes Elevated toilet seat or a handicapped toilet?: Yes  TIMED UP AND GO:  Was the test performed? No    Cognitive Function:        03/16/2023    1:21 PM 03/10/2022   10:36 AM  6CIT Screen  What Year? 0 points 0 points  What month? 0 points 0 points  What time? 0 points 0 points  Count back from 20 0 points 0 points  Months in reverse 0 points 0 points  Repeat phrase 0 points 0 points  Total Score 0 points 0 points    Immunizations Immunization History  Administered Date(s) Administered   Fluad Quad(high Dose 65+) 03/31/2022   Influenza, High Dose  Seasonal PF 02/25/2017, 03/02/2018   Influenza-Unspecified 02/09/2021   Moderna Sars-Covid-2 Vaccination 05/07/2019, 06/18/2019   PNEUMOCOCCAL CONJUGATE-20 04/16/2022   Tdap 08/15/2022    TDAP status: Up to date  Flu Vaccine status: Due, Education has been provided regarding the importance of this vaccine. Advised may receive this vaccine at local pharmacy or Health Dept. Aware to provide a copy of the vaccination record if obtained from local pharmacy or Health Dept. Verbalized acceptance and understanding.  Pneumococcal vaccine status: Up to date  Covid-19 vaccine status: Declined, Education has been provided regarding the importance of this vaccine but patient still declined. Advised may receive this vaccine at local pharmacy or Health Dept.or vaccine clinic. Aware to provide a copy of the vaccination record if obtained from local pharmacy or Health Dept. Verbalized acceptance and understanding.  Qualifies for Shingles Vaccine? Yes   Zostavax completed No   Shingrix Completed?: No.    Education has been provided regarding the importance of this vaccine. Patient has been advised to call insurance company to determine out of pocket expense if they have not yet received this vaccine. Advised may also receive vaccine at local pharmacy or Health Dept. Verbalized acceptance and understanding.  Screening Tests Health Maintenance  Topic Date Due   OPHTHALMOLOGY EXAM  Never done   Hepatitis C Screening  Never done   FOOT EXAM  11/13/2021   INFLUENZA VACCINE  11/20/2022   COVID-19 Vaccine (3 - 2023-24 season) 12/21/2022   Zoster Vaccines- Shingrix (1 of 2) 10/17/2023 (Originally 10/01/2000)   Diabetic kidney evaluation - Urine ACR  03/11/2027 (Originally 11/21/2021)   HEMOGLOBIN A1C  08/07/2023   Diabetic kidney evaluation - eGFR measurement  02/06/2024   Medicare Annual Wellness (AWV)  03/15/2024   MAMMOGRAM  02/17/2025   Colonoscopy  07/25/2029   DTaP/Tdap/Td (2 - Td or Tdap) 08/14/2032    Pneumonia Vaccine 34+ Years old  Completed   DEXA SCAN  Completed   HPV VACCINES  Aged Out    Health Maintenance  Health Maintenance Due  Topic Date Due   OPHTHALMOLOGY EXAM  Never done   Hepatitis C Screening  Never done   FOOT EXAM  11/13/2021   INFLUENZA VACCINE  11/20/2022   COVID-19 Vaccine (3 - 2023-24 season) 12/21/2022    Colorectal cancer screening: Type of screening: Colonoscopy. Completed 07/26/2019. Repeat every 7 years  Mammogram status: Completed 02/18/23. Repeat every year  Bone Density status: Completed 11/20/20. Results reflect: Bone density results: OSTEOPENIA. Repeat every 5 years.  Lung Cancer Screening: (Low Dose CT Chest recommended if Age 60-80 years, 20 pack-year currently smoking OR have quit w/in 15years.) does not qualify.   Lung Cancer Screening Referral: n/a  Additional Screening:  Hepatitis C Screening:  does qualify; Completed never done.   Vision Screening: Recommended annual ophthalmology exams for early detection of glaucoma and other disorders of the eye. Is the patient up to date with their annual eye exam?  No  Who is the provider or what is the name of the office in which the patient attends annual eye exams? [facility] Sheer, needs to schedule  If pt is not established with a provider, would they like to be referred to a provider to establish care? No .   Dental Screening: Recommended annual dental exams for proper oral hygiene  Diabetic Foot Exam: Diabetic Foot Exam: Overdue, Pt has been advised about the importance in completing this exam. Pt is scheduled for diabetic foot exam on schedule with PCP.  Community Resource Referral / Chronic Care Management: CRR required this visit?  No   CCM required this visit?  No     Plan:     I have personally reviewed and noted the following in the patient's chart:   Medical and social history Use of alcohol, tobacco or illicit drugs  Current medications and supplements including opioid  prescriptions. Patient is not currently taking opioid prescriptions. Functional ability and status Nutritional status Physical activity Advanced directives List of other physicians Hospitalizations, surgeries, and ER visits in previous 12 months: ED with wrist fracture on 02/27/23  Vitals Screenings to include cognitive, depression, and falls Referrals and appointments: needs foot exam, hep c screening, vision check  In addition, I have reviewed and discussed with patient certain preventive protocols, quality metrics, and best practice recommendations. A written personalized care plan for preventive services as well as general preventive health recommendations were provided to patient.     Novella Olive, FNP   03/16/2023   After Visit Summary: (MyChart) Due to this being a telephonic visit, the after visit summary with patients personalized plan was offered to patient via MyChart   Follow-up with PCP as scheduled. Needs diabetic foot exam, diabetic vision exam, ask about hepatitis C screening.  Recommend current flu vaccine at local pharmacy.  Unable to take Covid vaccine due to reaction.

## 2023-03-16 NOTE — Telephone Encounter (Signed)
Transition Care Management Follow-up Telephone Call Date of discharge and from where: 02/27/2023 [facility] How have you been since you were released from the hospital? Patient stated she is still in a lot of pain. Any questions or concerns? No  Items Reviewed: Did the pt receive and understand the discharge instructions provided? Yes  Medications obtained and verified? Yes  Other? No  Any new allergies since your discharge? No  Dietary orders reviewed? Yes Do you have support at home? Yes   Follow up appointments reviewed:  PCP Hospital f/u appt confirmed? Yes  Scheduled to see Servando Snare. Clementeen Hoof, FNP on 03/16/2023 @ [facility]. Specialist Hospital f/u appt confirmed? Yes  Scheduled to see [place] Bali Persons, PA on 03/04/2023 @ [facility]. Are transportation arrangements needed? No  If their condition worsens, is the pt aware to call PCP or go to the Emergency Dept.? Yes Was the patient provided with contact information for the PCP's office or ED? Yes Was to pt encouraged to call back with questions or concerns? Yes   Anna Livers [facility]  [facility], [facility] Guide Direct Dial: [phone]  Website: [email or web address]

## 2023-03-25 ENCOUNTER — Ambulatory Visit: Payer: Medicare Other | Admitting: Physician Assistant

## 2023-03-27 ENCOUNTER — Ambulatory Visit: Payer: Medicare Other | Admitting: Physician Assistant

## 2023-03-30 ENCOUNTER — Ambulatory Visit: Payer: Medicare Other | Admitting: Physician Assistant

## 2023-04-02 ENCOUNTER — Ambulatory Visit (INDEPENDENT_AMBULATORY_CARE_PROVIDER_SITE_OTHER): Payer: Medicare Other | Admitting: Physician Assistant

## 2023-04-02 ENCOUNTER — Encounter: Payer: Self-pay | Admitting: Sports Medicine

## 2023-04-02 ENCOUNTER — Encounter: Payer: Self-pay | Admitting: Physician Assistant

## 2023-04-02 ENCOUNTER — Other Ambulatory Visit (INDEPENDENT_AMBULATORY_CARE_PROVIDER_SITE_OTHER): Payer: Medicare Other

## 2023-04-02 DIAGNOSIS — M1812 Unilateral primary osteoarthritis of first carpometacarpal joint, left hand: Secondary | ICD-10-CM | POA: Diagnosis not present

## 2023-04-02 DIAGNOSIS — M25532 Pain in left wrist: Secondary | ICD-10-CM

## 2023-04-02 MED ORDER — LIDOCAINE HCL 1 % IJ SOLN
0.5000 mL | INTRAMUSCULAR | Status: AC | PRN
  Administered 2023-04-02: .5 mL

## 2023-04-02 NOTE — Progress Notes (Signed)
Office Visit Note   Patient: [name]           Date of Birth: 1950-09-25           MRN: [id number] Visit Date: 04/02/2023              Requested by: Monica Becton, MD [address] [place],  [place] [address] PCP: Monica Becton, MD  Chief Complaint  Patient presents with  . Left Wrist - Fracture, Pain      HPI: [name] comes in she is now 5 weeks status post left distal radius fracture with impaction intra-articular.  At her last visit she was placed in a short arm cast.  She said she had to take her cast off a couple weeks ago because it was cutting into her thumb and she has a history of severe CMC arthritis.  It also caused some bleeding.  She has been wrapping her wrist and an Ace wrap since.  While her wrist is stiff she said most of her pain is focused over her [facility] for [facility] joint.  She knows she has severe arthritis and has had it injected before with relief she had been told she needed surgery but has declined at  Assessment & Plan: Visit Diagnoses:  1. Pain in left wrist     Plan: Explained to her she did have a little displacement of the fracture but it at 5 weeks we will continue to see how she does I offered her therapy she declined.  Will give a removable wrist splint she can come out and do range of motion and warm soaks on her wrist.  She is most bothered by her CMC arthritis we will go forward with an injection today  Follow-Up Instructions: No follow-ups on file.   Ortho Exam  Patient is alert, oriented, no adenopathy, well-dressed, normal affect, normal respiratory effort. Examination of her left wrist skin is intact she has a strong radial pulse brisk capillary refill she is able to move her fingers does have quite a bit of stiffness.  She is extremely tender over the first [facility] joint  Imaging: XR Wrist Complete Left Result Date: 04/02/2023 Radiographs of her left wrist demonstrate impacted intra-articular distal radius  fracture has had some shift since original x-rays.  No images are attached to the encounter.  Labs: Lab Results  Component Value Date   HGBA1C 7.9 (A) 02/06/2023   HGBA1C CANCELED 04/16/2022   HGBA1C 7.5 (H) 03/31/2022   ESRSEDRATE CANCELED 04/16/2022   ESRSEDRATE 36 (H) 03/31/2022   ESRSEDRATE 26 (H) 12/20/2015   LABURIC 4.7 10/29/2020   LABURIC 5.5 09/25/2016   LABURIC 5.9 09/24/2015   REPTSTATUS 04/08/2017 FINAL 04/03/2017   GRAMSTAIN NO WBC SEEN NO ORGANISMS SEEN  04/03/2017   CULT No growth aerobically or anaerobically. 04/03/2017     Lab Results  Component Value Date   ALBUMIN 4.0 02/06/2023   ALBUMIN 3.6 08/05/2021   ALBUMIN 3.8 10/29/2020    Lab Results  Component Value Date   MG 1.9 12/03/2016   MG 2.0 12/20/2015   Lab Results  Component Value Date   VD25OH 23 (L) 11/20/2020   VD25OH 20 (L) 01/08/2018   VD25OH 24.2 (L) 09/25/2016    No results found for: "PREALBUMIN"    Latest Ref Rng & Units 02/06/2023    5:16 PM 04/16/2022    3:38 PM 03/31/2022    3:23 PM  CBC EXTENDED  WBC 3.4 - 10.8  x10E3/uL 7.2  CANCELED  6.8   RBC 3.77 - 5.28 x10E6/uL 5.09   5.05   Hemoglobin 11.1 - 15.9 g/dL 78.4   69.6   HCT 29.5 - 46.6 % 44.1   41.7   Platelets 150 - 450 x10E3/uL 241   249      There is no height or weight on file to calculate BMI.  Orders:  Orders Placed This Encounter  Procedures  . XR Wrist Complete Left   No orders of the defined types were placed in this encounter.    Procedures: Small Joint Inj: L thumb CMC on 04/02/2023 3:01 PM Details: lateral approach Medications: 0.5 mL lidocaine 1 %  After obtaining verbal consent for [facility] joint was prepped with alcohol and 1 cc of lidocaine and half cc of betamethasone were injected without difficulty Band-Aid was applied   Clinical Data: No additional findings.  ROS:  All other systems negative, except as noted in the HPI. Review of Systems  Objective: Vital Signs: There were no vitals  taken for this visit.  Specialty Comments:  No specialty comments available.  PMFS History: Patient Active Problem List   Diagnosis Date Noted  . Pain and swelling of right lower extremity 07/07/2022  . Vertigo 06/09/2022  . Chronic right shoulder pain 04/28/2022  . Urinary frequency 03/31/2022  . Benign essential hypertension 03/31/2022  . Abdominal pain 08/20/2021  . Arthritis of carpometacarpal [facility]) joint of right thumb 04/25/2021  . Sinusitis 01/14/2021  . Annual physical exam 11/13/2020  . Panniculitis 11/13/2020  . Macromastia 11/13/2020  . Right ovarian cyst 07/27/2020  . Status post total replacement of left hip 02/17/2020  . Unilateral primary osteoarthritis, left hip 02/16/2020  . Diarrhea   . B12 deficiency 01/08/2018  . History of pulmonary embolism 01/08/2018  . Status post total left knee replacement 10/13/2017  . Family history of thyroid disease 08/25/2017  . Chronic pain of left knee 05/20/2017  . Unilateral primary osteoarthritis, left knee 05/20/2017  . Morbid obesity (HCC) 04/30/2017  . Family history of ovarian cancer 02/17/2017  . IBS (irritable bowel syndrome) 01/21/2017  . Dyspnea 10/08/2016  . Paroxysmal atrial fibrillation (HCC) 06/18/2016  . Type 2 diabetes mellitus without complication, without long-term current use of insulin (HCC) 01/21/2016  . H/O Spinal surgery 12/21/2015  . Barrett's esophagus 12/21/2015  . Hx of adenomatous polyp of colon 03/01/2002   Past Medical History:  Diagnosis Date  . Abnormal thyroid function test   . Allergy   . Anemia    she attributes previously to fibroids, she declines  . Barrett's esophagus   . Blood transfusion without reported diagnosis   . DDD (degenerative disc disease), cervical   . DJD (degenerative joint disease)   . Dysrhythmia   . GERD (gastroesophageal reflux disease)   . Heart murmur   . History of bronchitis   . History of hiatal hernia   . Hx of adenomatous polyp of colon 03/01/2002   . Myocardial infarction (HCC)   . OA (osteoarthritis)   . Obese   . Paroxysmal atrial fibrillation (HCC)   . PE (pulmonary embolism) 12/21/2015  . Plantar fasciitis   . Pneumonia   . PONV (postoperative nausea and vomiting)    pt. reports that she woke up during surgery  . Pre-diabetes   . Pulmonary embolism (HCC)   . Wrist fracture     Family History  Problem Relation Age of Onset  . Stroke Maternal Grandmother   . Bladder Cancer Maternal  Grandmother 50  . Esophageal cancer Maternal Grandmother 50  . Hyperthyroidism Mother        Radioactive Iodine Treatment  . Hypothyroidism Mother   . Cervical cancer Maternal Aunt 50  . Ovarian cancer Daughter 39  . Colon cancer Neg Hx   . Pancreatic cancer Neg Hx   . Rectal cancer Neg Hx   . Stomach cancer Neg Hx   . Diabetes Neg Hx     Past Surgical History:  Procedure Laterality Date  . ABDOMINAL HYSTERECTOMY    . Arthroscopic surgery left knee    . BACK SURGERY     2005 ,  2017  . BIOPSY  07/26/2019   Procedure: BIOPSY;  Surgeon: Iva Boop, MD;  [facility]: [facility];  Service: Endoscopy;;  . CESAREAN SECTION    . CHOLECYSTECTOMY    . COLONOSCOPY  2003, 2011   3 mm adenoma 2011  . COLONOSCOPY WITH PROPOFOL N/A 07/26/2019   Procedure: COLONOSCOPY WITH PROPOFOL;  Surgeon: Iva Boop, MD;  [facility];  Service: Endoscopy;  Laterality: N/A;  . ESOPHAGOGASTRODUODENOSCOPY  multiple since 2003  . ESOPHAGOGASTRODUODENOSCOPY (EGD) WITH PROPOFOL N/A 07/26/2019   Procedure: ESOPHAGOGASTRODUODENOSCOPY (EGD) WITH PROPOFOL;  Surgeon: Iva Boop, MD;  [facility];  Service: Endoscopy;  Laterality: N/A;  . IR GENERIC HISTORICAL  12/19/2015   IR ANGIOGRAM SELECTIVE EACH ADDITIONAL VESSEL 12/19/2015 [name], MD [facility]  . IR GENERIC HISTORICAL  12/19/2015   IR ANGIOGRAM PULMONARY BILATERAL SELECTIVE 12/19/2015 [name], MD [facility]  . IR GENERIC HISTORICAL  12/19/2015   IR INFUSION  THROMBOL ARTERIAL INITIAL (MS) 12/19/2015 [name], MD [facility]  . IR GENERIC HISTORICAL  12/19/2015   IR US GUIDE VASC ACCESS RIGHT 12/19/2015 [name], MD [facility]  . IR GENERIC HISTORICAL  12/19/2015   IR ANGIOGRAM SELECTIVE EACH ADDITIONAL VESSEL 12/19/2015 [name], MD [facility]  . IR GENERIC HISTORICAL  12/20/2015   IR THROMB F/U EVAL ART/VEN FINAL DAY (MS) 12/20/2015 Gilmer Mor, DO [facility]  . IR GENERIC HISTORICAL  12/19/2015   IR INFUSION THROMBOL ARTERIAL INITIAL (MS) 12/19/2015 [name], MD [facility]  . IR RADIOLOGIST EVAL & MGMT  09/19/2020  . PLANTAR FASCIA RELEASE     right  . POLYPECTOMY  07/26/2019   Procedure: POLYPECTOMY;  Surgeon: Iva Boop, MD;  [facility];  Service: Endoscopy;;  . PULMONARY EMBOLISM SURGERY    . Right knee replacement    . SPINAL FUSION  10/31/2015   revision at [facility]   . TONSILLECTOMY    . TOTAL HIP ARTHROPLASTY Left 02/17/2020   Procedure: LEFT TOTAL HIP ARTHROPLASTY ANTERIOR APPROACH;  Surgeon: Kathryne Hitch, MD;  [facility];  Service: Orthopedics;  Laterality: Left;  . TOTAL KNEE ARTHROPLASTY Left 10/13/2017   Procedure: LEFT TOTAL KNEE ARTHROPLASTY;  Surgeon: Kathryne Hitch, MD;  [facility];  Service: Orthopedics;  Laterality: Left;  . ULNAR TUNNEL RELEASE     right  . VENOUS ABLATION     x 2   Social History   Occupational History    Comment: Architect  . Occupation: Retired.  Tobacco Use  . Smoking status: Never  . Smokeless tobacco: Never  Vaping Use  . Vaping status: Never Used  Substance and Sexual Activity  . Alcohol use: No  . Drug use: No  . Sexual activity: Not Currently    Partners: Male    Birth control/protection: Surgical  Comment: Hysterectomy

## 2023-06-07 ENCOUNTER — Other Ambulatory Visit: Payer: Self-pay | Admitting: Sports Medicine

## 2023-06-07 DIAGNOSIS — E119 Type 2 diabetes mellitus without complications: Secondary | ICD-10-CM

## 2023-08-06 NOTE — Progress Notes (Signed)
 [facility] Quality Team Note  Name: [name] Date of Birth: 1950-09-05 MRN: [id number] Date: 08/06/2023  [facility] Quality Team has reviewed this patient's chart, please see recommendations below:  [facility] Quality Other; (Patient due for A1c, urine microalbumin/creatinine ratio, eGFR and diabetic eye exam. Patient needs appt with pcp to complete and address)

## 2023-08-13 NOTE — Progress Notes (Signed)
 I called patient and she states she has been unable to have the eye exam. She reports her husband is having surgery and is unable to go at this time.

## 2023-10-17 ENCOUNTER — Encounter (HOSPITAL_COMMUNITY): Payer: Self-pay | Admitting: Interventional Radiology

## 2023-10-19 ENCOUNTER — Other Ambulatory Visit: Payer: Self-pay | Admitting: Sports Medicine

## 2023-10-28 ENCOUNTER — Other Ambulatory Visit: Payer: Self-pay | Admitting: Sports Medicine

## 2023-12-22 ENCOUNTER — Encounter: Payer: Self-pay | Admitting: Sports Medicine

## 2024-01-13 NOTE — Progress Notes (Signed)
 [name]                                          MRN: [id number]   01/13/2024   The [facility] Quality Team Specialist reviewed this patient medical record for the purposes of chart review for care gap closure. The following were reviewed: chart review for care gap closure-glycemic status assessment.    [facility] Quality Team

## 2024-01-21 ENCOUNTER — Other Ambulatory Visit: Payer: Self-pay | Admitting: Family Medicine

## 2024-01-21 DIAGNOSIS — Z1231 Encounter for screening mammogram for malignant neoplasm of breast: Secondary | ICD-10-CM

## 2024-01-28 NOTE — Progress Notes (Signed)
 [name]                                          MRN: [id number]   01/28/2024   The [facility] Quality Team Specialist reviewed this patient medical record for the purposes of chart review for care gap closure. The following were reviewed: chart review for care gap closure-glycemic status assessment.    [facility] Quality Team

## 2024-02-02 ENCOUNTER — Encounter: Admitting: Physician Assistant

## 2024-02-15 ENCOUNTER — Other Ambulatory Visit

## 2024-02-15 ENCOUNTER — Ambulatory Visit: Admitting: Physician Assistant

## 2024-02-15 ENCOUNTER — Encounter: Payer: Self-pay | Admitting: Physician Assistant

## 2024-02-15 ENCOUNTER — Other Ambulatory Visit (INDEPENDENT_AMBULATORY_CARE_PROVIDER_SITE_OTHER)

## 2024-02-15 DIAGNOSIS — M7061 Trochanteric bursitis, right hip: Secondary | ICD-10-CM

## 2024-02-15 DIAGNOSIS — M79672 Pain in left foot: Secondary | ICD-10-CM

## 2024-02-15 NOTE — Progress Notes (Signed)
 Office Visit Note   Patient: [name]           Date of Birth: [birth year]           MRN: [id number] Visit Date: 02/15/2024              Requested by: Curtis Debby PARAS, MD No address on file PCP: Curtis Debby PARAS, MD (Inactive)   Assessment & Plan: Visit Diagnoses:  1. Trochanteric bursitis of right hip   2. Pain in left foot     Plan: Regards to her left foot recommend orthotic and stiff supportive shoes.  Also recommend Voltaren gel over the area of maximal tenderness which is around the CC joint left foot.  Regards to the right hip she was given a trochanteric injection today.  She will follow-up with us  if pain persist or becomes worse.  Follow-Up Instructions: Return if symptoms worsen or fail to improve.   Orders:  Orders Placed This Encounter  Procedures   XR HIP UNILAT W OR W/O PELVIS 2-3 VIEWS RIGHT   XR Foot Complete Left   No orders of the defined types were placed in this encounter.     Procedures: No procedures performed   Clinical Data: No additional findings.   Subjective: Chief Complaint  Patient presents with   Right Hip - Pain   Left Foot - Pain    HPI [name] comes in today due to right hip pain for the last 3 to 4 weeks no known injury.  Tenderness over the lateral aspect of the hip.  She has pain whenever she lies on the right hip.  She states anything touching the side of her hip including the arm of the chair makes pain worse.  She is using a cane due to her balance.  She is also having left foot pain has been ongoing for months she is questioning if she has a stress fracture.  She states it felt like she has a splinter in her foot points to the lateral aspect of the foot.  No particular injury.  She is diabetic last hemoglobin A1c on 02/06/2024 was 7.9. Review of Systems Negative for fevers chills  Objective: Vital Signs: There were no vitals taken for this visit.  Physical Exam General: Well-developed  well-nourished female in no acute distress.  Uses cane to ambulate. Psych: Alert and oriented x 3 Respirations: Unlabored  Ortho Exam Left foot: No impending ulcers.  Positive for peds tinea no punctate wounds.  No drainage.  Tenderness left foot over the CC joint.  She has 5 out of 5 strength with eversion left foot against resistance but this causes pain. Bilateral hips: Good range of motion of both hips without pain.  Tenderness over the right hip trochanteric region.  Specialty Comments:  No specialty comments available.  Imaging: XR Foot Complete Left Result Date: 02/15/2024 Left foot 3 views shows all 5 TMT joints with arthritic changes.  Hallux rigidus present.  No acute fractures.  Os peroneus single lateral foot.  XR HIP UNILAT W OR W/O PELVIS 2-3 VIEWS RIGHT Result Date: 02/15/2024 AP pelvis lateral view right hip: Right hip overall well-preserved some mild arthritic changes with distal periarticular spurring.  No acute fractures.  Status post left total hip arthroplasty well-seated components.    PMFS History: Patient Active Problem List   Diagnosis Date Noted   Pain and swelling of right lower extremity 07/07/2022   Vertigo 06/09/2022   Chronic right shoulder pain 04/28/2022  Urinary frequency 03/31/2022   Benign essential hypertension 03/31/2022   Abdominal pain 08/20/2021   Arthritis of carpometacarpal [facility]) joint of right thumb 04/25/2021   Sinusitis 01/14/2021   Annual physical exam 11/13/2020   Panniculitis 11/13/2020   Macromastia 11/13/2020   Right ovarian cyst 07/27/2020   Status post total replacement of left hip 02/17/2020   Unilateral primary osteoarthritis, left hip 02/16/2020   Diarrhea    B12 deficiency 01/08/2018   History of pulmonary embolism 01/08/2018   Status post total left knee replacement 10/13/2017   Family history of thyroid  disease 08/25/2017   Chronic pain of left knee 05/20/2017   Unilateral primary osteoarthritis, left knee  05/20/2017   Morbid obesity (HCC) 04/30/2017   Family history of ovarian cancer 02/17/2017   IBS (irritable bowel syndrome) 01/21/2017   Dyspnea 10/08/2016   Paroxysmal atrial fibrillation (HCC) 06/18/2016   Type 2 diabetes mellitus without complication, without long-term current use of insulin  (HCC) 01/21/2016   H/O Spinal surgery 12/21/2015   Barrett's esophagus 12/21/2015   Hx of adenomatous polyp of colon 03/01/2002   Past Medical History:  Diagnosis Date   Abnormal thyroid  function test    Allergy    Anemia    she attributes previously to fibroids, she declines   Barrett's esophagus    Blood transfusion without reported diagnosis    DDD (degenerative disc disease), cervical    DJD (degenerative joint disease)    Dysrhythmia    GERD (gastroesophageal reflux disease)    Heart murmur    History of bronchitis    History of hiatal hernia    Hx of adenomatous polyp of colon 03/01/2002   Myocardial infarction (HCC)    OA (osteoarthritis)    Obese    Paroxysmal atrial fibrillation (HCC)    PE (pulmonary embolism) 12/21/2015   Plantar fasciitis    Pneumonia    PONV (postoperative nausea and vomiting)    pt. reports that she woke up during surgery   Pre-diabetes    Pulmonary embolism (HCC)    Wrist fracture     Family History  Problem Relation Age of Onset   Stroke Maternal Grandmother    Bladder Cancer Maternal Grandmother 71   Esophageal cancer Maternal Grandmother 60   Hyperthyroidism Mother        Radioactive Iodine  Treatment   Hypothyroidism Mother    Cervical cancer Maternal Aunt 39   Ovarian cancer Daughter 1   Colon cancer Neg Hx    Pancreatic cancer Neg Hx    Rectal cancer Neg Hx    Stomach cancer Neg Hx    Diabetes Neg Hx     Past Surgical History:  Procedure Laterality Date   ABDOMINAL HYSTERECTOMY     Arthroscopic surgery left knee     BACK SURGERY     2005 ,  2017   BIOPSY  07/26/2019   Procedure: BIOPSY;  Surgeon: Avram Lupita BRAVO, MD;   [facility];  Service: Endoscopy;;   CESAREAN SECTION     CHOLECYSTECTOMY     COLONOSCOPY  2003, 2011   3 mm adenoma 2011   COLONOSCOPY WITH PROPOFOL  N/A 07/26/2019   Procedure: COLONOSCOPY WITH PROPOFOL ;  Surgeon: Avram Lupita BRAVO, MD;  [facility];  Service: Endoscopy;  Laterality: N/A;   ESOPHAGOGASTRODUODENOSCOPY  multiple since 2003   ESOPHAGOGASTRODUODENOSCOPY (EGD) WITH PROPOFOL  N/A 07/26/2019   Procedure: ESOPHAGOGASTRODUODENOSCOPY (EGD) WITH PROPOFOL ;  Surgeon: Avram Lupita BRAVO, MD;  [facility];  Service: Endoscopy;  Laterality: N/A;  IR GENERIC HISTORICAL  12/19/2015   IR ANGIOGRAM SELECTIVE EACH ADDITIONAL VESSEL 12/19/2015 Ozell Specking, MD [facility]   IR GENERIC HISTORICAL  12/19/2015   IR ANGIOGRAM PULMONARY BILATERAL SELECTIVE 12/19/2015 Ozell Specking, MD [facility]   IR GENERIC HISTORICAL  12/19/2015   IR INFUSION THROMBOL ARTERIAL INITIAL (MS) 12/19/2015 Ozell Specking, MD [facility]   IR GENERIC HISTORICAL  12/19/2015   IR US  GUIDE VASC ACCESS RIGHT 12/19/2015 Ozell Specking, MD [facility]   IR GENERIC HISTORICAL  12/19/2015   IR [facility] SELECTIVE EACH ADDITIONAL VESSEL 12/19/2015 Ozell Specking, MD [facility]   IR GENERIC HISTORICAL  12/20/2015   IR THROMB F/U EVAL ART/VEN FINAL DAY (MS) 12/20/2015 Ami Bellman, DO [facility]   IR GENERIC HISTORICAL  12/19/2015   IR INFUSION THROMBOL ARTERIAL INITIAL (MS) 12/19/2015 Ozell Specking, MD [facility]   IR RADIOLOGIST EVAL & MGMT  09/19/2020   PLANTAR FASCIA RELEASE     right   POLYPECTOMY  07/26/2019   Procedure: POLYPECTOMY;  Surgeon: Avram Lupita BRAVO, MD;  [facility];  Service: Endoscopy;;   PULMONARY EMBOLISM SURGERY     Right knee replacement     SPINAL FUSION  10/31/2015   revision at [facility]    TONSILLECTOMY     TOTAL HIP ARTHROPLASTY Left 02/17/2020   Procedure: LEFT TOTAL HIP ARTHROPLASTY ANTERIOR APPROACH;  Surgeon: Vernetta Lonni GRADE,  MD;  [facility];  Service: Orthopedics;  Laterality: Left;   TOTAL KNEE ARTHROPLASTY Left 10/13/2017   Procedure: LEFT TOTAL KNEE ARTHROPLASTY;  Surgeon: Vernetta Lonni GRADE, MD;  [facility];  Service: Orthopedics;  Laterality: Left;   ULNAR TUNNEL RELEASE     right   VENOUS ABLATION     x 2   Social History   Occupational History    Comment: Architect   Occupation: Retired.  Tobacco Use   Smoking status: Never   Smokeless tobacco: Never  Vaping Use   Vaping status: Never Used  Substance and Sexual Activity   Alcohol use: No   Drug use: No   Sexual activity: Not Currently    Partners: Male    Birth control/protection: Surgical    Comment: Hysterectomy

## 2024-02-22 ENCOUNTER — Encounter: Payer: Self-pay | Admitting: Radiology

## 2024-02-22 ENCOUNTER — Ambulatory Visit
Admission: RE | Admit: 2024-02-22 | Discharge: 2024-02-22 | Disposition: A | Source: Ambulatory Visit | Attending: Family Medicine

## 2024-02-22 DIAGNOSIS — Z1231 Encounter for screening mammogram for malignant neoplasm of breast: Secondary | ICD-10-CM

## 2024-02-24 ENCOUNTER — Ambulatory Visit: Payer: Self-pay | Admitting: Family Medicine

## 2024-02-24 NOTE — Progress Notes (Signed)
 Please call patient. Normal mammogram.  Repeat in 1 year.

## 2024-03-14 NOTE — Progress Notes (Signed)
 [name]                                          MRN: [id number]   03/14/2024   The [facility] Quality Team Specialist reviewed this patient medical record for the purposes of chart review for care gap closure. The following were reviewed: chart review for care gap closure-kidney health evaluation for diabetes:eGFR  and uACR.    [facility] Quality Team

## 2024-03-28 NOTE — Progress Notes (Signed)
 SERI KIMMER                                          MRN: [id number]   03/28/2024   The [facility] Quality Team Specialist reviewed this patient medical record for the purposes of chart review for care gap closure. The following were reviewed: chart review for care gap closure-glycemic status assessment and kidney health evaluation for diabetes:eGFR  and uACR.    [facility] Quality Team

## 2024-04-05 NOTE — Progress Notes (Signed)
 [name]                                          MRN: [id number]   04/05/2024   The [facility] Quality Team Specialist reviewed this patient medical record for the purposes of chart review for care gap closure. The following were reviewed: chart review for care gap closure-glycemic status assessment and kidney health evaluation for diabetes:eGFR  and uACR.    [facility] Quality Team

## 2024-04-05 NOTE — Progress Notes (Signed)
 JORA GALLUZZO                                          MRN: [id number]   04/05/2024   The [facility] Quality Team Specialist reviewed this patient medical record for the purposes of chart review for care gap closure. The following were reviewed: chart review for care gap closure-kidney health evaluation for diabetes:eGFR  and uACR.    [facility] Quality Team

## 2024-05-13 NOTE — Progress Notes (Signed)
 [name]                                          MRN: [id number]   05/13/2024   The [facility] Quality Team Specialist reviewed this patient medical record for the purposes of chart review for care gap closure. The following were reviewed: chart review for care gap closure-glycemic status assessment.    [facility] Quality Team

## 2024-05-20 NOTE — Progress Notes (Signed)
 [name]                                          MRN: [id number]   05/20/2024   The [facility] Quality Team Specialist reviewed this patient medical record for the purposes of chart review for care gap closure. The following were reviewed: chart review for care gap closure-glycemic status assessment.    [facility] Quality Team
